# Patient Record
Sex: Female | Born: 1940 | Race: White | Hispanic: No | Marital: Married | Smoking: Never smoker
Health system: Southern US, Community
[De-identification: ages and names within clinical notes are randomized; demographics above are authoritative.]

## PROBLEM LIST (undated history)

## (undated) DIAGNOSIS — I10 Essential (primary) hypertension: Secondary | ICD-10-CM

## (undated) DIAGNOSIS — C439 Malignant melanoma of skin, unspecified: Secondary | ICD-10-CM

## (undated) DIAGNOSIS — Q8502 Neurofibromatosis, type 2: Secondary | ICD-10-CM

## (undated) DIAGNOSIS — E785 Hyperlipidemia, unspecified: Secondary | ICD-10-CM

## (undated) DIAGNOSIS — D333 Benign neoplasm of cranial nerves: Secondary | ICD-10-CM

## (undated) DIAGNOSIS — Z8619 Personal history of other infectious and parasitic diseases: Secondary | ICD-10-CM

## (undated) HISTORY — PX: COLON SURGERY: SHX602

## (undated) HISTORY — DX: Malignant melanoma of skin, unspecified: C43.9

## (undated) HISTORY — PX: GYNECOLOGIC CRYOSURGERY: SHX857

## (undated) HISTORY — PX: FACIAL COSMETIC SURGERY: SHX629

## (undated) HISTORY — DX: Essential (primary) hypertension: I10

## (undated) HISTORY — PX: COLONOSCOPY: SHX174

---

## 1979-02-16 HISTORY — PX: OTHER SURGICAL HISTORY: SHX169

## 1988-02-16 HISTORY — PX: BREAST ENHANCEMENT SURGERY: SHX7

## 1997-08-15 ENCOUNTER — Other Ambulatory Visit: Admission: RE | Admit: 1997-08-15 | Discharge: 1997-08-15 | Payer: Self-pay | Admitting: *Deleted

## 1998-08-21 ENCOUNTER — Other Ambulatory Visit: Admission: RE | Admit: 1998-08-21 | Discharge: 1998-08-21 | Payer: Self-pay | Admitting: *Deleted

## 1999-12-17 ENCOUNTER — Other Ambulatory Visit: Admission: RE | Admit: 1999-12-17 | Discharge: 1999-12-17 | Payer: Self-pay | Admitting: *Deleted

## 2001-03-23 ENCOUNTER — Other Ambulatory Visit: Admission: RE | Admit: 2001-03-23 | Discharge: 2001-03-23 | Payer: Self-pay | Admitting: *Deleted

## 2002-05-31 ENCOUNTER — Other Ambulatory Visit: Admission: RE | Admit: 2002-05-31 | Discharge: 2002-05-31 | Payer: Self-pay | Admitting: *Deleted

## 2002-06-14 ENCOUNTER — Encounter: Payer: Self-pay | Admitting: *Deleted

## 2002-06-14 ENCOUNTER — Encounter: Admission: RE | Admit: 2002-06-14 | Discharge: 2002-06-14 | Payer: Self-pay | Admitting: *Deleted

## 2003-06-27 ENCOUNTER — Other Ambulatory Visit: Admission: RE | Admit: 2003-06-27 | Discharge: 2003-06-27 | Payer: Self-pay | Admitting: *Deleted

## 2003-07-17 ENCOUNTER — Encounter: Admission: RE | Admit: 2003-07-17 | Discharge: 2003-07-17 | Payer: Self-pay | Admitting: *Deleted

## 2004-08-20 ENCOUNTER — Other Ambulatory Visit: Admission: RE | Admit: 2004-08-20 | Discharge: 2004-08-20 | Payer: Self-pay | Admitting: *Deleted

## 2004-09-09 ENCOUNTER — Encounter: Admission: RE | Admit: 2004-09-09 | Discharge: 2004-09-09 | Payer: Self-pay | Admitting: *Deleted

## 2005-11-04 ENCOUNTER — Other Ambulatory Visit: Admission: RE | Admit: 2005-11-04 | Discharge: 2005-11-04 | Payer: Self-pay | Admitting: *Deleted

## 2005-12-10 ENCOUNTER — Encounter: Admission: RE | Admit: 2005-12-10 | Discharge: 2005-12-10 | Payer: Self-pay | Admitting: *Deleted

## 2006-02-15 DIAGNOSIS — C439 Malignant melanoma of skin, unspecified: Secondary | ICD-10-CM

## 2006-02-15 HISTORY — DX: Malignant melanoma of skin, unspecified: C43.9

## 2006-12-29 ENCOUNTER — Other Ambulatory Visit: Admission: RE | Admit: 2006-12-29 | Discharge: 2006-12-29 | Payer: Self-pay | Admitting: *Deleted

## 2007-02-28 ENCOUNTER — Encounter: Admission: RE | Admit: 2007-02-28 | Discharge: 2007-02-28 | Payer: Self-pay | Admitting: *Deleted

## 2008-01-04 ENCOUNTER — Other Ambulatory Visit: Admission: RE | Admit: 2008-01-04 | Discharge: 2008-01-04 | Payer: Self-pay | Admitting: Gynecology

## 2008-06-18 ENCOUNTER — Encounter: Admission: RE | Admit: 2008-06-18 | Discharge: 2008-06-18 | Payer: Self-pay | Admitting: Internal Medicine

## 2009-02-17 LAB — HM PAP SMEAR: HM Pap smear: NORMAL

## 2009-06-20 LAB — HM COLONOSCOPY: HM Colonoscopy: ABNORMAL

## 2009-06-25 LAB — HM MAMMOGRAPHY: HM Mammogram: NORMAL

## 2009-07-02 ENCOUNTER — Encounter: Admission: RE | Admit: 2009-07-02 | Discharge: 2009-07-02 | Payer: Self-pay | Admitting: Internal Medicine

## 2010-09-22 ENCOUNTER — Encounter: Payer: Self-pay | Admitting: Internal Medicine

## 2010-10-31 ENCOUNTER — Encounter: Payer: Self-pay | Admitting: Internal Medicine

## 2011-03-02 ENCOUNTER — Other Ambulatory Visit: Payer: Self-pay | Admitting: *Deleted

## 2011-03-02 NOTE — Telephone Encounter (Signed)
Faxed requests from [facility] [address]  Pt has not been seen here and she doesn't have any upcoming appts.

## 2011-03-03 MED ORDER — TRIAMTERENE-HCTZ 37.5-25 MG PO TABS: 1.0000 | ORAL_TABLET | Freq: Every day | ORAL | Status: DC

## 2011-03-03 MED ORDER — LOSARTAN POTASSIUM 100 MG PO TABS: 100.0000 mg | ORAL_TABLET | Freq: Every day | ORAL | Status: DC

## 2011-04-22 ENCOUNTER — Telehealth: Payer: Self-pay | Admitting: *Deleted

## 2011-04-22 NOTE — Telephone Encounter (Signed)
Patient is asking if she can get an order for a bone density faxed to [place]. She says that you had recommended that she have this done last year, but she never went.

## 2011-04-22 NOTE — Telephone Encounter (Signed)
I will, but I ask that she schedule a visit first to discuss the results and followup on her hypertension . She has not been seen here , which means she is overdue for labs and 6 month followup.

## 2011-04-26 NOTE — Telephone Encounter (Signed)
Left message asking patient to return my call.

## 2011-05-06 NOTE — Telephone Encounter (Signed)
Left message asking patient to call and schedule appt.

## 2011-06-10 ENCOUNTER — Encounter: Payer: Self-pay | Admitting: Internal Medicine

## 2011-06-11 LAB — HM DEXA SCAN

## 2011-08-24 ENCOUNTER — Telehealth: Payer: Self-pay | Admitting: Internal Medicine

## 2011-08-24 NOTE — Telephone Encounter (Signed)
Per note back in march Dr. Darrick Huntsman wants to see patient before the bone density. Patient advised and has made appt

## 2011-08-24 NOTE — Telephone Encounter (Signed)
Can patient go ahead and get bone density scan set up?

## 2011-08-30 ENCOUNTER — Other Ambulatory Visit: Payer: Self-pay | Admitting: *Deleted

## 2011-08-30 DIAGNOSIS — H524 Presbyopia: Secondary | ICD-10-CM | POA: Diagnosis not present

## 2011-08-30 DIAGNOSIS — H2510 Age-related nuclear cataract, unspecified eye: Secondary | ICD-10-CM | POA: Diagnosis not present

## 2011-08-31 MED ORDER — PROMETHAZINE HCL 12.5 MG PO TABS: 12.5000 mg | ORAL_TABLET | Freq: Four times a day (QID) | ORAL | Status: DC | PRN

## 2011-09-06 DIAGNOSIS — H2510 Age-related nuclear cataract, unspecified eye: Secondary | ICD-10-CM | POA: Diagnosis not present

## 2011-09-06 DIAGNOSIS — H18419 Arcus senilis, unspecified eye: Secondary | ICD-10-CM | POA: Diagnosis not present

## 2011-10-04 DIAGNOSIS — Z961 Presence of intraocular lens: Secondary | ICD-10-CM | POA: Diagnosis not present

## 2011-10-04 DIAGNOSIS — H2510 Age-related nuclear cataract, unspecified eye: Secondary | ICD-10-CM | POA: Diagnosis not present

## 2011-10-04 DIAGNOSIS — H269 Unspecified cataract: Secondary | ICD-10-CM | POA: Diagnosis not present

## 2011-10-05 DIAGNOSIS — H2510 Age-related nuclear cataract, unspecified eye: Secondary | ICD-10-CM | POA: Diagnosis not present

## 2011-10-13 ENCOUNTER — Other Ambulatory Visit (HOSPITAL_COMMUNITY)
Admission: RE | Admit: 2011-10-13 | Discharge: 2011-10-13 | Disposition: A | Discharge status: Home / Self Care | Payer: Medicare Other | Source: Ambulatory Visit | Attending: Internal Medicine | Admitting: Internal Medicine

## 2011-10-13 ENCOUNTER — Encounter: Payer: Self-pay | Admitting: Internal Medicine

## 2011-10-13 ENCOUNTER — Ambulatory Visit (INDEPENDENT_AMBULATORY_CARE_PROVIDER_SITE_OTHER): Payer: Medicare Other | Admitting: Internal Medicine

## 2011-10-13 VITALS — BP 120/62 | HR 72 | Temp 97.8°F | Resp 14 | Ht 63.0 in | Wt 128.8 lb

## 2011-10-13 DIAGNOSIS — E785 Hyperlipidemia, unspecified: Secondary | ICD-10-CM | POA: Diagnosis not present

## 2011-10-13 DIAGNOSIS — Z1151 Encounter for screening for human papillomavirus (HPV): Secondary | ICD-10-CM | POA: Diagnosis not present

## 2011-10-13 DIAGNOSIS — R5381 Other malaise: Secondary | ICD-10-CM | POA: Diagnosis not present

## 2011-10-13 DIAGNOSIS — R5383 Other fatigue: Secondary | ICD-10-CM

## 2011-10-13 DIAGNOSIS — Z124 Encounter for screening for malignant neoplasm of cervix: Secondary | ICD-10-CM

## 2011-10-13 DIAGNOSIS — Z13820 Encounter for screening for osteoporosis: Secondary | ICD-10-CM

## 2011-10-13 DIAGNOSIS — Z01419 Encounter for gynecological examination (general) (routine) without abnormal findings: Secondary | ICD-10-CM | POA: Diagnosis not present

## 2011-10-13 DIAGNOSIS — Z0000 Encounter for general adult medical examination without abnormal findings: Secondary | ICD-10-CM | POA: Diagnosis not present

## 2011-10-13 DIAGNOSIS — E559 Vitamin D deficiency, unspecified: Secondary | ICD-10-CM

## 2011-10-13 DIAGNOSIS — M810 Age-related osteoporosis without current pathological fracture: Secondary | ICD-10-CM

## 2011-10-13 DIAGNOSIS — Z1239 Encounter for other screening for malignant neoplasm of breast: Secondary | ICD-10-CM | POA: Diagnosis not present

## 2011-10-13 DIAGNOSIS — E871 Hypo-osmolality and hyponatremia: Secondary | ICD-10-CM

## 2011-10-13 DIAGNOSIS — E860 Dehydration: Secondary | ICD-10-CM

## 2011-10-13 DIAGNOSIS — Z23 Encounter for immunization: Secondary | ICD-10-CM

## 2011-10-13 LAB — COMPREHENSIVE METABOLIC PANEL
ALT: 13 U/L (ref 0–35)
AST: 22 U/L (ref 0–37)
Albumin: 4.3 g/dL (ref 3.5–5.2)
Alkaline Phosphatase: 50 U/L (ref 39–117)
BUN: 11 mg/dL (ref 6–23)
CO2: 27 mEq/L (ref 19–32)
Calcium: 9.1 mg/dL (ref 8.4–10.5)
Chloride: 91 mEq/L — ABNORMAL LOW (ref 96–112)
Creatinine, Ser: 0.7 mg/dL (ref 0.4–1.2)
GFR: 88.91 mL/min (ref >60.00)
Glucose, Bld: 84 mg/dL (ref 70–99)
Potassium: 3.7 mEq/L (ref 3.5–5.1)
Sodium: 128 mEq/L — ABNORMAL LOW (ref 135–145)
Total Bilirubin: 0.9 mg/dL (ref 0.3–1.2)
Total Protein: 6.9 g/dL (ref 6.0–8.3)

## 2011-10-13 LAB — LDL CHOLESTEROL, DIRECT: Direct LDL: 147.2 mg/dL

## 2011-10-13 LAB — TSH: TSH: 0.49 u[IU]/mL (ref 0.35–5.50)

## 2011-10-13 LAB — LIPID PANEL
Cholesterol: 242 mg/dL — ABNORMAL HIGH (ref 0–200)
HDL: 90 mg/dL (ref >39.00)
Total CHOL/HDL Ratio: 3
Triglycerides: 61 mg/dL (ref 0.0–149.0)
VLDL: 12.2 mg/dL (ref 0.0–40.0)

## 2011-10-13 NOTE — Progress Notes (Signed)
Patient ID: Rita Taylor, female   DOB: 1940-09-09, 71 y.o.   MRN: [id number] The patient is here for annual Medicare wellness examination and management of other chronic and acute problems.   The risk factors are reflected in the social history.  The roster of all physicians providing medical care to patient - is listed in the Snapshot section of the chart.  Activities of daily living:  The patient is 100% independent in all ADLs: dressing, toileting, feeding as well as independent mobility  Home safety : The patient has smoke detectors in the home. They wear seatbelts.  There are no firearms at home. There is no violence in the home.   There is no risks for hepatitis, STDs or HIV. There is no   history of blood transfusion. They have no travel history to infectious disease endemic areas of the world.  The patient has seen their dentist in the last six month. They have seen their eye doctor in the last year. They admit to slight hearing difficulty with regard to whispered voices and some television programs.  They have deferred audiologic testing in the last year.  They do not  have excessive sun exposure. Discussed the need for sun protection: hats, long sleeves and use of sunscreen if there is significant sun exposure.   Diet: the importance of a healthy diet is discussed. They do have a healthy diet.  The benefits of regular aerobic exercise were discussed. She walks 4 times per week ,  20 minutes.   Depression screen: there are no signs or vegative symptoms of depression- irritability, change in appetite, anhedonia, sadness/tearfullness.  Cognitive assessment: the patient manages all their financial and personal affairs and is actively engaged. They could relate day,date,year and events; recalled 2/3 objects at 3 minutes; performed clock-face test normally.  The following portions of the patient's history were reviewed and updated as appropriate: allergies, current medications, past  family history, past medical history,  past surgical history, past social history  and problem list.  Visual acuity was not assessed per patient preference since she has regular follow up with her ophthalmologist. Hearing and body mass index were assessed and reviewed.   During the course of the visit the patient was educated and counseled about appropriate screening and preventive services including : fall prevention , diabetes screening, nutrition counseling, colorectal cancer screening, and recommended immunizations.     BP 120/62  Pulse 72  Temp 97.8 F (36.6 C) (Oral)  Resp 14  Ht 5\' 3"  (1.6 m)  Wt 128 lb 12 oz (58.401 kg)  BMI 22.81 kg/m2  SpO2 97%  General Appearance:    Alert, cooperative, no distress, appears stated age  Head:    Normocephalic, without obvious abnormality, atraumatic  Eyes:    PERRL, conjunctiva/corneas clear, EOM's intact, fundi    benign, both eyes  Ears:    Normal TM's and external ear canals, both ears  Nose:   Nares normal, septum midline, mucosa normal, no drainage    or sinus tenderness  Throat:   Lips, mucosa, and tongue normal; teeth and gums normal  Neck:   Supple, symmetrical, trachea midline, no adenopathy;    thyroid:  no enlargement/tenderness/nodules; no carotid   bruit or JVD  Back:     Symmetric, no curvature, ROM normal, no CVA tenderness  Lungs:     Clear to auscultation bilaterally, respirations unlabored  Chest Wall:    No tenderness or deformity   Heart:    Regular rate  and rhythm, S1 and S2 normal, no murmur, rub   or gallop  Breast Exam:    No tenderness, masses, or nipple abnormality  Abdomen:     Soft, non-tender, bowel sounds active all four quadrants,    no masses, no organomegaly  Genitalia:    Normal female without lesion, discharge or tenderness     Extremities:   Extremities normal, atraumatic, no cyanosis or edema  Pulses:   2+ and symmetric all extremities  Skin:   Skin color, texture, turgor normal, no rashes or  lesions  Lymph nodes:   Cervical, supraclavicular, and axillary nodes normal  Neurologic:   CNII-XII intact, normal strength, sensation and reflexes    throughout    Screening for cervical cancer Pelvic and Pap smear were done today. Exam was normal.  Routine general medical examination at a health care facility Her annual exam was done today and she was brought up to date on all screenings. Her lipid panel is excellen, thyroid function and vitamin D level was normal. She was slightly hyponatremic suggesting mild dehydration. I will have her come back in a week or so when well hydrated to make sure this has resolved.    Updated Medication List Outpatient Encounter Prescriptions as of 10/13/2011  Medication Sig Dispense Refill  . alendronate (FOSAMAX) 70 MG tablet Take 70 mg by mouth once a week. Take with a full glass of water on an empty stomach.       Marland Kitchen aspirin 81 MG tablet Take 81 mg by mouth daily.        . Calcium Carbonate-Vit D-Min (CALCIUM 1200) 1200-1000 MG-UNIT CHEW Chew 1 tablet by mouth daily.        Marland Kitchen losartan (COZAAR) 100 MG tablet Take 1 tablet (100 mg total) by mouth daily.  90 tablet  3  . triamterene-hydrochlorothiazide (MAXZIDE-25) 37.5-25 MG per tablet Take 1 each (1 tablet total) by mouth daily.  90 tablet  3  . DISCONTD: Inositol Niacinate (NIACIN FLUSH FREE) 500 MG CAPS Take 1 capsule by mouth daily.        Marland Kitchen DISCONTD: promethazine (PHENERGAN) 12.5 MG tablet Take 1 tablet (12.5 mg total) by mouth every 6 (six) hours as needed for nausea.  20 tablet  0

## 2011-10-13 NOTE — Patient Instructions (Addendum)
We will call you with resutls of your tests

## 2011-10-14 DIAGNOSIS — Z124 Encounter for screening for malignant neoplasm of cervix: Secondary | ICD-10-CM | POA: Insufficient documentation

## 2011-10-14 DIAGNOSIS — Z0000 Encounter for general adult medical examination without abnormal findings: Secondary | ICD-10-CM | POA: Insufficient documentation

## 2011-10-14 NOTE — Assessment & Plan Note (Signed)
Her annual exam was done today and she was brought up to date on all screenings.

## 2011-10-14 NOTE — Assessment & Plan Note (Signed)
Pelvic and Pap smear were done today. Exam was normal.

## 2011-10-28 ENCOUNTER — Other Ambulatory Visit (INDEPENDENT_AMBULATORY_CARE_PROVIDER_SITE_OTHER): Payer: Medicare Other | Admitting: *Deleted

## 2011-10-28 DIAGNOSIS — E860 Dehydration: Secondary | ICD-10-CM

## 2011-10-28 DIAGNOSIS — E871 Hypo-osmolality and hyponatremia: Secondary | ICD-10-CM

## 2011-10-29 LAB — BASIC METABOLIC PANEL
Calcium: 8.5 mg/dL (ref 8.4–10.5)
GFR: 72.84 mL/min (ref >60.00)
Glucose, Bld: 157 mg/dL — ABNORMAL HIGH (ref 70–99)
Potassium: 3.8 mEq/L (ref 3.5–5.1)
Sodium: 128 mEq/L — ABNORMAL LOW (ref 135–145)

## 2011-11-01 DIAGNOSIS — Z961 Presence of intraocular lens: Secondary | ICD-10-CM | POA: Diagnosis not present

## 2011-11-01 DIAGNOSIS — H2510 Age-related nuclear cataract, unspecified eye: Secondary | ICD-10-CM | POA: Diagnosis not present

## 2011-11-01 DIAGNOSIS — H269 Unspecified cataract: Secondary | ICD-10-CM | POA: Diagnosis not present

## 2011-11-11 ENCOUNTER — Ambulatory Visit
Admission: RE | Admit: 2011-11-11 | Discharge: 2011-11-11 | Disposition: A | Discharge status: Home / Self Care | Payer: Medicare Other | Source: Ambulatory Visit | Attending: Internal Medicine | Admitting: Internal Medicine

## 2011-11-11 DIAGNOSIS — Z1239 Encounter for other screening for malignant neoplasm of breast: Secondary | ICD-10-CM

## 2011-11-11 DIAGNOSIS — Z1231 Encounter for screening mammogram for malignant neoplasm of breast: Secondary | ICD-10-CM | POA: Diagnosis not present

## 2011-11-11 DIAGNOSIS — M949 Disorder of cartilage, unspecified: Secondary | ICD-10-CM | POA: Diagnosis not present

## 2011-11-11 DIAGNOSIS — M899 Disorder of bone, unspecified: Secondary | ICD-10-CM | POA: Diagnosis not present

## 2011-11-11 DIAGNOSIS — M810 Age-related osteoporosis without current pathological fracture: Secondary | ICD-10-CM

## 2011-11-12 ENCOUNTER — Telehealth: Payer: Self-pay | Admitting: Internal Medicine

## 2011-11-12 ENCOUNTER — Other Ambulatory Visit (INDEPENDENT_AMBULATORY_CARE_PROVIDER_SITE_OTHER): Payer: Medicare Other

## 2011-11-12 ENCOUNTER — Encounter: Payer: Self-pay | Admitting: Internal Medicine

## 2011-11-12 DIAGNOSIS — E860 Dehydration: Secondary | ICD-10-CM

## 2011-11-12 DIAGNOSIS — E871 Hypo-osmolality and hyponatremia: Secondary | ICD-10-CM | POA: Diagnosis not present

## 2011-11-12 DIAGNOSIS — I10 Essential (primary) hypertension: Secondary | ICD-10-CM

## 2011-11-12 LAB — BASIC METABOLIC PANEL
CO2: 28 mEq/L (ref 19–32)
Chloride: 102 mEq/L (ref 96–112)
Potassium: 3.6 mEq/L (ref 3.5–5.1)
Sodium: 137 mEq/L (ref 135–145)

## 2011-11-12 LAB — HM MAMMOGRAPHY: HM Mammogram: NORMAL

## 2011-11-12 NOTE — Telephone Encounter (Signed)
Patient came in for a repeat BMET, and I checked her BP it was 176/84.  She had just checked it at [facility] and it was 146/80.  You stated if her BP was elevated after coming off the triamterene-hctz you would prescribe another medication in its place.  Please advise.

## 2011-11-13 DIAGNOSIS — R42 Dizziness and giddiness: Secondary | ICD-10-CM | POA: Diagnosis not present

## 2011-11-13 DIAGNOSIS — I10 Essential (primary) hypertension: Secondary | ICD-10-CM | POA: Diagnosis not present

## 2011-11-15 ENCOUNTER — Ambulatory Visit (INDEPENDENT_AMBULATORY_CARE_PROVIDER_SITE_OTHER): Payer: Medicare Other | Admitting: Internal Medicine

## 2011-11-15 ENCOUNTER — Encounter: Payer: Self-pay | Admitting: Internal Medicine

## 2011-11-15 VITALS — BP 130/60 | HR 75 | Temp 98.5°F | Wt 128.0 lb

## 2011-11-15 DIAGNOSIS — E871 Hypo-osmolality and hyponatremia: Secondary | ICD-10-CM | POA: Diagnosis not present

## 2011-11-15 DIAGNOSIS — I10 Essential (primary) hypertension: Secondary | ICD-10-CM | POA: Insufficient documentation

## 2011-11-15 DIAGNOSIS — Z23 Encounter for immunization: Secondary | ICD-10-CM | POA: Diagnosis not present

## 2011-11-15 MED ORDER — AMLODIPINE BESYLATE 2.5 MG PO TABS: 2.5000 mg | ORAL_TABLET | Freq: Every day | ORAL | Status: DC

## 2011-11-15 NOTE — Telephone Encounter (Signed)
Yes the Maxzide was stopped because of hyponatremic because because her sodium to drop we will start amlodipine 2.5 mg one tablet daily.  Epic has been updated.

## 2011-11-15 NOTE — Patient Instructions (Addendum)
You can resume the triamterene/hctz  And recheck your bp in one week.. If not below 130/80,  Stop it and start the amlodipine.  If bp is fine, and you continue  the triamterene, return for labs in 1 more week,

## 2011-11-15 NOTE — Assessment & Plan Note (Addendum)
Maxzide was stopped secondary to hyponatremia. She requires an additional medication because her blood pressures are still elevated on losartan alone. Amlodipine 2.5 mg daily started.

## 2011-11-15 NOTE — Assessment & Plan Note (Addendum)
uncontrolled since stopping hydrochlorothiazide/triamterene. She resumed it this weekend for blood pressure 180/100. Today's repeat blood pressure is 130/60. She would like to continue triamterene HCTZ and repeat her sodium level after altering the amount of green tea and Diet Coke that she's been taking  on a daily basis. Amlodipine 2.5 mg prn bp > 150.

## 2011-11-15 NOTE — Assessment & Plan Note (Signed)
Secondary to maxzide and concurrent use of green tea. Will repeat  bmet after reducing green tea consumption per patient request

## 2011-11-15 NOTE — Progress Notes (Signed)
Patient ID: Rita Taylor, female   DOB: [birth year], 71 y.o.   MRN: [id number]  Patient Active Problem List  Diagnosis  . Routine general medical examination at a health care facility  . Screening for cervical cancer  . Hypertension    Subjective:  CC:   Chief Complaint  Patient presents with  . Follow-up    HPI:   Rita Taylor a 71 y.o. female who presents  For Follow up on uncontrolled hypertension,  maxzide was stopped after repeat BMET confirmed hyponatremia and hypochloemia consistent with dehydration secondary to diuretic.She returned last Friday for a bp check  Which noted elevated bo and was was told by CNA to go to Urgent Care.  Over the weekend she had a dizzy presyncopal episode and went to Urgent Care for bp 180/95.  She has resumed maxzide over the weekend. She has realized that her daily intake of green tea and  Diet coke is excessive.   Past Medical History  Diagnosis Date  . Melanoma 2008    removed by [facility], Wider excision by [facility] left flank  . Hypertension     Past Surgical History  Procedure Date  . Gynecologic cryosurgery     15 years ago, normal since then, was treated with antibiotics, Annual pap smears for the past 20 years  . Breast enhancement surgery 1990  . Bitubal ligation 1981         The following portions of the patient's history were reviewed and updated as appropriate: Allergies, current medications, and problem list.    Review of Systems:   12 Pt  review of systems was negative except those addressed in the HPI,     History   Social History  . Marital Status: Married    Spouse Name: N/A    Number of Children: N/A  . Years of Education: N/A   Occupational History  . Not on file.   Social History Main Topics  . Smoking status: Never Smoker   . Smokeless tobacco: Never Used  . Alcohol Use: Yes     moderate One glass daily  . Drug Use: No  . Sexually Active: Not on file   Other Topics Concern  . 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 ROM normal. No CVA tenderness. Lungs: clear to auscultation bilaterally Heart: regular rate and rhythm, S1, S2 normal, no murmur, click, rub or gallop Abdomen: soft, non-tender; bowel sounds normal; no masses,  no organomegaly Pulses: 2+ and symmetric Skin: Skin color, texture, turgor normal. No rashes or lesions Lymph nodes: Cervical, supraclavicular, and axillary nodes normal.  Assessment and Plan:  Hypertension uncontrolled since stopping hydrochlorothiazide/triamterene. She resumed it this weekend for blood pressure 180/100. Today's repeat blood pressure is 130/60. She would like to continue triamterene HCTZ and repeat her sodium level after altering the amount of green tea and Diet Coke that she's been taking  on a daily basis. Amlodipine 2.5 mg prn bp > 150.   Hyponatremia Secondary to maxzide and concurrent use of green tea. Will repeat  bmet after reducing green tea consumption per patient request   Updated Medication List Outpatient Encounter Prescriptions as of 11/15/2011  Medication Sig Dispense Refill  . alendronate (FOSAMAX) 70 MG tablet Take 70 mg by mouth once a week. Take with a full glass of water on an empty stomach.       Marland Kitchen amLODipine (NORVASC) 2.5 MG tablet Take 1 tablet (2.5 mg total) by mouth daily.  30 tablet  3  . aspirin 81 MG tablet Take 81 mg by  mouth daily.        . Calcium Carbonate-Vit D-Min (CALCIUM 1200) 1200-1000 MG-UNIT CHEW Chew 1 tablet by mouth daily.        Marland Kitchen losartan (COZAAR) 100 MG tablet Take 1 tablet (100 mg total) by mouth daily.  90 tablet  3  . triamterene-hydrochlorothiazide (MAXZIDE-25) 37.5-25 MG per tablet Take 1 each (1 tablet total) by mouth daily.  90 tablet  3  . DISCONTD: amLODipine (NORVASC) 2.5 MG tablet Take 1 tablet (2.5 mg total) by mouth daily.  90 tablet  3

## 2011-11-16 NOTE — Telephone Encounter (Signed)
I have attempted to contact this patient by phone with the following results: left message to return my call on answering machine.

## 2011-11-22 NOTE — Telephone Encounter (Signed)
Ms Altic was seen on 9/30 by Dr. Darrick Huntsman with the instructions given below:  You can resume the triamterene/hctz And recheck your bp in one week.. If not below 130/80, Stop it and start the amlodipine.  If bp is fine, and you continue the triamterene, return for labs in 1 more week,

## 2011-12-01 ENCOUNTER — Telehealth: Payer: Self-pay | Admitting: Internal Medicine

## 2011-12-01 NOTE — Telephone Encounter (Signed)
Caller: Kareli/Patient; Patient Name: Rita Taylor; PCP: Duncan Dull (Adults only); Best Callback Phone Number: [phone].  Call regarding elevated BP after starting Amlodipine on 9-30.  BP 152/77 pm 10-16. Pt has slight dizziness. Pt was taken off of Maxzide.  All emergent symptoms ruled out per Hypertension protocol,see in 24 hrs due to elevation in BP and recently changed prescription.  Pt uses [facility], [place], [phone].  Please review w/ MD and follow up w/ Pt. May leave info on vmail per Pt.

## 2011-12-02 ENCOUNTER — Telehealth: Payer: Self-pay | Admitting: Internal Medicine

## 2011-12-02 NOTE — Telephone Encounter (Signed)
Spoke to patient about increasing her amlodipine to 5 mg daily, she stated that she is still taking the losartan also.

## 2011-12-02 NOTE — Telephone Encounter (Signed)
She does not need to be seen if there is no poor appointment available. Please tell her to increase her amlodipine dose to 5 mg daily. She's currently taking 2.5 mg daily have her blood pressure rechecked in 48 hours.

## 2011-12-02 NOTE — Telephone Encounter (Signed)
Caller: Meital/Patient; Patient Name: Rita Taylor; PCP: Duncan Dull (Adults only); Best Callback Phone Number: [phone]. States that she called and spoke with a nurse yesterday, 12/01/11 at 1245PM regarding her Amlodipine and was told that someone would call her back. States that she has not spoken to anyone unless they called and it didn't show up on her phone. Has been on this medication for 8 days. Verified in EPIC that Dr. Darrick Huntsman gave orders for patient to increase her Amlodipine to 5MG  PO Daily instead of 2.5MG  she was previously on. Also instructs patient to have her blood pressure rechecked in 48 hours. Patient agrees.

## 2011-12-08 ENCOUNTER — Other Ambulatory Visit: Payer: Self-pay | Admitting: *Deleted

## 2011-12-08 MED ORDER — ALENDRONATE SODIUM 70 MG PO TABS: 70.0000 mg | ORAL_TABLET | ORAL | Status: DC

## 2011-12-15 ENCOUNTER — Telehealth: Payer: Self-pay | Admitting: Internal Medicine

## 2011-12-15 ENCOUNTER — Other Ambulatory Visit: Payer: Self-pay | Admitting: Internal Medicine

## 2011-12-15 DIAGNOSIS — I10 Essential (primary) hypertension: Secondary | ICD-10-CM

## 2011-12-15 MED ORDER — AMLODIPINE BESYLATE 10 MG PO TABS: 10.0000 mg | ORAL_TABLET | Freq: Every day | ORAL | Status: DC

## 2011-12-15 NOTE — Telephone Encounter (Signed)
Pt was calling and saying that her b/p meds were not working and was wanting to know if she should try another medication all together or decreasing or increasing teh dosage again. Her B/P is running pretty high.

## 2011-12-15 NOTE — Telephone Encounter (Signed)
Have her increase the amlodipine to 10 mg daily.  continue losartan 100 mg daily  .  If bp is not 130/80 or less  in one week, call back to  let me know. New rx sent to [facility]

## 2011-12-16 ENCOUNTER — Other Ambulatory Visit: Payer: Self-pay

## 2011-12-16 NOTE — Telephone Encounter (Signed)
Spoke to patient to give her instructions she insisted to make an appt.

## 2011-12-17 ENCOUNTER — Ambulatory Visit (INDEPENDENT_AMBULATORY_CARE_PROVIDER_SITE_OTHER): Payer: Medicare Other | Admitting: Internal Medicine

## 2011-12-17 ENCOUNTER — Encounter: Payer: Self-pay | Admitting: Internal Medicine

## 2011-12-17 VITALS — BP 142/68 | HR 88 | Temp 98.4°F | Ht 63.0 in | Wt 129.5 lb

## 2011-12-17 DIAGNOSIS — Z79899 Other long term (current) drug therapy: Secondary | ICD-10-CM | POA: Diagnosis not present

## 2011-12-17 DIAGNOSIS — I10 Essential (primary) hypertension: Secondary | ICD-10-CM | POA: Diagnosis not present

## 2011-12-17 DIAGNOSIS — E871 Hypo-osmolality and hyponatremia: Secondary | ICD-10-CM

## 2011-12-17 NOTE — Progress Notes (Signed)
Patient ID: Rita Taylor, female   DOB: 16-Oct-1940, 71 y.o.   MRN: [id number]  Patient Active Problem List  Diagnosis  . Routine general medical examination at a health care facility  . Screening for cervical cancer  . Hypertension  . Hyponatremia    Subjective:  CC:   Chief Complaint  Patient presents with  . Follow-up    labs    HPI:   Rita Taylor a 71 y.o. female who presents for follow up on hypertension. Her blood pressure has not been well controlled since stopping maxzide for hyponatremia (sodium 128)  Home bps still 140 to 150 on losartan and 2.5 mg amlodipine.  She did not not tolerate 5 mg of amlodipine bc it made her feel dizzy, so she resumed maxzide 8 days ago and bps are improving but not at goal yet. She has been making a concerted effort to increase her sodium intake    Past Medical History  Diagnosis Date  . Melanoma 2008    removed by [facility] Aloe, Wider excision by [facility] left flank  . Hypertension     Past Surgical History  Procedure Date  . Gynecologic cryosurgery     15 years ago, normal since then, was treated with antibiotics, Annual pap smears for the past 20 years  . Breast enhancement surgery 1990  . Bitubal ligation 1981         The following portions of the patient's history were reviewed and updated as appropriate: Allergies, current medications, and problem list.    Review of Systems:   12 Pt  review of systems was negative except those addressed in the HPI,     History   Social History  . Marital Status: Married    Spouse Name: N/A    Number of Children: N/A  . Years of Education: N/A   Occupational History  . Not on file.   Social History Main Topics  . Smoking status: Never Smoker   . Smokeless tobacco: Never Used  . Alcohol Use: Yes     Comment: moderate One glass daily  . Drug Use: No  . Sexually Active: Not on file   Other Topics Concern  . 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 ROM normal. No CVA tenderness. Lungs: clear to auscultation bilaterally Heart: regular rate and rhythm, S1, S2 normal, no murmur, click, rub or gallop Abdomen: soft, non-tender; bowel sounds normal; no masses,  no organomegaly Pulses: 2+ and symmetric Skin: Skin color, texture, turgor normal. No rashes or lesions Lymph nodes: Cervical, supraclavicular, and axillary nodes normal.  Assessment and Plan:  Hyponatremia Recurrent, secondary to maxzide.  Medication to be stopped again.   Hypertension Control has been difficult since Maxzide was stopped due to hyponatremia.  Did not tolerate amlodipine.  continue losartan,  trial of Bystolic.  Updated Medication List Outpatient Encounter Prescriptions as of 12/17/2011  Medication Sig Dispense Refill  . alendronate (FOSAMAX) 70 MG tablet Take 1 tablet (70 mg total) by mouth once a week. Take with a full glass of water on an empty stomach.  12 tablet  3  . amLODipine (NORVASC) 10 MG tablet Take 1 tablet (10 mg total) by mouth daily.  30 tablet  3  . aspirin 81 MG tablet Take 81 mg by mouth daily.        . Calcium Carbonate-Vit D-Min (CALCIUM 1200) 1200-1000 MG-UNIT CHEW Chew 1 tablet by mouth daily.        Marland Kitchen losartan (COZAAR) 100 MG tablet Take 1 tablet (100 mg total) by mouth daily.  90 tablet  3  . triamterene-hydrochlorothiazide  (MAXZIDE-25) 37.5-25 MG per tablet Take 1 each (1 tablet total) by mouth daily.  90 tablet  3     Orders Placed This Encounter  Procedures  . Basic metabolic panel    No Follow-up on file.

## 2011-12-17 NOTE — Patient Instructions (Signed)
continue maxzide and losartan for now.  Recheck sodium level today.    Your DEXA scan shows improved bone density

## 2011-12-18 LAB — BASIC METABOLIC PANEL
BUN: 16 mg/dL (ref 6–23)
CO2: 28 mEq/L (ref 19–32)
Chloride: 94 mEq/L — ABNORMAL LOW (ref 96–112)
Glucose, Bld: 93 mg/dL (ref 70–99)
Potassium: 4.1 mEq/L (ref 3.5–5.3)
Sodium: 132 mEq/L — ABNORMAL LOW (ref 135–145)

## 2011-12-19 ENCOUNTER — Encounter: Payer: Self-pay | Admitting: Internal Medicine

## 2011-12-19 MED ORDER — NEBIVOLOL HCL 5 MG PO TABS: 5.0000 mg | ORAL_TABLET | Freq: Every day | ORAL | Status: DC

## 2011-12-19 NOTE — Assessment & Plan Note (Signed)
Recurrent, secondary to maxzide.  Medication to be stopped again.

## 2011-12-19 NOTE — Assessment & Plan Note (Signed)
Control has been difficult since Maxzide was stopped due to hyponatremia.  Did not tolerate amlodipine.  continue losartan,  trial of Bystolic.

## 2011-12-21 MED ORDER — NEBIVOLOL HCL 5 MG PO TABS: 5.0000 mg | ORAL_TABLET | Freq: Every day | ORAL | Status: DC

## 2011-12-21 NOTE — Addendum Note (Signed)
Addended by: [name] on: 12/21/2011 05:32 PM   Modules accepted: Orders

## 2012-01-25 ENCOUNTER — Telehealth: Payer: Self-pay | Admitting: Internal Medicine

## 2012-01-25 DIAGNOSIS — I10 Essential (primary) hypertension: Secondary | ICD-10-CM

## 2012-01-25 NOTE — Telephone Encounter (Signed)
According to chart we stopped the maxzide again because it keeps dropping her sodium levels.  At last visit we added Bystolic.  Can she come in tomorrow for a BMET and a urine test so we have results for her visit on Thursday?

## 2012-01-25 NOTE — Telephone Encounter (Signed)
Patient Information:  Caller Name: Meiah  Phone: [phone]  Patient: Rita Taylor  Gender: Female  DOB: 1940-05-07  Age: 71 Years  PCP: Duncan Dull (Adults only)   Symptoms  Reason For Call & Symptoms: has appt 01/27/12 with Dr. Darrick Huntsman.  Blood pressure is changing.  Has not had changes in her blood pressure medication.  States her sodium started climbing in September and changed her BP medication.  States she takes maxide and losartan,  but BP is now 149/65 to 164/76.  Reviewed Health History In EMR: Yes  Reviewed Medications In EMR: Yes  Reviewed Allergies In EMR: Yes  Reviewed Surgeries / Procedures: Yes  Date of Onset of Symptoms: 01/11/2012  Guideline(s) Used:  High Blood Pressure  Disposition Per Guideline:   See Within 2 Weeks in Office  Reason For Disposition Reached:   BP > 160/100  Advice Given:  N/A  Office Follow Up:  Does the office need to follow up with this patient?: No  Instructions For The Office: N/A  Appointment Scheduled:  01/27/2012 10:45:00 Appointment Scheduled Provider:  Duncan Dull (Adults only)  RN Note:  Per protocol, emergent symptoms denied; advised appt; has appt 01/27/12.  krs/can

## 2012-01-26 ENCOUNTER — Encounter: Payer: Self-pay | Admitting: Adult Health

## 2012-01-26 ENCOUNTER — Ambulatory Visit (INDEPENDENT_AMBULATORY_CARE_PROVIDER_SITE_OTHER): Payer: Medicare Other | Admitting: Adult Health

## 2012-01-26 VITALS — BP 142/76 | HR 74 | Temp 98.2°F | Ht 63.0 in | Wt 128.0 lb

## 2012-01-26 DIAGNOSIS — I10 Essential (primary) hypertension: Secondary | ICD-10-CM | POA: Diagnosis not present

## 2012-01-26 LAB — BASIC METABOLIC PANEL
BUN: 13 mg/dL (ref 6–23)
CO2: 30 mEq/L (ref 19–32)
Chloride: 95 mEq/L — ABNORMAL LOW (ref 96–112)
Glucose, Bld: 103 mg/dL — ABNORMAL HIGH (ref 70–99)
Potassium: 4.2 mEq/L (ref 3.5–5.1)
Sodium: 135 mEq/L (ref 135–145)

## 2012-01-26 LAB — MICROALBUMIN / CREATININE URINE RATIO: Microalb Creat Ratio: 0.3 mg/g (ref 0.0–30.0)

## 2012-01-26 NOTE — Patient Instructions (Addendum)
  Please have your labs drawn prior to leaving the office today. We will call you with the results and further instructions this afternoon.  Remember, when you check your blood pressure, sit quietly for approximately 5 min prior to starting.   Please call us if you have any questions or concerns.

## 2012-01-26 NOTE — Assessment & Plan Note (Signed)
Rechecked B/P - 142/76. Patient feeling anxious about systolic B/P. She started back on the Maxzide she was on previously. Labs checked today (MetB, microalbumin). Awaiting results to determine if patient can continue Maxzide or need for new plan.

## 2012-01-26 NOTE — Progress Notes (Signed)
  Subjective:    Patient ID: Rita Taylor, female    DOB: 1940/11/21, 71 y.o.   MRN: [id number]  HPI  Patient is a very pleasant 71 y/o female who presents to clinic today for f/u of HTN. She was previously on maxzide with good control; however, sodium level dropped and she was changed to amlodipine in October. She reports that B/P stayed in the 150s/70s and she also experienced dizziness. Amlodipine was discontinued. She was then tried on bystolic in November with little response as well. Pt reports that she started herself back on the maxzide. She feels great on maxzide but reports that she is making herself sick over her systolic B/P being over 150. She checks it approximately 1/wk at [facility].  Current Outpatient Prescriptions on File Prior to Visit  Medication Sig Dispense Refill  . alendronate (FOSAMAX) 70 MG tablet Take 1 tablet (70 mg total) by mouth once a week. Take with a full glass of water on an empty stomach.  12 tablet  3  . aspirin 81 MG tablet Take 81 mg by mouth daily.        . Calcium Carbonate-Vit D-Min (CALCIUM 1200) 1200-1000 MG-UNIT CHEW Chew 1 tablet by mouth daily.        Marland Kitchen losartan (COZAAR) 100 MG tablet Take 1 tablet (100 mg total) by mouth daily.  90 tablet  3    Review of Systems  Constitutional: Negative for activity change, appetite change and fatigue.  HENT: Negative.   Respiratory: Negative for cough, chest tightness and shortness of breath.   Cardiovascular: Negative.   Gastrointestinal: Negative.   Neurological: Negative for dizziness, weakness and headaches.  Psychiatric/Behavioral: Negative.          Objective:   Physical Exam  Constitutional: She is oriented to person, place, and time. She appears well-developed and well-nourished. No distress.  HENT:  Right Ear: External ear normal.  Left Ear: External ear normal.  Nose: Nose normal.  Eyes: Right eye exhibits no discharge. Left eye exhibits no discharge. No scleral icterus.   Neck: No tracheal deviation present.  Cardiovascular: Normal rate, regular rhythm and normal heart sounds.   Pulmonary/Chest: Breath sounds normal. No respiratory distress. She has no wheezes.  Musculoskeletal: Normal range of motion. She exhibits no edema.  Neurological: She is alert and oriented to person, place, and time.  Skin: Skin is warm and dry.  Psychiatric: Her behavior is normal. Judgment and thought content normal.       Appears slightly anxious while discussing B/P. Became tearful.    BP 142/76  Pulse 74  Temp 98.2 F (36.8 C) (Oral)  Ht 5\' 3"  (1.6 m)  Wt 128 lb (58.06 kg)  BMI 22.67 kg/m2  SpO2 100%     Assessment & Plan:

## 2012-01-26 NOTE — Telephone Encounter (Signed)
Spoke to patient she stated that she has an appt on 11/26/11 with NP Rey.

## 2012-01-27 ENCOUNTER — Telehealth: Payer: Self-pay | Admitting: Adult Health

## 2012-01-27 ENCOUNTER — Ambulatory Visit: Payer: Medicare Other | Admitting: Internal Medicine

## 2012-01-27 NOTE — Telephone Encounter (Signed)
Telephone call to Ms. Kindley regarding her lab results. She had started herself back on her previous dose of Maxzide and has been on it for 2 weeks now. Sodium is normal. Advised pt to recheck B/P as she does normally and call with result. If B/P still not where it needs to be we could increase her Maxzide. Will also recheck her metabolic panel in January.

## 2012-02-01 ENCOUNTER — Telehealth: Payer: Self-pay | Admitting: Internal Medicine

## 2012-02-01 NOTE — Telephone Encounter (Signed)
BP reading 140/70. She is very pleased with that and appreciates your help.

## 2012-02-15 ENCOUNTER — Other Ambulatory Visit: Payer: Self-pay | Admitting: Internal Medicine

## 2012-02-15 MED ORDER — LOSARTAN POTASSIUM 100 MG PO TABS: 100.0000 mg | ORAL_TABLET | Freq: Every day | ORAL | Status: DC

## 2012-02-15 NOTE — Telephone Encounter (Signed)
Refill on Losartan Potassium 100 mg tablets 90 day supply

## 2012-02-15 NOTE — Telephone Encounter (Signed)
Med filled.  

## 2012-03-08 ENCOUNTER — Telehealth: Payer: Self-pay | Admitting: Internal Medicine

## 2012-03-08 MED ORDER — TRIAMTERENE-HCTZ 37.5-25 MG PO TABS: 1.0000 | ORAL_TABLET | Freq: Every day | ORAL | Status: DC

## 2012-03-08 NOTE — Telephone Encounter (Signed)
Med filled.  

## 2012-03-08 NOTE — Telephone Encounter (Signed)
triamterene-hydrochlorothiazide (MAXZIDE-25) 37.5-25 MG per tablet  ° °# 90 °

## 2012-05-09 ENCOUNTER — Telehealth: Payer: Self-pay | Admitting: General Practice

## 2012-05-09 NOTE — Telephone Encounter (Signed)
Pt called stating that she had talked to you back in December about her BP meds. Stated that her BP seems to be increasing and would like to increase her meds if possible. Range from high 140's and high 70's. Please advise.

## 2012-05-09 NOTE — Telephone Encounter (Signed)
We cannot prescribe or treat via telephone. Please let her know she needs to schedule an appt.

## 2012-05-11 NOTE — Telephone Encounter (Signed)
Called pt could not leave a message 

## 2012-05-12 ENCOUNTER — Encounter: Payer: Self-pay | Admitting: Internal Medicine

## 2012-05-12 ENCOUNTER — Ambulatory Visit (INDEPENDENT_AMBULATORY_CARE_PROVIDER_SITE_OTHER): Payer: Medicare Other | Admitting: Internal Medicine

## 2012-05-12 VITALS — BP 142/89 | HR 62 | Temp 98.1°F | Resp 17 | Wt 130.5 lb

## 2012-05-12 DIAGNOSIS — I10 Essential (primary) hypertension: Secondary | ICD-10-CM

## 2012-05-12 DIAGNOSIS — R0989 Other specified symptoms and signs involving the circulatory and respiratory systems: Secondary | ICD-10-CM

## 2012-05-12 NOTE — Progress Notes (Signed)
Patient ID: Rita Taylor, female   DOB: 03/30/1940, 72 y.o.   MRN: [id number]  Patient Active Problem List  Diagnosis  . Routine general medical examination at a health care facility  . Screening for cervical cancer  . Hypertension  . Hyponatremia    Subjective:  CC:   Chief Complaint  Patient presents with  . Hypertension    Patient has list of BP for past couple of days with her    HPI:   Rita Taylor a 72 y.o. female who presents for 4 month follow up on hypertension. Her blood pressure has been difficult to control since we stopped hydrochlorothiazide which was causing persistent hyponatremia. For the last several months she has been taking Maxzide and losartan and feels great. Followup basic metabolic panel revealed normal sodium levels. She is able to work in the yard without orthostasis. She has no headaches or muscle cramps. Her home blood pressures have noted some diastolic elevations. She notes that the more she checks it, the  higher the blood pressure readings are. She does note some anxiety centered around taking her blood pressure readings. She does not use over-the-counter nonsteroidal anti-inflammatories and no illicits.    Past Medical History  Diagnosis Date  . Melanoma 2008    removed by Orson Aloe, Wider excision by [facility] left flank  . Hypertension     Past Surgical History  Procedure Laterality Date  . Gynecologic cryosurgery      15 years ago, normal since then, was treated with antibiotics, Annual pap smears for the past 20 years  . Breast enhancement surgery  1990  . Bitubal ligation  1981    The following portions of the patient's history were reviewed and updated as appropriate: Allergies, current medications, and problem list.    Review of Systems:   Patient denies headache, fevers, malaise, unintentional weight loss, skin rash, eye pain, sinus congestion and sinus pain, sore throat, dysphagia,  hemoptysis , cough, dyspnea, wheezing,  chest pain, palpitations, orthopnea, edema, abdominal pain, nausea, melena, diarrhea, constipation, flank pain, dysuria, hematuria, urinary  Frequency, nocturia, numbness, tingling, seizures,  Focal weakness, Loss of consciousness,  Tremor, insomnia, depression, anxiety, and suicidal ideation.     History   Social History  . Marital Status: Married    Spouse Name: N/A    Number of Children: N/A  . Years of Education: N/A   Occupational History  . Not on file.   Social History Main Topics  . Smoking status: Never Smoker   . Smokeless tobacco: Never Used  . Alcohol Use: Yes     Comment: moderate One glass daily  . Drug Use: No  . Sexually Active: Not on file   Other Topics Concern  . Not on file   Social History Narrative   Lives with spouse.   Has 2 dogs and one cat, sold a horse farm years ago.   Exercises regularly 5 days a week at [employer] and also walks the dog    Objective:  BP 142/89  Pulse 62  Temp(Src) 98.1 F (36.7 C) (Oral)  Resp 17  Wt 130 lb 8 oz (59.194 kg)  BMI 23.12 kg/m2  SpO2 97%  General appearance: alert, cooperative and appears stated age Ears: normal TM's and external ear canals both ears Throat: lips, mucosa, and tongue normal; teeth and gums normal Neck: no adenopathy, no carotid bruit, supple, symmetrical, trachea midline and thyroid not enlarged, symmetric, no tenderness/mass/nodules Back: symmetric, no curvature. ROM normal. No CVA  tenderness. Lungs: clear to auscultation bilaterally Heart: regular rate and rhythm, S1, S2 normal, no murmur, click, rub or gallop Abdomen: soft, non-tender; bowel sounds normal; no masses,  no organomegaly Pulses: 2+ and symmetric Skin: Skin color, texture, turgor normal. No rashes or lesions Lymph nodes: Cervical, supraclavicular, and axillary nodes normal.  Assessment and Plan:  Hypertension Currently still elevated on current regimen. Unclear whether this is due to anxiety. We'll order a 24-hour BP  ambulatory monitor to check her blood pressure   To determine whether any medication changes need to be done.   Updated Medication List Outpatient Encounter Prescriptions as of 05/12/2012  Medication Sig Dispense Refill  . alendronate (FOSAMAX) 70 MG tablet Take 1 tablet (70 mg total) by mouth once a week. Take with a full glass of water on an empty stomach.  12 tablet  3  . aspirin 81 MG tablet Take 81 mg by mouth daily.        . Calcium Carbonate-Vit D-Min (CALCIUM 1200) 1200-1000 MG-UNIT CHEW Chew 1 tablet by mouth daily.        Marland Kitchen losartan (COZAAR) 100 MG tablet Take 1 tablet (100 mg total) by mouth daily.  90 tablet  3  . triamterene-hydrochlorothiazide (MAXZIDE-25) 37.5-25 MG per tablet Take 1 each (1 tablet total) by mouth daily.  30 tablet  6   No facility-administered encounter medications on file as of 05/12/2012.     Orders Placed This Encounter  Procedures  . Ambulatory referral to Cardiology    No Follow-up on file.

## 2012-05-14 ENCOUNTER — Encounter: Payer: Self-pay | Admitting: Internal Medicine

## 2012-05-14 NOTE — Assessment & Plan Note (Signed)
Currently still elevated on current regimen. Unclear whether this is due to anxiety. We'll order a 24-hour BP ambulatory monitor to check her blood pressure   To determine whether any medication changes need to be done.

## 2012-05-16 NOTE — Telephone Encounter (Signed)
Tried calling pt again. No answer.

## 2012-05-16 NOTE — Telephone Encounter (Signed)
Can you try scheduling pt?

## 2012-06-05 ENCOUNTER — Encounter: Payer: Self-pay | Admitting: Cardiovascular Disease

## 2012-06-05 ENCOUNTER — Ambulatory Visit (INDEPENDENT_AMBULATORY_CARE_PROVIDER_SITE_OTHER): Payer: Medicare Other | Admitting: Physician Assistant

## 2012-06-05 VITALS — BP 175/80 | HR 82 | Ht 63.0 in | Wt 129.2 lb

## 2012-06-05 DIAGNOSIS — I10 Essential (primary) hypertension: Secondary | ICD-10-CM | POA: Diagnosis not present

## 2012-06-05 NOTE — Assessment & Plan Note (Addendum)
Unclear whether the patient's blood pressure is truly uncontrolled- BP on arrival 175/80, end of visit 148/78. Suspect anxiety and white coat syndrome is driving much of this. Discussed reducing salt intake. Advised to take BP manually every 4 hours x 1 week at home after a period of inactivity, making sure to rest her arm at the level of her heart and while seated. She will call the office with these readings. We will also arrange 24-hour blood pressure monitor. Options for management should numbers return consistently elevated include adding an alternative BB with underlying anxiety or increasing Maxzide since she tolerates this well-- hesitant as she was hyponatremic in the past with this. Further work-up for secondary HTN would also be appropriate. Will see her back in 4 weeks.

## 2012-06-05 NOTE — Patient Instructions (Addendum)
Please take blood pressure every 4 hours and record in a log. Please call the office in 1 week with these numbers.   We will schedule for a 24 hour blood pressure cuff to be ordered, and notify you should be fitted for this.   Please monitor salt intake including canned foods, processed foods, frozen foods and meals at restaurants.   Please continue to take your blood pressure medications as prescribed.

## 2012-06-05 NOTE — Progress Notes (Signed)
Date:  06/05/2012   ID:  Rita Taylor, DOB [birth year], MRN [id number]  PCP:  Duncan Dull, MD  Primary Cardiologist:  Ellis Parents- M. Kirke Corin, MD   History of Present Illness: Rita Taylor is a 72 y.o. female with minimal PMHx including HTN and a history of melanoma who was referred by Dr. Darrick Huntsman for management of hypertension.   She has a long history of HTN. Both of her parents had high blood pressure and lived to their 80-90s, no cardiac issues.   She has been on various antihypertensives including Norvasc and Bystolic, both of which she was unable to tolerate due to dizziness. She has been on Maxzide and Losartan for some time with good BP control in the past (home readings 130s/80s). Maxzide was stopped transiently due to hyponatremia (Na 128 in 2013). Her BP increased off this and she restarted it with good control. She reports having isolated BP readings which were elevated (SBP 150-160s).   She does admit to increased anxiety when thinking of her BP and believed this drives up her BP. She is quite active walking her dogs 45 min/day and exercising on the elliptical. She eats out fairly regularly. Denies increased salt intake. Does eat canned soups. No tobacco, EtOH, caffeine or illicit drug use. No OTC NSAIDs or decongestants. No snoring, PND or history of sleep apnea. No history of thyroid issues- TSH 0.49 in 09/2011. Denies vision changes, slurred speech, headache, facial droop, numbness/tingling, urinary changes, chest pain or shortness of breath.   BP on arrival 175/80.   Manually performed after visit 148/78.   EKG today reveals NSR, 82 bpm, no LVH, no ST/T changes.  She is otherwise healthy without additional complaints.   Wt Readings from Last 3 Encounters:  06/05/12 129 lb 4 oz (58.627 kg)  05/12/12 130 lb 8 oz (59.194 kg)  01/26/12 128 lb (58.06 kg)     Past Medical History  Diagnosis Date  . Hypertension   . Melanoma 2008    removed by Orson Aloe, Wider  excision by Katrinka Blazing left flank    Current Outpatient Prescriptions  Medication Sig Dispense Refill  . alendronate (FOSAMAX) 70 MG tablet Take 1 tablet (70 mg total) by mouth once a week. Take with a full glass of water on an empty stomach.  12 tablet  3  . aspirin 81 MG tablet Take 81 mg by mouth daily.        . Calcium Carbonate-Vit D-Min (CALCIUM 1200) 1200-1000 MG-UNIT CHEW Chew 1 tablet by mouth daily.        Marland Kitchen losartan (COZAAR) 100 MG tablet Take 1 tablet (100 mg total) by mouth daily.  90 tablet  3  . triamterene-hydrochlorothiazide (MAXZIDE-25) 37.5-25 MG per tablet Take 1 each (1 tablet total) by mouth daily.  30 tablet  6   No current facility-administered medications for this visit.    Allergies:    Allergies  Allergen Reactions  . Risedronate Sodium     hives    Social History:  The patient  reports that she has never smoked. She has never used smokeless tobacco. She reports that  drinks alcohol. She reports that she does not use illicit drugs.   ROS:  Please see the history of present illness.    All other systems reviewed and negative.   PHYSICAL EXAM: VS:  BP 175/80  Pulse 82  Ht 5\' 3"  (1.6 m)  Wt 129 lb 4 oz (58.627 kg)  BMI 22.9 kg/m2 Gen: Thin, well nourished,  well developed, in no acute distress HEENT: normal Neck: no JVD Cardiac:  normal S1, S2; RRR; no murmur Lungs:  clear to auscultation bilaterally, no wheezing, rhonchi or rales Abd: soft, nontender, no hepatomegaly Ext: no edema Skin: warm and dry Neuro:  CNs 2-12 intact, no focal abnormalities noted  EKG:  NSR, 82 bpm, no LVH, no ST/T changes

## 2012-06-09 ENCOUNTER — Encounter (INDEPENDENT_AMBULATORY_CARE_PROVIDER_SITE_OTHER): Payer: Medicare Other

## 2012-06-09 DIAGNOSIS — I10 Essential (primary) hypertension: Secondary | ICD-10-CM | POA: Diagnosis not present

## 2012-06-10 ENCOUNTER — Telehealth: Payer: Self-pay | Admitting: Physician Assistant

## 2012-06-10 NOTE — Telephone Encounter (Signed)
Pt called the answering svc re: question with 24-hour BP monitor. I recently saw her in the office for HTN. There was a significant component of anxiety +/- whitecoat syndrome with this. The plan was to get a better trend of her BPs at home at all times during the day. She had been wearing it for ~20 hours when she slipped it off to take a shower. She had some difficulty placing it back on, but was able to do so. She is concerned that it may have stopped recording. Advised that it should continue to record, and if not, 20 hours is a sufficient amount of time to trend her BP. She will continue to manually take her BP until Monday as planned at the last office visit. She will call the office with these numbers on Monday. Further recommendations to follow based on this. She understood and agreed.    Jacqulyn Bath, PA-C 06/10/2012 12:13 PM

## 2012-06-19 ENCOUNTER — Telehealth: Payer: Self-pay

## 2012-06-19 NOTE — Telephone Encounter (Signed)
Pt would like BP monitor results

## 2012-06-19 NOTE — Telephone Encounter (Signed)
Per Alinda Money, [place], he asks that we make sure pt has f/u with bim in 2 weeks. He also asks that we call pt to assess BPs

## 2012-06-20 NOTE — Telephone Encounter (Signed)
BP readings are as follows: 150/89, 147/81, 135/75 151/80, 137/67 140/63, 130/70 145/76, 136/72 129/66, 139/68  Will forward to PA Still awaiting BP monitor results

## 2012-06-20 NOTE — Telephone Encounter (Signed)
I called [employer] to get results Monitor has been received, per staff in treadmill room, but Delice Bison is not in office so it has not been downloaded Pt dropped off BP readings when she dropped off BP monitor Treadmill room staff have this and will fax these readings to me I will give to [place], [place]

## 2012-06-20 NOTE — Telephone Encounter (Signed)
Please schedule pt 2 week f/u with PA thanks

## 2012-06-20 NOTE — Telephone Encounter (Signed)
Thanks, will review and address on follow-up.

## 2012-06-21 NOTE — Telephone Encounter (Signed)
Pt scheduled for appt on 5/19 with Alinda Money. Pt wasn't happy to have to come in for appt

## 2012-07-03 ENCOUNTER — Encounter: Payer: Self-pay | Admitting: Physician Assistant

## 2012-07-03 ENCOUNTER — Ambulatory Visit (INDEPENDENT_AMBULATORY_CARE_PROVIDER_SITE_OTHER): Payer: Medicare Other | Admitting: Physician Assistant

## 2012-07-03 VITALS — BP 164/80 | HR 65 | Ht 63.0 in | Wt 127.0 lb

## 2012-07-03 DIAGNOSIS — I10 Essential (primary) hypertension: Secondary | ICD-10-CM

## 2012-07-03 DIAGNOSIS — R609 Edema, unspecified: Secondary | ICD-10-CM

## 2012-07-03 DIAGNOSIS — R600 Localized edema: Secondary | ICD-10-CM

## 2012-07-03 NOTE — Assessment & Plan Note (Signed)
Blood pressures are very well-controlled at home. JNC VIII guidelines indicate in patients >/= 73 yo with no additional risk factors, BP < 150/90 is controlled. She has recorded approximately fifty BP readings at home. Of those, <5% represent SBP > 150. She reports taking blood pressures more frequently has actually decreased her anxiety about hypertension. This likely contributes to her adequate control. Offered PRN BB for anxiety-induced HTN, however the patient declined. She will call the office if she finds her BP are returning consistently elevated. Continue current antihypertensives. Follow-up as needed.

## 2012-07-03 NOTE — Assessment & Plan Note (Addendum)
In the setting of alendronate use. She has been on this for 2.5 years, however she developed hives on risedronate after prolonged use which resolved after several months post-withdrawal. 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 No change in vision. No redness or tenderness. Exam reveals fluctuant fluid collections in periorbital and upper lids bilaterally. Benadryl has improved the swelling. She has stopped alendronate. Advised to use benadryl PRN and follow-up with Dr. Darrick Huntsman for further recommendations.

## 2012-07-03 NOTE — Progress Notes (Signed)
Patient ID: Rita Taylor, female   DOB: [birth year], 72 y.o.   MRN: [id number]            Date:  07/03/2012   ID:  Rita Taylor, DOB 1940/07/22, MRN [id number]  PCP:  Duncan Dull, MD  Primary Cardiologist:  Judie Petit. Kirke Corin, MD   History of Present Illness: Rita Taylor is a 72 y.o. female with minimal PMHx including HTN and a history of melanoma who was referred by Dr. Darrick Huntsman last month for management of hypertension.   She has a long history of HTN. Both of her parents had high blood pressure and lived to their 80-90s, no cardiac issues. She has been on various antihypertensives including Norvasc and Bystolic, both of which she was unable to tolerate due to dizziness. She has been on Maxzide and Losartan for some time with good BP control in the past (home readings 130s/80s). Maxzide was stopped transiently due to hyponatremia (Na 128 in 2013). Her BP increased off this and she restarted it with good control. She reports having isolated BP readings which were elevated (SBP 150-160s). She was set up with a 24-hour home BP monitoring and advised to record her BPs also.   Readings range from:   118-151/65-89. Out of multiple readings, two are just above 150 representing < 5% of total home BP readings. Majority of readings 130s/60s.  BP 164/80 today  She notes new onset facial swelling attributed to alendronate. No vision changes, discomfort, fevers or chills. She has taken benadryl with improvement. She has had hives while on a previous bisphosphonate- risedronate.  EKG: NSR, 65 bpm, no ST/T changes, possible LAE  Wt Readings from Last 3 Encounters:  07/03/12 127 lb (57.607 kg)  06/05/12 129 lb 4 oz (58.627 kg)  05/12/12 130 lb 8 oz (59.194 kg)     Past Medical History  Diagnosis Date  . Hypertension   . Melanoma 2008    removed by Orson Aloe, Wider excision by Katrinka Blazing left flank    Current Outpatient Prescriptions  Medication Sig Dispense Refill  . aspirin 81 MG tablet Take 81 mg  by mouth daily.        . Calcium Carbonate-Vit D-Min (CALCIUM 1200) 1200-1000 MG-UNIT CHEW Chew 1 tablet by mouth daily.        Marland Kitchen losartan (COZAAR) 100 MG tablet Take 1 tablet (100 mg total) by mouth daily.  90 tablet  3  . triamterene-hydrochlorothiazide (MAXZIDE-25) 37.5-25 MG per tablet Take 1 each (1 tablet total) by mouth daily.  30 tablet  6   No current facility-administered medications for this visit.    Allergies:    Allergies  Allergen Reactions  . Fosamax (Alendronate)     SWELLING  . Risedronate Sodium     hives    Social History:  The patient  reports that she has never smoked. She has never used smokeless tobacco. She reports that  drinks alcohol. She reports that she does not use illicit drugs.   ROS:  Please see the history of present illness.   All other systems reviewed and negative.   PHYSICAL EXAM: VS:  BP 164/80  Pulse 65  Ht 5\' 3"  (1.6 m)  Wt 127 lb (57.607 kg)  BMI 22.5 kg/m2 Well nourished, well developed, in no acute distress HEENT: fluctuant fluids collections to periorbital regions bilaterally, PERRL bilaterally, no erythema or tenderness Neck: no JVD Cardiac:  normal S1, S2; RRR; no murmur Lungs:  clear to auscultation bilaterally, no wheezing, rhonchi or rales  Abd: soft, nontender, no hepatomegaly Ext: no edema Skin: warm and dry Neuro:  CNs 2-12 intact, no focal abnormalities noted

## 2012-07-03 NOTE — Patient Instructions (Addendum)
We will not make any further medication changes.  Your blood pressure readings at home are well-controlled. Please follow-up with Dr. Darrick Huntsman for facial swelling and recommendations regarding alendronate.   Please call the office if you find your blood pressures (top number) is consistently above 150. If so, we can try an as needed blood pressure medicine.

## 2012-08-04 ENCOUNTER — Other Ambulatory Visit: Payer: Self-pay

## 2012-08-07 ENCOUNTER — Other Ambulatory Visit: Payer: Self-pay

## 2012-08-07 DIAGNOSIS — I10 Essential (primary) hypertension: Secondary | ICD-10-CM

## 2012-10-26 ENCOUNTER — Other Ambulatory Visit: Payer: Self-pay | Admitting: Internal Medicine

## 2012-11-08 ENCOUNTER — Ambulatory Visit (INDEPENDENT_AMBULATORY_CARE_PROVIDER_SITE_OTHER): Payer: Medicare Other | Admitting: Internal Medicine

## 2012-11-08 ENCOUNTER — Encounter: Payer: Self-pay | Admitting: Internal Medicine

## 2012-11-08 VITALS — BP 174/80 | HR 74 | Temp 98.1°F | Resp 14 | Ht 63.0 in | Wt 126.5 lb

## 2012-11-08 DIAGNOSIS — E871 Hypo-osmolality and hyponatremia: Secondary | ICD-10-CM

## 2012-11-08 DIAGNOSIS — Z01419 Encounter for gynecological examination (general) (routine) without abnormal findings: Secondary | ICD-10-CM | POA: Diagnosis not present

## 2012-11-08 DIAGNOSIS — I10 Essential (primary) hypertension: Secondary | ICD-10-CM

## 2012-11-08 DIAGNOSIS — E785 Hyperlipidemia, unspecified: Secondary | ICD-10-CM | POA: Diagnosis not present

## 2012-11-08 DIAGNOSIS — R5381 Other malaise: Secondary | ICD-10-CM

## 2012-11-08 DIAGNOSIS — Z124 Encounter for screening for malignant neoplasm of cervix: Secondary | ICD-10-CM

## 2012-11-08 DIAGNOSIS — Z23 Encounter for immunization: Secondary | ICD-10-CM

## 2012-11-08 DIAGNOSIS — M810 Age-related osteoporosis without current pathological fracture: Secondary | ICD-10-CM

## 2012-11-08 DIAGNOSIS — Z1211 Encounter for screening for malignant neoplasm of colon: Secondary | ICD-10-CM

## 2012-11-08 DIAGNOSIS — Z0000 Encounter for general adult medical examination without abnormal findings: Secondary | ICD-10-CM | POA: Diagnosis not present

## 2012-11-08 DIAGNOSIS — Z85820 Personal history of malignant melanoma of skin: Secondary | ICD-10-CM

## 2012-11-08 LAB — CBC WITH DIFFERENTIAL/PLATELET
Basophils Absolute: 0.1 10*3/uL (ref 0.0–0.1)
HCT: 38.1 % (ref 36.0–46.0)
Lymphs Abs: 2.1 10*3/uL (ref 0.7–4.0)
Monocytes Absolute: 0.5 10*3/uL (ref 0.1–1.0)
Monocytes Relative: 6.3 % (ref 3.0–12.0)
Neutrophils Relative %: 65.5 % (ref 43.0–77.0)
Platelets: 250 10*3/uL (ref 150.0–400.0)
RDW: 13.3 % (ref 11.5–14.6)
WBC: 8.1 10*3/uL (ref 4.5–10.5)

## 2012-11-08 LAB — COMPREHENSIVE METABOLIC PANEL
ALT: 17 U/L (ref 0–35)
Albumin: 4.6 g/dL (ref 3.5–5.2)
CO2: 30 mEq/L (ref 19–32)
Chloride: 95 mEq/L — ABNORMAL LOW (ref 96–112)
GFR: 75.82 mL/min (ref >60.00)
Glucose, Bld: 100 mg/dL — ABNORMAL HIGH (ref 70–99)
Potassium: 4.5 mEq/L (ref 3.5–5.1)
Sodium: 132 mEq/L — ABNORMAL LOW (ref 135–145)
Total Bilirubin: 0.9 mg/dL (ref 0.3–1.2)
Total Protein: 7.1 g/dL (ref 6.0–8.3)

## 2012-11-08 LAB — LDL CHOLESTEROL, DIRECT: Direct LDL: 142.6 mg/dL

## 2012-11-08 LAB — TSH: TSH: 0.8 u[IU]/mL (ref 0.35–5.50)

## 2012-11-08 LAB — LIPID PANEL
HDL: 94.4 mg/dL (ref >39.00)
Total CHOL/HDL Ratio: 3
Triglycerides: 40 mg/dL (ref 0.0–149.0)
VLDL: 8 mg/dL (ref 0.0–40.0)

## 2012-11-08 NOTE — Progress Notes (Signed)
Patient ID: Rita Taylor, female   DOB: 26-Dec-1940, 72 y.o.   MRN: [id number]   The patient is here for annual Medicare wellness examination and management of other chronic and acute problems.   The risk factors are reflected in the social history.  The roster of all physicians providing medical care to patient - is listed in the Snapshot section of the chart.  Activities of daily living:  The patient is 100% independent in all ADLs: dressing, toileting, feeding as well as independent mobility  Home safety : The patient has smoke detectors in the home. They wear seatbelts.  There are no firearms at home. There is no violence in the home.   There is no risks for hepatitis, STDs or HIV. There is no   history of blood transfusion. They have no travel history to infectious disease endemic areas of the world.  The patient has seen their dentist in the last six month. They have seen their eye doctor in the last year. They admit to slight hearing difficulty with regard to whispered voices and some television programs.  They have deferred audiologic testing in the last year.  They do not  have excessive sun exposure. Discussed the need for sun protection: hats, long sleeves and use of sunscreen if there is significant sun exposure.   Diet: the importance of a healthy diet is discussed. They do have a healthy diet.  The benefits of regular aerobic exercise were discussed. She walks 4 times per week ,  20 minutes.   Depression screen: there are no signs or vegative symptoms of depression- irritability, change in appetite, anhedonia, sadness/tearfullness.  Cognitive assessment: the patient manages all their financial and personal affairs and is actively engaged. They could relate day,date,year and events; recalled 2/3 objects at 3 minutes; performed clock-face test normally.  The following portions of the patient's history were reviewed and updated as appropriate: allergies, current medications, past  family history, past medical history,  past surgical history, past social history  and problem list.  Visual acuity was not assessed per patient preference since she has regular follow up with her ophthalmologist. Hearing and body mass index were assessed and reviewed.   During the course of the visit the patient was educated and counseled about appropriate screening and preventive services including : fall prevention , diabetes screening, nutrition counseling, colorectal cancer screening, and recommended immunizations.    Objective: BP 174/80  Pulse 74  Temp(Src) 98.1 F (36.7 C) (Oral)  Resp 14  Ht 5\' 3"  (1.6 m)  Wt 126 lb 8 oz (57.38 kg)  BMI 22.41 kg/m2  SpO2 98%  General Appearance:    Alert, cooperative, no distress, appears stated age  Head:    Normocephalic, without obvious abnormality, atraumatic  Eyes:    PERRL, conjunctiva/corneas clear, EOM's intact, fundi    benign, both eyes  Ears:    Normal TM's and external ear canals, both ears  Nose:   Nares normal, septum midline, mucosa normal, no drainage    or sinus tenderness  Throat:   Lips, mucosa, and tongue normal; teeth and gums normal  Neck:   Supple, symmetrical, trachea midline, no adenopathy;    thyroid:  no enlargement/tenderness/nodules; no carotid   bruit or JVD  Back:     Symmetric, no curvature, ROM normal, no CVA tenderness  Lungs:     Clear to auscultation bilaterally, respirations unlabored  Chest Wall:    No tenderness or deformity   Heart:  Regular rate and rhythm, S1 and S2 normal, no murmur, rub   or gallop  Breast Exam:    Exam compromised by very firm implants. No tenderness, masses, or nipple abnormality  Abdomen:     Soft, non-tender, bowel sounds active all four quadrants,    no masses, no organomegaly  Genitalia:    Pelvic: cervix normal in appearance, external genitalia normal, no adnexal masses or tenderness, no cervical motion tenderness, rectovaginal septum normal, uterus normal size, shape,  and consistency and vagina normal without discharge  Extremities:   Extremities normal, atraumatic, no cyanosis or edema  Pulses:   2+ and symmetric all extremities  Skin:   Skin color, texture, turgor normal, no rashes or lesions  Lymph nodes:   Cervical, supraclavicular, and axillary nodes normal  Neurologic:   CNII-XII intact, normal strength, sensation and reflexes    throughout   Assessment and Plan:  Hypertension Well controlled on current regimen by home readings.  elevations are considered to be due to white coat hypertension,  Per cardiology eval with 24 hr ambulatory monitor. . Renal function stable, no changes today.  Hyponatremia Mild asymptomatic,  Secondary to use of diuretic   Routine general medical examination at a health care facility Annual comprehensive exam was done including breast, pelvic without PAP smear, which was normal 2013.. All screenings have been addressed .   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    Updated Medication List Outpatient Encounter Prescriptions as of 11/08/2012  Medication Sig Dispense Refill  . aspirin 81 MG tablet Take 81 mg by mouth daily.        . Calcium Carbonate-Vit D-Min (CALCIUM 1200) 1200-1000 MG-UNIT CHEW Chew 1 tablet by mouth daily.        Marland Kitchen losartan (COZAAR) 100 MG tablet Take 1 tablet (100 mg total) by mouth daily.  90 tablet  3  . triamterene-hydrochlorothiazide (MAXZIDE-25) 37.5-25 MG per tablet take 1 tablet by mouth once daily  30 tablet  5   No facility-administered encounter medications on file as of 11/08/2012.

## 2012-11-08 NOTE — Patient Instructions (Addendum)
You had your annual Medicare wellness exam today  We will schedule your mammogram at [facility] soon.(after Sept 27)  Please use the stool kit to send [place] back a sample to test for blood.  This is your colon CA screening test. If it is positive I will send you back to Dr Kinnie Scales now.   You received the pneumonia vaccine today.  We will contact you with the bloodwork results  Continue one calcium supplement daily, get the rest of your calcium needs throught diet (1800 mg total daily need,  800 units of Vit D)   DEXA will be repeated in April 2015

## 2012-11-09 ENCOUNTER — Encounter: Payer: Self-pay | Admitting: *Deleted

## 2012-11-10 DIAGNOSIS — M810 Age-related osteoporosis without current pathological fracture: Secondary | ICD-10-CM | POA: Insufficient documentation

## 2012-11-10 NOTE — Assessment & Plan Note (Signed)
Well controlled on current regimen by home readings.  elevations are considered to be due to white coat hypertension,  Per cardiology eval with 24 hr ambulatory monitor. . Renal function stable, no changes today.

## 2012-11-10 NOTE — Assessment & Plan Note (Signed)
She has had no recurrence but needs to resume annual dermatology evaluations.  Skin check today was normal

## 2012-11-10 NOTE — Assessment & Plan Note (Signed)
Repeat due April 2015 after one uear without treatment

## 2012-11-10 NOTE — Assessment & Plan Note (Addendum)
Annual comprehensive exam was done including breast, pelvic without PAP smear, which was normal 2013.. All screenings have been addressed .

## 2012-11-10 NOTE — Assessment & Plan Note (Signed)
Mild asymptomatic,  Secondary to use of diuretic    

## 2012-11-13 ENCOUNTER — Other Ambulatory Visit (INDEPENDENT_AMBULATORY_CARE_PROVIDER_SITE_OTHER): Payer: Medicare Other

## 2012-11-13 DIAGNOSIS — Z1211 Encounter for screening for malignant neoplasm of colon: Secondary | ICD-10-CM | POA: Diagnosis not present

## 2012-11-14 ENCOUNTER — Other Ambulatory Visit: Payer: Self-pay | Admitting: *Deleted

## 2012-11-14 DIAGNOSIS — Z1211 Encounter for screening for malignant neoplasm of colon: Secondary | ICD-10-CM

## 2012-11-16 ENCOUNTER — Encounter: Payer: Self-pay | Admitting: *Deleted

## 2012-11-17 ENCOUNTER — Telehealth: Payer: Self-pay | Admitting: Internal Medicine

## 2012-11-17 DIAGNOSIS — Z85820 Personal history of malignant melanoma of skin: Secondary | ICD-10-CM

## 2012-11-17 NOTE — Telephone Encounter (Signed)
Patient waiting on her referral with Dr Migdalia Dk the Dermatologist

## 2012-11-21 NOTE — Telephone Encounter (Signed)
Referral is in process as requested 

## 2012-11-27 ENCOUNTER — Other Ambulatory Visit: Payer: Self-pay

## 2012-11-27 DIAGNOSIS — Z1231 Encounter for screening mammogram for malignant neoplasm of breast: Secondary | ICD-10-CM

## 2012-12-05 LAB — HM PAP SMEAR: HM PAP: NORMAL

## 2012-12-14 ENCOUNTER — Ambulatory Visit
Admission: RE | Admit: 2012-12-14 | Discharge: 2012-12-14 | Disposition: A | Discharge status: Home / Self Care | Payer: Medicare Other | Source: Ambulatory Visit

## 2012-12-14 DIAGNOSIS — Z1231 Encounter for screening mammogram for malignant neoplasm of breast: Secondary | ICD-10-CM

## 2013-02-20 ENCOUNTER — Other Ambulatory Visit: Payer: Self-pay | Admitting: Internal Medicine

## 2013-02-21 DIAGNOSIS — D1801 Hemangioma of skin and subcutaneous tissue: Secondary | ICD-10-CM | POA: Diagnosis not present

## 2013-02-21 DIAGNOSIS — Z85820 Personal history of malignant melanoma of skin: Secondary | ICD-10-CM | POA: Diagnosis not present

## 2013-02-21 DIAGNOSIS — D235 Other benign neoplasm of skin of trunk: Secondary | ICD-10-CM | POA: Diagnosis not present

## 2013-03-08 DIAGNOSIS — Z86010 Personal history of colonic polyps: Secondary | ICD-10-CM | POA: Diagnosis not present

## 2013-03-14 LAB — HM COLONOSCOPY: HM Colonoscopy: NORMAL

## 2013-04-02 ENCOUNTER — Encounter: Payer: Self-pay | Admitting: Internal Medicine

## 2013-04-22 ENCOUNTER — Other Ambulatory Visit: Payer: Self-pay | Admitting: Internal Medicine

## 2013-06-20 ENCOUNTER — Other Ambulatory Visit: Payer: Self-pay | Admitting: Internal Medicine

## 2013-06-20 DIAGNOSIS — I10 Essential (primary) hypertension: Secondary | ICD-10-CM

## 2013-06-20 DIAGNOSIS — E785 Hyperlipidemia, unspecified: Secondary | ICD-10-CM

## 2013-06-20 NOTE — Telephone Encounter (Signed)
Last labs 11/08/12, needed repeat non fasting labs in 6 months, what labs would you like and I'll call patient to schedule. Does she need follow up with you as well, last visit 11/08/12?

## 2013-06-21 NOTE — Telephone Encounter (Signed)
30 day refill only,  Needs CMET and fasting lipids prior to any more refills 

## 2013-07-11 ENCOUNTER — Other Ambulatory Visit (INDEPENDENT_AMBULATORY_CARE_PROVIDER_SITE_OTHER): Payer: Medicare Other

## 2013-07-11 DIAGNOSIS — E785 Hyperlipidemia, unspecified: Secondary | ICD-10-CM

## 2013-07-11 DIAGNOSIS — I10 Essential (primary) hypertension: Secondary | ICD-10-CM

## 2013-07-11 LAB — COMPREHENSIVE METABOLIC PANEL
ALBUMIN: 4 g/dL (ref 3.5–5.2)
ALK PHOS: 55 U/L (ref 39–117)
ALT: 14 U/L (ref 0–35)
AST: 20 U/L (ref 0–37)
BUN: 13 mg/dL (ref 6–23)
CALCIUM: 9.4 mg/dL (ref 8.4–10.5)
CHLORIDE: 96 meq/L (ref 96–112)
CO2: 29 mEq/L (ref 19–32)
Creatinine, Ser: 0.8 mg/dL (ref 0.4–1.2)
GFR: 80.36 mL/min (ref >60.00)
GLUCOSE: 84 mg/dL (ref 70–99)
Potassium: 4.3 mEq/L (ref 3.5–5.1)
Sodium: 133 mEq/L — ABNORMAL LOW (ref 135–145)
Total Bilirubin: 1 mg/dL (ref 0.2–1.2)
Total Protein: 6.4 g/dL (ref 6.0–8.3)

## 2013-07-11 LAB — LIPID PANEL
CHOLESTEROL: 239 mg/dL — AB (ref 0–200)
HDL: 82.3 mg/dL (ref >39.00)
LDL Cholesterol: 148 mg/dL — ABNORMAL HIGH (ref 0–99)
TRIGLYCERIDES: 46 mg/dL (ref 0.0–149.0)
Total CHOL/HDL Ratio: 3
VLDL: 9.2 mg/dL (ref 0.0–40.0)

## 2013-07-13 ENCOUNTER — Encounter: Payer: Self-pay | Admitting: *Deleted

## 2013-07-24 ENCOUNTER — Other Ambulatory Visit: Payer: Self-pay | Admitting: Internal Medicine

## 2013-08-12 ENCOUNTER — Other Ambulatory Visit: Payer: Self-pay | Admitting: Internal Medicine

## 2013-08-19 ENCOUNTER — Telehealth: Payer: Self-pay | Admitting: Internal Medicine

## 2013-11-12 ENCOUNTER — Ambulatory Visit (INDEPENDENT_AMBULATORY_CARE_PROVIDER_SITE_OTHER): Payer: Medicare Other | Admitting: Internal Medicine

## 2013-11-12 ENCOUNTER — Encounter: Payer: Self-pay | Admitting: Internal Medicine

## 2013-11-12 VITALS — BP 142/76 | HR 74 | Temp 98.0°F | Resp 16 | Ht 63.0 in | Wt 126.2 lb

## 2013-11-12 DIAGNOSIS — E559 Vitamin D deficiency, unspecified: Secondary | ICD-10-CM | POA: Diagnosis not present

## 2013-11-12 DIAGNOSIS — Z1239 Encounter for other screening for malignant neoplasm of breast: Secondary | ICD-10-CM | POA: Diagnosis not present

## 2013-11-12 DIAGNOSIS — M810 Age-related osteoporosis without current pathological fracture: Secondary | ICD-10-CM | POA: Diagnosis not present

## 2013-11-12 DIAGNOSIS — R5383 Other fatigue: Secondary | ICD-10-CM

## 2013-11-12 DIAGNOSIS — Z23 Encounter for immunization: Secondary | ICD-10-CM

## 2013-11-12 DIAGNOSIS — Z0000 Encounter for general adult medical examination without abnormal findings: Secondary | ICD-10-CM

## 2013-11-12 DIAGNOSIS — I10 Essential (primary) hypertension: Secondary | ICD-10-CM | POA: Diagnosis not present

## 2013-11-12 DIAGNOSIS — R5381 Other malaise: Secondary | ICD-10-CM | POA: Diagnosis not present

## 2013-11-12 DIAGNOSIS — E785 Hyperlipidemia, unspecified: Secondary | ICD-10-CM | POA: Diagnosis not present

## 2013-11-12 DIAGNOSIS — Z79899 Other long term (current) drug therapy: Secondary | ICD-10-CM

## 2013-11-12 DIAGNOSIS — E871 Hypo-osmolality and hyponatremia: Secondary | ICD-10-CM

## 2013-11-12 LAB — CBC WITH DIFFERENTIAL/PLATELET
Basophils Absolute: 0.1 10*3/uL (ref 0.0–0.1)
Basophils Relative: 0.7 % (ref 0.0–3.0)
EOS PCT: 0.3 % (ref 0.0–5.0)
Eosinophils Absolute: 0 10*3/uL (ref 0.0–0.7)
HCT: 38.4 % (ref 36.0–46.0)
Hemoglobin: 12.8 g/dL (ref 12.0–15.0)
Lymphocytes Relative: 29.2 % (ref 12.0–46.0)
Lymphs Abs: 2.1 10*3/uL (ref 0.7–4.0)
MCHC: 33.3 g/dL (ref 30.0–36.0)
MCV: 89.9 fl (ref 78.0–100.0)
MONO ABS: 0.4 10*3/uL (ref 0.1–1.0)
MONOS PCT: 6 % (ref 3.0–12.0)
Neutro Abs: 4.6 10*3/uL (ref 1.4–7.7)
Neutrophils Relative %: 63.8 % (ref 43.0–77.0)
PLATELETS: 239 10*3/uL (ref 150.0–400.0)
RBC: 4.27 Mil/uL (ref 3.87–5.11)
RDW: 13.8 % (ref 11.5–15.5)
WBC: 7.3 10*3/uL (ref 4.0–10.5)

## 2013-11-12 LAB — LIPID PANEL
Cholesterol: 245 mg/dL — ABNORMAL HIGH (ref 0–200)
HDL: 80.8 mg/dL (ref >39.00)
LDL Cholesterol: 150 mg/dL — ABNORMAL HIGH (ref 0–99)
NonHDL: 164.2
Total CHOL/HDL Ratio: 3
Triglycerides: 72 mg/dL (ref 0.0–149.0)
VLDL: 14.4 mg/dL (ref 0.0–40.0)

## 2013-11-12 LAB — COMPREHENSIVE METABOLIC PANEL
ALBUMIN: 4.4 g/dL (ref 3.5–5.2)
ALT: 13 U/L (ref 0–35)
AST: 17 U/L (ref 0–37)
Alkaline Phosphatase: 58 U/L (ref 39–117)
BUN: 12 mg/dL (ref 6–23)
CALCIUM: 9.1 mg/dL (ref 8.4–10.5)
CHLORIDE: 93 meq/L — AB (ref 96–112)
CO2: 32 mEq/L (ref 19–32)
Creatinine, Ser: 0.7 mg/dL (ref 0.4–1.2)
GFR: 88.39 mL/min (ref >60.00)
GLUCOSE: 104 mg/dL — AB (ref 70–99)
POTASSIUM: 4.5 meq/L (ref 3.5–5.1)
Sodium: 130 mEq/L — ABNORMAL LOW (ref 135–145)
Total Bilirubin: 0.8 mg/dL (ref 0.2–1.2)
Total Protein: 7 g/dL (ref 6.0–8.3)

## 2013-11-12 LAB — VITAMIN D 25 HYDROXY (VIT D DEFICIENCY, FRACTURES): VITD: 47.49 ng/mL (ref 30.00–100.00)

## 2013-11-12 LAB — TSH: TSH: 1.07 u[IU]/mL (ref 0.35–4.50)

## 2013-11-12 NOTE — Progress Notes (Signed)
Pre-visit discussion using our clinic review tool. No additional management support is needed unless otherwise documented below in the visit note.  

## 2013-11-12 NOTE — Assessment & Plan Note (Addendum)
Well controlled on current regimen. Renal function stable, no changes today.  Lab Results  Component Value Date   CREATININE 0.8 07/11/2013   Lab Results  Component Value Date   NA 133* 07/11/2013   K 4.3 07/11/2013   CL 96 07/11/2013   CO2 29 07/11/2013

## 2013-11-12 NOTE — Progress Notes (Signed)
Patient ID: Rita Taylor, female   DOB: [birth year], 73 y.o.   MRN: [id number]  The patient is here for annual Medicare wellness examination and management of other chronic and acute problems.   The risk factors are reflected in the social history.  The roster of all physicians providing medical care to patient - is listed in the Snapshot section of the chart.  Activities of daily living:  The patient is 100% independent in all ADLs: dressing, toileting, feeding as well as independent mobility  Home safety : The patient has smoke detectors in the home. They wear seatbelts.  There are no firearms at home. There is no violence in the home.   There is no risks for hepatitis, STDs or HIV. There is no   history of blood transfusion. They have no travel history to infectious disease endemic areas of the world.  The patient has seen their dentist in the last six month. They have seen their eye doctor in the last year. They admit to slight hearing difficulty with regard to whispered voices and some television programs.  They have deferred audiologic testing in the last year.  They do not  have excessive sun exposure. Discussed the need for sun protection: hats, long sleeves and use of sunscreen if there is significant sun exposure.   Diet: the importance of a healthy diet is discussed. They do have a healthy diet.  The benefits of regular aerobic exercise were discussed. She walks 4 times per week ,  20 minutes.   Depression screen: there are no signs or vegative symptoms of depression- irritability, change in appetite, anhedonia, sadness/tearfullness.  Cognitive assessment: the patient manages all their financial and personal affairs and is actively engaged. They could relate day,date,year and events; recalled 2/3 objects at 3 minutes; performed clock-face test normally.  The following portions of the patient's history were reviewed and updated as appropriate: allergies, current medications, past  family history, past medical history,  past surgical history, past social history  and problem list.  Visual acuity was not assessed per patient preference since she has regular follow up with her ophthalmologist. Hearing and body mass index were assessed and reviewed.   During the course of the visit the patient was educated and counseled about appropriate screening and preventive services including : fall prevention , diabetes screening, nutrition counseling, colorectal cancer screening, and recommended immunizations.    Objective:  BP 142/76  Pulse 74  Temp(Src) 98 F (36.7 C) (Oral)  Resp 16  Ht 5\' 3"  (1.6 m)  Wt 126 lb 4 oz (57.267 kg)  BMI 22.37 kg/m2  SpO2 95%  General appearance: alert, cooperative and appears stated age Head: Normocephalic, without obvious abnormality, atraumatic Eyes: conjunctivae/corneas clear. PERRL, EOM's intact. Fundi benign. Ears: normal TM's and external ear canals both ears Nose: Nares normal. Septum midline. Mucosa normal. No drainage or sinus tenderness. Throat: lips, mucosa, and tongue normal; teeth and gums normal Neck: no adenopathy, no carotid bruit, no JVD, supple, symmetrical, trachea midline and thyroid not enlarged, symmetric, no tenderness/mass/nodules Lungs: clear to auscultation bilaterally Breasts: silicone implants. no masses or tenderness Heart: regular rate and rhythm, S1, S2 normal, no murmur, click, rub or gallop Abdomen: soft, non-tender; bowel sounds normal; no masses,  no organomegaly Extremities: extremities normal, atraumatic, no cyanosis or edema Pulses: 2+ and symmetric Skin: Skin color, texture, turgor normal. No rashes or lesions Neurologic: Alert and oriented X 3, normal strength and tone. Normal symmetric reflexes. Normal coordination and gait.  Assessment and Plan  Hypertension Well controlled on current regimen. Renal function stable, no changes today.  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 Repeat DEXA ordered   Encounter for Medicare annual wellness exam Annual Medicare wellness  exam was done as well as a comprehensive physical exam and management of acute and chronic conditions .  During the course of the visit the patient was educated and counseled about appropriate screening and preventive services including : fall prevention , diabetes screening, nutrition counseling, colorectal cancer screening, and recommended immunizations.  Printed recommendations for health maintenance screenings was given.   Hyponatremia Mild asymptomatic,  Secondary to use of diuretic      Updated Medication List Outpatient Encounter Prescriptions as of 11/12/2013  Medication Sig  . aspirin 81 MG tablet Take 81 mg by mouth daily.    . Calcium Carbonate-Vit D-Min (CALCIUM 1200) 1200-1000 MG-UNIT CHEW Chew 1 tablet by mouth daily.    Marland Kitchen losartan (COZAAR) 100 MG tablet take 1 tablet by mouth once daily  . triamterene-hydrochlorothiazide (MAXZIDE-25) 37.5-25 MG per tablet take 1 tablet by mouth once daily

## 2013-11-12 NOTE — Assessment & Plan Note (Signed)
Mild asymptomatic,  Secondary to use of diuretic

## 2013-11-12 NOTE — Assessment & Plan Note (Addendum)
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 Repeat DEXA ordered

## 2013-11-12 NOTE — Assessment & Plan Note (Signed)

## 2013-11-12 NOTE — Patient Instructions (Signed)
You had your annual Medicare wellness exam today  We will schedule your mammogram in November and your bone dnesity test soon.  You received the pneumonia and flu vaccines today  We will contact you with the bloodwork results  Health Maintenance Adopting a healthy lifestyle and getting preventive care can go a long way to promote health and wellness. Talk with your health care provider about what schedule of regular examinations is right for you. This is a good chance for you to check in with your provider about disease prevention and staying healthy. In between checkups, there are plenty of things you can do on your own. Experts have done a lot of research about which lifestyle changes and preventive measures are most likely to keep you healthy. Ask your health care provider for more information. WEIGHT AND DIET  Eat a healthy diet  Be sure to include plenty of vegetables, fruits, low-fat dairy products, and lean protein.  Do not eat a lot of foods high in solid fats, added sugars, or salt.  Get regular exercise. This is one of the most important things you can do for your health.  Most adults should exercise for at least 150 minutes each week. The exercise should increase your heart rate and make you sweat (moderate-intensity exercise).  Most adults should also do strengthening exercises at least twice a week. This is in addition to the moderate-intensity exercise.  Maintain a healthy weight  Body mass index (BMI) is a measurement that can be used to identify possible weight problems. It estimates body fat based on height and weight. Your health care provider can help determine your BMI and help you achieve or maintain a healthy weight.  For females 13 years of age and older:   A BMI below 18.5 is considered underweight.  A BMI of 18.5 to 24.9 is normal.  A BMI of 25 to 29.9 is considered overweight.  A BMI of 30 and above is considered obese.  Watch levels of cholesterol and  blood lipids  You should start having your blood tested for lipids and cholesterol at 74 years of age, then have this test every 5 years.  You may need to have your cholesterol levels checked more often if:  Your lipid or cholesterol levels are high.  You are older than 74 years of age.  You are at high risk for heart disease.  CANCER SCREENING   Lung Cancer  Lung cancer screening is recommended for adults 60-56 years old who are at high risk for lung cancer because of a history of smoking.  A yearly low-dose CT scan of the lungs is recommended for people who:  Currently smoke.  Have quit within the past 15 years.  Have at least a 30-pack-year history of smoking. A pack year is smoking an average of one pack of cigarettes a day for 1 year.  Yearly screening should continue until it has been 15 years since you quit.  Yearly screening should stop if you develop a health problem that would prevent you from having lung cancer treatment.  Breast Cancer  Practice breast self-awareness. This means understanding how your breasts normally appear and feel.  It also means doing regular breast self-exams. Let your health care provider know about any changes, no matter how small.  If you are in your 20s or 30s, you should have a clinical breast exam (CBE) by a health care provider every 1-3 years as part of a regular health exam.  If you  are 68 or older, have a CBE every year. Also consider having a breast X-ray (mammogram) every year.  If you have a family history of breast cancer, talk to your health care provider about genetic screening.  If you are at high risk for breast cancer, talk to your health care provider about having an MRI and a mammogram every year.  Breast cancer gene (BRCA) assessment is recommended for women who have family members with BRCA-related cancers. BRCA-related cancers include:  Breast.  Ovarian.  Tubal.  Peritoneal cancers.  Results of the  assessment will determine the need for genetic counseling and BRCA1 and BRCA2 testing. Cervical Cancer Routine pelvic examinations to screen for cervical cancer are no longer recommended for nonpregnant women who are considered low risk for cancer of the pelvic organs (ovaries, uterus, and vagina) and who do not have symptoms. A pelvic examination may be necessary if you have symptoms including those associated with pelvic infections. Ask your health care provider if a screening pelvic exam is right for you.   The Pap test is the screening test for cervical cancer for women who are considered at risk.  If you had a hysterectomy for a problem that was not cancer or a condition that could lead to cancer, then you no longer need Pap tests.  If you are older than 65 years, and you have had normal Pap tests for the past 10 years, you no longer need to have Pap tests.  If you have had past treatment for cervical cancer or a condition that could lead to cancer, you need Pap tests and screening for cancer for at least 20 years after your treatment.  If you no longer get a Pap test, assess your risk factors if they change (such as having a new sexual partner). This can affect whether you should start being screened again.  Some women have medical problems that increase their chance of getting cervical cancer. If this is the case for you, your health care provider may recommend more frequent screening and Pap tests.  The human papillomavirus (HPV) test is another test that may be used for cervical cancer screening. The HPV test looks for the virus that can cause cell changes in the cervix. The cells collected during the Pap test can be tested for HPV.  The HPV test can be used to screen women 22 years of age and older. Getting tested for HPV can extend the interval between normal Pap tests from three to five years.  An HPV test also should be used to screen women of any age who have unclear Pap test  results.  After 74 years of age, women should have HPV testing as often as Pap tests.  Colorectal Cancer  This type of cancer can be detected and often prevented.  Routine colorectal cancer screening usually begins at 74 years of age and continues through 74 years of age.  Your health care provider may recommend screening at an earlier age if you have risk factors for colon cancer.  Your health care provider may also recommend using home test kits to check for hidden blood in the stool.  A small camera at the end of a tube can be used to examine your colon directly (sigmoidoscopy or colonoscopy). This is done to check for the earliest forms of colorectal cancer.  Routine screening usually begins at age 12.  Direct examination of the colon should be repeated every 5-10 years through 74 years of age. However, you may  need to be screened more often if early forms of precancerous polyps or small growths are found. Skin Cancer  Check your skin from head to toe regularly.  Tell your health care provider about any new moles or changes in moles, especially if there is a change in a mole's shape or color.  Also tell your health care provider if you have a mole that is larger than the size of a pencil eraser.  Always use sunscreen. Apply sunscreen liberally and repeatedly throughout the day.  Protect yourself by wearing long sleeves, pants, a wide-brimmed hat, and sunglasses whenever you are outside. HEART DISEASE, DIABETES, AND HIGH BLOOD PRESSURE   Have your blood pressure checked at least every 1-2 years. High blood pressure causes heart disease and increases the risk of stroke.  If you are between 55 years and 53 years old, ask your health care provider if you should take aspirin to prevent strokes.  Have regular diabetes screenings. This involves taking a blood sample to check your fasting blood sugar level.  If you are at a normal weight and have a low risk for diabetes, have this  test once every three years after 74 years of age.  If you are overweight and have a high risk for diabetes, consider being tested at a younger age or more often. PREVENTING INFECTION  Hepatitis B  If you have a higher risk for hepatitis B, you should be screened for this virus. You are considered at high risk for hepatitis B if:  You were born in a country where hepatitis B is common. Ask your health care provider which countries are considered high risk.  Your parents were born in a high-risk country, and you have not been immunized against hepatitis B (hepatitis B vaccine).  You have HIV or AIDS.  You use needles to inject street drugs.  You live with someone who has hepatitis B.  You have had sex with someone who has hepatitis B.  You get hemodialysis treatment.  You take certain medicines for conditions, including cancer, organ transplantation, and autoimmune conditions. Hepatitis C  Blood testing is recommended for:  Everyone born from [birth year] through 1966.  Anyone with known risk factors for hepatitis C. Sexually transmitted infections (STIs)  You should be screened for sexually transmitted infections (STIs) including gonorrhea and chlamydia if:  You are sexually active and are younger than 74 years of age.  You are older than 74 years of age and your health care provider tells you that you are at risk for this type of infection.  Your sexual activity has changed since you were last screened and you are at an increased risk for chlamydia or gonorrhea. Ask your health care provider if you are at risk.  If you do not have HIV, but are at risk, it may be recommended that you take a prescription medicine daily to prevent HIV infection. This is called pre-exposure prophylaxis (PrEP). You are considered at risk if:  You are sexually active and do not regularly use condoms or know the HIV status of your partner(s).  You take drugs by injection.  You are sexually active with  a partner who has HIV. Talk with your health care provider about whether you are at high risk of being infected with HIV. If you choose to begin PrEP, you should first be tested for HIV. You should then be tested every 3 months for as long as you are taking PrEP.  PREGNANCY   If you are  premenopausal and you may become pregnant, ask your health care provider about preconception counseling.  If you may become pregnant, take 400 to 800 micrograms (mcg) of folic acid every day.  If you want to prevent pregnancy, talk to your health care provider about birth control (contraception). OSTEOPOROSIS AND MENOPAUSE   Osteoporosis is a disease in which the bones lose minerals and strength with aging. This can result in serious bone fractures. Your risk for osteoporosis can be identified using a bone density scan.  If you are 46 years of age or older, or if you are at risk for osteoporosis and fractures, ask your health care provider if you should be screened.  Ask your health care provider whether you should take a calcium or vitamin D supplement to lower your risk for osteoporosis.  Menopause may have certain physical symptoms and risks.  Hormone replacement therapy may reduce some of these symptoms and risks. Talk to your health care provider about whether hormone replacement therapy is right for you.  HOME CARE INSTRUCTIONS   Schedule regular health, dental, and eye exams.  Stay current with your immunizations.   Do not use any tobacco products including cigarettes, chewing tobacco, or electronic cigarettes.  If you are pregnant, do not drink alcohol.  If you are breastfeeding, limit how much and how often you drink alcohol.  Limit alcohol intake to no more than 1 drink per day for nonpregnant women. One drink equals 12 ounces of beer, 5 ounces of wine, or 1 ounces of hard liquor.  Do not use street drugs.  Do not share needles.  Ask your health care provider for help if you need  support or information about quitting drugs.  Tell your health care provider if you often feel depressed.  Tell your health care provider if you have ever been abused or do not feel safe at home. Document Released: 08/17/2010 Document Revised: 06/18/2013 Document Reviewed: 01/03/2013 [facility] Patient Information 2015 [place], [place]. This information is not intended to replace advice given to you by your health care provider. Make sure you discuss any questions you have with your health care provider.

## 2013-11-13 ENCOUNTER — Encounter: Payer: Self-pay | Admitting: *Deleted

## 2013-12-04 ENCOUNTER — Other Ambulatory Visit: Payer: Self-pay | Admitting: Internal Medicine

## 2013-12-04 DIAGNOSIS — Z1231 Encounter for screening mammogram for malignant neoplasm of breast: Secondary | ICD-10-CM

## 2013-12-14 ENCOUNTER — Encounter (INDEPENDENT_AMBULATORY_CARE_PROVIDER_SITE_OTHER): Payer: Self-pay

## 2013-12-14 ENCOUNTER — Ambulatory Visit
Admission: RE | Admit: 2013-12-14 | Discharge: 2013-12-14 | Disposition: A | Discharge status: Home / Self Care | Payer: Medicare Other | Source: Ambulatory Visit | Attending: Internal Medicine | Admitting: Internal Medicine

## 2013-12-14 DIAGNOSIS — M810 Age-related osteoporosis without current pathological fracture: Secondary | ICD-10-CM

## 2013-12-18 ENCOUNTER — Telehealth: Payer: Self-pay | Admitting: Internal Medicine

## 2013-12-18 NOTE — Telephone Encounter (Signed)
I have electronically submitted pt's info for Prolia insurance verification and will notify you once I have a response. Thank you. °

## 2013-12-30 ENCOUNTER — Telehealth: Payer: Self-pay | Admitting: Internal Medicine

## 2013-12-30 DIAGNOSIS — M810 Age-related osteoporosis without current pathological fracture: Secondary | ICD-10-CM

## 2013-12-30 NOTE — Telephone Encounter (Signed)
Her repeat bone density test  To monitor her osteoporosis  Shows a decrease in her bone density since stopping Alendronate  Last year .  I am recommending therapy with twice a year injection of Prolia if her insurance will cover it.  I will start the initiation process.    Rita Taylor

## 2013-12-30 NOTE — Assessment & Plan Note (Signed)
  Repeat DEXA Nov 2015 show statistically significant decreased in BMD since stopping Alendronate .  I am recommending therapy with Prolia if insurance will cover it.

## 2013-12-31 NOTE — Telephone Encounter (Signed)
I have electronically sent pt's info for [place] verification and will notify you as soon as I have a response.  Thank you.

## 2014-01-01 NOTE — Telephone Encounter (Signed)
I have rec'd pt's Prolia insurance verification.  Pt's primary insurance, [employer] will cover 80% of the admin and Prolia, leaving pt w/a 20% co-insurance (approx. $180), however, her secondary insurance [employer] will cover the co-insurance and 100% of the excess charges.  This means the pt's estimated responsibility will be $0.  Please advise pt this is an estimate and we will not know the exact amt until both insurances have paid.  I have sent a copy of the summary of benefits to be scanned into pt's chart. If you have any questions, please let me know. Thank you.

## 2014-01-01 NOTE — Telephone Encounter (Signed)
Patient would like to proceed with Prolia injection.

## 2014-01-01 NOTE — Telephone Encounter (Signed)
Spoke with pt, scheduled nurse visit 01/08/14. Prolia ordered

## 2014-01-08 ENCOUNTER — Ambulatory Visit (INDEPENDENT_AMBULATORY_CARE_PROVIDER_SITE_OTHER): Payer: Medicare Other

## 2014-01-08 DIAGNOSIS — M810 Age-related osteoporosis without current pathological fracture: Secondary | ICD-10-CM | POA: Diagnosis not present

## 2014-01-08 MED ORDER — DENOSUMAB 60 MG/ML ~~LOC~~ SOLN
60.0000 mg | Freq: Once | SUBCUTANEOUS | Status: AC
  Administered 2014-01-08: 60 mg via SUBCUTANEOUS

## 2014-01-13 ENCOUNTER — Other Ambulatory Visit: Payer: Self-pay | Admitting: Internal Medicine

## 2014-01-15 ENCOUNTER — Encounter: Payer: Self-pay | Admitting: Internal Medicine

## 2014-01-16 NOTE — Telephone Encounter (Signed)
This was a duplicate request. See telephone note dated 12/18/2013.

## 2014-02-12 ENCOUNTER — Other Ambulatory Visit: Payer: Self-pay

## 2014-02-12 MED ORDER — LOSARTAN POTASSIUM 100 MG PO TABS: 100.0000 mg | ORAL_TABLET | Freq: Every day | ORAL | Status: DC

## 2014-04-24 DIAGNOSIS — D225 Melanocytic nevi of trunk: Secondary | ICD-10-CM | POA: Diagnosis not present

## 2014-04-24 DIAGNOSIS — D2261 Melanocytic nevi of right upper limb, including shoulder: Secondary | ICD-10-CM | POA: Diagnosis not present

## 2014-04-24 DIAGNOSIS — Z85820 Personal history of malignant melanoma of skin: Secondary | ICD-10-CM | POA: Diagnosis not present

## 2014-04-24 DIAGNOSIS — L821 Other seborrheic keratosis: Secondary | ICD-10-CM | POA: Diagnosis not present

## 2014-06-17 ENCOUNTER — Telehealth: Payer: Self-pay | Admitting: *Deleted

## 2014-06-17 DIAGNOSIS — E785 Hyperlipidemia, unspecified: Secondary | ICD-10-CM

## 2014-06-17 DIAGNOSIS — E559 Vitamin D deficiency, unspecified: Secondary | ICD-10-CM

## 2014-06-17 DIAGNOSIS — I10 Essential (primary) hypertension: Secondary | ICD-10-CM

## 2014-06-17 DIAGNOSIS — E871 Hypo-osmolality and hyponatremia: Secondary | ICD-10-CM

## 2014-06-17 NOTE — Telephone Encounter (Signed)
pended the labs, and sent message to order prolia. Please advise DX code used.

## 2014-06-17 NOTE — Telephone Encounter (Signed)
Labs ordered please schedule patient lab and OV. Prolia can be scheduled once received.

## 2014-06-17 NOTE — Telephone Encounter (Signed)
Only needs nonfasting labs,  So CMEt ordered.  prolia code is osteoporosis  M81.0  Thank you1

## 2014-06-18 NOTE — Telephone Encounter (Signed)
Pt scheduled and notified

## 2014-07-01 ENCOUNTER — Telehealth: Payer: Self-pay | Admitting: *Deleted

## 2014-07-01 ENCOUNTER — Other Ambulatory Visit (INDEPENDENT_AMBULATORY_CARE_PROVIDER_SITE_OTHER): Payer: Medicare Other

## 2014-07-01 DIAGNOSIS — E871 Hypo-osmolality and hyponatremia: Secondary | ICD-10-CM

## 2014-07-01 DIAGNOSIS — I10 Essential (primary) hypertension: Secondary | ICD-10-CM

## 2014-07-01 NOTE — Telephone Encounter (Signed)
Labs and dx?  

## 2014-07-01 NOTE — Telephone Encounter (Signed)
Just a cmet

## 2014-07-02 LAB — COMPLETE METABOLIC PANEL WITH GFR
ALBUMIN: 4.3 g/dL (ref 3.5–5.2)
ALT: 12 U/L (ref 0–35)
AST: 17 U/L (ref 0–37)
Alkaline Phosphatase: 42 U/L (ref 39–117)
BUN: 19 mg/dL (ref 6–23)
CALCIUM: 9.2 mg/dL (ref 8.4–10.5)
CHLORIDE: 95 meq/L — AB (ref 96–112)
CO2: 31 meq/L (ref 19–32)
Creat: 0.84 mg/dL (ref 0.50–1.10)
GFR, Est African American: 79 mL/min
GFR, Est Non African American: 69 mL/min
Glucose, Bld: 113 mg/dL — ABNORMAL HIGH (ref 70–99)
POTASSIUM: 4.6 meq/L (ref 3.5–5.3)
Sodium: 132 mEq/L — ABNORMAL LOW (ref 135–145)
Total Bilirubin: 0.5 mg/dL (ref 0.2–1.2)
Total Protein: 6.4 g/dL (ref 6.0–8.3)

## 2014-07-04 ENCOUNTER — Encounter: Payer: Self-pay | Admitting: Internal Medicine

## 2014-07-04 ENCOUNTER — Telehealth: Payer: Self-pay

## 2014-07-04 ENCOUNTER — Ambulatory Visit (INDEPENDENT_AMBULATORY_CARE_PROVIDER_SITE_OTHER): Payer: Medicare Other | Admitting: Internal Medicine

## 2014-07-04 VITALS — BP 158/72 | HR 66 | Temp 96.9°F | Resp 14 | Ht 63.0 in | Wt 115.5 lb

## 2014-07-04 DIAGNOSIS — E871 Hypo-osmolality and hyponatremia: Secondary | ICD-10-CM | POA: Diagnosis not present

## 2014-07-04 DIAGNOSIS — I10 Essential (primary) hypertension: Secondary | ICD-10-CM | POA: Diagnosis not present

## 2014-07-04 DIAGNOSIS — M810 Age-related osteoporosis without current pathological fracture: Secondary | ICD-10-CM | POA: Diagnosis not present

## 2014-07-04 DIAGNOSIS — R634 Abnormal weight loss: Secondary | ICD-10-CM

## 2014-07-04 NOTE — Telephone Encounter (Signed)
Patient is due for her Prolia Injection next week.  Can we please start the PA process for her as it is a new calendar year.  Thanks, Lavella Lemons

## 2014-07-04 NOTE — Patient Instructions (Signed)
Your blood pressure is fine and your labs are normal   We will call  you when the Prolia has  Been received .  I recommend getting the majority of your calcium and Vitamin D  through dietary sources rather than supplements given the recent association of calcium supplements with increased coronary artery calcium scores    almond/coconut milk  and cashew milk are great nondairy alternatives to dairy sources and are  low calorie low carb, and cholesterol free .

## 2014-07-04 NOTE — Progress Notes (Signed)
Pre-visit discussion using our clinic review tool. No additional management support is needed unless otherwise documented below in the visit note.  

## 2014-07-05 NOTE — Telephone Encounter (Signed)
I have rec'd patient's insurance verification for Prolia.  Rita Taylor has an estimated responsibility of 0% co-insurance plus $166 deductible (if she hasn't already met it).  Please advise her this is an estimate and we will not know an exact amt until both of her insurances have paid. I have sent a copy of her summary of benefits to be scanned into chart.   If Rita Taylor has not met her $166 deductible and cannot afford that for her injection, please advise her to contact [employer] at [phone] and select option 1 to see if she qualifies for one of their assistance programs.  If she qualifies they will instruct her how to proceed. Thank you.

## 2014-07-06 DIAGNOSIS — R634 Abnormal weight loss: Secondary | ICD-10-CM | POA: Insufficient documentation

## 2014-07-06 NOTE — Assessment & Plan Note (Addendum)
Etiology unclear, and  Given her history of melanoma ,  Concern for malignancy is a concern but she has no ominous signs or symptoms of cancer. I have reviewed her diet and recommended that she increase her protein and fat intake while monitoring her carbohydrates.   Lab Results  Component Value Date   TSH 1.07 11/12/2013   Lab Results  Component Value Date   ALT 12 07/01/2014   AST 17 07/01/2014   ALKPHOS 42 07/01/2014   BILITOT 0.5 07/01/2014   Lab Results  Component Value Date   CREATININE 0.84 07/01/2014

## 2014-07-06 NOTE — Assessment & Plan Note (Signed)
Stable secondary to anti hypertensives.  She has a history of intolerances to multiple medications .

## 2014-07-06 NOTE — Assessment & Plan Note (Addendum)
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  The lapse in medication resulted in loss of density,  Next injection is due late May.  Discussed the current controversies surrounding the risks and benefits of calcium supplementation.  Encouraged her to increase dietary calcium through natural foods including almond/coconut milk.  Encourage to resume regular weight bearing exercise

## 2014-07-06 NOTE — Progress Notes (Signed)
Subjective:  Patient ID: Rita Taylor, female    DOB: 31-Jul-1940  Age: 74 y.o. MRN: [id number]  CC: The primary encounter diagnosis was Loss of weight. Diagnoses of Osteoporosis, postmenopausal, Essential hypertension, and Hyponatremia were also pertinent to this visit.  HPI Rita Taylor presents for follow up on hypertension and osteoporosis . She has been tolerating her medications without side effects,  But has stopped workiimg out at the gym and has lost  11 lbs.  Denies fatigue, anorexia, pain, night sweats.  Up to date on colon ca screening and breast ca screening.    She has had no falls,  And is due of for Prolia at the endo fo the month.   Outpatient Prescriptions Prior to Visit  Medication Sig Dispense Refill  . aspirin 81 MG tablet Take 81 mg by mouth daily.      . Calcium Carbonate-Vit D-Min (CALCIUM 1200) 1200-1000 MG-UNIT CHEW Chew 1 tablet by mouth daily.      Marland Kitchen losartan (COZAAR) 100 MG tablet Take 1 tablet (100 mg total) by mouth daily. 90 tablet 1  . triamterene-hydrochlorothiazide (MAXZIDE-25) 37.5-25 MG per tablet take 1 tablet by mouth once daily 30 tablet 5   No facility-administered medications prior to visit.    Review of Systems;  Patient denies headache, fevers, malaise,  skin rash, eye pain, sinus congestion and sinus pain, sore throat, dysphagia,  hemoptysis , cough, dyspnea, wheezing, chest pain, palpitations, orthopnea, edema, abdominal pain, nausea, melena, diarrhea, constipation, flank pain, dysuria, hematuria, urinary  Frequency, nocturia, numbness, tingling, seizures,  Focal weakness, Loss of consciousness,  Tremor, insomnia, depression, anxiety, and suicidal ideation.      Objective:  BP 158/72 mmHg  Pulse 66  Temp(Src) 96.9 F (36.1 C) (Oral)  Resp 14  Ht 5\' 3"  (1.6 m)  Wt 115 lb 8 oz (52.39 kg)  BMI 20.46 kg/m2  SpO2 99%  BP Readings from Last 3 Encounters:  07/04/14 158/72  11/12/13 142/76  11/08/12 174/80    Wt Readings from  Last 3 Encounters:  07/04/14 115 lb 8 oz (52.39 kg)  11/12/13 126 lb 4 oz (57.267 kg)  11/08/12 126 lb 8 oz (57.38 kg)    General appearance: alert, cooperative and appears stated age Ears: normal TM's and external ear canals both ears Throat: lips, mucosa, and tongue normal; teeth and gums normal Neck: no adenopathy, no carotid bruit, supple, symmetrical, trachea midline and thyroid not enlarged, symmetric, no tenderness/mass/nodules Back: symmetric, no curvature. ROM normal. No CVA tenderness. Lungs: clear to auscultation bilaterally Heart: regular rate and rhythm, S1, S2 normal, no murmur, click, rub or gallop Abdomen: soft, non-tender; bowel sounds normal; no masses,  no organomegaly Pulses: 2+ and symmetric Skin: Skin color, texture, turgor normal. No rashes or lesions Lymph nodes: Cervical, supraclavicular, and axillary nodes normal.  No results found for: HGBA1C  Lab Results  Component Value Date   CREATININE 0.84 07/01/2014   CREATININE 0.7 11/12/2013   CREATININE 0.8 07/11/2013    Lab Results  Component Value Date   WBC 7.3 11/12/2013   HGB 12.8 11/12/2013   HCT 38.4 11/12/2013   PLT 239.0 11/12/2013   GLUCOSE 113* 07/01/2014   CHOL 245* 11/12/2013   TRIG 72.0 11/12/2013   HDL 80.80 11/12/2013   LDLDIRECT 142.6 11/08/2012   LDLCALC 150* 11/12/2013   ALT 12 07/01/2014   AST 17 07/01/2014   NA 132* 07/01/2014   K 4.6 07/01/2014   CL 95* 07/01/2014   CREATININE 0.84  07/01/2014   BUN 19 07/01/2014   CO2 31 07/01/2014   TSH 1.07 11/12/2013   MICROALBUR 0.1 01/26/2012    Dg Bone Density  12/14/2013   CLINICAL DATA:  74 year old postmenopausal female.  EXAM: DUAL X-RAY ABSORPTIOMETRY (DXA) FOR BONE MINERAL DENSITY  FINDINGS: AP LUMBAR SPINE L1-L4  Bone Mineral Density (BMD):  0.722 g/cm2  Young Adult T-Score:  -3.0  Z-Score:  -0.6  LEFT FEMUR NECK  Bone Mineral Density (BMD):  0.612 g/cm2  Young Adult T-Score: -2.1  Z-Score:  -0.1  ASSESSMENT: Patient's  diagnostic category is osteoporosis by WHO Criteria.  FRACTURE RISK: Increased  FRAX: World Health Organization FRAX assessment of absolute fracture risk is not calculated for this patient because the patient has osteoporosis.  COMPARISON: When compared to baseline evaluation there has been no statistically significant interval change in bone mineral density of the lumbar spine or left hip. When compared to previous evaluation 11/11/2011 there has been a statistically significant interval decrease in bone mineral density of the lumbar spine of 4.1% and a statistically significant interval decrease in bone mineral density of the left hip of 4.5%.  Effective therapies are available in the form of bisphosphonates, selective estrogen receptor modulators, biologic agents, and hormone replacement therapy (for women). All patients should ensure an adequate intake of dietary calcium (1200 mg daily) and vitamin D (800 IU daily) unless contraindicated.  All treatment decisions require clinical judgment and consideration of individual patient factors, including patient preferences, co-morbidities, previous drug use, risk factors not captured in the FRAX model (e.g., frailty, falls, vitamin D deficiency, increased bone turnover, interval significant decline in bone density) and possible under- or over-estimation of fracture risk by FRAX.  The National Osteoporosis Foundation recommends that FDA-approved medical therapies be considered in postmenopausal women and men age 64 or older with a:  1. Hip or vertebral (clinical or morphometric) fracture.  2. T-score of -2.5 or lower at the spine or hip.  3. Ten-year fracture probability by FRAX of 3% or greater for hip fracture or 20% or greater for major osteoporotic fracture.  People with diagnosed cases of osteoporosis or at high risk for fracture should have regular bone mineral density tests. For patients eligible for Medicare, routine testing is allowed once every 2 years. The  testing frequency can be increased to one year for patients who have rapidly progressing disease, those who are receiving or discontinuing medical therapy to restore bone mass, or have additional risk factors.  World Pharmacologist [facility]) Criteria:  Normal: T-scores from +1.0 to -1.0  Low Bone Mass (Osteopenia): T-scores between -1.0 and -2.5  Osteoporosis: T-scores -2.5 and below  Comparison to Reference Population:  T-score is the key measure used in the diagnosis of osteoporosis and relative risk determination for fracture. It provides a value for bone mass relative to the mean bone mass of a young adult reference population expressed in terms of standard deviation (SD).  Z-score is the age-matched score showing the patient's values compared to a population matched for age, sex, and race. This is also expressed in terms of standard deviation. The patient may have values that compare favorably to the age-matched values and still be at increased risk for fracture.   Electronically Signed   By: Lovey Newcomer M.D.   On: 12/14/2013 16:57    Assessment & Plan:   Problem List Items Addressed This Visit    Hypertension    Elevated due to white coat hypertension.  Home readings have been  Consistently < 140/80.  Lab Results  Component Value Date   CREATININE 0.84 07/01/2014   Lab Results  Component Value Date   NA 132* 07/01/2014   K 4.6 07/01/2014   CL 95* 07/01/2014   CO2 31 07/01/2014         Hyponatremia    Stable secondary to anti hypertensives.  She has a history of intolerances to multiple medications .       Osteoporosis, postmenopausal    Now managed with Prolia since stopping alendronate in 2013 due to hives.  The lapse in medication resulted in loss of density,  Next injection is due late May.  Discussed the current controversies surrounding the risks and benefits of calcium supplementation.  Encouraged her to increase dietary calcium through natural foods including almond/coconut  milk.  Encourage to resume regular weight bearing exercise       Loss of weight - Primary    Etiology unclear, and  Given her history of melanoma ,  Concern for malignancy is a concern but she has no ominous signs or symptoms of cancer.  Lab Results  Component Value Date   TSH 1.07 11/12/2013   Lab Results  Component Value Date   ALT 12 07/01/2014   AST 17 07/01/2014   ALKPHOS 42 07/01/2014   BILITOT 0.5 07/01/2014   Lab Results  Component Value Date   CREATININE 0.84 07/01/2014            I am having Ms. Schlender maintain her aspirin, CALCIUM 1200, triamterene-hydrochlorothiazide, and losartan.  No orders of the defined types were placed in this encounter.    There are no discontinued medications.  Follow-up: Return in about 6 months (around 01/04/2015).   Crecencio Mc, MD

## 2014-07-06 NOTE — Assessment & Plan Note (Addendum)
Elevated due to white coat hypertension.  Home readings have been  Consistently < 140/80.  Lab Results  Component Value Date   CREATININE 0.84 07/01/2014   Lab Results  Component Value Date   NA 132* 07/01/2014   K 4.6 07/01/2014   CL 95* 07/01/2014   CO2 31 07/01/2014

## 2014-07-08 ENCOUNTER — Other Ambulatory Visit: Payer: Self-pay | Admitting: Internal Medicine

## 2014-07-18 ENCOUNTER — Ambulatory Visit: Payer: Medicare Other | Admitting: Nurse Practitioner

## 2014-07-19 ENCOUNTER — Ambulatory Visit: Payer: Medicare Other | Admitting: Nurse Practitioner

## 2014-07-19 NOTE — Telephone Encounter (Signed)
Spoke with the patient regarding the outcome of the PA.  She is going to look into the Assistance program as she has not met the $166 deductible yet.  She will return a call to me with further instructions and scheduling the appointment.

## 2014-07-19 NOTE — Telephone Encounter (Signed)
Patient spoke with her insurance and she has meet the deductible already this year.  Becky to order the medication and appointment scheduled  For 6.9.2016 at 2pm.

## 2014-07-19 NOTE — Telephone Encounter (Signed)
Ordered

## 2014-07-25 ENCOUNTER — Ambulatory Visit (INDEPENDENT_AMBULATORY_CARE_PROVIDER_SITE_OTHER): Payer: Medicare Other | Admitting: *Deleted

## 2014-07-25 DIAGNOSIS — M8580 Other specified disorders of bone density and structure, unspecified site: Secondary | ICD-10-CM

## 2014-07-25 MED ORDER — DENOSUMAB 60 MG/ML ~~LOC~~ SOLN
60.0000 mg | Freq: Once | SUBCUTANEOUS | Status: AC
  Administered 2014-07-25: 60 mg via SUBCUTANEOUS

## 2014-08-07 ENCOUNTER — Other Ambulatory Visit: Payer: Self-pay | Admitting: Internal Medicine

## 2014-10-17 DIAGNOSIS — L821 Other seborrheic keratosis: Secondary | ICD-10-CM | POA: Diagnosis not present

## 2014-11-06 DIAGNOSIS — Z23 Encounter for immunization: Secondary | ICD-10-CM | POA: Diagnosis not present

## 2014-11-19 ENCOUNTER — Telehealth: Payer: Self-pay | Admitting: *Deleted

## 2014-11-19 NOTE — Telephone Encounter (Signed)
Patient  Stated that a little over two weeks, she has use tylenol to control her hip pain, however, the tylenol has stopped working efficiently. Patient was concerned if she should bee seen, or continue to take tylenol extra strength.

## 2014-11-20 NOTE — Telephone Encounter (Signed)
Acute visit scheduled. 

## 2014-11-22 ENCOUNTER — Ambulatory Visit (INDEPENDENT_AMBULATORY_CARE_PROVIDER_SITE_OTHER): Payer: Medicare Other | Admitting: Internal Medicine

## 2014-11-22 ENCOUNTER — Encounter: Payer: Self-pay | Admitting: Internal Medicine

## 2014-11-22 VITALS — BP 148/70 | HR 69 | Temp 97.7°F | Resp 12 | Ht 63.0 in | Wt 118.2 lb

## 2014-11-22 DIAGNOSIS — M7072 Other bursitis of hip, left hip: Secondary | ICD-10-CM | POA: Diagnosis not present

## 2014-11-22 MED ORDER — TRAMADOL HCL 50 MG PO TABS: 50.0000 mg | ORAL_TABLET | Freq: Three times a day (TID) | ORAL | Status: DC | PRN

## 2014-11-22 MED ORDER — PREDNISONE 10 MG PO TABS: ORAL_TABLET | ORAL | Status: DC

## 2014-11-22 NOTE — Progress Notes (Signed)
Subjective:  Patient ID: Rita Taylor, female    DOB: 1940/03/08  Age: 74 y.o. MRN: [id number]  CC: The encounter diagnosis was Bursitis of left hip.  HPI [name] presents for left hip pain .  The pain has been recurring nightly for the past 4 weeks.  The pain is present at night and is interrupting her sleep, because she can't find a comfortable position in bed. She reports waking up pain in the sciatic notch.  The pain has become more severe.  The pain resolves once she is up and walking but recurs with prolonged inactivity.  She has no history of fall or unusual activity.  She denies any history of low back pain .   Outpatient Prescriptions Prior to Visit  Medication Sig Dispense Refill  . aspirin 81 MG tablet Take 81 mg by mouth daily.      Marland Kitchen losartan (COZAAR) 100 MG tablet take 1 tablet by mouth once daily 90 tablet 1  . triamterene-hydrochlorothiazide (MAXZIDE-25) 37.5-25 MG per tablet take 1 tablet by mouth once daily 30 tablet 5  . Calcium Carbonate-Vit D-Min (CALCIUM 1200) 1200-1000 MG-UNIT CHEW Chew 1 tablet by mouth daily.       No facility-administered medications prior to visit.    Review of Systems;  Patient denies headache, fevers, malaise, unintentional weight loss, skin rash, eye pain, sinus congestion and sinus pain, sore throat, dysphagia,  hemoptysis , cough, dyspnea, wheezing, chest pain, palpitations, orthopnea, edema, abdominal pain, nausea, melena, diarrhea, constipation, flank pain, dysuria, hematuria, urinary  Frequency, nocturia, numbness, tingling, seizures,  Focal weakness, Loss of consciousness,  Tremor, insomnia, depression, anxiety, and suicidal ideation.      Objective:  BP 148/70 mmHg  Pulse 69  Temp(Src) 97.7 F (36.5 C) (Oral)  Resp 12  Ht 5\' 3"  (1.6 m)  Wt 118 lb 4 oz (53.638 kg)  BMI 20.95 kg/m2  SpO2 100%  BP Readings from Last 3 Encounters:  11/22/14 148/70  07/04/14 158/72  11/12/13 142/76    Wt Readings from Last 3  Encounters:  11/22/14 118 lb 4 oz (53.638 kg)  07/04/14 115 lb 8 oz (52.39 kg)  11/12/13 126 lb 4 oz (57.267 kg)    General appearance: alert, cooperative and appears stated age Back: symmetric, no curvature. ROM normal. No CVA tenderness. Lungs: clear to auscultation bilaterally Heart: regular rate and rhythm, S1, S2 normal, no murmur, click, rub or gallop Abdomen: soft, non-tender; bowel sounds normal; no masses,  no organomegaly Pulses: 2+ and symmetric Skin: Skin color, texture, turgor normal. No rashes or lesions MSK: normal ROM of both hips without pain,  Tenderness over ischial tuberosity Lymph nodes: Cervical, supraclavicular, and axillary nodes normal.  No results found for: HGBA1C  Lab Results  Component Value Date   CREATININE 0.84 07/01/2014   CREATININE 0.7 11/12/2013   CREATININE 0.8 07/11/2013    Lab Results  Component Value Date   WBC 7.3 11/12/2013   HGB 12.8 11/12/2013   HCT 38.4 11/12/2013   PLT 239.0 11/12/2013   GLUCOSE 113* 07/01/2014   CHOL 245* 11/12/2013   TRIG 72.0 11/12/2013   HDL 80.80 11/12/2013   LDLDIRECT 142.6 11/08/2012   LDLCALC 150* 11/12/2013   ALT 12 07/01/2014   AST 17 07/01/2014   NA 132* 07/01/2014   K 4.6 07/01/2014   CL 95* 07/01/2014   CREATININE 0.84 07/01/2014   BUN 19 07/01/2014   CO2 31 07/01/2014   TSH 1.07 11/12/2013   MICROALBUR 0.1  01/26/2012    Dg Bone Density  12/14/2013   CLINICAL DATA:  74 year old postmenopausal female.  EXAM: DUAL X-RAY ABSORPTIOMETRY (DXA) FOR BONE MINERAL DENSITY  FINDINGS: AP LUMBAR SPINE L1-L4  Bone Mineral Density (BMD):  0.722 g/cm2  Young Adult T-Score:  -3.0  Z-Score:  -0.6  LEFT FEMUR NECK  Bone Mineral Density (BMD):  0.612 g/cm2  Young Adult T-Score: -2.1  Z-Score:  -0.1  ASSESSMENT: Patient's diagnostic category is osteoporosis by WHO Criteria.  FRACTURE RISK: Increased  FRAX: World Health Organization FRAX assessment of absolute fracture risk is not calculated for this patient  because the patient has osteoporosis.  COMPARISON: When compared to baseline evaluation there has been no statistically significant interval change in bone mineral density of the lumbar spine or left hip. When compared to previous evaluation 11/11/2011 there has been a statistically significant interval decrease in bone mineral density of the lumbar spine of 4.1% and a statistically significant interval decrease in bone mineral density of the left hip of 4.5%.  Effective therapies are available in the form of bisphosphonates, selective estrogen receptor modulators, biologic agents, and hormone replacement therapy (for women). All patients should ensure an adequate intake of dietary calcium (1200 mg daily) and vitamin D (800 IU daily) unless contraindicated.  All treatment decisions require clinical judgment and consideration of individual patient factors, including patient preferences, co-morbidities, previous drug use, risk factors not captured in the FRAX model (e.g., frailty, falls, vitamin D deficiency, increased bone turnover, interval significant decline in bone density) and possible under- or over-estimation of fracture risk by FRAX.  The National Osteoporosis Foundation recommends that FDA-approved medical therapies be considered in postmenopausal women and men age 79 or older with a:  1. Hip or vertebral (clinical or morphometric) fracture.  2. T-score of -2.5 or lower at the spine or hip.  3. Ten-year fracture probability by FRAX of 3% or greater for hip fracture or 20% or greater for major osteoporotic fracture.  People with diagnosed cases of osteoporosis or at high risk for fracture should have regular bone mineral density tests. For patients eligible for Medicare, routine testing is allowed once every 2 years. The testing frequency can be increased to one year for patients who have rapidly progressing disease, those who are receiving or discontinuing medical therapy to restore bone mass, or have  additional risk factors.  World Pharmacologist [facility]) Criteria:  Normal: T-scores from +1.0 to -1.0  Low Bone Mass (Osteopenia): T-scores between -1.0 and -2.5  Osteoporosis: T-scores -2.5 and below  Comparison to Reference Population:  T-score is the key measure used in the diagnosis of osteoporosis and relative risk determination for fracture. It provides a value for bone mass relative to the mean bone mass of a young adult reference population expressed in terms of standard deviation (SD).  Z-score is the age-matched score showing the patient's values compared to a population matched for age, sex, and race. This is also expressed in terms of standard deviation. The patient may have values that compare favorably to the age-matched values and still be at increased risk for fracture.   Electronically Signed   By: Lovey Newcomer M.D.   On: 12/14/2013 16:57    Assessment & Plan:   Problem List Items Addressed This Visit    Bursitis of left hip - Primary    Vs OA given the improvement of symptoms with activity. Discussed referral to orthopedics for steroid injection, but she would prefer to trial a trial of prednisone first.  Tramadol rx given for pain .         I am having Ms. Heintzelman start on predniSONE and traMADol. I am also having her maintain her aspirin, CALCIUM 1200, triamterene-hydrochlorothiazide, and losartan.  Meds ordered this encounter  Medications  . predniSONE (DELTASONE) 10 MG tablet    Sig: 6 tablets on Day 1 , then reduce by 1 tablet daily until gone    Dispense:  21 tablet    Refill:  0  . traMADol (ULTRAM) 50 MG tablet    Sig: Take 1 tablet (50 mg total) by mouth every 8 (eight) hours as needed.    Dispense:  60 tablet    Refill:  2    There are no discontinued medications.  Follow-up: No Follow-up on file.   Crecencio Mc, MD

## 2014-11-22 NOTE — Progress Notes (Signed)
Pre-visit discussion using our clinic review tool. No additional management support is needed unless otherwise documented below in the visit note.  

## 2014-11-22 NOTE — Patient Instructions (Addendum)
Your pain is over the ischial tuberosity .  This is the  bony part of the pelvis where the muscles in the back of the thigh (hamstring muscles) attach to tendons. These muscles are important in straightening the hip and bending the knee.   I am treating you for bursitis of this joint with a prednisone taper. For 6 days,  You can also take the tramadol at bedtime for pain.  You can use colace if the tramadol causes constipation.  If the pain does nor resolve,  Or if it returns,  I will make a referral to a sports medicine doctor/orthopedist to consider a joint injection

## 2014-11-23 NOTE — Assessment & Plan Note (Addendum)
Vs OA given the improvement of symptoms with activity. Discussed referral to orthopedics for steroid injection, but she would prefer to trial a trial of prednisone first. Tramadol rx given for pain .

## 2014-11-27 ENCOUNTER — Telehealth: Payer: Self-pay | Admitting: Internal Medicine

## 2014-11-27 DIAGNOSIS — M7072 Other bursitis of hip, left hip: Secondary | ICD-10-CM

## 2014-11-27 NOTE — Telephone Encounter (Signed)
Melissa can you assist with this? Thanks 

## 2014-11-27 NOTE — Telephone Encounter (Signed)
Please advise 

## 2014-11-27 NOTE — Telephone Encounter (Signed)
Pt called stating that the pain in her left hip came back. Pt states she had 4 painless days with predniSONE (DELTASONE) 10 MG tablet , pain came back on the 5th day even on prednisone. Pt states Dr Derrel Nip stated she would send pt to get a shot. Thank You!

## 2014-11-27 NOTE — Telephone Encounter (Signed)
Referral is in process as requested to Orthopedics

## 2014-12-03 DIAGNOSIS — M533 Sacrococcygeal disorders, not elsewhere classified: Secondary | ICD-10-CM | POA: Diagnosis not present

## 2014-12-28 ENCOUNTER — Other Ambulatory Visit: Payer: Self-pay | Admitting: Internal Medicine

## 2015-01-06 ENCOUNTER — Ambulatory Visit (INDEPENDENT_AMBULATORY_CARE_PROVIDER_SITE_OTHER): Payer: Medicare Other | Admitting: Internal Medicine

## 2015-01-06 ENCOUNTER — Encounter: Payer: Self-pay | Admitting: Internal Medicine

## 2015-01-06 VITALS — BP 136/78 | HR 79 | Temp 98.6°F | Resp 18 | Wt 114.0 lb

## 2015-01-06 DIAGNOSIS — E559 Vitamin D deficiency, unspecified: Secondary | ICD-10-CM | POA: Diagnosis not present

## 2015-01-06 DIAGNOSIS — I10 Essential (primary) hypertension: Secondary | ICD-10-CM | POA: Diagnosis not present

## 2015-01-06 DIAGNOSIS — Z1159 Encounter for screening for other viral diseases: Secondary | ICD-10-CM

## 2015-01-06 DIAGNOSIS — R634 Abnormal weight loss: Secondary | ICD-10-CM

## 2015-01-06 DIAGNOSIS — M47818 Spondylosis without myelopathy or radiculopathy, sacral and sacrococcygeal region: Secondary | ICD-10-CM | POA: Insufficient documentation

## 2015-01-06 DIAGNOSIS — Z515 Encounter for palliative care: Secondary | ICD-10-CM | POA: Insufficient documentation

## 2015-01-06 DIAGNOSIS — Z0000 Encounter for general adult medical examination without abnormal findings: Secondary | ICD-10-CM | POA: Insufficient documentation

## 2015-01-06 DIAGNOSIS — M461 Sacroiliitis, not elsewhere classified: Secondary | ICD-10-CM

## 2015-01-06 DIAGNOSIS — Z1239 Encounter for other screening for malignant neoplasm of breast: Secondary | ICD-10-CM

## 2015-01-06 LAB — LIPID PANEL
Cholesterol: 236 mg/dL — ABNORMAL HIGH (ref 0–200)
HDL: 86.4 mg/dL (ref >39.00)
LDL Cholesterol: 136 mg/dL — ABNORMAL HIGH (ref 0–99)
NONHDL: 149.79
Total CHOL/HDL Ratio: 3
Triglycerides: 69 mg/dL (ref 0.0–149.0)
VLDL: 13.8 mg/dL (ref 0.0–40.0)

## 2015-01-06 LAB — COMPREHENSIVE METABOLIC PANEL
ALK PHOS: 61 U/L (ref 39–117)
ALT: 16 U/L (ref 0–35)
AST: 18 U/L (ref 0–37)
Albumin: 4.6 g/dL (ref 3.5–5.2)
BILIRUBIN TOTAL: 0.6 mg/dL (ref 0.2–1.2)
BUN: 16 mg/dL (ref 6–23)
CALCIUM: 9.2 mg/dL (ref 8.4–10.5)
CO2: 30 mEq/L (ref 19–32)
Chloride: 94 mEq/L — ABNORMAL LOW (ref 96–112)
Creatinine, Ser: 0.76 mg/dL (ref 0.40–1.20)
GFR: 78.82 mL/min (ref >60.00)
Glucose, Bld: 98 mg/dL (ref 70–99)
Potassium: 3.4 mEq/L — ABNORMAL LOW (ref 3.5–5.1)
Sodium: 133 mEq/L — ABNORMAL LOW (ref 135–145)
TOTAL PROTEIN: 7.2 g/dL (ref 6.0–8.3)

## 2015-01-06 LAB — LDL CHOLESTEROL, DIRECT: LDL DIRECT: 137 mg/dL

## 2015-01-06 LAB — VITAMIN D 25 HYDROXY (VIT D DEFICIENCY, FRACTURES): VITD: 38.86 ng/mL (ref 30.00–100.00)

## 2015-01-06 LAB — TSH: TSH: 0.89 u[IU]/mL (ref 0.35–4.50)

## 2015-01-06 NOTE — Assessment & Plan Note (Signed)
Left hip, now resolved.  Not currently on any NSAIDs,

## 2015-01-06 NOTE — Assessment & Plan Note (Signed)
Secondary to white coat hypertension. Per 24 ambulatory monitor

## 2015-01-06 NOTE — Progress Notes (Signed)
Pre visit review using our clinic review tool, if applicable. No additional management support is needed unless otherwise documented below in the visit note. 

## 2015-01-06 NOTE — Assessment & Plan Note (Signed)
Annualcomprehensive physical exam was done.  During the course of the visit the patient was educated and counseled about appropriate screening and preventive services including :  diabetes screening, lipid analysis with projected  10 year  risk for CAD , nutrition counseling, colorectal cancer screening, and recommended immunizations.  Printed recommendations for health maintenance screenings was given.

## 2015-01-06 NOTE — Assessment & Plan Note (Signed)
Checking for thyroid and diabetes.  I have reviewed her diet and recommended that she increase her protein and fat intake while monitoring her carbohydrates.

## 2015-01-06 NOTE — Patient Instructions (Addendum)
You can use fish oil (1000 mg ) and turmeric(  500 mg ) on a  twice daily basis for minor aches /arthritis   Practice standing on one leg for 10 seconds,  Switch sides..  Hip Raises:   Stand on one leg next the wall.  Let the opposite hip  Drop (keep leg bent and suspended,  Foot not touching ground).  The use the buttock muscles on the side closest to the wall to lift the sagging hip up .  Repeat ten times per side   Practice getting up from a chair 10 times.  Repeat these exercises 3 times daily    I recommend getting at least half of your calcium and Vitamin D  through diet rather than supplements given the recent association of calcium supplements with increased coronary artery calcium scores  Try   Silk almond Light,  Original formula.  Delicious,  Low carb,  Low cal,  Cholesterol free  Califa Frams; toasted coconut/almond mild ([employer])  Orgain: protein fortified almond milk  (in the cereal aisle at [employer])   Menopause is a normal process in which your reproductive ability comes to an end. This process happens gradually over a span of months to years, usually between the ages of 52 and 6. Menopause is complete when you have missed 12 consecutive menstrual periods. It is important to talk with your health care provider about some of the most common conditions that affect postmenopausal women, such as heart disease, cancer, and bone loss (osteoporosis). Adopting a healthy lifestyle and getting preventive care can help to promote your health and wellness. Those actions can also lower your chances of developing some of these common conditions. WHAT SHOULD I KNOW ABOUT MENOPAUSE? During menopause, you may experience a number of symptoms, such as:  Moderate-to-severe hot flashes.  Night sweats.  Decrease in sex drive.  Mood swings.  Headaches.  Tiredness.  Irritability.  Memory problems.  Insomnia. Choosing to treat or not to treat menopausal changes is an individual  decision that you make with your health care provider. WHAT SHOULD I KNOW ABOUT HORMONE REPLACEMENT THERAPY AND SUPPLEMENTS? Hormone therapy products are effective for treating symptoms that are associated with menopause, such as hot flashes and night sweats. Hormone replacement carries certain risks, especially as you become older. If you are thinking about using estrogen or estrogen with progestin treatments, discuss the benefits and risks with your health care provider. WHAT SHOULD I KNOW ABOUT HEART DISEASE AND STROKE? Heart disease, heart attack, and stroke become more likely as you age. This may be due, in part, to the hormonal changes that your body experiences during menopause. These can affect how your body processes dietary fats, triglycerides, and cholesterol. Heart attack and stroke are both medical emergencies. There are many things that you can do to help prevent heart disease and stroke:  Have your blood pressure checked at least every 1-2 years. High blood pressure causes heart disease and increases the risk of stroke.  If you are 20-52 years old, ask your health care provider if you should take aspirin to prevent a heart attack or a stroke.  Do not use any tobacco products, including cigarettes, chewing tobacco, or electronic cigarettes. If you need help quitting, ask your health care provider.  It is important to eat a healthy diet and maintain a healthy weight.  Be sure to include plenty of vegetables, fruits, low-fat dairy products, and lean protein.  Avoid eating foods that are high in solid  fats, added sugars, or salt (sodium).  Get regular exercise. This is one of the most important things that you can do for your health.  Try to exercise for at least 150 minutes each week. The type of exercise that you do should increase your heart rate and make you sweat. This is known as moderate-intensity exercise.  Try to do strengthening exercises at least twice each week. Do these  in addition to the moderate-intensity exercise.  Know your numbers.Ask your health care provider to check your cholesterol and your blood glucose. Continue to have your blood tested as directed by your health care provider. WHAT SHOULD I KNOW ABOUT CANCER SCREENING? There are several types of cancer. Take the following steps to reduce your risk and to catch any cancer development as early as possible. Breast Cancer  Practice breast self-awareness.  This means understanding how your breasts normally appear and feel.  It also means doing regular breast self-exams. Let your health care provider know about any changes, no matter how small.  If you are 70 or older, have a clinician do a breast exam (clinical breast exam or CBE) every year. Depending on your age, family history, and medical history, it may be recommended that you also have a yearly breast X-ray (mammogram).  If you have a family history of breast cancer, talk with your health care provider about genetic screening.  If you are at high risk for breast cancer, talk with your health care provider about having an MRI and a mammogram every year.  Breast cancer (BRCA) gene test is recommended for women who have family members with BRCA-related cancers. Results of the assessment will determine the need for genetic counseling and BRCA1 and for BRCA2 testing. BRCA-related cancers include these types:  Breast. This occurs in males or females.  Ovarian.  Tubal. This may also be called fallopian tube cancer.  Cancer of the abdominal or pelvic lining (peritoneal cancer).  Prostate.  Pancreatic. Cervical, Uterine, and Ovarian Cancer Your health care provider may recommend that you be screened regularly for cancer of the pelvic organs. These include your ovaries, uterus, and vagina. This screening involves a pelvic exam, which includes checking for microscopic changes to the surface of your cervix (Pap test).  For women ages 21-65,  health care providers may recommend a pelvic exam and a Pap test every three years. For women ages 6-65, they may recommend the Pap test and pelvic exam, combined with testing for human papilloma virus (HPV), every five years. Some types of HPV increase your risk of cervical cancer. Testing for HPV may also be done on women of any age who have unclear Pap test results.  Other health care providers may not recommend any screening for nonpregnant women who are considered low risk for pelvic cancer and have no symptoms. Ask your health care provider if a screening pelvic exam is right for you.  If you have had past treatment for cervical cancer or a condition that could lead to cancer, you need Pap tests and screening for cancer for at least 20 years after your treatment. If Pap tests have been discontinued for you, your risk factors (such as having a new sexual partner) need to be reassessed to determine if you should start having screenings again. Some women have medical problems that increase the chance of getting cervical cancer. In these cases, your health care provider may recommend that you have screening and Pap tests more often.  If you have a family history  of uterine cancer or ovarian cancer, talk with your health care provider about genetic screening.  If you have vaginal bleeding after reaching menopause, tell your health care provider.  There are currently no reliable tests available to screen for ovarian cancer. Lung Cancer Lung cancer screening is recommended for adults 82-78 years old who are at high risk for lung cancer because of a history of smoking. A yearly low-dose CT scan of the lungs is recommended if you:  Currently smoke.  Have a history of at least 30 pack-years of smoking and you currently smoke or have quit within the past 15 years. A pack-year is smoking an average of one pack of cigarettes per day for one year. Yearly screening should:  Continue until it has been 15  years since you quit.  Stop if you develop a health problem that would prevent you from having lung cancer treatment. Colorectal Cancer  This type of cancer can be detected and can often be prevented.  Routine colorectal cancer screening usually begins at age 69 and continues through age 68.  If you have risk factors for colon cancer, your health care provider may recommend that you be screened at an earlier age.  If you have a family history of colorectal cancer, talk with your health care provider about genetic screening.  Your health care provider may also recommend using home test kits to check for hidden blood in your stool.  A small camera at the end of a tube can be used to examine your colon directly (sigmoidoscopy or colonoscopy). This is done to check for the earliest forms of colorectal cancer.  Direct examination of the colon should be repeated every 5-10 years until age 14. However, if early forms of precancerous polyps or small growths are found or if you have a family history or genetic risk for colorectal cancer, you may need to be screened more often. Skin Cancer  Check your skin from head to toe regularly.  Monitor any moles. Be sure to tell your health care provider:  About any new moles or changes in moles, especially if there is a change in a mole's shape or color.  If you have a mole that is larger than the size of a pencil eraser.  If any of your family members has a history of skin cancer, especially at a young age, talk with your health care provider about genetic screening.  Always use sunscreen. Apply sunscreen liberally and repeatedly throughout the day.  Whenever you are outside, protect yourself by wearing long sleeves, pants, a wide-brimmed hat, and sunglasses. WHAT SHOULD I KNOW ABOUT OSTEOPOROSIS? Osteoporosis is a condition in which bone destruction happens more quickly than new bone creation. After menopause, you may be at an increased risk for  osteoporosis. To help prevent osteoporosis or the bone fractures that can happen because of osteoporosis, the following is recommended:  If you are 18-67 years old, get at least 1,000 mg of calcium and at least 600 mg of vitamin D per day.  If you are older than age 31 but younger than age 11, get at least 1,200 mg of calcium and at least 600 mg of vitamin D per day.  If you are older than age 64, get at least 1,200 mg of calcium and at least 800 mg of vitamin D per day. Smoking and excessive alcohol intake increase the risk of osteoporosis. Eat foods that are rich in calcium and vitamin D, and do weight-bearing exercises several times each  week as directed by your health care provider. WHAT SHOULD I KNOW ABOUT HOW MENOPAUSE AFFECTS [place]? Depression may occur at any age, but it is more common as you become older. Common symptoms of depression include:  Low or sad mood.  Changes in sleep patterns.  Changes in appetite or eating patterns.  Feeling an overall lack of motivation or enjoyment of activities that you previously enjoyed.  Frequent crying spells. Talk with your health care provider if you think that you are experiencing depression. WHAT SHOULD I KNOW ABOUT IMMUNIZATIONS? It is important that you get and maintain your immunizations. These include:  Tetanus, diphtheria, and pertussis (Tdap) booster vaccine.  Influenza every year before the flu season begins.  Pneumonia vaccine.  Shingles vaccine. Your health care provider may also recommend other immunizations.   This information is not intended to replace advice given to you by your health care provider. Make sure you discuss any questions you have with your health care provider.   Document Released: 03/26/2005 Document Revised: 02/22/2014 Document Reviewed: 10/04/2013 Elsevier Interactive Patient Education [employer].

## 2015-01-06 NOTE — Progress Notes (Signed)
Patient ID: Rita Taylor, female    DOB: 30-May-1940  Age: 74 y.o. MRN: [id number]  The patient is here for annual physical  examination and management of other chronic and acute problems.   dexa  Oct 2015  4% drop TR score -2.1  repeat due oct 2017 Pap 2014 COLON NORMAL 2015 MAMMOGRAM ORDERED IN SEPT 2015  BUT NOT DONE  ,   IMPLANTS [place]  [facility]  [place] annually  The risk factors are reflected in the social history.  The roster of all physicians providing medical care to patient - is listed in the Snapshot section of the chart.  Activities of daily living:  The patient is 100% independent in all ADLs: dressing, toileting, feeding as well as independent mobility  Home safety : The patient has smoke detectors in the home. They wear seatbelts.  There are no firearms at home. There is no violence in the home.   There is no risks for hepatitis, STDs or HIV. There is no   history of blood transfusion. They have no travel history to infectious disease endemic areas of the world.  The patient has seen their dentist in the last six month. They have seen their eye doctor in the last year. They admit to slight hearing difficulty with regard to whispered voices and some television programs.  They have deferred audiologic testing in the last year.  They do not  have excessive sun exposure. Discussed the need for sun protection: hats, long sleeves and use of sunscreen if there is significant sun exposure.   Diet: the importance of a healthy diet is discussed. They do have a healthy diet.  The benefits of regular aerobic exercise were discussed. She walks 4 times per week ,  20 minutes.   Depression screen: there are no signs or vegative symptoms of depression- irritability, change in appetite, anhedonia, sadness/tearfullness.  Cognitive assessment: the patient manages all their financial and personal affairs and is actively engaged. They could relate day,date,year and events; recalled 2/3 objects  at 3 minutes; performed clock-face test normally.  The following portions of the patient's history were reviewed and updated as appropriate: allergies, current medications, past family history, past medical history,  past surgical history, past social history  and problem list.  Visual acuity was not assessed per patient preference since she has regular follow up with her ophthalmologist. Hearing and body mass index were assessed and reviewed.   During the course of the visit the patient was educated and counseled about appropriate screening and preventive services including : fall prevention , diabetes screening, nutrition counseling, colorectal cancer screening, and recommended immunizations.    CC: The primary encounter diagnosis was Loss of weight. Diagnoses of Need for hepatitis C screening test, Vitamin D deficiency, Essential hypertension, Breast cancer screening, Sacroiliitis ([place]), and Routine general medical examination at a health care facility were also pertinent to this visit.   LEFT HIP pain  Improved with prednisone taper and tramadol but eh pain recurred after prednisone wore  off.   The pain is aggravated by rest and sitting and improves with activity.  Had an episode of severe 10/10 pain ,  But still  Improved after 30 minutes .  Saw Ortho Dr Sabra Heck who gave her an MR and meloxicam initially but gave her an injection which helped ,  But it took 4 weeks before it finally resolved. Prior to complete resolution it did lateralize to thigh and knee.  D/w sacroileitis .  History Rita Taylor has a past medical history of Hypertension and Melanoma ([place]) (2008).   She has past surgical history that includes Gynecologic cryosurgery; Breast enhancement surgery (1990); and bitubal ligation (1981).   Her family history includes Hypertension in her brother, father, and mother.She reports that she has never smoked. She has never used smokeless tobacco. She reports that she drinks alcohol. She  reports that she does not use illicit drugs.  Outpatient Prescriptions Prior to Visit  Medication Sig Dispense Refill  . aspirin 81 MG tablet Take 81 mg by mouth daily.      Marland Kitchen losartan (COZAAR) 100 MG tablet take 1 tablet by mouth once daily 90 tablet 1  . triamterene-hydrochlorothiazide (MAXZIDE-25) 37.5-25 MG tablet take 1 tablet by mouth once daily 30 tablet 5  . Calcium Carbonate-Vit D-Min (CALCIUM 1200) 1200-1000 MG-UNIT CHEW Chew 1 tablet by mouth daily.      . predniSONE (DELTASONE) 10 MG tablet 6 tablets on Day 1 , then reduce by 1 tablet daily until gone (Patient not taking: Reported on 01/06/2015) 21 tablet 0  . traMADol (ULTRAM) 50 MG tablet Take 1 tablet (50 mg total) by mouth every 8 (eight) hours as needed. (Patient not taking: Reported on 01/06/2015) 60 tablet 2   No facility-administered medications prior to visit.    Review of Systems   Patient denies headache, fevers, malaise, unintentional weight loss, skin rash, eye pain, sinus congestion and sinus pain, sore throat, dysphagia,  hemoptysis , cough, dyspnea, wheezing, chest pain, palpitations, orthopnea, edema, abdominal pain, nausea, melena, diarrhea, constipation, flank pain, dysuria, hematuria, urinary  Frequency, nocturia, numbness, tingling, seizures,  Focal weakness, Loss of consciousness,  Tremor, insomnia, depression, anxiety, and suicidal ideation.      Objective:  BP 136/78 mmHg  Pulse 79  Temp(Src) 98.6 F (37 C) (Oral)  Resp 18  Wt 114 lb (51.71 kg)  SpO2 99%  Physical Exam   General appearance: alert, cooperative and appears stated age Head: Normocephalic, without obvious abnormality, atraumatic Eyes: conjunctivae/corneas clear. PERRL, EOM's intact. Fundi benign. Ears: normal TM's and external ear canals both ears Nose: Nares normal. Septum midline. Mucosa normal. No drainage or sinus tenderness. Throat: lips, mucosa, and tongue normal; teeth and gums normal Neck: no adenopathy, no carotid bruit,  no JVD, supple, symmetrical, trachea midline and thyroid not enlarged, symmetric, no tenderness/mass/nodules Lungs: clear to auscultation bilaterally Breasts:  Silicone implants , normal appearance, no masses or tenderness Heart: regular rate and rhythm, S1, S2 normal, no murmur, click, rub or gallop Abdomen: soft, non-tender; bowel sounds normal; no masses,  no organomegaly Extremities: extremities normal, atraumatic, no cyanosis or edema Pulses: 2+ and symmetric Skin: Skin color, texture, turgor normal. No rashes or lesions Neurologic: Alert and oriented X 3, normal strength and tone. Normal symmetric reflexes. Normal coordination and gait.     Assessment & Plan:   Problem List Items Addressed This Visit    Hypertension    Secondary to white coat hypertension. Per 24 ambulatory monitor      Relevant Orders   Comprehensive metabolic panel (Completed)   LDL cholesterol, direct (Completed)   Lipid panel (Completed)   Loss of weight - Primary    Checking for thyroid and diabetes.  I have reviewed her diet and recommended that she increase her protein and fat intake while monitoring her carbohydrates.       Relevant Orders   TSH (Completed)   Sacroiliitis (HCC)    Left hip, now resolved.  Not currently on any  NSAIDs,       Relevant Medications   cyclobenzaprine (FLEXERIL) 10 MG tablet   meloxicam (MOBIC) 15 MG tablet   Routine general medical examination at a health care facility    Annualcomprehensive physical exam was done.  During the course of the visit the patient was educated and counseled about appropriate screening and preventive services including :  diabetes screening, lipid analysis with projected  10 year  risk for CAD , nutrition counseling, colorectal cancer screening, and recommended immunizations.  Printed recommendations for health maintenance screenings was given.        Other Visit Diagnoses    Need for hepatitis C screening test        Relevant Orders     Hepatitis C antibody    Vitamin D deficiency        Relevant Orders    VITAMIN D 25 Hydroxy (Vit-D Deficiency, Fractures) (Completed)    Breast cancer screening        Relevant Orders    MM DIGITAL SCREENING BILATERAL      A total of 40 minutes was spent with patient more than half of which was spent in counseling patient on the above mentioned issues , reviewing and explaining recent labs and imaging studies done, and coordination of care.  I have discontinued Ms. Hornbrook's predniSONE and traMADol. I am also having her maintain her aspirin, CALCIUM 1200, losartan, triamterene-hydrochlorothiazide, cyclobenzaprine, and meloxicam.  Meds ordered this encounter  Medications  . cyclobenzaprine (FLEXERIL) 10 MG tablet    Sig: Take 10 mg by mouth at bedtime.  . meloxicam (MOBIC) 15 MG tablet    Sig: Take 15 mg by mouth daily.    Medications Discontinued During This Encounter  Medication Reason  . predniSONE (DELTASONE) 10 MG tablet   . traMADol (ULTRAM) 50 MG tablet     Follow-up: Return in about 6 months (around 07/06/2015).   Crecencio Mc, MD

## 2015-01-07 ENCOUNTER — Ambulatory Visit: Payer: Medicare Other | Admitting: Internal Medicine

## 2015-01-07 LAB — HEPATITIS C ANTIBODY: HCV AB: NEGATIVE

## 2015-01-08 ENCOUNTER — Telehealth: Payer: Self-pay | Admitting: Internal Medicine

## 2015-01-08 DIAGNOSIS — M533 Sacrococcygeal disorders, not elsewhere classified: Secondary | ICD-10-CM | POA: Insufficient documentation

## 2015-01-08 DIAGNOSIS — M7072 Other bursitis of hip, left hip: Secondary | ICD-10-CM

## 2015-01-08 DIAGNOSIS — M5136 Other intervertebral disc degeneration, lumbar region: Secondary | ICD-10-CM | POA: Diagnosis not present

## 2015-01-08 NOTE — Telephone Encounter (Signed)
Pt called about the pain in her left hip pain has come back. Pt wants to know if she can be sent to the sports medication doctor? Thank You!

## 2015-01-08 NOTE — Telephone Encounter (Signed)
Please advise referral?  

## 2015-01-13 NOTE — Telephone Encounter (Signed)
Referral is in process as requested 

## 2015-01-14 MED ORDER — POTASSIUM CHLORIDE CRYS ER 20 MEQ PO TBCR: 20.0000 meq | EXTENDED_RELEASE_TABLET | Freq: Every day | ORAL | Status: DC

## 2015-01-14 NOTE — Addendum Note (Signed)
Addended by: Crecencio Mc on: 01/14/2015 09:41 PM   Modules accepted: Orders

## 2015-01-16 ENCOUNTER — Ambulatory Visit: Payer: Medicare Other | Admitting: Internal Medicine

## 2015-01-23 ENCOUNTER — Ambulatory Visit (INDEPENDENT_AMBULATORY_CARE_PROVIDER_SITE_OTHER): Payer: Medicare Other

## 2015-01-23 VITALS — BP 120/70 | HR 81 | Temp 97.4°F | Resp 12 | Ht 63.0 in | Wt 112.4 lb

## 2015-01-23 DIAGNOSIS — Z0000 Encounter for general adult medical examination without abnormal findings: Secondary | ICD-10-CM

## 2015-01-23 NOTE — Progress Notes (Signed)
Subjective:   Rita Taylor is a 74 y.o. female who presents for Medicare Annual (Subsequent) preventive examination.  Review of Systems:  No ROS.  Medicare Wellness Visit. Cardiac Risk Factors include: hypertension;advanced age (>33men, >41 women)     Objective:     Vitals: BP 120/70 mmHg  Pulse 81  Temp(Src) 97.4 F (36.3 C) (Oral)  Resp 12  Ht 5\' 3"  (1.6 m)  Wt 112 lb 6.4 oz (50.984 kg)  BMI 19.92 kg/m2  SpO2 95%  Tobacco History  Smoking status  . Never Smoker   Smokeless tobacco  . Never Used     Counseling given: Not Answered   Past Medical History  Diagnosis Date  . Hypertension   . Melanoma ([place]) 2008    removed by Koleen Nimrod, Wider excision by Tamala Julian left flank   Past Surgical History  Procedure Laterality Date  . Gynecologic cryosurgery      15 years ago, normal since then, was treated with antibiotics, Annual pap smears for the past 20 years  . Breast enhancement surgery  1990  . Bitubal ligation  1981   Family History  Problem Relation Age of Onset  . Hypertension Mother   . Hypertension Father   . Hypertension Brother    History  Sexual Activity  . Sexual Activity: Yes    Outpatient Encounter Prescriptions as of 01/23/2015  Medication Sig  . aspirin 81 MG tablet Take 81 mg by mouth daily.    . Calcium Carbonate-Vit D-Min (CALCIUM 1200) 1200-1000 MG-UNIT CHEW Chew 1 tablet by mouth daily.    . cyclobenzaprine (FLEXERIL) 10 MG tablet Take 10 mg by mouth at bedtime.  Marland Kitchen losartan (COZAAR) 100 MG tablet take 1 tablet by mouth once daily  . meloxicam (MOBIC) 15 MG tablet Take 15 mg by mouth daily.  . potassium chloride SA (K-DUR,KLOR-CON) 20 MEQ tablet Take 1 tablet (20 mEq total) by mouth daily.  Marland Kitchen triamterene-hydrochlorothiazide (MAXZIDE-25) 37.5-25 MG tablet take 1 tablet by mouth once daily   No facility-administered encounter medications on file as of 01/23/2015.    Activities of Daily Living In your present state of health, do you  have any difficulty performing the following activities: 01/23/2015  Hearing? Y  Vision? N  Difficulty concentrating or making decisions? N  Walking or climbing stairs? N  Dressing or bathing? N  Doing errands, shopping? N  Preparing Food and eating ? N  Using the Toilet? N  In the past six months, have you accidently leaked urine? N  Do you have problems with loss of bowel control? N  Managing your Medications? N  Managing your Finances? N  Housekeeping or managing your Housekeeping? N    Patient Care Team: Crecencio Mc, MD as PCP - General (Internal Medicine)    Assessment:    This is a routine wellness examination for Rapelje. The goal of the wellness visit is to assist the patient how to close the gaps in care and create a preventative care plan for the patient.   Bone Density/Risk for Osteoporosis discussed/reviewed; taking Calcium as prescribed.  Taking meds without issues; no barriers identified.  Safety issues reviewed; smoke detectors in the home. No firearms in the home. Wears seatbelts when driving or riding with others. No violence in the home.  The patient was oriented x 3; appropriate in dress and manner and no objective failures at ADL's or IADL's.   Patient Concerns: None at this time. Follow up with PCP as needed.  Exercise Activities and Dietary recommendations Current Exercise Habits:: Home exercise routine, Type of exercise: walking, Time (Minutes): 60, Frequency (Times/Week): > 6, Weekly Exercise (Minutes/Week): 0, Intensity: Intense  Goals    . maintain healthy lifestyle     Continue exercise regiment,  maintain healthy diet, and continue drinking plenty of water      Fall Risk Fall Risk  01/23/2015 01/06/2015 11/12/2013 11/08/2012 01/26/2012  Falls in the past year? No No No No No   Depression Screen PHQ 2/9 Scores 01/23/2015 01/06/2015 11/12/2013 11/08/2012  PHQ - 2 Score 0 0 0 0     Cognitive Testing MMSE - Mini Mental State Exam  01/23/2015  Orientation to time 5  Orientation to Place 5  Registration 3  Attention/ Calculation 5  Recall 3  Language- name 2 objects 2  Language- repeat 1  Language- follow 3 step command 3  Language- read & follow direction 1  Write a sentence 1  Copy design 1  Total score 30    Immunization History  Administered Date(s) Administered  . Influenza Split 11/15/2011  . Influenza,inj,Quad PF,36+ Mos 11/08/2012, 11/12/2013  . Influenza-Unspecified 11/06/2014  . Pneumococcal Conjugate-13 11/12/2013  . Pneumococcal Polysaccharide-23 11/08/2012  . Tdap 10/13/2011  . Zoster 07/03/2008   Screening Tests Health Maintenance  Topic Date Due  . INFLUENZA VACCINE  09/16/2015  . TETANUS/TDAP  10/12/2021  . COLONOSCOPY  03/15/2023  . DEXA SCAN  Completed  . ZOSTAVAX  Completed  . PNA vac Low Risk Adult  Completed      Plan:    End of life planning; Advance aging was discussed. Education provided to help continue the conversation with her family. She plans to return a copy of HCPOA/Living form at follow up visit. Time spent discussing [place] of Attorney/Living Will short form is 20 minutes.  Return in May for scheduled follow up.  During the course of the visit the patient was educated and counseled about the following appropriate screening and preventive services:   Vaccines to include Pneumoccal, Influenza, Hepatitis B, Td, Zostavax, HCV  Electrocardiogram  Cardiovascular Disease  Colorectal cancer screening  Bone density screening  Diabetes screening  Glaucoma screening  Mammography/PAP  Nutrition counseling   Patient Instructions (the written plan) was given to the patient.   Varney Biles, LPN  [id number]

## 2015-01-23 NOTE — Patient Instructions (Addendum)
Rita Taylor,  Thank you for taking time to come for your Medicare Wellness Visit.  I appreciate your ongoing commitment to your health goals. Please review the following plan we discussed and let me know if I can assist you in the future.  Follow up with PCP as needed.  Return in May for scheduled appointment.  Happy Holidays!     Health Maintenance, Female Adopting a healthy lifestyle and getting preventive care can go a long way to promote health and wellness. Talk with your health care provider about what schedule of regular examinations is right for you. This is a good chance for you to check in with your provider about disease prevention and staying healthy. In between checkups, there are plenty of things you can do on your own. Experts have done a lot of research about which lifestyle changes and preventive measures are most likely to keep you healthy. Ask your health care provider for more information. WEIGHT AND DIET  Eat a healthy diet  Be sure to include plenty of vegetables, fruits, low-fat dairy products, and lean protein.  Do not eat a lot of foods high in solid fats, added sugars, or salt.  Get regular exercise. This is one of the most important things you can do for your health.  Most adults should exercise for at least 150 minutes each week. The exercise should increase your heart rate and make you sweat (moderate-intensity exercise).  Most adults should also do strengthening exercises at least twice a week. This is in addition to the moderate-intensity exercise.  Maintain a healthy weight  Body mass index (BMI) is a measurement that can be used to identify possible weight problems. It estimates body fat based on height and weight. Your health care provider can help determine your BMI and help you achieve or maintain a healthy weight.  For females 66 years of age and older:   A BMI below 18.5 is considered underweight.  A BMI of 18.5 to 24.9 is normal.  A BMI  of 25 to 29.9 is considered overweight.  A BMI of 30 and above is considered obese.  Watch levels of cholesterol and blood lipids  You should start having your blood tested for lipids and cholesterol at 75 years of age, then have this test every 5 years.  You may need to have your cholesterol levels checked more often if:  Your lipid or cholesterol levels are high.  You are older than 75 years of age.  You are at high risk for heart disease.  CANCER SCREENING   Lung Cancer  Lung cancer screening is recommended for adults 86-80 years old who are at high risk for lung cancer because of a history of smoking.  A yearly low-dose CT scan of the lungs is recommended for people who:  Currently smoke.  Have quit within the past 15 years.  Have at least a 30-pack-year history of smoking. A pack year is smoking an average of one pack of cigarettes a day for 1 year.  Yearly screening should continue until it has been 15 years since you quit.  Yearly screening should stop if you develop a health problem that would prevent you from having lung cancer treatment.  Breast Cancer  Practice breast self-awareness. This means understanding how your breasts normally appear and feel.  It also means doing regular breast self-exams. Let your health care provider know about any changes, no matter how small.  If you are in your 20s or 30s, you  should have a clinical breast exam (CBE) by a health care provider every 1-3 years as part of a regular health exam.  If you are 48 or older, have a CBE every year. Also consider having a breast X-ray (mammogram) every year.  If you have a family history of breast cancer, talk to your health care provider about genetic screening.  If you are at high risk for breast cancer, talk to your health care provider about having an MRI and a mammogram every year.  Breast cancer gene (BRCA) assessment is recommended for women who have family members with  BRCA-related cancers. BRCA-related cancers include:  Breast.  Ovarian.  Tubal.  Peritoneal cancers.  Results of the assessment will determine the need for genetic counseling and BRCA1 and BRCA2 testing. Cervical Cancer Your health care provider may recommend that you be screened regularly for cancer of the pelvic organs (ovaries, uterus, and vagina). This screening involves a pelvic examination, including checking for microscopic changes to the surface of your cervix (Pap test). You may be encouraged to have this screening done every 3 years, beginning at age 92.  For women ages 36-65, health care providers may recommend pelvic exams and Pap testing every 3 years, or they may recommend the Pap and pelvic exam, combined with testing for human papilloma virus (HPV), every 5 years. Some types of HPV increase your risk of cervical cancer. Testing for HPV may also be done on women of any age with unclear Pap test results.  Other health care providers may not recommend any screening for nonpregnant women who are considered low risk for pelvic cancer and who do not have symptoms. Ask your health care provider if a screening pelvic exam is right for you.  If you have had past treatment for cervical cancer or a condition that could lead to cancer, you need Pap tests and screening for cancer for at least 20 years after your treatment. If Pap tests have been discontinued, your risk factors (such as having a new sexual partner) need to be reassessed to determine if screening should resume. Some women have medical problems that increase the chance of getting cervical cancer. In these cases, your health care provider may recommend more frequent screening and Pap tests. Colorectal Cancer  This type of cancer can be detected and often prevented.  Routine colorectal cancer screening usually begins at 75 years of age and continues through 75 years of age.  Your health care provider may recommend screening at  an earlier age if you have risk factors for colon cancer.  Your health care provider may also recommend using home test kits to check for hidden blood in the stool.  A small camera at the end of a tube can be used to examine your colon directly (sigmoidoscopy or colonoscopy). This is done to check for the earliest forms of colorectal cancer.  Routine screening usually begins at age 21.  Direct examination of the colon should be repeated every 5-10 years through 75 years of age. However, you may need to be screened more often if early forms of precancerous polyps or small growths are found. Skin Cancer  Check your skin from head to toe regularly.  Tell your health care provider about any new moles or changes in moles, especially if there is a change in a mole's shape or color.  Also tell your health care provider if you have a mole that is larger than the size of a pencil eraser.  Always use sunscreen.  Apply sunscreen liberally and repeatedly throughout the day.  Protect yourself by wearing long sleeves, pants, a wide-brimmed hat, and sunglasses whenever you are outside. HEART DISEASE, DIABETES, AND HIGH BLOOD PRESSURE   High blood pressure causes heart disease and increases the risk of stroke. High blood pressure is more likely to develop in:  People who have blood pressure in the high end of the normal range (130-139/85-89 mm Hg).  People who are overweight or obese.  People who are African American.  If you are 6-1 years of age, have your blood pressure checked every 3-5 years. If you are 57 years of age or older, have your blood pressure checked every year. You should have your blood pressure measured twice--once when you are at a hospital or clinic, and once when you are not at a hospital or clinic. Record the average of the two measurements. To check your blood pressure when you are not at a hospital or clinic, you can use:  An automated blood pressure machine at a  pharmacy.  A home blood pressure monitor.  If you are between 68 years and 38 years old, ask your health care provider if you should take aspirin to prevent strokes.  Have regular diabetes screenings. This involves taking a blood sample to check your fasting blood sugar level.  If you are at a normal weight and have a low risk for diabetes, have this test once every three years after 75 years of age.  If you are overweight and have a high risk for diabetes, consider being tested at a younger age or more often. PREVENTING INFECTION  Hepatitis B  If you have a higher risk for hepatitis B, you should be screened for this virus. You are considered at high risk for hepatitis B if:  You were born in a country where hepatitis B is common. Ask your health care provider which countries are considered high risk.  Your parents were born in a high-risk country, and you have not been immunized against hepatitis B (hepatitis B vaccine).  You have HIV or AIDS.  You use needles to inject street drugs.  You live with someone who has hepatitis B.  You have had sex with someone who has hepatitis B.  You get hemodialysis treatment.  You take certain medicines for conditions, including cancer, organ transplantation, and autoimmune conditions. Hepatitis C  Blood testing is recommended for:  Everyone born from [birth year] through 1966.  Anyone with known risk factors for hepatitis C. Sexually transmitted infections (STIs)  You should be screened for sexually transmitted infections (STIs) including gonorrhea and chlamydia if:  You are sexually active and are younger than 75 years of age.  You are older than 75 years of age and your health care provider tells you that you are at risk for this type of infection.  Your sexual activity has changed since you were last screened and you are at an increased risk for chlamydia or gonorrhea. Ask your health care provider if you are at risk.  If you do not  have HIV, but are at risk, it may be recommended that you take a prescription medicine daily to prevent HIV infection. This is called pre-exposure prophylaxis (PrEP). You are considered at risk if:  You are sexually active and do not regularly use condoms or know the HIV status of your partner(s).  You take drugs by injection.  You are sexually active with a partner who has HIV. Talk with your health care provider about  whether you are at high risk of being infected with HIV. If you choose to begin PrEP, you should first be tested for HIV. You should then be tested every 3 months for as long as you are taking PrEP.  PREGNANCY   If you are premenopausal and you may become pregnant, ask your health care provider about preconception counseling.  If you may become pregnant, take 400 to 800 micrograms (mcg) of folic acid every day.  If you want to prevent pregnancy, talk to your health care provider about birth control (contraception). OSTEOPOROSIS AND MENOPAUSE   Osteoporosis is a disease in which the bones lose minerals and strength with aging. This can result in serious bone fractures. Your risk for osteoporosis can be identified using a bone density scan.  If you are 38 years of age or older, or if you are at risk for osteoporosis and fractures, ask your health care provider if you should be screened.  Ask your health care provider whether you should take a calcium or vitamin D supplement to lower your risk for osteoporosis.  Menopause may have certain physical symptoms and risks.  Hormone replacement therapy may reduce some of these symptoms and risks. Talk to your health care provider about whether hormone replacement therapy is right for you.  HOME CARE INSTRUCTIONS   Schedule regular health, dental, and eye exams.  Stay current with your immunizations.   Do not use any tobacco products including cigarettes, chewing tobacco, or electronic cigarettes.  If you are pregnant, do not  drink alcohol.  If you are breastfeeding, limit how much and how often you drink alcohol.  Limit alcohol intake to no more than 1 drink per day for nonpregnant women. One drink equals 12 ounces of beer, 5 ounces of wine, or 1 ounces of hard liquor.  Do not use street drugs.  Do not share needles.  Ask your health care provider for help if you need support or information about quitting drugs.  Tell your health care provider if you often feel depressed.  Tell your health care provider if you have ever been abused or do not feel safe at home.   This information is not intended to replace advice given to you by your health care provider. Make sure you discuss any questions you have with your health care provider.   Document Released: 08/17/2010 Document Revised: 02/22/2014 Document Reviewed: 01/03/2013 Elsevier Interactive Patient Education [employer].

## 2015-01-23 NOTE — Progress Notes (Signed)
  I have reviewed the above information and agree with above.   Teresa Tullo, MD 

## 2015-01-27 ENCOUNTER — Encounter: Payer: Self-pay | Admitting: Family Medicine

## 2015-01-27 ENCOUNTER — Ambulatory Visit (INDEPENDENT_AMBULATORY_CARE_PROVIDER_SITE_OTHER): Payer: Medicare Other | Admitting: Family Medicine

## 2015-01-27 VITALS — BP 132/64 | HR 90 | Ht 63.0 in | Wt 112.0 lb

## 2015-01-27 DIAGNOSIS — G5703 Lesion of sciatic nerve, bilateral lower limbs: Secondary | ICD-10-CM | POA: Insufficient documentation

## 2015-01-27 DIAGNOSIS — G5702 Lesion of sciatic nerve, left lower limb: Secondary | ICD-10-CM | POA: Diagnosis not present

## 2015-01-27 NOTE — Progress Notes (Signed)
Pre visit review using our clinic review tool, if applicable. No additional management support is needed unless otherwise documented below in the visit note. 

## 2015-01-27 NOTE — Progress Notes (Signed)
[facility] [address], [place] [address] Phone: [phone] Subjective:    I'm seeing this patient by the request  of:  TULLO, TERESA L, MD   CC: left hip pain.    [id number] Rita Taylor is a 74 y.o. female coming in with complaint of left hip pain. Patient is had this pain intermittently for quite some time. Patient was seen by primary care provider who did give her prednisone. States that when she was on the prednisone the pain was completely resolved but then it seemed to come back. States that the pain seemed to be more on the lateral aspect of the hip. Seemed to migrate towards the posterior aspect of her hip. Patient did see another provider in orthopedic surgeon. 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 Was given an injection in the joint with no significant improvement. Patient then went back and unfortunately he changed his mind on what was giving him the diagnosis and he wanted to do another injection. She declined this and was sent here for second opinion. Patient denies any radiation down the leg, any numbness or tingling. Hurts more after sitting or laying down for quite a extended amount of time. States that it seems to get very tight and then improves with activity. Patient denies any back pain with it. No weakness or any bowel or bladder incontinence. Rates the severity pain proximally 8 out of 10.     Past Medical History  Diagnosis Date  . Hypertension   . Melanoma ([place]) 2008    removed by Koleen Nimrod, Wider excision by Tamala Julian left flank   Past Surgical History  Procedure Laterality Date  . Gynecologic cryosurgery      15 years ago, normal since then, was treated with antibiotics, Annual pap smears for the past 20 years  . Breast enhancement surgery  1990  . Bitubal ligation  1981   Social History  Substance Use Topics  . Smoking status: Never Smoker   . Smokeless tobacco: Never Used  . Alcohol Use: Yes       Comment: moderate half a glass daily   Allergies  Allergen Reactions  . Fosamax [Alendronate]     SWELLING  . Risedronate Sodium     hives   Family History  Problem Relation Age of Onset  . Hypertension Mother   . Hypertension Father   . Hypertension Brother      Past medical history, social, surgical and family history all reviewed in electronic medical record.   Review of Systems: No headache, visual changes, nausea, vomiting, diarrhea, constipation, dizziness, abdominal pain, skin rash, fevers, chills, night sweats, weight loss, swollen lymph nodes, body aches, joint swelling, muscle aches, chest pain, shortness of breath, mood changes.   Objective Blood pressure 132/64, pulse 90, height 5\' 3"  (1.6 m), weight 112 lb (50.803 kg), SpO2 98 %.  General: No apparent distress alert and oriented x3 mood and affect normal, dressed appropriately.  HEENT: Pupils equal, extraocular movements intact  Respiratory: Patient's speak in full sentences and does not appear short of breath  Cardiovascular: No lower extremity edema, non tender, no erythema  Skin: Warm dry intact with no signs of infection or rash on extremities or on axial skeleton.  Abdomen: Soft nontender  Neuro: Cranial nerves II through XII are intact, neurovascularly intact in all extremities with 2+ DTRs and 2+ pulses.  Lymph: No lymphadenopathy of posterior or anterior cervical chain or axillae bilaterally.  Gait  normal with good balance and coordination.  MSK:  Non tender with full range of motion and good stability and symmetric strength and tone of shoulders, elbows, wrist,  knee and ankles bilaterally.  Hip: Left ROM IR: 15 Deg, ER: 45 Deg, Flexion: 100 Deg, Extension: 80 Deg, Abduction: 45 Deg, Adduction: 15 Deg Strength IR: 5/5, ER: 5/5, Flexion: 5/5, Extension: 5/5, Abduction: 3+/5, Adduction: 5/5 Pelvic alignment unremarkable to inspection and palpation. Standing hip rotation and gait without trendelenburg  sign / unsteadiness. Severe tenderness over piriformis and greater trochanter. Contralateral hip unremarkable  No SI joint tenderness and normal minimal SI movement.  Procedure note [id number]; 15 minutes spent for Therapeutic exercises as stated in above notes.  This included exercises focusing on stretching, strengthening, with significant focus on eccentric aspects.  Piriformis Syndrome  Using an anatomical model, reviewed with the patient the structures involved and how they related to diagnosis. The patient indicated understanding.   The patient was given a handout from Dr. Arne Cleveland book "The Sports Medicine Patient Advisor" describing the anatomy and rehabilitation of the following condition: Piriformis Syndrome  Also given a handout with more extensive Piriformis stretching, hip flexor and abductor strengthening, ham stretching  Rec deep massage, explained self-massage with ball  Proper technique shown and discussed handout in great detail with ATC.  All questions were discussed and answered.     Impression and Recommendations:     This case required medical decision making of moderate complexity.

## 2015-01-27 NOTE — Assessment & Plan Note (Signed)
Piriformis Syndrome  Using an anatomical model, reviewed with the patient the structures involved and how they related to diagnosis. The patient indicated understanding.   The patient was given a handout from Dr. Arne Cleveland book "The Sports Medicine Patient Advisor" describing the anatomy and rehabilitation of the following condition: Piriformis Syndrome  Also given a handout with more extensive Piriformis stretching, hip flexor and abductor strengthening, ham stretching  Rec deep massage, explained self-massage with ball Patient will try to work on hip abductor strengthening. We discussed manual massage techniques. Work with Product/process development scientist to learn home exercises in greater detail. We discussed icing regimen. Patient will come back and see me again in 3-4 weeks. We discussed if continuing have pain we can consider an injection for diagnostic and therapeutic purposes.

## 2015-01-27 NOTE — Patient Instructions (Addendum)
Good to see you.  Ice 20 minutes 2 times daily. Usually after activity and before bed. Exercises 3 times a week.  Tennisball in back left pocket with driving or sitting a long amount of time.  Vitamin D once weekly for 8 weeks Continue the turmeric and the fish oil Go mattress shopping and sleep with a pillow between knees.  Capsicin topically over the counter can help as well.  Good shoes with rigid bottom.  Jalene Mullet, [place] or New balance greater then 700 See me again in 2-3 weeks if we need to do an injection Happy holidays!

## 2015-01-28 ENCOUNTER — Telehealth: Payer: Self-pay | Admitting: *Deleted

## 2015-01-28 NOTE — Telephone Encounter (Signed)
Patient questioned if it was time for her Prolia vaccine, if so please Advise.

## 2015-01-28 NOTE — Telephone Encounter (Signed)
I have electronically submitted pt's info for Prolia insurance verification and will notify you once I have a response. Thank you. °

## 2015-01-28 NOTE — Telephone Encounter (Signed)
Yes, she is due.  Rose, she is still approved for her second dose this year correct? Thanks

## 2015-01-29 ENCOUNTER — Other Ambulatory Visit: Payer: Self-pay | Admitting: *Deleted

## 2015-01-29 MED ORDER — ERGOCALCIFEROL 1.25 MG (50000 UT) PO CAPS: 50000.0000 [IU] | ORAL_CAPSULE | ORAL | Status: DC

## 2015-01-29 NOTE — Telephone Encounter (Signed)
Vitamin d 50000 IU sent into pharmacy.

## 2015-01-30 ENCOUNTER — Other Ambulatory Visit: Payer: Self-pay | Admitting: Internal Medicine

## 2015-02-07 ENCOUNTER — Telehealth: Payer: Self-pay | Admitting: *Deleted

## 2015-02-07 NOTE — Telephone Encounter (Signed)
Was sent to me erroneously

## 2015-02-07 NOTE — Telephone Encounter (Signed)
Sent to scan

## 2015-02-07 NOTE — Telephone Encounter (Signed)
FYI. Patient dropped off her Advanced Directive For [place]. Forms are in Dr. Lupita Dawn mailbox.

## 2015-02-07 NOTE — Telephone Encounter (Signed)
Please advise these documents Rita Taylor?

## 2015-02-12 ENCOUNTER — Encounter: Payer: Self-pay | Admitting: *Deleted

## 2015-02-18 ENCOUNTER — Ambulatory Visit: Payer: Medicare Other | Admitting: Family Medicine

## 2015-02-18 ENCOUNTER — Telehealth: Payer: Self-pay | Admitting: Internal Medicine

## 2015-02-18 NOTE — Telephone Encounter (Signed)
I have rec'd pt's insurance verification for Prolia and she has an estimated responsibility of $0.  Please make pt aware this is an estimate and we will not know and exact amt until insurance(s) has/have paid.  I have sent a copy of the summary of benefits to be scanned into pt's chart.  If you have any questions, please let me know.  Once pt rec's her injection please send me the actual injection date so I can update the Prolia portal.  Thank you.

## 2015-02-18 NOTE — Telephone Encounter (Signed)
Spoke with the patient, scheduled for appointment on Thursday.  Ordered today in Dimension 21 '

## 2015-02-18 NOTE — Telephone Encounter (Signed)
Doesn't need to be done until she has been on prolia for at least a year. Would wait until next September

## 2015-02-18 NOTE — Telephone Encounter (Signed)
Please advise 

## 2015-02-18 NOTE — Telephone Encounter (Signed)
Pt called about getting a Bone Dexa scan. Does Dr Derrel Nip want pt to get that done? Thank You!

## 2015-02-19 NOTE — Telephone Encounter (Signed)
Ok. Done. I called pt and informed her.

## 2015-02-19 NOTE — Telephone Encounter (Signed)
Please advise that we will check near September as she needs to be on the prolia for a year first.

## 2015-02-20 ENCOUNTER — Ambulatory Visit (INDEPENDENT_AMBULATORY_CARE_PROVIDER_SITE_OTHER): Payer: Medicare Other

## 2015-02-20 DIAGNOSIS — M810 Age-related osteoporosis without current pathological fracture: Secondary | ICD-10-CM | POA: Diagnosis not present

## 2015-02-20 MED ORDER — DENOSUMAB 60 MG/ML ~~LOC~~ SOLN
60.0000 mg | Freq: Once | SUBCUTANEOUS | Status: AC
  Administered 2015-02-20: 60 mg via SUBCUTANEOUS

## 2015-02-20 NOTE — Progress Notes (Signed)
Patient came in for Prolia injection.  Received in Left [place] arm.  Patient tolerated well.  Gave reminder card for next injection in 64months.   Please advise?

## 2015-02-20 NOTE — Telephone Encounter (Signed)
Received injection today

## 2015-03-03 ENCOUNTER — Other Ambulatory Visit: Payer: Self-pay | Admitting: Internal Medicine

## 2015-03-03 DIAGNOSIS — Z1231 Encounter for screening mammogram for malignant neoplasm of breast: Secondary | ICD-10-CM

## 2015-03-13 ENCOUNTER — Ambulatory Visit
Admission: RE | Admit: 2015-03-13 | Discharge: 2015-03-13 | Disposition: A | Discharge status: Home / Self Care | Payer: Medicare Other | Source: Ambulatory Visit | Attending: Internal Medicine | Admitting: Internal Medicine

## 2015-03-13 DIAGNOSIS — Z1231 Encounter for screening mammogram for malignant neoplasm of breast: Secondary | ICD-10-CM

## 2015-04-24 DIAGNOSIS — Z85820 Personal history of malignant melanoma of skin: Secondary | ICD-10-CM | POA: Diagnosis not present

## 2015-04-24 DIAGNOSIS — D2261 Melanocytic nevi of right upper limb, including shoulder: Secondary | ICD-10-CM | POA: Diagnosis not present

## 2015-04-24 DIAGNOSIS — D225 Melanocytic nevi of trunk: Secondary | ICD-10-CM | POA: Diagnosis not present

## 2015-07-02 ENCOUNTER — Telehealth: Payer: Self-pay | Admitting: Internal Medicine

## 2015-07-02 MED ORDER — TRIAMTERENE-HCTZ 37.5-25 MG PO TABS: 1.0000 | ORAL_TABLET | Freq: Every day | ORAL | Status: DC

## 2015-07-02 NOTE — Telephone Encounter (Signed)
Pt called needing a refill for medication triamterene-hydrochlorothiazide (MAXZIDE-25) 37.5-25 MG tablet. [facility] is [facility] [address] - [place], [place]. Call pt @ [phone]. Thank you!

## 2015-07-02 NOTE — Telephone Encounter (Signed)
Spoke to patient. Advised sent refill. Patient was grateful.

## 2015-07-07 ENCOUNTER — Ambulatory Visit (INDEPENDENT_AMBULATORY_CARE_PROVIDER_SITE_OTHER): Payer: Medicare Other | Admitting: Internal Medicine

## 2015-07-07 ENCOUNTER — Encounter: Payer: Self-pay | Admitting: Internal Medicine

## 2015-07-07 ENCOUNTER — Telehealth: Payer: Self-pay | Admitting: *Deleted

## 2015-07-07 VITALS — BP 150/70 | HR 59 | Temp 97.6°F | Resp 12 | Ht 63.0 in | Wt 112.5 lb

## 2015-07-07 DIAGNOSIS — G5702 Lesion of sciatic nerve, left lower limb: Secondary | ICD-10-CM | POA: Diagnosis not present

## 2015-07-07 DIAGNOSIS — I10 Essential (primary) hypertension: Secondary | ICD-10-CM

## 2015-07-07 DIAGNOSIS — M810 Age-related osteoporosis without current pathological fracture: Secondary | ICD-10-CM

## 2015-07-07 DIAGNOSIS — E785 Hyperlipidemia, unspecified: Secondary | ICD-10-CM

## 2015-07-07 DIAGNOSIS — E871 Hypo-osmolality and hyponatremia: Secondary | ICD-10-CM

## 2015-07-07 LAB — COMPREHENSIVE METABOLIC PANEL
ALT: 13 U/L (ref 0–35)
AST: 21 U/L (ref 0–37)
Albumin: 4.7 g/dL (ref 3.5–5.2)
Alkaline Phosphatase: 38 U/L — ABNORMAL LOW (ref 39–117)
BUN: 15 mg/dL (ref 6–23)
CALCIUM: 9.3 mg/dL (ref 8.4–10.5)
CHLORIDE: 94 meq/L — AB (ref 96–112)
CO2: 28 meq/L (ref 19–32)
Creatinine, Ser: 0.76 mg/dL (ref 0.40–1.20)
GFR: 78.71 mL/min (ref >60.00)
GLUCOSE: 92 mg/dL (ref 70–99)
Potassium: 4.1 mEq/L (ref 3.5–5.1)
Sodium: 129 mEq/L — ABNORMAL LOW (ref 135–145)
Total Bilirubin: 0.9 mg/dL (ref 0.2–1.2)
Total Protein: 6.8 g/dL (ref 6.0–8.3)

## 2015-07-07 LAB — LIPID PANEL
CHOL/HDL RATIO: 3
CHOLESTEROL: 250 mg/dL — AB (ref 0–200)
HDL: 79.5 mg/dL (ref >39.00)
LDL CALC: 155 mg/dL — AB (ref 0–99)
NonHDL: 170.75
TRIGLYCERIDES: 78 mg/dL (ref 0.0–149.0)
VLDL: 15.6 mg/dL (ref 0.0–40.0)

## 2015-07-07 NOTE — Progress Notes (Signed)
Pre-visit discussion using our clinic review tool. No additional management support is needed unless otherwise documented below in the visit note.  

## 2015-07-07 NOTE — Assessment & Plan Note (Signed)
Now on Prolia for the past year.  DEXA should be repeated this year. Next injection due July

## 2015-07-07 NOTE — Assessment & Plan Note (Signed)
Secondary to white coat hypertension. Per 24 ambulatory monitor and cardiology eval in 2014.  No changes unless home readings are > Q000111Q systolic consistently

## 2015-07-07 NOTE — Telephone Encounter (Signed)
Please advise and complete insurance verification. thanks

## 2015-07-07 NOTE — Telephone Encounter (Signed)
Patient Prolia vaccine is due in July.

## 2015-07-07 NOTE — Progress Notes (Addendum)
Subjective:  Patient ID: Rita Taylor, female    DOB: [birth year]  Age: 76 y.o. MRN: [id number]  CC: The primary encounter diagnosis was Osteoporosis. Diagnoses of Hyperlipidemia, Essential hypertension, Piriformis syndrome of left side, Osteoporosis, postmenopausal, and Hyponatremia were also pertinent to this visit.  HPI Rita Taylor presents for follow up on weight loss and hypertensaion. Weight was noted at CPE 6 months ago for unclear reasons.  Her weight has stabilized  . Appetite is normal, energy is normal and she has no complaints. today  Hip pain has resolved  With Zach Smith's intervention for pyriformis syndrome.  Using turrmeric, fish oil, pillow between the knees  , no  Injections.  Painful mammogram , doesn't want to have another one .  Tech thought it was because of her weight loss   PRolia given jan 2017 .  Last DEXA 2015  Outpatient Prescriptions Prior to Visit  Medication Sig Dispense Refill  . aspirin 81 MG tablet Take 81 mg by mouth daily.      . Calcium Carbonate-Vit D-Min (CALCIUM 1200) 1200-1000 MG-UNIT CHEW Chew 1 tablet by mouth daily.      Marland Kitchen losartan (COZAAR) 100 MG tablet take 1 tablet by mouth once daily 90 tablet 1  . triamterene-hydrochlorothiazide (MAXZIDE-25) 37.5-25 MG tablet Take 1 tablet by mouth daily. 30 tablet 5  . cyclobenzaprine (FLEXERIL) 10 MG tablet Take 10 mg by mouth at bedtime. Reported on 07/07/2015    . meloxicam (MOBIC) 15 MG tablet Take 15 mg by mouth daily. Reported on 07/07/2015    . ergocalciferol (VITAMIN D2) 50000 UNITS capsule Take 1 capsule (50,000 Units total) by mouth once a week. (Patient not taking: Reported on 07/07/2015) 8 capsule 0  . potassium chloride SA (K-DUR,KLOR-CON) 20 MEQ tablet Take 1 tablet (20 mEq total) by mouth daily. (Patient not taking: Reported on 07/07/2015) 30 tablet 3   No facility-administered medications prior to visit.    Review of Systems;  Patient denies headache, fevers, malaise, unintentional  weight loss, skin rash, eye pain, sinus congestion and sinus pain, sore throat, dysphagia,  hemoptysis , cough, dyspnea, wheezing, chest pain, palpitations, orthopnea, edema, abdominal pain, nausea, melena, diarrhea, constipation, flank pain, dysuria, hematuria, urinary  Frequency, nocturia, numbness, tingling, seizures,  Focal weakness, Loss of consciousness,  Tremor, insomnia, depression, anxiety, and suicidal ideation.      Objective:  BP 150/70 mmHg  Pulse 59  Temp(Src) 97.6 F (36.4 C) (Oral)  Resp 12  Ht 5\' 3"  (1.6 m)  Wt 112 lb 8 oz (51.03 kg)  BMI 19.93 kg/m2  SpO2 98%  BP Readings from Last 3 Encounters:  07/07/15 150/70  01/27/15 132/64  01/23/15 120/70    Wt Readings from Last 3 Encounters:  07/07/15 112 lb 8 oz (51.03 kg)  01/27/15 112 lb (50.803 kg)  01/23/15 112 lb 6.4 oz (50.984 kg)    General appearance: alert, cooperative and appears stated age Ears: normal TM's and external ear canals both ears Throat: lips, mucosa, and tongue normal; teeth and gums normal Neck: no adenopathy, no carotid bruit, supple, symmetrical, trachea midline and thyroid not enlarged, symmetric, no tenderness/mass/nodules Back: symmetric, no curvature. ROM normal. No CVA tenderness. Lungs: clear to auscultation bilaterally Heart: regular rate and rhythm, S1, S2 normal, no murmur, click, rub or gallop Abdomen: soft, non-tender; bowel sounds normal; no masses,  no organomegaly Pulses: 2+ and symmetric Skin: Skin color, texture, turgor normal. No rashes or lesions Lymph nodes: Cervical, supraclavicular, and axillary nodes normal.  No results found for: HGBA1C  Lab Results  Component Value Date   CREATININE 0.76 07/07/2015   CREATININE 0.76 01/06/2015   CREATININE 0.84 07/01/2014    Lab Results  Component Value Date   WBC 7.3 11/12/2013   HGB 12.8 11/12/2013   HCT 38.4 11/12/2013   PLT 239.0 11/12/2013   GLUCOSE 92 07/07/2015   CHOL 250* 07/07/2015   TRIG 78.0 07/07/2015    HDL 79.50 07/07/2015   LDLDIRECT 137.0 01/06/2015   LDLCALC 155* 07/07/2015   ALT 13 07/07/2015   AST 21 07/07/2015   NA 129* 07/07/2015   K 4.1 07/07/2015   CL 94* 07/07/2015   CREATININE 0.76 07/07/2015   BUN 15 07/07/2015   CO2 28 07/07/2015   TSH 0.89 01/06/2015   MICROALBUR 0.1 01/26/2012    Mm Digital Screening W/ Implants Bilateral  03/14/2015  CLINICAL DATA:  Screening. EXAM: DIGITAL SCREENING BILATERAL MAMMOGRAM WITH IMPLANTS AND CAD The patient has implants. Standard and implant displaced views were performed. COMPARISON:  Previous exam(s). ACR Breast Density Category c: The breast tissue is heterogeneously dense, which may obscure small masses. FINDINGS: There are no findings suspicious for malignancy. Images were processed with CAD. IMPRESSION: No mammographic evidence of malignancy. A result letter of this screening mammogram will be mailed directly to the patient. RECOMMENDATION: Screening mammogram in one year. (Code:SM-B-01Y) BI-RADS CATEGORY  1:  Negative. Electronically Signed   By: Abelardo Diesel M.D.   On: 03/14/2015 09:22    Assessment & Plan:   Problem List Items Addressed This Visit    Hyponatremia    Recurrent , secondary to maxizide.  Stopping it.  Starting bystolic 2.5 mg daily       Hyperlipidemia    Patient advised of  10 yr cardiac risk but is hesitant  to try statin.  RYR trial  preferred      Relevant Medications   nebivolol (BYSTOLIC) 2.5 MG tablet   Other Relevant Orders   Lipid panel (Completed)   Hypertension    Secondary to white coat hypertension. Per 24 ambulatory monitor and cardiology eval in 2014.  No changes unless home readings are > Q000111Q systolic consistently        Relevant Medications   nebivolol (BYSTOLIC) 2.5 MG tablet   Other Relevant Orders   Comprehensive metabolic panel (Completed)   Osteoporosis, postmenopausal    Now on Prolia for the past year.  DEXA should be repeated this year. Next injection due July        Piriformis syndrome of left side    Symptoms resolved.  continue current maintenance exercises and natural NSAIDs       Other Visit Diagnoses    Osteoporosis    -  Primary    Relevant Orders    DG Bone Density      A total of 25 minutes of face to face time was spent with patient more than half of which was spent in counselling about the above mentioned conditions  and coordination of care  I have discontinued Ms. Torrisi's potassium chloride SA, ergocalciferol, and triamterene-hydrochlorothiazide. I am also having her start on nebivolol and Red Yeast Rice. Additionally, I am having her maintain her aspirin, CALCIUM 1200, cyclobenzaprine, meloxicam, and losartan.  Meds ordered this encounter  Medications  . nebivolol (BYSTOLIC) 2.5 MG tablet    Sig: Take 1 tablet (2.5 mg total) by mouth daily.    Dispense:  30 tablet    Refill:  1  . Red Yeast  Rice 600 MG CAPS    Sig: Take 1 capsule (600 mg total) by mouth 2 (two) times daily.    Dispense:  60 capsule    Refill:  11    Medications Discontinued During This Encounter  Medication Reason  . potassium chloride SA (K-DUR,KLOR-CON) 20 MEQ tablet   . ergocalciferol (VITAMIN D2) 50000 UNITS capsule   . triamterene-hydrochlorothiazide (MAXZIDE-25) 37.5-25 MG tablet     Follow-up: No Follow-up on file.   Crecencio Mc, MD

## 2015-07-07 NOTE — Patient Instructions (Addendum)
I'm glad your hip is feeling better 1  Your weight has stabilized  You can take 1000 IUs  Of Vitamin D3 daily to keep your level up.  See you in 6 months for your annual exam!

## 2015-07-07 NOTE — Assessment & Plan Note (Signed)
Symptoms resolved.  continue current maintenance exercises and natural NSAIDs

## 2015-07-10 DIAGNOSIS — E785 Hyperlipidemia, unspecified: Secondary | ICD-10-CM | POA: Insufficient documentation

## 2015-07-10 MED ORDER — NEBIVOLOL HCL 2.5 MG PO TABS: 2.5000 mg | ORAL_TABLET | Freq: Every day | ORAL | Status: DC

## 2015-07-10 MED ORDER — RED YEAST RICE 600 MG PO CAPS: 1.0000 | ORAL_CAPSULE | Freq: Two times a day (BID) | ORAL | Status: DC

## 2015-07-10 NOTE — Addendum Note (Signed)
Addended by: Crecencio Mc on: 07/10/2015 02:39 PM   Modules accepted: Orders, SmartSet

## 2015-07-10 NOTE — Addendum Note (Signed)
Addended by: Crecencio Mc on: 07/10/2015 03:17 PM   Modules accepted: Orders, Medications, SmartSet

## 2015-07-10 NOTE — Assessment & Plan Note (Signed)
Patient advised of  10 yr cardiac risk but is hesitant  to try statin.  RYR trial  preferred

## 2015-07-10 NOTE — Assessment & Plan Note (Signed)
Recurrent , secondary to maxizide.  Stopping it.  Starting bystolic 2.5 mg daily

## 2015-07-11 ENCOUNTER — Telehealth: Payer: Self-pay | Admitting: Internal Medicine

## 2015-07-11 MED ORDER — AMLODIPINE BESYLATE 2.5 MG PO TABS: 2.5000 mg | ORAL_TABLET | Freq: Every day | ORAL | Status: DC

## 2015-07-11 NOTE — Telephone Encounter (Signed)
Spoke to patient. Informed. Patient verbalized understanding.

## 2015-07-11 NOTE — Telephone Encounter (Signed)
Received a PA for Bystolic, I am going to hold off completing due to below notes.  Please advise if it needs to be done. thanks

## 2015-07-11 NOTE — Telephone Encounter (Signed)
Ok,  i have called in amlodipine .  We have tried this before as well,  but we are very limited because she has so many intolerances.

## 2015-07-11 NOTE — Telephone Encounter (Signed)
Pt called about medication nebivolol (BYSTOLIC) 2.5 MG tablet pt has tried this medication before with unpleasant results and she wants to know if there is something else she can try?   [facility] is [facility] [address] - [place], [place]  Call pt @ [phone]. Thank you!

## 2015-07-17 ENCOUNTER — Other Ambulatory Visit: Payer: Medicare Other

## 2015-07-18 ENCOUNTER — Telehealth: Payer: Self-pay | Admitting: Internal Medicine

## 2015-07-18 ENCOUNTER — Encounter: Payer: Self-pay | Admitting: *Deleted

## 2015-07-18 NOTE — Telephone Encounter (Signed)
Pt called wanting to get a copy of her lab test on 07/07/15 and she would it to be mailed. Thank you!

## 2015-07-21 NOTE — Telephone Encounter (Signed)
Spoke with the patient, received prolia verification.  She is scheduled to come in on July 3rd for her injection, ordered in Dimension 21 today.  thanks

## 2015-07-21 NOTE — Telephone Encounter (Signed)
I have rec'd Rita Taylor's insurance verification for Prolia and her estimated responsibility is $0.  Please make pt aware this is an estimate and we will not know an exact amt until insurance(s) has/have paid.  I have sent a copy of the summary of benefits to be scanned into pt's chart.    Once pt recs injection, please let me know actual injection date so I can update the Prolia portal.  If you have any questions, please let me know.  Thank you!

## 2015-07-21 NOTE — Telephone Encounter (Signed)
I have electronically submitted pt's info for Prolia insurance verification and will notify you once I have a response. Thank you. °

## 2015-07-25 NOTE — Telephone Encounter (Signed)
Results mailed per patient request. thanks

## 2015-07-31 ENCOUNTER — Other Ambulatory Visit: Payer: Self-pay

## 2015-07-31 MED ORDER — LOSARTAN POTASSIUM 100 MG PO TABS: 100.0000 mg | ORAL_TABLET | Freq: Every day | ORAL | Status: DC

## 2015-08-11 ENCOUNTER — Telehealth: Payer: Self-pay

## 2015-08-11 DIAGNOSIS — I10 Essential (primary) hypertension: Secondary | ICD-10-CM

## 2015-08-11 NOTE — Telephone Encounter (Signed)
You are correct.  BMEt only ordered

## 2015-08-11 NOTE — Telephone Encounter (Signed)
Noted! Thank you

## 2015-08-11 NOTE — Telephone Encounter (Signed)
Pt coming for lab draw 08/12/15. From review of chart looks like we may be repeating a BMET, any other future orders needed? Thank you.

## 2015-08-12 ENCOUNTER — Other Ambulatory Visit (INDEPENDENT_AMBULATORY_CARE_PROVIDER_SITE_OTHER): Payer: Medicare Other

## 2015-08-12 ENCOUNTER — Encounter (INDEPENDENT_AMBULATORY_CARE_PROVIDER_SITE_OTHER): Payer: Self-pay

## 2015-08-12 ENCOUNTER — Ambulatory Visit: Payer: Medicare Other

## 2015-08-12 VITALS — BP 128/68 | HR 56 | Resp 18

## 2015-08-12 DIAGNOSIS — I10 Essential (primary) hypertension: Secondary | ICD-10-CM | POA: Diagnosis not present

## 2015-08-12 LAB — BASIC METABOLIC PANEL
BUN: 16 mg/dL (ref 6–23)
CALCIUM: 9.4 mg/dL (ref 8.4–10.5)
CO2: 32 mEq/L (ref 19–32)
Chloride: 100 mEq/L (ref 96–112)
Creatinine, Ser: 0.84 mg/dL (ref 0.40–1.20)
GFR: 70.11 mL/min (ref >60.00)
Glucose, Bld: 93 mg/dL (ref 70–99)
POTASSIUM: 4.1 meq/L (ref 3.5–5.1)
SODIUM: 138 meq/L (ref 135–145)

## 2015-08-12 NOTE — Progress Notes (Signed)
Care was provided under my supervision. BP well controlled. Management per primary.  [name] DO

## 2015-08-12 NOTE — Progress Notes (Signed)
Patient came in for BP Check, see vitals for details.  Patient has been checking BP at home, it has been in the 130-low 140's over 60's for the most part.  She wanted to let you know that years ago when she took Amlodopine she got very dizzy, that is not happening now, she is just tired.    Please advise. Thanks  Dr. Derrel Nip is out of the office. thanks

## 2015-08-18 ENCOUNTER — Ambulatory Visit (INDEPENDENT_AMBULATORY_CARE_PROVIDER_SITE_OTHER): Payer: Medicare Other

## 2015-08-18 DIAGNOSIS — M810 Age-related osteoporosis without current pathological fracture: Secondary | ICD-10-CM

## 2015-08-18 MED ORDER — DENOSUMAB 60 MG/ML ~~LOC~~ SOLN
60.0000 mg | Freq: Once | SUBCUTANEOUS | Status: AC
  Administered 2015-08-18: 60 mg via SUBCUTANEOUS

## 2015-08-18 NOTE — Progress Notes (Signed)
Patient came in for Prolia injection.  Received in Right upper arm, [place].  Patient tolerated well.

## 2015-08-18 NOTE — Telephone Encounter (Signed)
Received injection today, 7/3.  Thanks

## 2015-10-07 ENCOUNTER — Telehealth: Payer: Self-pay | Admitting: Internal Medicine

## 2015-10-07 DIAGNOSIS — E038 Other specified hypothyroidism: Secondary | ICD-10-CM

## 2015-10-07 DIAGNOSIS — M810 Age-related osteoporosis without current pathological fracture: Secondary | ICD-10-CM

## 2015-10-07 DIAGNOSIS — E785 Hyperlipidemia, unspecified: Secondary | ICD-10-CM

## 2015-10-07 DIAGNOSIS — I10 Essential (primary) hypertension: Secondary | ICD-10-CM

## 2015-10-07 DIAGNOSIS — R5383 Other fatigue: Secondary | ICD-10-CM

## 2015-10-07 NOTE — Telephone Encounter (Signed)
Pt called needing to get labs done before her next appt on 01/12/16. Need orders please and thank you!  Call pt @ [phone].

## 2015-10-07 NOTE — Telephone Encounter (Signed)
Please advise 

## 2015-10-10 NOTE — Telephone Encounter (Signed)
Labs in please schedule patient for lab.

## 2015-10-13 NOTE — Telephone Encounter (Signed)
Ok Pt is scheduled.  °

## 2015-10-28 ENCOUNTER — Other Ambulatory Visit: Payer: Self-pay | Admitting: Internal Medicine

## 2015-10-28 NOTE — Telephone Encounter (Signed)
Rx sent to pharmacy   

## 2015-10-30 DIAGNOSIS — Z23 Encounter for immunization: Secondary | ICD-10-CM | POA: Diagnosis not present

## 2015-12-16 ENCOUNTER — Ambulatory Visit
Admission: RE | Admit: 2015-12-16 | Discharge: 2015-12-16 | Disposition: A | Discharge status: Home / Self Care | Payer: Medicare Other | Source: Ambulatory Visit | Attending: Internal Medicine | Admitting: Internal Medicine

## 2015-12-16 DIAGNOSIS — Z780 Asymptomatic menopausal state: Secondary | ICD-10-CM | POA: Diagnosis not present

## 2015-12-16 DIAGNOSIS — M810 Age-related osteoporosis without current pathological fracture: Secondary | ICD-10-CM

## 2015-12-16 DIAGNOSIS — M8589 Other specified disorders of bone density and structure, multiple sites: Secondary | ICD-10-CM | POA: Diagnosis not present

## 2015-12-18 NOTE — Assessment & Plan Note (Signed)
Improve T Score in Spine and Stable hip score of - 2.1 .  Continue Prolia

## 2015-12-19 ENCOUNTER — Telehealth: Payer: Self-pay | Admitting: Internal Medicine

## 2015-12-19 NOTE — Telephone Encounter (Signed)
Pt called back returning your call. Thank you!  Call pt@ [phone]

## 2015-12-19 NOTE — Telephone Encounter (Signed)
Given dexa results

## 2016-01-05 ENCOUNTER — Other Ambulatory Visit (INDEPENDENT_AMBULATORY_CARE_PROVIDER_SITE_OTHER): Payer: Medicare Other

## 2016-01-05 DIAGNOSIS — M810 Age-related osteoporosis without current pathological fracture: Secondary | ICD-10-CM | POA: Diagnosis not present

## 2016-01-05 DIAGNOSIS — R5383 Other fatigue: Secondary | ICD-10-CM

## 2016-01-05 DIAGNOSIS — E785 Hyperlipidemia, unspecified: Secondary | ICD-10-CM | POA: Diagnosis not present

## 2016-01-05 DIAGNOSIS — I10 Essential (primary) hypertension: Secondary | ICD-10-CM | POA: Diagnosis not present

## 2016-01-05 LAB — COMPREHENSIVE METABOLIC PANEL
ALBUMIN: 4.4 g/dL (ref 3.5–5.2)
ALK PHOS: 39 U/L (ref 39–117)
ALT: 12 U/L (ref 0–35)
AST: 18 U/L (ref 0–37)
BILIRUBIN TOTAL: 0.7 mg/dL (ref 0.2–1.2)
BUN: 15 mg/dL (ref 6–23)
CO2: 31 mEq/L (ref 19–32)
CREATININE: 0.87 mg/dL (ref 0.40–1.20)
Calcium: 9.6 mg/dL (ref 8.4–10.5)
Chloride: 102 mEq/L (ref 96–112)
GFR: 67.26 mL/min (ref >60.00)
GLUCOSE: 101 mg/dL — AB (ref 70–99)
Potassium: 4.7 mEq/L (ref 3.5–5.1)
SODIUM: 140 meq/L (ref 135–145)
TOTAL PROTEIN: 6.8 g/dL (ref 6.0–8.3)

## 2016-01-05 LAB — CBC WITH DIFFERENTIAL/PLATELET
BASOS ABS: 0.1 10*3/uL (ref 0.0–0.1)
Basophils Relative: 1 % (ref 0.0–3.0)
Eosinophils Absolute: 0.1 10*3/uL (ref 0.0–0.7)
Eosinophils Relative: 0.8 % (ref 0.0–5.0)
HCT: 36.9 % (ref 36.0–46.0)
HEMOGLOBIN: 12.5 g/dL (ref 12.0–15.0)
LYMPHS ABS: 2.9 10*3/uL (ref 0.7–4.0)
Lymphocytes Relative: 45.1 % (ref 12.0–46.0)
MCHC: 33.8 g/dL (ref 30.0–36.0)
MCV: 88.3 fl (ref 78.0–100.0)
MONO ABS: 0.5 10*3/uL (ref 0.1–1.0)
Monocytes Relative: 7.5 % (ref 3.0–12.0)
NEUTROS PCT: 45.6 % (ref 43.0–77.0)
Neutro Abs: 3 10*3/uL (ref 1.4–7.7)
Platelets: 244 10*3/uL (ref 150.0–400.0)
RBC: 4.18 Mil/uL (ref 3.87–5.11)
RDW: 14.1 % (ref 11.5–15.5)
WBC: 6.5 10*3/uL (ref 4.0–10.5)

## 2016-01-05 LAB — TSH: TSH: 1.06 u[IU]/mL (ref 0.35–4.50)

## 2016-01-05 LAB — LIPID PANEL
Cholesterol: 233 mg/dL — ABNORMAL HIGH (ref 0–200)
HDL: 86.8 mg/dL (ref >39.00)
LDL Cholesterol: 134 mg/dL — ABNORMAL HIGH (ref 0–99)
NONHDL: 145.89
Total CHOL/HDL Ratio: 3
Triglycerides: 60 mg/dL (ref 0.0–149.0)
VLDL: 12 mg/dL (ref 0.0–40.0)

## 2016-01-05 LAB — VITAMIN D 25 HYDROXY (VIT D DEFICIENCY, FRACTURES): VITD: 45.31 ng/mL (ref 30.00–100.00)

## 2016-01-12 ENCOUNTER — Ambulatory Visit (INDEPENDENT_AMBULATORY_CARE_PROVIDER_SITE_OTHER): Payer: Medicare Other | Admitting: Internal Medicine

## 2016-01-12 DIAGNOSIS — I10 Essential (primary) hypertension: Secondary | ICD-10-CM | POA: Diagnosis not present

## 2016-01-12 DIAGNOSIS — R634 Abnormal weight loss: Secondary | ICD-10-CM | POA: Diagnosis not present

## 2016-01-12 DIAGNOSIS — E7800 Pure hypercholesterolemia, unspecified: Secondary | ICD-10-CM

## 2016-01-12 DIAGNOSIS — Z0000 Encounter for general adult medical examination without abnormal findings: Secondary | ICD-10-CM | POA: Diagnosis not present

## 2016-01-12 DIAGNOSIS — G5702 Lesion of sciatic nerve, left lower limb: Secondary | ICD-10-CM

## 2016-01-12 DIAGNOSIS — M810 Age-related osteoporosis without current pathological fracture: Secondary | ICD-10-CM

## 2016-01-12 NOTE — Assessment & Plan Note (Signed)
Well controlled on current regimen. Renal function stable, no changes today. 

## 2016-01-12 NOTE — Assessment & Plan Note (Signed)
Annual comprehensive preventive exam was done as well as an evaluation and management of chronic conditions .  During the course of the visit the patient was educated and counseled about appropriate screening and preventive services including :  diabetes screening, lipid analysis with projected  10 year  risk for CAD , nutrition counseling, breast, cervical and colorectal cancer screening, and recommended immunizations.  Printed recommendations for health maintenance screenings was given 

## 2016-01-12 NOTE — Assessment & Plan Note (Signed)
Secondary to grief.  Weight is maintained,   I have reviewed her diet and recommended that she increase her protein and fat intake while monitoring her carbohydrates.

## 2016-01-12 NOTE — Patient Instructions (Signed)
Your bone density is improving with Prolia.  Next injection due in January   Next mammogram due after Mar 13 2016  I recommend strengthening your shoulders and upper back using 5 lb wts  Because you are developing kyphisis (poor posture)    Continue all medications as you are doing    Health Maintenance, Female Introduction Adopting a healthy lifestyle and getting preventive care can go a long way to promote health and wellness. Talk with your health care provider about what schedule of regular examinations is right for you. This is a good chance for you to check in with your provider about disease prevention and staying healthy. In between checkups, there are plenty of things you can do on your own. Experts have done a lot of research about which lifestyle changes and preventive measures are most likely to keep you healthy. Ask your health care provider for more information. Weight and diet Eat a healthy diet  Be sure to include plenty of vegetables, fruits, low-fat dairy products, and lean protein.  Do not eat a lot of foods high in solid fats, added sugars, or salt.  Get regular exercise. This is one of the most important things you can do for your health.  Most adults should exercise for at least 150 minutes each week. The exercise should increase your heart rate and make you sweat (moderate-intensity exercise).  Most adults should also do strengthening exercises at least twice a week. This is in addition to the moderate-intensity exercise. Maintain a healthy weight  Body mass index (BMI) is a measurement that can be used to identify possible weight problems. It estimates body fat based on height and weight. Your health care provider can help determine your BMI and help you achieve or maintain a healthy weight.  For females 34 years of age and older:  A BMI below 18.5 is considered underweight.  A BMI of 18.5 to 24.9 is normal.  A BMI of 25 to 29.9 is considered  overweight.  A BMI of 30 and above is considered obese. Watch levels of cholesterol and blood lipids  You should start having your blood tested for lipids and cholesterol at 76 years of age, then have this test every 5 years.  You may need to have your cholesterol levels checked more often if:  Your lipid or cholesterol levels are high.  You are older than 76 years of age.  You are at high risk for heart disease. Cancer screening Lung Cancer  Lung cancer screening is recommended for adults 85-60 years old who are at high risk for lung cancer because of a history of smoking.  A yearly low-dose CT scan of the lungs is recommended for people who:  Currently smoke.  Have quit within the past 15 years.  Have at least a 30-pack-year history of smoking. A pack year is smoking an average of one pack of cigarettes a day for 1 year.  Yearly screening should continue until it has been 15 years since you quit.  Yearly screening should stop if you develop a health problem that would prevent you from having lung cancer treatment. Breast Cancer  Practice breast self-awareness. This means understanding how your breasts normally appear and feel.  It also means doing regular breast self-exams. Let your health care provider know about any changes, no matter how small.  If you are in your 20s or 30s, you should have a clinical breast exam (CBE) by a health care provider every 1-3 years as  part of a regular health exam.  If you are 40 or older, have a CBE every year. Also consider having a breast X-ray (mammogram) every year.  If you have a family history of breast cancer, talk to your health care provider about genetic screening.  If you are at high risk for breast cancer, talk to your health care provider about having an MRI and a mammogram every year.  Breast cancer gene (BRCA) assessment is recommended for women who have family members with BRCA-related cancers. BRCA-related cancers  include:  Breast.  Ovarian.  Tubal.  Peritoneal cancers.  Results of the assessment will determine the need for genetic counseling and BRCA1 and BRCA2 testing. Cervical Cancer  Your health care provider may recommend that you be screened regularly for cancer of the pelvic organs (ovaries, uterus, and vagina). This screening involves a pelvic examination, including checking for microscopic changes to the surface of your cervix (Pap test). You may be encouraged to have this screening done every 3 years, beginning at age 85.  For women ages 77-65, health care providers may recommend pelvic exams and Pap testing every 3 years, or they may recommend the Pap and pelvic exam, combined with testing for human papilloma virus (HPV), every 5 years. Some types of HPV increase your risk of cervical cancer. Testing for HPV may also be done on women of any age with unclear Pap test results.  Other health care providers may not recommend any screening for nonpregnant women who are considered low risk for pelvic cancer and who do not have symptoms. Ask your health care provider if a screening pelvic exam is right for you.  If you have had past treatment for cervical cancer or a condition that could lead to cancer, you need Pap tests and screening for cancer for at least 20 years after your treatment. If Pap tests have been discontinued, your risk factors (such as having a new sexual partner) need to be reassessed to determine if screening should resume. Some women have medical problems that increase the chance of getting cervical cancer. In these cases, your health care provider may recommend more frequent screening and Pap tests. Colorectal Cancer  This type of cancer can be detected and often prevented.  Routine colorectal cancer screening usually begins at 76 years of age and continues through 76 years of age.  Your health care provider may recommend screening at an earlier age if you have risk factors  for colon cancer.  Your health care provider may also recommend using home test kits to check for hidden blood in the stool.  A small camera at the end of a tube can be used to examine your colon directly (sigmoidoscopy or colonoscopy). This is done to check for the earliest forms of colorectal cancer.  Routine screening usually begins at age 37.  Direct examination of the colon should be repeated every 5-10 years through 76 years of age. However, you may need to be screened more often if early forms of precancerous polyps or small growths are found. Skin Cancer  Check your skin from head to toe regularly.  Tell your health care provider about any new moles or changes in moles, especially if there is a change in a mole's shape or color.  Also tell your health care provider if you have a mole that is larger than the size of a pencil eraser.  Always use sunscreen. Apply sunscreen liberally and repeatedly throughout the day.  Protect yourself by wearing long sleeves,  pants, a wide-brimmed hat, and sunglasses whenever you are outside. Heart disease, diabetes, and high blood pressure  High blood pressure causes heart disease and increases the risk of stroke. High blood pressure is more likely to develop in:  People who have blood pressure in the high end of the normal range (130-139/85-89 mm Hg).  People who are overweight or obese.  People who are African American.  If you are 61-38 years of age, have your blood pressure checked every 3-5 years. If you are 26 years of age or older, have your blood pressure checked every year. You should have your blood pressure measured twice-once when you are at a hospital or clinic, and once when you are not at a hospital or clinic. Record the average of the two measurements. To check your blood pressure when you are not at a hospital or clinic, you can use:  An automated blood pressure machine at a pharmacy.  A home blood pressure monitor.  If you  are between 44 years and 90 years old, ask your health care provider if you should take aspirin to prevent strokes.  Have regular diabetes screenings. This involves taking a blood sample to check your fasting blood sugar level.  If you are at a normal weight and have a low risk for diabetes, have this test once every three years after 76 years of age.  If you are overweight and have a high risk for diabetes, consider being tested at a younger age or more often. Preventing infection Hepatitis B  If you have a higher risk for hepatitis B, you should be screened for this virus. You are considered at high risk for hepatitis B if:  You were born in a country where hepatitis B is common. Ask your health care provider which countries are considered high risk.  Your parents were born in a high-risk country, and you have not been immunized against hepatitis B (hepatitis B vaccine).  You have HIV or AIDS.  You use needles to inject street drugs.  You live with someone who has hepatitis B.  You have had sex with someone who has hepatitis B.  You get hemodialysis treatment.  You take certain medicines for conditions, including cancer, organ transplantation, and autoimmune conditions. Hepatitis C  Blood testing is recommended for:  Everyone born from [birth year] through 1966.  Anyone with known risk factors for hepatitis C. Sexually transmitted infections (STIs)  You should be screened for sexually transmitted infections (STIs) including gonorrhea and chlamydia if:  You are sexually active and are younger than 76 years of age.  You are older than 76 years of age and your health care provider tells you that you are at risk for this type of infection.  Your sexual activity has changed since you were last screened and you are at an increased risk for chlamydia or gonorrhea. Ask your health care provider if you are at risk.  If you do not have HIV, but are at risk, it may be recommended that you  take a prescription medicine daily to prevent HIV infection. This is called pre-exposure prophylaxis (PrEP). You are considered at risk if:  You are sexually active and do not regularly use condoms or know the HIV status of your partner(s).  You take drugs by injection.  You are sexually active with a partner who has HIV. Talk with your health care provider about whether you are at high risk of being infected with HIV. If you choose to begin PrEP,  you should first be tested for HIV. You should then be tested every 3 months for as long as you are taking PrEP. Pregnancy  If you are premenopausal and you may become pregnant, ask your health care provider about preconception counseling.  If you may become pregnant, take 400 to 800 micrograms (mcg) of folic acid every day.  If you want to prevent pregnancy, talk to your health care provider about birth control (contraception). Osteoporosis and menopause  Osteoporosis is a disease in which the bones lose minerals and strength with aging. This can result in serious bone fractures. Your risk for osteoporosis can be identified using a bone density scan.  If you are 66 years of age or older, or if you are at risk for osteoporosis and fractures, ask your health care provider if you should be screened.  Ask your health care provider whether you should take a calcium or vitamin D supplement to lower your risk for osteoporosis.  Menopause may have certain physical symptoms and risks.  Hormone replacement therapy may reduce some of these symptoms and risks. Talk to your health care provider about whether hormone replacement therapy is right for you. Follow these instructions at home:  Schedule regular health, dental, and eye exams.  Stay current with your immunizations.  Do not use any tobacco products including cigarettes, chewing tobacco, or electronic cigarettes.  If you are pregnant, do not drink alcohol.  If you are breastfeeding, limit  how much and how often you drink alcohol.  Limit alcohol intake to no more than 1 drink per day for nonpregnant women. One drink equals 12 ounces of beer, 5 ounces of wine, or 1 ounces of hard liquor.  Do not use street drugs.  Do not share needles.  Ask your health care provider for help if you need support or information about quitting drugs.  Tell your health care provider if you often feel depressed.  Tell your health care provider if you have ever been abused or do not feel safe at home. This information is not intended to replace advice given to you by your health care provider. Make sure you discuss any questions you have with your health care provider. Document Released: 08/17/2010 Document Revised: 07/10/2015 Document Reviewed: 11/05/2014  2017 [employer]vier

## 2016-01-12 NOTE — Progress Notes (Signed)
Patient ID: Rita Taylor, female    DOB: 08-21-1940  Age: 75 y.o. MRN: [id number]  The patient is here for follow up on examination and management of other chronic and acute problems.   DEXA done Oct 2017. Reviewed  On Prolia T score -2.3 spine ,-2.1 hip  SS increase since 2015 I nspine but not hip  Colonoscopy normal 2015 Mammogram Jan 2017 Sees [facility] annually in March  No history  of skin cancer    The risk factors are reflected in the social history.  The roster of all physicians providing medical care to patient - is listed in the Snapshot section of the chart.  Activities of daily living:  The patient is 100% independent in all ADLs: dressing, toileting, feeding as well as independent mobility  Home safety : The patient has smoke detectors in the home. They wear seatbelts.  There are no firearms at home. There is no violence in the home.   There is no risks for hepatitis, STDs or HIV. There is no   history of blood transfusion. They have no travel history to infectious disease endemic areas of the world.  The patient has seen their dentist in the last six month. They have seen their eye doctor in the last year. They admit to slight hearing difficulty with regard to whispered voices and some television programs.  They have deferred audiologic testing in the last year.  They do not  have excessive sun exposure. Discussed the need for sun protection: hats, long sleeves and use of sunscreen if there is significant sun exposure.   Diet: the importance of a healthy diet is discussed. They do have a healthy diet.  The benefits of regular aerobic exercise were discussed. She walks 4 times per week ,  20 minutes.   Depression screen: there are no signs or vegative symptoms of depression- irritability, change in appetite, anhedonia, sadness/tearfullness.  The following portions of the patient's history were reviewed and updated as appropriate: allergies, current medications, past family  history, past medical history,  past surgical history, past social history  and problem list.  Visual acuity was not assessed per patient preference since she has regular follow up with her ophthalmologist. Hearing and body mass index were assessed and reviewed.   During the course of the visit the patient was educated and counseled about appropriate screening and preventive services including : fall prevention , diabetes screening, nutrition counseling, colorectal cancer screening, and recommended immunizations.    CC: Diagnoses of Routine general medical examination at a health care facility, Loss of weight, Essential hypertension, Pure hypercholesterolemia, Piriformis syndrome of left side, and Osteoporosis, postmenopausal were pertinent to this visit.  1) flatulence.  No constipation,  No diarrhea,  Using Beano. Healthy diet 2) follow up on hypertension, hyperlipidemia and osteoporosis on Prolia 3) pyriformis syndrome left side improved  History Rita Taylor has a past medical history of Hypertension and Melanoma ([place]) (2008).   She has a past surgical history that includes Gynecologic cryosurgery; Breast enhancement surgery (1990); and bitubal ligation (1981).   Her family history includes Hypertension in her brother, father, and mother.She reports that she has never smoked. She has never used smokeless tobacco. She reports that she drinks alcohol. She reports that she does not use drugs.  Outpatient Medications Prior to Visit  Medication Sig Dispense Refill  . amLODipine (NORVASC) 2.5 MG tablet take 1 tablet by mouth once daily 30 tablet 5  . aspirin 81 MG tablet Take 81 mg by  mouth daily.      . Calcium Carbonate-Vit D-Min (CALCIUM 1200) 1200-1000 MG-UNIT CHEW Chew 1 tablet by mouth daily.      Marland Kitchen losartan (COZAAR) 100 MG tablet Take 1 tablet (100 mg total) by mouth daily. 90 tablet 1  . Red Yeast Rice 600 MG CAPS Take 1 capsule (600 mg total) by mouth 2 (two) times daily. 60 capsule 11  .  cyclobenzaprine (FLEXERIL) 10 MG tablet Take 10 mg by mouth at bedtime. Reported on 07/07/2015    . meloxicam (MOBIC) 15 MG tablet Take 15 mg by mouth daily. Reported on 07/07/2015    . nebivolol (BYSTOLIC) 2.5 MG tablet Take 1 tablet (2.5 mg total) by mouth daily. (Patient not taking: Reported on 01/12/2016) 30 tablet 1   No facility-administered medications prior to visit.     Review of Systems   Patient denies headache, fevers, malaise, unintentional weight loss, skin rash, eye pain, sinus congestion and sinus pain, sore throat, dysphagia,  hemoptysis , cough, dyspnea, wheezing, chest pain, palpitations, orthopnea, edema, abdominal pain, nausea, melena, diarrhea, constipation, flank pain, dysuria, hematuria, urinary  Frequency, nocturia, numbness, tingling, seizures,  Focal weakness, Loss of consciousness,  Tremor, insomnia, depression, anxiety, and suicidal ideation.      Objective:  BP 136/68   Pulse 65   Temp 97.4 F (36.3 C) (Oral)   Resp 12   Ht 5' 2.75" (1.594 m)   Wt 113 lb (51.3 kg)   SpO2 98%   BMI 20.18 kg/m   Physical Exam   General appearance: alert, cooperative and appears stated age Head: Normocephalic, without obvious abnormality, atraumatic Eyes: conjunctivae/corneas clear. PERRL, EOM's intact. Fundi benign. Ears: normal TM's and external ear canals both ears Nose: Nares normal. Septum midline. Mucosa normal. No drainage or sinus tenderness. Throat: lips, mucosa, and tongue normal; teeth and gums normal Neck: no adenopathy, no carotid bruit, no JVD, supple, symmetrical, trachea midline and thyroid not enlarged, symmetric, no tenderness/mass/nodules Lungs: clear to auscultation bilaterally Breasts: implants. normal appearance, no masses or tenderness Heart: regular rate and rhythm, S1, S2 normal, no murmur, click, rub or gallop Abdomen: soft, non-tender; bowel sounds normal; no masses,  no organomegaly Extremities: extremities normal, atraumatic, no cyanosis or  edema Pulses: 2+ and symmetric MSK: mild kyphosis of upper spine  Skin: Skin color, texture, turgor normal. No rashes or lesions Neurologic: Alert and oriented X 3, normal strength and tone. Normal symmetric reflexes. Normal coordination and gait.      Assessment & Plan:   Problem List Items Addressed This Visit    Hyperlipidemia    Improved with Red Yeast  Rice. Patient dies no want to use statins.   ,  Lab Results  Component Value Date   CHOL 233 (H) 01/05/2016   HDL 86.80 01/05/2016   LDLCALC 134 (H) 01/05/2016   LDLDIRECT 137.0 01/06/2015   TRIG 60.0 01/05/2016   CHOLHDL 3 01/05/2016         Hypertension    Well controlled on current regimen. Renal function stable, no changes today.      Loss of weight    Secondary to grief.  Weight is maintained,   I have reviewed her diet and recommended that she increase her protein and fat intake while monitoring her carbohydrates.       Osteoporosis, postmenopausal    Now on Prolia for the past year.  DEXA done this year shows a SS increase since 2015.       Piriformis syndrome of left  side    Improved with use of fish oil , meloxicam, and turmeric      Routine general medical examination at a health care facility    Annual comprehensive preventive exam was done as well as an evaluation and management of chronic conditions .  During the course of the visit the patient was educated and counseled about appropriate screening and preventive services including :  diabetes screening, lipid analysis with projected  10 year  risk for CAD , nutrition counseling, breast, cervical and colorectal cancer screening, and recommended immunizations.  Printed recommendations for health maintenance screenings was given         I have discontinued Rita Taylor's cyclobenzaprine, meloxicam, and nebivolol. I am also having her maintain her aspirin, CALCIUM 1200, Red Yeast Rice, losartan, amLODipine, Fish Oil, and Turmeric.  Meds ordered this  encounter  Medications  . Omega-3 Fatty Acids (FISH OIL) 1000 MG CAPS    Sig: Take 1 capsule by mouth 2 (two) times daily.  . Turmeric 500 MG CAPS    Sig: Take 1 capsule by mouth 2 (two) times daily.   A total of 40 minutes was spent with patient more than half of which was spent in counseling patient on the above mentioned issues , reviewing and explaining recent labs and imaging studies done, and coordination of care.  Medications Discontinued During This Encounter  Medication Reason  . cyclobenzaprine (FLEXERIL) 10 MG tablet Completed Course  . meloxicam (MOBIC) 15 MG tablet Error  . nebivolol (BYSTOLIC) 2.5 MG tablet Error    Follow-up: No Follow-up on file.   Crecencio Mc, MD

## 2016-01-12 NOTE — Assessment & Plan Note (Signed)
Improved with Red Yeast  Rice. Patient dies no want to use statins.   ,  Lab Results  Component Value Date   CHOL 233 (H) 01/05/2016   HDL 86.80 01/05/2016   LDLCALC 134 (H) 01/05/2016   LDLDIRECT 137.0 01/06/2015   TRIG 60.0 01/05/2016   CHOLHDL 3 01/05/2016

## 2016-01-12 NOTE — Progress Notes (Signed)
Pre-visit discussion using our clinic review tool. No additional management support is needed unless otherwise documented below in the visit note.  

## 2016-01-13 ENCOUNTER — Encounter: Payer: Self-pay | Admitting: Internal Medicine

## 2016-01-13 NOTE — Assessment & Plan Note (Signed)
Now on Prolia for the past year.  DEXA done this year shows a SS increase since 2015.

## 2016-01-13 NOTE — Assessment & Plan Note (Signed)
Improved with use of fish oil , meloxicam, and turmeric

## 2016-01-22 ENCOUNTER — Other Ambulatory Visit: Payer: Self-pay

## 2016-01-23 ENCOUNTER — Ambulatory Visit: Payer: Medicare Other

## 2016-01-28 ENCOUNTER — Other Ambulatory Visit: Payer: Self-pay | Admitting: Internal Medicine

## 2016-02-19 ENCOUNTER — Ambulatory Visit (INDEPENDENT_AMBULATORY_CARE_PROVIDER_SITE_OTHER): Payer: Medicare Other

## 2016-02-19 DIAGNOSIS — M810 Age-related osteoporosis without current pathological fracture: Secondary | ICD-10-CM | POA: Diagnosis not present

## 2016-02-19 MED ORDER — DENOSUMAB 60 MG/ML ~~LOC~~ SOLN
60.0000 mg | Freq: Once | SUBCUTANEOUS | Status: AC
  Administered 2016-02-19: 60 mg via SUBCUTANEOUS

## 2016-02-19 NOTE — Progress Notes (Signed)
Patient comes in for Prolia injection.  Injected left arm subcutaneously.  Patient tolerated injection  well.    

## 2016-03-02 ENCOUNTER — Ambulatory Visit: Payer: Medicare Other

## 2016-03-25 DIAGNOSIS — M25649 Stiffness of unspecified hand, not elsewhere classified: Secondary | ICD-10-CM | POA: Diagnosis not present

## 2016-03-25 DIAGNOSIS — M65311 Trigger thumb, right thumb: Secondary | ICD-10-CM | POA: Diagnosis not present

## 2016-04-23 DIAGNOSIS — D692 Other nonthrombocytopenic purpura: Secondary | ICD-10-CM | POA: Diagnosis not present

## 2016-04-23 DIAGNOSIS — L821 Other seborrheic keratosis: Secondary | ICD-10-CM | POA: Diagnosis not present

## 2016-04-23 DIAGNOSIS — Z85820 Personal history of malignant melanoma of skin: Secondary | ICD-10-CM | POA: Diagnosis not present

## 2016-04-23 DIAGNOSIS — D225 Melanocytic nevi of trunk: Secondary | ICD-10-CM | POA: Diagnosis not present

## 2016-04-28 ENCOUNTER — Other Ambulatory Visit: Payer: Self-pay | Admitting: Internal Medicine

## 2016-04-28 ENCOUNTER — Telehealth: Payer: Self-pay | Admitting: *Deleted

## 2016-04-28 DIAGNOSIS — I10 Essential (primary) hypertension: Secondary | ICD-10-CM

## 2016-04-28 DIAGNOSIS — E7800 Pure hypercholesterolemia, unspecified: Secondary | ICD-10-CM

## 2016-04-28 NOTE — Telephone Encounter (Signed)
Patient requested to have labs ordered prior to her appt on 07/15/15 Pt contact [phone]

## 2016-04-28 NOTE — Telephone Encounter (Signed)
What labs need to be ordered 

## 2016-05-03 NOTE — Telephone Encounter (Signed)
Spoke with pt and scheduled the pt a lab appt for 07/08/2016 at 9:00am. Pt is aware of appt date and time and that she needs to be fasting for these labs.

## 2016-06-28 DIAGNOSIS — Z9842 Cataract extraction status, left eye: Secondary | ICD-10-CM | POA: Diagnosis not present

## 2016-06-28 DIAGNOSIS — H26493 Other secondary cataract, bilateral: Secondary | ICD-10-CM | POA: Diagnosis not present

## 2016-06-28 DIAGNOSIS — H52223 Regular astigmatism, bilateral: Secondary | ICD-10-CM | POA: Diagnosis not present

## 2016-06-28 DIAGNOSIS — Z9841 Cataract extraction status, right eye: Secondary | ICD-10-CM | POA: Diagnosis not present

## 2016-07-08 ENCOUNTER — Other Ambulatory Visit (INDEPENDENT_AMBULATORY_CARE_PROVIDER_SITE_OTHER): Payer: Medicare Other

## 2016-07-08 DIAGNOSIS — I10 Essential (primary) hypertension: Secondary | ICD-10-CM | POA: Diagnosis not present

## 2016-07-08 DIAGNOSIS — E7800 Pure hypercholesterolemia, unspecified: Secondary | ICD-10-CM

## 2016-07-08 LAB — COMPREHENSIVE METABOLIC PANEL
ALT: 15 U/L (ref 0–35)
AST: 21 U/L (ref 0–37)
Albumin: 4.4 g/dL (ref 3.5–5.2)
Alkaline Phosphatase: 40 U/L (ref 39–117)
BUN: 18 mg/dL (ref 6–23)
CHLORIDE: 102 meq/L (ref 96–112)
CO2: 29 meq/L (ref 19–32)
CREATININE: 0.94 mg/dL (ref 0.40–1.20)
Calcium: 9.6 mg/dL (ref 8.4–10.5)
GFR: 61.43 mL/min (ref >60.00)
Glucose, Bld: 92 mg/dL (ref 70–99)
POTASSIUM: 4.1 meq/L (ref 3.5–5.1)
SODIUM: 138 meq/L (ref 135–145)
Total Bilirubin: 0.6 mg/dL (ref 0.2–1.2)
Total Protein: 6.7 g/dL (ref 6.0–8.3)

## 2016-07-08 LAB — LIPID PANEL
Cholesterol: 253 mg/dL — ABNORMAL HIGH (ref 0–200)
HDL: 80.4 mg/dL (ref >39.00)
LDL CALC: 160 mg/dL — AB (ref 0–99)
NONHDL: 172.61
Total CHOL/HDL Ratio: 3
Triglycerides: 62 mg/dL (ref 0.0–149.0)
VLDL: 12.4 mg/dL (ref 0.0–40.0)

## 2016-07-14 ENCOUNTER — Ambulatory Visit (INDEPENDENT_AMBULATORY_CARE_PROVIDER_SITE_OTHER): Payer: Medicare Other | Admitting: Internal Medicine

## 2016-07-14 ENCOUNTER — Encounter: Payer: Self-pay | Admitting: Internal Medicine

## 2016-07-14 VITALS — BP 148/70 | HR 62 | Temp 97.7°F | Resp 15 | Ht 62.75 in | Wt 113.2 lb

## 2016-07-14 DIAGNOSIS — E7800 Pure hypercholesterolemia, unspecified: Secondary | ICD-10-CM

## 2016-07-14 DIAGNOSIS — G5702 Lesion of sciatic nerve, left lower limb: Secondary | ICD-10-CM

## 2016-07-14 DIAGNOSIS — I10 Essential (primary) hypertension: Secondary | ICD-10-CM | POA: Diagnosis not present

## 2016-07-14 DIAGNOSIS — M1811 Unilateral primary osteoarthritis of first carpometacarpal joint, right hand: Secondary | ICD-10-CM | POA: Diagnosis not present

## 2016-07-14 DIAGNOSIS — Z79899 Other long term (current) drug therapy: Secondary | ICD-10-CM | POA: Diagnosis not present

## 2016-07-14 MED ORDER — PRAVASTATIN SODIUM 20 MG PO TABS: 20.0000 mg | ORAL_TABLET | Freq: Every day | ORAL | 0 refills | Status: DC

## 2016-07-14 NOTE — Patient Instructions (Addendum)
You did the right thing about your thumb pain.!  There is no good way to exercise that type of pain away!  You can take up to 2000 mg of tylenol every day safely,  It will not change your blood pressure or affect your kidney function  No changes to blood pressure medications today   Based on your fasting cholesterol and your concurrent history of hypertension ,  your 10 year risk of having some type of coronary event (including heart attack) is 21% , meaning that one of  every  5 women  with the same medical statistics will have a heart attack in the next 10 years.     The [employer] recommends starting patients on moderate intensity statin therapy for people with this risk  to lower your risks of these events.  We discussed  starting a medication called pravastatin to lower your risk of heart attacks and strokes.   Return 3 weeks after starting the medication ot have liver enzymes checked

## 2016-07-14 NOTE — Progress Notes (Signed)
Subjective:  Patient ID: Rita Taylor, female    DOB: 1940/07/11  Age: 76 y.o. MRN: [id number]  CC: The primary encounter diagnosis was Long-term use of high-risk medication. Diagnoses of Pure hypercholesterolemia, Essential hypertension, Piriformis syndrome of left side, and Primary osteoarthritis of first carpometacarpal joint of right hand were also pertinent to this visit.  HPI Rita Taylor presents for 6 month follow up on Hypertensionand other issues.   patient checks blood pressure twice weekly at home.  Readings have been for the most part < 130/70 at rest . Patient is following a reduced salt diet most days and is taking medications as prescribed.  Had a period of dizziness  Until she changed her timing to one in the am and one at night .   Recent fasting labs reviewed   Cholesterol higher LD 134 , now 160 HDL 80  framingham risk 21%  .  Taking yeast rice twice daily   Had a c/s injection in right thumb at [facility]. Done several weeks ago, pain improved.   2016 piriformis syndrome resolved since seeing [facility] Phlegm for exercises.     Outpatient Medications Prior to Visit  Medication Sig Dispense Refill  . amLODipine (NORVASC) 2.5 MG tablet take 1 tablet by mouth once daily 30 tablet 5  . aspirin 81 MG tablet Take 81 mg by mouth daily.      . Calcium Carbonate-Vit D-Min (CALCIUM 1200) 1200-1000 MG-UNIT CHEW Chew 1 tablet by mouth daily.      Marland Kitchen losartan (COZAAR) 100 MG tablet take 1 tablet by mouth once daily 90 tablet 1  . Omega-3 Fatty Acids (FISH OIL) 1000 MG CAPS Take 1 capsule by mouth 2 (two) times daily.    . Red Yeast Rice 600 MG CAPS Take 1 capsule (600 mg total) by mouth 2 (two) times daily. 60 capsule 11  . Turmeric 500 MG CAPS Take 1 capsule by mouth 2 (two) times daily.     No facility-administered medications prior to visit.     Review of Systems;  Patient denies headache, fevers, malaise, unintentional weight loss, skin rash, eye pain, sinus  congestion and sinus pain, sore throat, dysphagia,  hemoptysis , cough, dyspnea, wheezing, chest pain, palpitations, orthopnea, edema, abdominal pain, nausea, melena, diarrhea, constipation, flank pain, dysuria, hematuria, urinary  Frequency, nocturia, numbness, tingling, seizures,  Focal weakness, Loss of consciousness,  Tremor, insomnia, depression, anxiety, and suicidal ideation.      Objective:  BP (!) 148/70 (BP Location: Left Arm, Patient Position: Sitting, Cuff Size: Normal)   Pulse 62   Temp 97.7 F (36.5 C) (Oral)   Resp 15   Ht 5' 2.75" (1.594 m)   Wt 113 lb 3.2 oz (51.3 kg)   SpO2 99%   BMI 20.21 kg/m   BP Readings from Last 3 Encounters:  07/14/16 (!) 148/70  01/12/16 136/68  08/12/15 128/68    Wt Readings from Last 3 Encounters:  07/14/16 113 lb 3.2 oz (51.3 kg)  01/12/16 113 lb (51.3 kg)  07/07/15 112 lb 8 oz (51 kg)    General appearance: alert, cooperative and appears stated age Ears: normal TM's and external ear canals both ears Throat: lips, mucosa, and tongue normal; teeth and gums normal Neck: no adenopathy, no carotid bruit, supple, symmetrical, trachea midline and thyroid not enlarged, symmetric, no tenderness/mass/nodules Back: symmetric, no curvature. ROM normal. No CVA tenderness. Lungs: clear to auscultation bilaterally Heart: regular rate and rhythm, S1, S2 normal, no murmur, click, rub  or gallop Abdomen: soft, non-tender; bowel sounds normal; no masses,  no organomegaly Pulses: 2+ and symmetric Skin: Skin color, texture, turgor normal. No rashes or lesions Lymph nodes: Cervical, supraclavicular, and axillary nodes normal.  No results found for: HGBA1C  Lab Results  Component Value Date   CREATININE 0.94 07/08/2016   CREATININE 0.87 01/05/2016   CREATININE 0.84 08/12/2015    Lab Results  Component Value Date   WBC 6.5 01/05/2016   HGB 12.5 01/05/2016   HCT 36.9 01/05/2016   PLT 244.0 01/05/2016   GLUCOSE 92 07/08/2016   CHOL 253 (H)  07/08/2016   TRIG 62.0 07/08/2016   HDL 80.40 07/08/2016   LDLDIRECT 137.0 01/06/2015   LDLCALC 160 (H) 07/08/2016   ALT 15 07/08/2016   AST 21 07/08/2016   NA 138 07/08/2016   K 4.1 07/08/2016   CL 102 07/08/2016   CREATININE 0.94 07/08/2016   BUN 18 07/08/2016   CO2 29 07/08/2016   TSH 1.06 01/05/2016   MICROALBUR 0.1 01/26/2012    Dg Bone Density  Result Date: 12/16/2015 EXAM: DUAL X-RAY ABSORPTIOMETRY (DXA) FOR BONE MINERAL DENSITY IMPRESSION: Referring Physician:  Deborra Medina, MD PATIENT: Name: Rita Taylor Patient ID: [id number] Birth Date: 1940/03/29 Height: 62.5 in. Sex: Female Measured: 12/16/2015 Weight: 112.0 lbs. Indications: Advanced Age, Caucasian, Estrogen Deficient, Height Loss (781.91), History of Osteoporosis, Postmenopausal Fractures: None Treatments: Calcium (E943.0), Prolia, Vitamin D (E933.5) ASSESSMENT: The BMD measured at AP Spine L1-L4 is 0.905 g/cm2 with a T-score of -2.3. This patient is considered OSTEOPENIC according to [facility]) criteria. There has been a statistically significant increase in BMD of the lumbar spine since prior exam of 12/14/2013. There has been no statistically significant change in BMD of the left hip since prior exam of 12/14/2013. Patient does not meet criteria for FRAX assessment. Site Region Measured Date Measured Age YA BMD Significant CHANGE T-score AP Spine  L1-L4     12/16/2015    [age]         -2.3    0.905 g/cm2 * DualFemur Neck Left 12/16/2015    [age]         -2.1    0.749 g/cm2 World Health Organization [facility]) criteria for post-menopausal, Caucasian Women: Normal       T-score at or above -1 SD Osteopenia   T-score between -1 and -2.5 SD Osteoporosis T-score at or below -2.5 SD RECOMMENDATION: [place] recommends that FDA-approved medical therapies be considered in postmenopausal women and men age 73 or older with a: 1. Hip or vertebral (clinical or morphometric) fracture. 2. T-score of  <-2.5 at the spine or hip. 3. Ten-year fracture probability by FRAX of 3% or greater for hip fracture or 20% or greater for major osteoporotic fracture. All treatment decisions require clinical judgment and consideration of individual patient factors, including patient preferences, co-morbidities, previous drug use, risk factors not captured in the FRAX model (e.g. falls, vitamin D deficiency, increased bone turnover, interval significant decline in bone density) and possible under - or over-estimation of fracture risk by FRAX. All patients should ensure an adequate intake of dietary calcium (1200 mg/d) and vitamin D (800 IU daily) unless contraindicated. FOLLOW-UP: People with diagnosed cases of osteoporosis or at high risk for fracture should have regular bone mineral density tests. For patients eligible for Medicare, routine testing is allowed once every 2 years. The testing frequency can be increased to one year for patients who have rapidly progressing disease, those who are  receiving or discontinuing medical therapy to restore bone mass, or have additional risk factors. I have reviewed this report, and agree with the above findings. Mark A. Thornton Papas, M.D. [facility] Radiology Electronically Signed   By: Lavonia Dana M.D.   On: 12/16/2015 14:15    Assessment & Plan:   Problem List Items Addressed This Visit    Piriformis syndrome of left side    Resolved with exercise and PT       Osteoarthritis of basilar joint of thumb    Treated with c/s injection several weeks ago by [facility].  Continue tylenol prn       Hypertension    Well controlled on current regimen. Renal function stable, no changes today.  Lab Results  Component Value Date   CREATININE 0.94 07/08/2016   Lab Results  Component Value Date   NA 138 07/08/2016   K 4.1 07/08/2016   CL 102 07/08/2016   CO2 29 07/08/2016         Relevant Medications   pravastatin (PRAVACHOL) 20 MG tablet   Hyperlipidemia    Based on current  lipid profile, the risk of clinically significant Coronary artery disease is 21 % over the next 10 years, using the Framingham risk calculator, despite a trial of red yeast rice.  She has agreed to a Pravastatin trial and return in 3 weeks for a hepatic panel .  Lab Results  Component Value Date   CHOL 253 (H) 07/08/2016   HDL 80.40 07/08/2016   LDLCALC 160 (H) 07/08/2016   LDLDIRECT 137.0 01/06/2015   TRIG 62.0 07/08/2016   CHOLHDL 3 07/08/2016         Relevant Medications   pravastatin (PRAVACHOL) 20 MG tablet    Other Visit Diagnoses    Long-term use of high-risk medication    -  Primary   Relevant Orders   Hepatic function panel     A total of 25 minutes of face to face time was spent with patient more than half of which was spent in counselling and coordination of care   I am having Rita Taylor start on pravastatin. I am also having her maintain her aspirin, CALCIUM 1200, Red Yeast Rice, Fish Oil, Turmeric, losartan, and amLODipine.  Meds ordered this encounter  Medications  . pravastatin (PRAVACHOL) 20 MG tablet    Sig: Take 1 tablet (20 mg total) by mouth daily.    Dispense:  90 tablet    Refill:  0    There are no discontinued medications.  Follow-up: Return in about 6 months (around 01/14/2017).   Crecencio Mc, MD

## 2016-07-17 DIAGNOSIS — M189 Osteoarthritis of first carpometacarpal joint, unspecified: Secondary | ICD-10-CM | POA: Insufficient documentation

## 2016-07-17 NOTE — Assessment & Plan Note (Signed)
Resolved with exercise and PT

## 2016-07-17 NOTE — Assessment & Plan Note (Signed)
Treated with c/s injection several weeks ago by [facility].  Continue tylenol prn

## 2016-07-17 NOTE — Assessment & Plan Note (Signed)
Well controlled on current regimen. Renal function stable, no changes today.  Lab Results  Component Value Date   CREATININE 0.94 07/08/2016   Lab Results  Component Value Date   NA 138 07/08/2016   K 4.1 07/08/2016   CL 102 07/08/2016   CO2 29 07/08/2016

## 2016-07-17 NOTE — Assessment & Plan Note (Addendum)
Based on current lipid profile, the risk of clinically significant Coronary artery disease is 21 % over the next 10 years, using the Framingham risk calculator, despite a trial of red yeast rice.  She has agreed to a Pravastatin trial and return in 3 weeks for a hepatic panel .  Lab Results  Component Value Date   CHOL 253 (H) 07/08/2016   HDL 80.40 07/08/2016   LDLCALC 160 (H) 07/08/2016   LDLDIRECT 137.0 01/06/2015   TRIG 62.0 07/08/2016   CHOLHDL 3 07/08/2016

## 2016-07-23 ENCOUNTER — Other Ambulatory Visit: Payer: Self-pay | Admitting: Internal Medicine

## 2016-08-04 ENCOUNTER — Other Ambulatory Visit (INDEPENDENT_AMBULATORY_CARE_PROVIDER_SITE_OTHER): Payer: Medicare Other

## 2016-08-04 DIAGNOSIS — Z79899 Other long term (current) drug therapy: Secondary | ICD-10-CM | POA: Diagnosis not present

## 2016-08-04 DIAGNOSIS — E7800 Pure hypercholesterolemia, unspecified: Secondary | ICD-10-CM

## 2016-08-04 LAB — HEPATIC FUNCTION PANEL
ALK PHOS: 41 U/L (ref 39–117)
ALT: 13 U/L (ref 0–35)
AST: 19 U/L (ref 0–37)
Albumin: 4.3 g/dL (ref 3.5–5.2)
BILIRUBIN DIRECT: 0.1 mg/dL (ref 0.0–0.3)
BILIRUBIN TOTAL: 0.5 mg/dL (ref 0.2–1.2)
TOTAL PROTEIN: 6.5 g/dL (ref 6.0–8.3)

## 2016-08-05 NOTE — Addendum Note (Signed)
Addended by: Crecencio Mc on: 08/05/2016 03:57 PM   Modules accepted: Orders

## 2016-08-31 ENCOUNTER — Ambulatory Visit (INDEPENDENT_AMBULATORY_CARE_PROVIDER_SITE_OTHER): Payer: Medicare Other | Admitting: *Deleted

## 2016-08-31 DIAGNOSIS — M810 Age-related osteoporosis without current pathological fracture: Secondary | ICD-10-CM

## 2016-08-31 MED ORDER — DENOSUMAB 60 MG/ML ~~LOC~~ SOLN
60.0000 mg | Freq: Once | SUBCUTANEOUS | Status: AC
  Administered 2016-08-31: 60 mg via SUBCUTANEOUS

## 2016-08-31 NOTE — Progress Notes (Signed)
Patient presented for Prolia injection to Right arm Rita Taylor, patient voiced no concerns nor any C/O discomfort during injection. 

## 2016-10-08 ENCOUNTER — Other Ambulatory Visit: Payer: Self-pay | Admitting: Internal Medicine

## 2016-10-26 ENCOUNTER — Other Ambulatory Visit: Payer: Self-pay | Admitting: Internal Medicine

## 2016-11-05 ENCOUNTER — Other Ambulatory Visit (INDEPENDENT_AMBULATORY_CARE_PROVIDER_SITE_OTHER): Payer: Medicare Other

## 2016-11-05 DIAGNOSIS — Z79899 Other long term (current) drug therapy: Secondary | ICD-10-CM | POA: Diagnosis not present

## 2016-11-05 DIAGNOSIS — E7800 Pure hypercholesterolemia, unspecified: Secondary | ICD-10-CM | POA: Diagnosis not present

## 2016-11-05 LAB — COMPREHENSIVE METABOLIC PANEL
ALBUMIN: 4.4 g/dL (ref 3.5–5.2)
ALK PHOS: 38 U/L — AB (ref 39–117)
ALT: 13 U/L (ref 0–35)
AST: 17 U/L (ref 0–37)
BUN: 20 mg/dL (ref 6–23)
CO2: 31 mEq/L (ref 19–32)
Calcium: 9.4 mg/dL (ref 8.4–10.5)
Chloride: 103 mEq/L (ref 96–112)
Creatinine, Ser: 0.88 mg/dL (ref 0.40–1.20)
GFR: 66.23 mL/min (ref >60.00)
Glucose, Bld: 93 mg/dL (ref 70–99)
POTASSIUM: 4.6 meq/L (ref 3.5–5.1)
SODIUM: 140 meq/L (ref 135–145)
TOTAL PROTEIN: 6.4 g/dL (ref 6.0–8.3)
Total Bilirubin: 0.7 mg/dL (ref 0.2–1.2)

## 2016-11-05 LAB — LIPID PANEL
CHOLESTEROL: 176 mg/dL (ref 0–200)
HDL: 79.8 mg/dL (ref >39.00)
LDL Cholesterol: 86 mg/dL (ref 0–99)
NonHDL: 96.62
Total CHOL/HDL Ratio: 2
Triglycerides: 52 mg/dL (ref 0.0–149.0)
VLDL: 10.4 mg/dL (ref 0.0–40.0)

## 2016-11-10 DIAGNOSIS — Z23 Encounter for immunization: Secondary | ICD-10-CM | POA: Diagnosis not present

## 2016-11-10 NOTE — Telephone Encounter (Signed)
Error

## 2017-01-11 ENCOUNTER — Ambulatory Visit: Payer: Medicare Other

## 2017-01-11 ENCOUNTER — Other Ambulatory Visit: Payer: Self-pay | Admitting: Internal Medicine

## 2017-01-13 ENCOUNTER — Ambulatory Visit (INDEPENDENT_AMBULATORY_CARE_PROVIDER_SITE_OTHER): Payer: Medicare Other | Admitting: Internal Medicine

## 2017-01-13 ENCOUNTER — Encounter: Payer: Self-pay | Admitting: Internal Medicine

## 2017-01-13 VITALS — BP 140/68 | HR 67 | Temp 98.0°F | Resp 15 | Ht 62.75 in | Wt 114.8 lb

## 2017-01-13 DIAGNOSIS — Z1231 Encounter for screening mammogram for malignant neoplasm of breast: Secondary | ICD-10-CM

## 2017-01-13 DIAGNOSIS — I10 Essential (primary) hypertension: Secondary | ICD-10-CM | POA: Diagnosis not present

## 2017-01-13 DIAGNOSIS — E7800 Pure hypercholesterolemia, unspecified: Secondary | ICD-10-CM

## 2017-01-13 DIAGNOSIS — M810 Age-related osteoporosis without current pathological fracture: Secondary | ICD-10-CM | POA: Diagnosis not present

## 2017-01-13 DIAGNOSIS — Z1239 Encounter for other screening for malignant neoplasm of breast: Secondary | ICD-10-CM

## 2017-01-13 MED ORDER — ZOSTER VAC RECOMB ADJUVANTED 50 MCG/0.5ML IM SUSR: 0.5000 mL | Freq: Once | INTRAMUSCULAR | 1 refills | Status: AC

## 2017-01-13 NOTE — Patient Instructions (Signed)
I am ordering your mammogram  You do not need fasting labs until  [facility] March.  I have ordered them  Today  Continue your current mediations    Health Maintenance for Postmenopausal Women Menopause is a normal process in which your reproductive ability comes to an end. This process happens gradually over a span of months to years, usually between the ages of 40 and 45. Menopause is complete when you have missed 12 consecutive menstrual periods. It is important to talk with your health care provider about some of the most common conditions that affect postmenopausal women, such as heart disease, cancer, and bone loss (osteoporosis). Adopting a healthy lifestyle and getting preventive care can help to promote your health and wellness. Those actions can also lower your chances of developing some of these common conditions. What should I know about menopause? During menopause, you may experience a number of symptoms, such as:  Moderate-to-severe hot flashes.  Night sweats.  Decrease in sex drive.  Mood swings.  Headaches.  Tiredness.  Irritability.  Memory problems.  Insomnia.  Choosing to treat or not to treat menopausal changes is an individual decision that you make with your health care provider. What should I know about hormone replacement therapy and supplements? Hormone therapy products are effective for treating symptoms that are associated with menopause, such as hot flashes and night sweats. Hormone replacement carries certain risks, especially as you become older. If you are thinking about using estrogen or estrogen with progestin treatments, discuss the benefits and risks with your health care provider. What should I know about heart disease and stroke? Heart disease, heart attack, and stroke become more likely as you age. This may be due, in part, to the hormonal changes that your body experiences during menopause. These can affect how your body processes dietary fats,  triglycerides, and cholesterol. Heart attack and stroke are both medical emergencies. There are many things that you can do to help prevent heart disease and stroke:  Have your blood pressure checked at least every 1-2 years. High blood pressure causes heart disease and increases the risk of stroke.  If you are 24-67 years old, ask your health care provider if you should take aspirin to prevent a heart attack or a stroke.  Do not use any tobacco products, including cigarettes, chewing tobacco, or electronic cigarettes. If you need help quitting, ask your health care provider.  It is important to eat a healthy diet and maintain a healthy weight. ? Be sure to include plenty of vegetables, fruits, low-fat dairy products, and lean protein. ? Avoid eating foods that are high in solid fats, added sugars, or salt (sodium).  Get regular exercise. This is one of the most important things that you can do for your health. ? Try to exercise for at least 150 minutes each week. The type of exercise that you do should increase your heart rate and make you sweat. This is known as moderate-intensity exercise. ? Try to do strengthening exercises at least twice each week. Do these in addition to the moderate-intensity exercise.  Know your numbers.Ask your health care provider to check your cholesterol and your blood glucose. Continue to have your blood tested as directed by your health care provider.  What should I know about cancer screening? There are several types of cancer. Take the following steps to reduce your risk and to catch any cancer development as early as possible. Breast Cancer  Practice breast self-awareness. ? This means understanding how your  breasts normally appear and feel. ? It also means doing regular breast self-exams. Let your health care provider know about any changes, no matter how small.  If you are 26 or older, have a clinician do a breast exam (clinical breast exam or CBE)  every year. Depending on your age, family history, and medical history, it may be recommended that you also have a yearly breast X-ray (mammogram).  If you have a family history of breast cancer, talk with your health care provider about genetic screening.  If you are at high risk for breast cancer, talk with your health care provider about having an MRI and a mammogram every year.  Breast cancer (BRCA) gene test is recommended for women who have family members with BRCA-related cancers. Results of the assessment will determine the need for genetic counseling and BRCA1 and for BRCA2 testing. BRCA-related cancers include these types: ? Breast. This occurs in males or females. ? Ovarian. ? Tubal. This may also be called fallopian tube cancer. ? Cancer of the abdominal or pelvic lining (peritoneal cancer). ? Prostate. ? Pancreatic.  Cervical, Uterine, and Ovarian Cancer Your health care provider may recommend that you be screened regularly for cancer of the pelvic organs. These include your ovaries, uterus, and vagina. This screening involves a pelvic exam, which includes checking for microscopic changes to the surface of your cervix (Pap test).  For women ages 21-65, health care providers may recommend a pelvic exam and a Pap test every three years. For women ages 40-65, they may recommend the Pap test and pelvic exam, combined with testing for human papilloma virus (HPV), every five years. Some types of HPV increase your risk of cervical cancer. Testing for HPV may also be done on women of any age who have unclear Pap test results.  Other health care providers may not recommend any screening for nonpregnant women who are considered low risk for pelvic cancer and have no symptoms. Ask your health care provider if a screening pelvic exam is right for you.  If you have had past treatment for cervical cancer or a condition that could lead to cancer, you need Pap tests and screening for cancer for at  least 20 years after your treatment. If Pap tests have been discontinued for you, your risk factors (such as having a new sexual partner) need to be reassessed to determine if you should start having screenings again. Some women have medical problems that increase the chance of getting cervical cancer. In these cases, your health care provider may recommend that you have screening and Pap tests more often.  If you have a family history of uterine cancer or ovarian cancer, talk with your health care provider about genetic screening.  If you have vaginal bleeding after reaching menopause, tell your health care provider.  There are currently no reliable tests available to screen for ovarian cancer.  Lung Cancer Lung cancer screening is recommended for adults 81-24 years old who are at high risk for lung cancer because of a history of smoking. A yearly low-dose CT scan of the lungs is recommended if you:  Currently smoke.  Have a history of at least 30 pack-years of smoking and you currently smoke or have quit within the past 15 years. A pack-year is smoking an average of one pack of cigarettes per day for one year.  Yearly screening should:  Continue until it has been 15 years since you quit.  Stop if you develop a health problem that would  prevent you from having lung cancer treatment.  Colorectal Cancer  This type of cancer can be detected and can often be prevented.  Routine colorectal cancer screening usually begins at age 54 and continues through age 57.  If you have risk factors for colon cancer, your health care provider may recommend that you be screened at an earlier age.  If you have a family history of colorectal cancer, talk with your health care provider about genetic screening.  Your health care provider may also recommend using home test kits to check for hidden blood in your stool.  A small camera at the end of a tube can be used to examine your colon directly  (sigmoidoscopy or colonoscopy). This is done to check for the earliest forms of colorectal cancer.  Direct examination of the colon should be repeated every 5-10 years until age 81. However, if early forms of precancerous polyps or small growths are found or if you have a family history or genetic risk for colorectal cancer, you may need to be screened more often.  Skin Cancer  Check your skin from head to toe regularly.  Monitor any moles. Be sure to tell your health care provider: ? About any new moles or changes in moles, especially if there is a change in a mole's shape or color. ? If you have a mole that is larger than the size of a pencil eraser.  If any of your family members has a history of skin cancer, especially at a young age, talk with your health care provider about genetic screening.  Always use sunscreen. Apply sunscreen liberally and repeatedly throughout the day.  Whenever you are outside, protect yourself by wearing long sleeves, pants, a wide-brimmed hat, and sunglasses.  What should I know about osteoporosis? Osteoporosis is a condition in which bone destruction happens more quickly than new bone creation. After menopause, you may be at an increased risk for osteoporosis. To help prevent osteoporosis or the bone fractures that can happen because of osteoporosis, the following is recommended:  If you are 62-75 years old, get at least 1,000 mg of calcium and at least 600 mg of vitamin D per day.  If you are older than age 4 but younger than age 58, get at least 1,200 mg of calcium and at least 600 mg of vitamin D per day.  If you are older than age 67, get at least 1,200 mg of calcium and at least 800 mg of vitamin D per day.  Smoking and excessive alcohol intake increase the risk of osteoporosis. Eat foods that are rich in calcium and vitamin D, and do weight-bearing exercises several times each week as directed by your health care provider. What should I know about  how menopause affects my mental health? Depression may occur at any age, but it is more common as you become older. Common symptoms of depression include:  Low or sad mood.  Changes in sleep patterns.  Changes in appetite or eating patterns.  Feeling an overall lack of motivation or enjoyment of activities that you previously enjoyed.  Frequent crying spells.  Talk with your health care provider if you think that you are experiencing depression. What should I know about immunizations? It is important that you get and maintain your immunizations. These include:  Tetanus, diphtheria, and pertussis (Tdap) booster vaccine.  Influenza every year before the flu season begins.  Pneumonia vaccine.  Shingles vaccine.  Your health care provider may also recommend other immunizations. This  information is not intended to replace advice given to you by your health care provider. Make sure you discuss any questions you have with your health care provider. Document Released: 03/26/2005 Document Revised: 08/22/2015 Document Reviewed: 11/05/2014 Elsevier Interactive Patient Education  2018 [employer].

## 2017-01-13 NOTE — Progress Notes (Signed)
Patient ID: Rita Taylor, female    DOB: 1941/01/19  Age: 76 y.o. MRN: [id number]  The patient is here for follow up and  management of other chronic and acute problems including hypertension and hyperlipidemia.   DEXA 2017,  T SCORE -2.3  . History of osteoporosis improved with use of alendronate  Stopped due to facial swelling afer 2.5 years.  now taking prolia last dose July 2018 Colonoscopy 2015 normal,  5 yr follow up [facility] Mammogram jan 2017. implants  Walking daily  For exercise   The risk factors are reflected in the social history.  The roster of all physicians providing medical care to patient - is listed in the Snapshot section of the chart.  Activities of daily living:  The patient is 100% independent in all ADLs: dressing, toileting, feeding as well as independent mobility  Home safety : The patient has smoke detectors in the home. They wear seatbelts.  There are no firearms at home. There is no violence in the home.   There is no risks for hepatitis, STDs or HIV. There is no   history of blood transfusion. They have no travel history to infectious disease endemic areas of the world.  The patient has seen their dentist in the last six month. They have seen their eye doctor in the last year.   They do not  have excessive sun exposure. Discussed the need for sun protection: hats, long sleeves and use of sunscreen if there is significant sun exposure.   Diet: the importance of a healthy diet is discussed. They do have a healthy diet.  The benefits of regular aerobic exercise were discussed. She walks 4 times per week ,  20 minutes.   Depression screen: there are no signs or vegative symptoms of depression- irritability, change in appetite, anhedonia, sadness/tearfullness.  Cognitive assessment: the patient manages all their financial and personal affairs and is actively engaged. They could relate day,date,year and events; recalled 2/3 objects at 3 minutes; performed  clock-face test normally.  The following portions of the patient's history were reviewed and updated as appropriate: allergies, current medications, past family history, past medical history,  past surgical history, past social history  and problem list.  Visual acuity was not assessed per patient preference since she has regular follow up with her ophthalmologist. Hearing and body mass index were assessed and reviewed.   During the course of the visit the patient was educated and counseled about appropriate screening and preventive services including : fall prevention , diabetes screening, nutrition counseling, colorectal cancer screening, and recommended immunizations.    CC: The primary encounter diagnosis was Essential hypertension. Diagnoses of Pure hypercholesterolemia, Osteoporosis, postmenopausal, Breast cancer screening by mammogram, and Breast cancer screening were also pertinent to this visit.  Hypertension: patient checks blood pressure twice weekly at home.  Readings have been for the most part < 140/80 at rest . Patient is following a reduced salt diet and is taking medications as prescribed.  History Laiyah has a past medical history of Hypertension and Melanoma ([place]) (2008).   She has a past surgical history that includes Gynecologic cryosurgery; Breast enhancement surgery (1990); and bitubal ligation (1981).   Her family history includes Hypertension in her brother, father, and mother.She reports that  has never smoked. she has never used smokeless tobacco. She reports that she drinks alcohol. She reports that she does not use drugs.  Outpatient Medications Prior to Visit  Medication Sig Dispense Refill  . amLODipine (NORVASC) 2.5  MG tablet take 1 tablet by mouth once daily 30 tablet 5  . aspirin 81 MG tablet Take 81 mg by mouth daily.      . Calcium Carbonate-Vit D-Min (CALCIUM 1200) 1200-1000 MG-UNIT CHEW Chew 1 tablet by mouth daily.      Marland Kitchen losartan (COZAAR) 100 MG tablet  take 1 tablet by mouth once daily 90 tablet 1  . Omega-3 Fatty Acids (FISH OIL) 1000 MG CAPS Take 1 capsule by mouth 2 (two) times daily.    . pravastatin (PRAVACHOL) 20 MG tablet take 1 tablet by mouth once daily 90 tablet 1  . Turmeric 500 MG CAPS Take 1 capsule by mouth 2 (two) times daily.    . Red Yeast Rice 600 MG CAPS Take 1 capsule (600 mg total) by mouth 2 (two) times daily. (Patient not taking: Reported on 01/13/2017) 60 capsule 11   No facility-administered medications prior to visit.     Review of Systems   Patient denies headache, fevers, malaise, unintentional weight loss, skin rash, eye pain, sinus congestion and sinus pain, sore throat, dysphagia,  hemoptysis , cough, dyspnea, wheezing, chest pain, palpitations, orthopnea, edema, abdominal pain, nausea, melena, diarrhea, constipation, flank pain, dysuria, hematuria, urinary  Frequency, nocturia, numbness, tingling, seizures,  Focal weakness, Loss of consciousness,  Tremor, insomnia, depression, anxiety, and suicidal ideation.     Objective:  BP 140/68 (BP Location: Left Arm, Patient Position: Sitting, Cuff Size: Normal)   Pulse 67   Temp 98 F (36.7 C) (Oral)   Resp 15   Ht 5' 2.75" (1.594 m)   Wt 114 lb 12.8 oz (52.1 kg)   SpO2 97%   BMI 20.50 kg/m   Physical Exam   General appearance: alert, cooperative and appears stated age Head: Normocephalic, without obvious abnormality, atraumatic Eyes: conjunctivae/corneas clear. PERRL, EOM's intact. Fundi benign. Ears: normal TM's and external ear canals both ears Nose: Nares normal. Septum midline. Mucosa normal. No drainage or sinus tenderness. Throat: lips, mucosa, and tongue normal; teeth and gums normal Neck: no adenopathy, no carotid bruit, no JVD, supple, symmetrical, trachea midline and thyroid not enlarged, symmetric, no tenderness/mass/nodules Lungs: clear to auscultation bilaterally Breasts:  Very firm implants bilaterally, normal appearance, no masses or  tenderness Heart: regular rate and rhythm, S1, S2 normal, no murmur, click, rub or gallop Abdomen: soft, non-tender; bowel sounds normal; no masses,  no organomegaly Extremities: extremities normal, atraumatic, no cyanosis or edema Pulses: 2+ and symmetric Skin: Skin color, texture, turgor normal. No rashes or lesions Neurologic: Alert and oriented X 3, normal strength and tone. Normal symmetric reflexes. Normal coordination and gait.      Assessment & Plan:   Problem List Items Addressed This Visit    Breast cancer screening    Breast exam is hindered by implants.  Annual mammogram is overdue and has been ordered.      Colon cancer screening    5 yr follow up colonoscopy is due in 2020      Hyperlipidemia    Marked imporvement with initiation of statin . She is tolerating medication and LFTS are normal   Lab Results  Component Value Date   CHOL 176 11/05/2016   HDL 79.80 11/05/2016   LDLCALC 86 11/05/2016   LDLDIRECT 137.0 01/06/2015   TRIG 52.0 11/05/2016   CHOLHDL 2 11/05/2016   Lab Results  Component Value Date   ALT 13 11/05/2016   AST 17 11/05/2016   ALKPHOS 38 (L) 11/05/2016   BILITOT 0.7 11/05/2016  Relevant Orders   Lipid Profile   Hypertension - Primary    Well controlled on current regimen by home validated readings . Renal function stable, no changes today.  Lab Results  Component Value Date   CREATININE 0.88 11/05/2016   Lab Results  Component Value Date   NA 140 11/05/2016   K 4.6 11/05/2016   CL 103 11/05/2016   CO2 31 11/05/2016         Relevant Orders   Comprehensive metabolic panel   Osteoporosis, postmenopausal    Diagnosed in 202  and treated with alendronate until she developed hives and edema and stopped the medication in 2014.   T scores improved on alendronate.  Has been tolerating Prolia for the past year.  DEXA done this year shows a SS increase since 2015.          A total of 40 minutes was spent with patient  more than half of which was spent in counseling patient on the above mentioned issues , reviewing and explaining recent labs and imaging studies done, and coordination of care.  I have discontinued Meyer Russel. Hermiz's Red Yeast Rice. I am also having her start on Zoster Vaccine Adjuvanted. Additionally, I am having her maintain her aspirin, CALCIUM 1200, Fish Oil, Turmeric, losartan, amLODipine, and pravastatin.  Meds ordered this encounter  Medications  . Zoster Vaccine Adjuvanted [facility]) injection    Sig: Inject 0.5 mLs into the muscle once for 1 dose.    Dispense:  1 each    Refill:  1    Medications Discontinued During This Encounter  Medication Reason  . Red Yeast Rice 600 MG CAPS Patient has not taken in last 30 days    Follow-up: Return in about 6 months (around 07/13/2017), or 6 MOTH FOLLOW UP [facility] , for FASTING LABS IN [facility] .   Crecencio Mc, MD

## 2017-01-16 DIAGNOSIS — Z1239 Encounter for other screening for malignant neoplasm of breast: Secondary | ICD-10-CM | POA: Insufficient documentation

## 2017-01-16 DIAGNOSIS — Z1211 Encounter for screening for malignant neoplasm of colon: Secondary | ICD-10-CM | POA: Insufficient documentation

## 2017-01-16 NOTE — Assessment & Plan Note (Addendum)
5 yr follow up colonoscopy is due in 2020

## 2017-01-16 NOTE — Assessment & Plan Note (Signed)
Diagnosed in 202  and treated with alendronate until she developed hives and edema and stopped the medication in 2014.   T scores improved on alendronate.  Has been tolerating Prolia for the past year.  DEXA done this year shows a SS increase since 2015.

## 2017-01-16 NOTE — Assessment & Plan Note (Signed)
Breast exam is hindered by implants.  Annual mammogram is overdue and has been ordered.

## 2017-01-16 NOTE — Assessment & Plan Note (Signed)
Marked imporvement with initiation of statin . She is tolerating medication and LFTS are normal   Lab Results  Component Value Date   CHOL 176 11/05/2016   HDL 79.80 11/05/2016   LDLCALC 86 11/05/2016   LDLDIRECT 137.0 01/06/2015   TRIG 52.0 11/05/2016   CHOLHDL 2 11/05/2016   Lab Results  Component Value Date   ALT 13 11/05/2016   AST 17 11/05/2016   ALKPHOS 38 (L) 11/05/2016   BILITOT 0.7 11/05/2016

## 2017-01-16 NOTE — Assessment & Plan Note (Signed)
Well controlled on current regimen by home validated readings . Renal function stable, no changes today.  Lab Results  Component Value Date   CREATININE 0.88 11/05/2016   Lab Results  Component Value Date   NA 140 11/05/2016   K 4.6 11/05/2016   CL 103 11/05/2016   CO2 31 11/05/2016

## 2017-02-01 ENCOUNTER — Other Ambulatory Visit: Payer: Self-pay | Admitting: Internal Medicine

## 2017-03-24 ENCOUNTER — Ambulatory Visit (INDEPENDENT_AMBULATORY_CARE_PROVIDER_SITE_OTHER): Payer: Medicare Other | Admitting: *Deleted

## 2017-03-24 DIAGNOSIS — M810 Age-related osteoporosis without current pathological fracture: Secondary | ICD-10-CM | POA: Diagnosis not present

## 2017-03-24 MED ORDER — DENOSUMAB 60 MG/ML ~~LOC~~ SOLN
60.0000 mg | Freq: Once | SUBCUTANEOUS | Status: AC
  Administered 2017-03-24: 60 mg via SUBCUTANEOUS

## 2017-03-24 NOTE — Progress Notes (Addendum)
Patient presented for Prolia injection to left arm [place], patient voiced no concerns or complaints during or after injection.  Reviewed.  Dr Nicki Reaper

## 2017-04-22 DIAGNOSIS — L821 Other seborrheic keratosis: Secondary | ICD-10-CM | POA: Diagnosis not present

## 2017-04-22 DIAGNOSIS — D225 Melanocytic nevi of trunk: Secondary | ICD-10-CM | POA: Diagnosis not present

## 2017-04-22 DIAGNOSIS — Z85820 Personal history of malignant melanoma of skin: Secondary | ICD-10-CM | POA: Diagnosis not present

## 2017-04-22 DIAGNOSIS — D2262 Melanocytic nevi of left upper limb, including shoulder: Secondary | ICD-10-CM | POA: Diagnosis not present

## 2017-04-29 ENCOUNTER — Telehealth: Payer: Self-pay | Admitting: Internal Medicine

## 2017-04-29 MED ORDER — AMLODIPINE BESYLATE 2.5 MG PO TABS
2.5000 mg | ORAL_TABLET | Freq: Every day | ORAL | 1 refills | Status: DC
Start: 2017-04-29 — End: 2017-10-23

## 2017-04-29 NOTE — Telephone Encounter (Signed)
Spoke with pt to let her know that we have faxed in the rx requested. Pt gave a verbal understanding.

## 2017-04-29 NOTE — Telephone Encounter (Signed)
Copied from [place] [id number]. Topic: Quick Communication - Rx Refill/Question >> Apr 29, 2017  1:55 PM Margot Ables wrote: Medication: amlodipine - pt was out but pharmacy dispensed 5 doses as ER for her - they stated they have requested RX from provider and no response Has the patient contacted their pharmacy? Yes.   Preferred Pharmacy (with phone number or street name): [id number] - [address], [place] [phone] (Phone) [phone] (Fax)

## 2017-05-04 DIAGNOSIS — S52521A Torus fracture of lower end of right radius, initial encounter for closed fracture: Secondary | ICD-10-CM | POA: Diagnosis not present

## 2017-05-05 DIAGNOSIS — S52521A Torus fracture of lower end of right radius, initial encounter for closed fracture: Secondary | ICD-10-CM | POA: Diagnosis not present

## 2017-05-09 ENCOUNTER — Ambulatory Visit: Payer: Self-pay

## 2017-05-09 ENCOUNTER — Encounter: Payer: Self-pay | Admitting: Emergency Medicine

## 2017-05-09 ENCOUNTER — Emergency Department
Admission: EM | Admit: 2017-05-09 | Discharge: 2017-05-09 | Disposition: A | Discharge status: Home / Self Care | Payer: Medicare Other | Attending: Emergency Medicine | Admitting: Emergency Medicine

## 2017-05-09 ENCOUNTER — Other Ambulatory Visit: Payer: Self-pay

## 2017-05-09 ENCOUNTER — Emergency Department: Payer: Medicare Other

## 2017-05-09 DIAGNOSIS — S299XXA Unspecified injury of thorax, initial encounter: Secondary | ICD-10-CM | POA: Diagnosis not present

## 2017-05-09 DIAGNOSIS — Z79899 Other long term (current) drug therapy: Secondary | ICD-10-CM | POA: Insufficient documentation

## 2017-05-09 DIAGNOSIS — Y812 Prosthetic and other implants, materials and accessory general- and plastic-surgery devices associated with adverse incidents: Secondary | ICD-10-CM | POA: Insufficient documentation

## 2017-05-09 DIAGNOSIS — Z85820 Personal history of malignant melanoma of skin: Secondary | ICD-10-CM | POA: Insufficient documentation

## 2017-05-09 DIAGNOSIS — T8549XA Other mechanical complication of breast prosthesis and implant, initial encounter: Secondary | ICD-10-CM | POA: Diagnosis not present

## 2017-05-09 DIAGNOSIS — I10 Essential (primary) hypertension: Secondary | ICD-10-CM | POA: Diagnosis not present

## 2017-05-09 DIAGNOSIS — Z7982 Long term (current) use of aspirin: Secondary | ICD-10-CM | POA: Insufficient documentation

## 2017-05-09 NOTE — ED Provider Notes (Signed)
[facility] Emergency Department Provider Note  ____________________________________________  Time seen: Approximately 4:04 PM  I have reviewed the triage vital signs and the nursing notes.   HISTORY  Chief Complaint Breast Problem    HPI Rita Taylor is a 77 y.o. female status post fall last week with asymmetric breast implants.  The patient reports that her dog pulled her and she fell onto her face and fractured her right arm.  She has not been bathing regularly due to her splint, but on Saturday, she did note that her left breast implant appeared to be deflated and that there was a small palpable mass on the lateral aspect of the breast.  She has no overlying swelling or bruising, no pain.  Past Medical History:  Diagnosis Date  . Hypertension   . Melanoma ([place]) 2008   removed by Koleen Nimrod, Wider excision by [facility] left flank    Patient Active Problem List   Diagnosis Date Noted  . Colon cancer screening 01/16/2017  . Breast cancer screening 01/16/2017  . 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 Hyperlipidemia 07/10/2015  . Piriformis syndrome of left side 01/27/2015  . Routine general medical examination at a health care facility 01/06/2015  . Loss of weight 07/06/2014  . 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 History of melanoma 11/08/2012  . Hypertension 11/15/2011  . Encounter for Medicare annual wellness exam 10/14/2011  . Screening for cervical cancer 10/14/2011    Past Surgical History:  Procedure Laterality Date  . bitubal ligation  1981  . BREAST ENHANCEMENT SURGERY  1990  . GYNECOLOGIC CRYOSURGERY     15 years ago, normal since then, was treated with antibiotics, Annual pap smears for the past 20 years    Current Outpatient Rx  . Order #: [id number] Class: Normal  . Order #: [id number] Class: Historical Med  . Order #: [id number] Class: Historical Med  . Order #: [id number] Class: Normal  . Order #: [id number] Class:  Historical Med  . Order #: [id number] Class: Normal  . Order #: [id number] Class: Historical Med    Allergies Fosamax [alendronate]; Pseudoephedrine-dm-gg; and Risedronate sodium  Family History  Problem Relation Age of Onset  . Hypertension Mother   . Hypertension Father   . Hypertension Brother     Social History Social History   Tobacco Use  . Smoking status: Never Smoker  . Smokeless tobacco: Never Used  Substance Use Topics  . Alcohol use: Yes    Comment: moderate half a glass daily  . Drug use: No    Review of Systems Constitutional: No fever/chills.  Positive mechanical fall.  No loss of consciousness. Eyes: No visual changes. ENT: No sore throat. No congestion or rhinorrhea.  Ecchymosis to the chin without any difficulty closing her mouth, or dental injury. Cardiovascular: Denies chest pain.  Positive asymmetric breast implants denies palpitations. Respiratory: Denies shortness of breath.  No cough. Musculoskeletal: Negative for back pain.  Positive right arm fracture. Skin: Negative for rash. Neurological: Negative for headaches. No focal numbness, tingling or weakness.     ____________________________________________   PHYSICAL EXAM:  VITAL SIGNS: ED Triage Vitals  Enc Vitals Group     BP 05/09/17 1509 136/60     Pulse Rate 05/09/17 1509 84     Resp 05/09/17 1509 20     Temp 05/09/17 1509 98.5 F (36.9 C)     Temp Source 05/09/17 1509 Oral     SpO2 05/09/17 1509 100 %     Weight 05/09/17 1510 111 lb (  50.3 kg)     Height 05/09/17 1510 5' 2.75" (1.594 m)     Head Circumference --      Peak Flow --      Pain Score 05/09/17 1510 1     Pain Loc --      Pain Edu? --      Excl. in [place]? --     Constitutional: Alert and oriented.  Answers questions appropriately. Eyes: Conjunctivae are normal.  EOMI. No scleral icterus. Head: Atraumatic. Nose: No congestion/rhinnorhea. Mouth/Throat: Mucous membranes are moist.  Neck: No stridor.  Supple.    Cardiovascular: Normal rate Respiratory: Normal respiratory effort. CHEST: Patient has asymmetry with some deflation on the anterior frontal aspect of the left breast as well as a small palpable mass on the lateral aspect of the left breast.  She has no overlying erythema, ecchymosis, or skin changes. Musculoskeletal: Arm is immobilized in a splint.  Neurologic:  A&Ox3.  Speech is clear.  Face and smile are symmetric.  EOMI.  Moves all extremities well. Skin:  Skin is warm, dry and intact. No rash noted. Psychiatric: Mood and affect are normal. Speech and behavior are normal.  Normal judgement.  ____________________________________________   LABS (all labs ordered are listed, but only abnormal results are displayed)  Labs Reviewed - No data to display ____________________________________________  EKG  Not indicated ____________________________________________  RADIOLOGY  Dg Chest 2 View  Result Date: 05/09/2017 CLINICAL DATA:  Fall with abnormal appearance of the left breast, history of implant EXAM: CHEST - 2 VIEW COMPARISON:  Mammogram report 03/13/2015 FINDINGS: No acute pulmonary infiltrate or effusion. Normal cardiomediastinal silhouette with aortic atherosclerosis. No pneumothorax. Calcified bilateral breast implants. Left implant appears smaller than the right. IMPRESSION: 1. No acute osseous abnormality 2. Calcified bilateral breast implants with smaller appearing left implant of uncertain chronicity. Electronically Signed   By: Donavan Foil M.D.   On: 05/09/2017 15:43    ____________________________________________   PROCEDURES  Procedure(s) performed: None  Procedures  Critical Care performed: No ____________________________________________   INITIAL IMPRESSION / ASSESSMENT AND PLAN / ED COURSE  Pertinent labs & imaging results that were available during my care of the patient were reviewed by me and considered in my medical decision making (see chart for  details).  77 y.o. female with a remote history of bilateral breast implants presenting with asymmetry after a fall.  Overall, the patient is hemodynamically stable.  I do a C asymmetry which is concerning for not displacement but rather leakage of her implant.  Her chest x-ray does confirm asymmetry.  Unfortunately, there is no Psychiatric nurse on call either at [facility] ER in the [place] system.  I spoke with Dr. Towanda Malkin, and [facility], who can see the patient for outpatient evaluation.  At this time, the patient is safe for discharge home.  Follow-up instructions as well as return precautions were discussed.  ____________________________________________  FINAL CLINICAL IMPRESSION(S) / ED DIAGNOSES  Final diagnoses:  Breast implant deflation, initial encounter         NEW MEDICATIONS STARTED DURING THIS VISIT:  New Prescriptions   No medications on file      Eula Listen, MD 05/09/17 1615

## 2017-05-09 NOTE — ED Notes (Signed)
ED Provider at bedside. 

## 2017-05-09 NOTE — Telephone Encounter (Signed)
fyi

## 2017-05-09 NOTE — Telephone Encounter (Signed)
Spoke with pt and she stated that she is going to the ED as soon as she gets someone to take her.

## 2017-05-09 NOTE — Discharge Instructions (Signed)
Please return to the emergency department for severe pain, shortness of breath, fever, or any other symptoms concerning to you.

## 2017-05-09 NOTE — Telephone Encounter (Signed)
Pt states that fell last Wednesday and broke her wrist.Pt has bilateral breasts implants. She noted after the fall that her left breast is flatter across the top of her left breast and is not as "perkier" than the right breast. No brusing noted but noted slight "pink" area to top of left breast. Called office and spoke to [place] regarding pt complaint. Patina recommended sending pt to the ED for evaluation.. Pt informed of ED. Disposition. Pt wanting to know if can get xray at a Breast Center. Advised pt to be evaluated at the ED. Reason for Disposition . [1] Breast implants AND [2] pain, asymmetry, or other concerns  Answer Assessment - Initial Assessment Questions 1. SYMPTOM: "What's the main symptom you're concerned about?"  (e.g., lump, pain, rash, nipple discharge)     Left breast after fall is flatter in across the top of breast, and is not as "perky" as the right breast 2. LOCATION: "Where is the _______ located?" Changes- across the top 3. ONSET: "When did ________  start?"     Last Wednesday  4. PRIOR HISTORY: "Do you have any history of prior problems with your breasts?" (e.g., lumps, cancer, fibrocystic breast disease)     Is an implant 5. CAUSE: "What do you think is causing this symptom?"     fall 6. OTHER SYMPTOMS: "Do you have any other symptoms?" (e.g., fever, breast pain, redness or rash, nipple discharge)    H/o  Soreness at ribs 7. PREGNANCY-BREASTFEEDING: "Is there any chance you are pregnant?" "When was your last menstrual period?" "Are you breastfeeding?"     n/a  Protocols used: BREAST [facility]

## 2017-05-09 NOTE — ED Triage Notes (Signed)
States fell Wednesday 5 days ago. States 2 days ago noted L breast, has implant, looks different than prior to fall.

## 2017-05-09 NOTE — ED Notes (Signed)
Patient transported to X-ray 

## 2017-05-10 ENCOUNTER — Telehealth: Payer: Self-pay

## 2017-05-10 DIAGNOSIS — T8549XA Other mechanical complication of breast prosthesis and implant, initial encounter: Secondary | ICD-10-CM

## 2017-05-10 NOTE — Telephone Encounter (Signed)
I will ask Rita Taylor to check with Dr Marlowe Aschoff office to see if they can supply [place] with a referral name for a plastic surgeon .  I do not know of any plastic surgeons who can see her for a possible traumatic rupture of her breast implant

## 2017-05-10 NOTE — Telephone Encounter (Signed)
Copied from [place] [id number]. Topic: Referral - Request >> May 10, 2017 10:15 AM Burnis Medin, NT wrote: Reason for CRM: Patient called and said she had a fall and she thinks she may have damaged her breast implant. Pt wanted to see if the doctor could refer her to a plastic surgeon that specialist in breast. Pt would like a call back.

## 2017-05-12 DIAGNOSIS — S52521A Torus fracture of lower end of right radius, initial encounter for closed fracture: Secondary | ICD-10-CM | POA: Diagnosis not present

## 2017-05-13 ENCOUNTER — Other Ambulatory Visit (INDEPENDENT_AMBULATORY_CARE_PROVIDER_SITE_OTHER): Payer: Medicare Other

## 2017-05-13 DIAGNOSIS — E7800 Pure hypercholesterolemia, unspecified: Secondary | ICD-10-CM

## 2017-05-13 DIAGNOSIS — I10 Essential (primary) hypertension: Secondary | ICD-10-CM | POA: Diagnosis not present

## 2017-05-13 LAB — COMPREHENSIVE METABOLIC PANEL
ALK PHOS: 57 U/L (ref 39–117)
ALT: 35 U/L (ref 0–35)
AST: 31 U/L (ref 0–37)
Albumin: 4 g/dL (ref 3.5–5.2)
BILIRUBIN TOTAL: 0.6 mg/dL (ref 0.2–1.2)
BUN: 20 mg/dL (ref 6–23)
CALCIUM: 9.3 mg/dL (ref 8.4–10.5)
CO2: 29 meq/L (ref 19–32)
CREATININE: 0.77 mg/dL (ref 0.40–1.20)
Chloride: 101 mEq/L (ref 96–112)
GFR: 77.16 mL/min (ref >60.00)
GLUCOSE: 108 mg/dL — AB (ref 70–99)
Potassium: 4.1 mEq/L (ref 3.5–5.1)
Sodium: 139 mEq/L (ref 135–145)
TOTAL PROTEIN: 6.7 g/dL (ref 6.0–8.3)

## 2017-05-13 LAB — LIPID PANEL
CHOL/HDL RATIO: 2
Cholesterol: 159 mg/dL (ref 0–200)
HDL: 68.2 mg/dL (ref >39.00)
LDL Cholesterol: 74 mg/dL (ref 0–99)
NonHDL: 90.52
TRIGLYCERIDES: 81 mg/dL (ref 0.0–149.0)
VLDL: 16.2 mg/dL (ref 0.0–40.0)

## 2017-05-16 ENCOUNTER — Telehealth: Payer: Self-pay | Admitting: Internal Medicine

## 2017-05-16 NOTE — Telephone Encounter (Addendum)
Thank you,  I have made the referral to Audelia Hives .  She is a Psychiatric nurse in the [place] area  That can replace or remove her implant

## 2017-05-16 NOTE — Telephone Encounter (Signed)
A lot of providers/patients use Audelia Hives. She has an office in [place].

## 2017-05-16 NOTE — Addendum Note (Signed)
Addended by: Crecencio Mc on: 05/16/2017 12:39 PM   Modules accepted: Orders

## 2017-05-16 NOTE — Telephone Encounter (Signed)
Copied from [place] [id number]. Topic: Quick Communication - See Telephone Encounter >> May 16, 2017  2:17 PM Robina Ade, Helene Kelp D wrote: CRM for notification. See Telephone encounter for: 05/16/17. Patient called and would like her last lab results to be mailed to her please, thank you.

## 2017-05-16 NOTE — Telephone Encounter (Signed)
Mailed by [employer]

## 2017-05-19 ENCOUNTER — Encounter: Payer: Self-pay | Admitting: Internal Medicine

## 2017-05-19 DIAGNOSIS — S52521A Torus fracture of lower end of right radius, initial encounter for closed fracture: Secondary | ICD-10-CM | POA: Diagnosis not present

## 2017-05-26 DIAGNOSIS — S52521D Torus fracture of lower end of right radius, subsequent encounter for fracture with routine healing: Secondary | ICD-10-CM | POA: Diagnosis not present

## 2017-06-01 DIAGNOSIS — M7062 Trochanteric bursitis, left hip: Secondary | ICD-10-CM | POA: Diagnosis not present

## 2017-06-01 DIAGNOSIS — G5702 Lesion of sciatic nerve, left lower limb: Secondary | ICD-10-CM | POA: Diagnosis not present

## 2017-06-10 DIAGNOSIS — T8543XA Leakage of breast prosthesis and implant, initial encounter: Secondary | ICD-10-CM | POA: Insufficient documentation

## 2017-06-10 DIAGNOSIS — T859XXA Unspecified complication of internal prosthetic device, implant and graft, initial encounter: Secondary | ICD-10-CM | POA: Insufficient documentation

## 2017-06-10 DIAGNOSIS — T8549XA Other mechanical complication of breast prosthesis and implant, initial encounter: Secondary | ICD-10-CM | POA: Diagnosis not present

## 2017-06-16 DIAGNOSIS — S52521A Torus fracture of lower end of right radius, initial encounter for closed fracture: Secondary | ICD-10-CM | POA: Diagnosis not present

## 2017-06-23 DIAGNOSIS — S52531D Colles' fracture of right radius, subsequent encounter for closed fracture with routine healing: Secondary | ICD-10-CM | POA: Diagnosis not present

## 2017-07-07 DIAGNOSIS — S52531D Colles' fracture of right radius, subsequent encounter for closed fracture with routine healing: Secondary | ICD-10-CM | POA: Diagnosis not present

## 2017-07-12 ENCOUNTER — Other Ambulatory Visit: Payer: Self-pay | Admitting: Internal Medicine

## 2017-07-13 ENCOUNTER — Encounter: Payer: Self-pay | Admitting: Internal Medicine

## 2017-07-13 ENCOUNTER — Ambulatory Visit (INDEPENDENT_AMBULATORY_CARE_PROVIDER_SITE_OTHER): Payer: Medicare Other | Admitting: Internal Medicine

## 2017-07-13 VITALS — BP 124/70 | HR 67 | Temp 97.7°F | Resp 14 | Ht 62.75 in | Wt 111.0 lb

## 2017-07-13 DIAGNOSIS — R7301 Impaired fasting glucose: Secondary | ICD-10-CM | POA: Diagnosis not present

## 2017-07-13 DIAGNOSIS — E785 Hyperlipidemia, unspecified: Secondary | ICD-10-CM | POA: Diagnosis not present

## 2017-07-13 DIAGNOSIS — E559 Vitamin D deficiency, unspecified: Secondary | ICD-10-CM

## 2017-07-13 DIAGNOSIS — S52571S Other intraarticular fracture of lower end of right radius, sequela: Secondary | ICD-10-CM

## 2017-07-13 DIAGNOSIS — I10 Essential (primary) hypertension: Secondary | ICD-10-CM | POA: Diagnosis not present

## 2017-07-13 DIAGNOSIS — E7800 Pure hypercholesterolemia, unspecified: Secondary | ICD-10-CM | POA: Diagnosis not present

## 2017-07-13 DIAGNOSIS — R634 Abnormal weight loss: Secondary | ICD-10-CM

## 2017-07-13 DIAGNOSIS — M810 Age-related osteoporosis without current pathological fracture: Secondary | ICD-10-CM

## 2017-07-13 MED ORDER — PRAVASTATIN SODIUM 20 MG PO TABS: 20.0000 mg | ORAL_TABLET | Freq: Every day | ORAL | 1 refills | Status: DC

## 2017-07-13 NOTE — Patient Instructions (Signed)
You are due for Prolia injection in August  You ar edue for fasting labs in September  You can stop your aspirin  RTC to see me in late early December

## 2017-07-13 NOTE — Progress Notes (Signed)
Subjective:  Patient ID: Rockne Coons, female    DOB: 1940-11-19  Age: 77 y.o. MRN: [id number]   CC: The primary encounter diagnosis was Impaired fasting blood sugar. Diagnoses of Weight loss, unintentional, Vitamin D deficiency, Hyperlipidemia LDL goal <130, Loss of weight, Pure hypercholesterolemia, Essential hypertension, Osteoporosis, postmenopausal, and Other closed intra-articular fracture of distal end of right radius, sequela were also pertinent to this visit.  HPI RYLI STANDLEE presents for 6 month follow up on hypertension and hyperlipidemia  Since her last visit in November fractured her right wrist during a fall onto outstretched hand .  This occurred on March  20  Went to [facility] .  Getting PT , going well .  Was in a hard cast for 6 weeks, now in a removable brace   During the fall her Left  Breast implant also was ruptured. She saw a Psychiatric nurse who recommended removal of silicone implants . patient has decided to defer surgery    And is aware of the slight increased risk of an inflammatory reaction if she leaves the  silicone in place.   Osteo:  ast Prolia injection was  In February .  Last DEXA  2017  Cc:  Loss of eyebrows ,  3 lb unintentional weight loss,  Still grieving the loss of a dear friend       Outpatient Medications Prior to Visit  Medication Sig Dispense Refill  . amLODipine (NORVASC) 2.5 MG tablet Take 1 tablet (2.5 mg total) by mouth daily. 90 tablet 1  . Calcium Carbonate-Vit D-Min (CALCIUM 1200) 1200-1000 MG-UNIT CHEW Chew 1 tablet by mouth daily.      Marland Kitchen losartan (COZAAR) 100 MG tablet take 1 tablet by mouth once daily 90 tablet 1  . Omega-3 Fatty Acids (FISH OIL) 1000 MG CAPS Take 1 capsule by mouth 2 (two) times daily.    . Turmeric 500 MG CAPS Take 1 capsule by mouth 2 (two) times daily.    . pravastatin (PRAVACHOL) 20 MG tablet take 1 tablet by mouth once daily 90 tablet 1  . aspirin 81 MG tablet Take 81 mg by mouth daily.       No  facility-administered medications prior to visit.     Review of Systems;  Patient denies headache, fevers, malaise, unintentional weight loss, skin rash, eye pain, sinus congestion and sinus pain, sore throat, dysphagia,  hemoptysis , cough, dyspnea, wheezing, chest pain, palpitations, orthopnea, edema, abdominal pain, nausea, melena, diarrhea, constipation, flank pain, dysuria, hematuria, urinary  Frequency, nocturia, numbness, tingling, seizures,  Focal weakness, Loss of consciousness,  Tremor, insomnia, depression, anxiety, and suicidal ideation.      Objective:  BP 124/70 (BP Location: Left Arm, Patient Position: Sitting, Cuff Size: Normal)   Pulse 67   Temp 97.7 F (36.5 C) (Oral)   Resp 14   Ht 5' 2.75" (1.594 m)   Wt 111 lb (50.3 kg)   SpO2 96%   BMI 19.82 kg/m   BP Readings from Last 3 Encounters:  07/13/17 124/70  05/09/17 (!) 146/76  01/13/17 140/68    Wt Readings from Last 3 Encounters:  07/13/17 111 lb (50.3 kg)  05/09/17 111 lb (50.3 kg)  01/13/17 114 lb 12.8 oz (52.1 kg)    General appearance: alert, cooperative and appears stated age Ears: normal TM's and external ear canals both ears Throat: lips, mucosa, and tongue normal; teeth and gums normal Neck: no adenopathy, no carotid bruit, supple, symmetrical, trachea midline and thyroid  not enlarged, symmetric, no tenderness/mass/nodules Back: symmetric, no curvature. ROM normal. No CVA tenderness. Lungs: clear to auscultation bilaterally Heart: regular rate and rhythm, S1, S2 normal, no murmur, click, rub or gallop Abdomen: soft, non-tender; bowel sounds normal; no masses,  no organomegaly Pulses: 2+ and symmetric Skin: Skin color, texture, turgor normal. No rashes or lesions MSK: right wrist in immobilized in cockup splint  Lymph nodes: Cervical, supraclavicular, and axillary nodes normal.  No results found for: HGBA1C  Lab Results  Component Value Date   CREATININE 0.77 05/13/2017   CREATININE 0.88  11/05/2016   CREATININE 0.94 07/08/2016    Lab Results  Component Value Date   WBC 6.5 01/05/2016   HGB 12.5 01/05/2016   HCT 36.9 01/05/2016   PLT 244.0 01/05/2016   GLUCOSE 108 (H) 05/13/2017   CHOL 159 05/13/2017   TRIG 81.0 05/13/2017   HDL 68.20 05/13/2017   LDLDIRECT 137.0 01/06/2015   LDLCALC 74 05/13/2017   ALT 35 05/13/2017   AST 31 05/13/2017   NA 139 05/13/2017   K 4.1 05/13/2017   CL 101 05/13/2017   CREATININE 0.77 05/13/2017   BUN 20 05/13/2017   CO2 29 05/13/2017   TSH 1.06 01/05/2016   MICROALBUR 0.1 01/26/2012    Dg Chest 2 View  Result Date: 05/09/2017 CLINICAL DATA:  Fall with abnormal appearance of the left breast, history of implant EXAM: CHEST - 2 VIEW COMPARISON:  Mammogram report 03/13/2015 FINDINGS: No acute pulmonary infiltrate or effusion. Normal cardiomediastinal silhouette with aortic atherosclerosis. No pneumothorax. Calcified bilateral breast implants. Left implant appears smaller than the right. IMPRESSION: 1. No acute osseous abnormality 2. Calcified bilateral breast implants with smaller appearing left implant of uncertain chronicity. Electronically Signed   By: Donavan Foil M.D.   On: 05/09/2017 15:43    Assessment & Plan:   Problem List Items Addressed This Visit    Osteoporosis, postmenopausal    Diagnosed in 2012  and treated with alendronate until she developed hives and edema and stopped the medication in 2014.   T scores improved on alendronate.  Has been tolerating Prolia for the past year.  DEXA done in 2017 year shows a SS increase since 2015.  Next dose due in August         Loss of weight    Screening labs ordered   Grief playing a role.  I have reviewed her diet and recommended that she increase her protein and fat intake while monitoring her carbohydrates.  Lab Results  Component Value Date   TSH 1.06 01/05/2016         Hypertension    Well controlled on current regimen by home validated readings . Renal function  stable, no changes today.  Lab Results  Component Value Date   CREATININE 0.77 05/13/2017   Lab Results  Component Value Date   NA 139 05/13/2017   K 4.1 05/13/2017   CL 101 05/13/2017   CO2 29 05/13/2017         Relevant Medications   pravastatin (PRAVACHOL) 20 MG tablet   Hyperlipidemia    Marked imporvement with initiation of statin . She is tolerating medication and LFTS are normal   ASA stopped after discussion of recent ASPREE trial and dicussion of risks and benefits.   Lab Results  Component Value Date   CHOL 159 05/13/2017   HDL 68.20 05/13/2017   LDLCALC 74 05/13/2017   LDLDIRECT 137.0 01/06/2015   TRIG 81.0 05/13/2017   CHOLHDL 2 05/13/2017  Lab Results  Component Value Date   ALT 35 05/13/2017   AST 31 05/13/2017   ALKPHOS 57 05/13/2017   BILITOT 0.6 05/13/2017         Relevant Medications   pravastatin (PRAVACHOL) 20 MG tablet   Distal radial fracture    Secondary to fall /  Managed with hard cast  For 6 weeks  Followed by removable cock up splint  for 6 more weeks per [facility]        Other Visit Diagnoses    Impaired fasting blood sugar    -  Primary   Relevant Orders   Hemoglobin A1c   Weight loss, unintentional       Relevant Orders   TSH   Comprehensive metabolic panel   Vitamin D deficiency       Relevant Orders   VITAMIN D 25 Hydroxy (Vit-D Deficiency, Fractures)   Hyperlipidemia LDL goal <130       Relevant Medications   pravastatin (PRAVACHOL) 20 MG tablet   Other Relevant Orders   Lipid panel     A total of 25 minutes of face to face time was spent with patient more than half of which was spent in counselling about the above mentioned conditions  and coordination of care   I have discontinued Meyer Russel. Koepke's aspirin. I have also changed her pravastatin. Additionally, I am having her maintain her CALCIUM 1200, Fish Oil, Turmeric, losartan, and amLODipine.  Meds ordered this encounter  Medications  . pravastatin  (PRAVACHOL) 20 MG tablet    Sig: Take 1 tablet (20 mg total) by mouth daily.    Dispense:  90 tablet    Refill:  1    Medications Discontinued During This Encounter  Medication Reason  . pravastatin (PRAVACHOL) 20 MG tablet Reorder  . aspirin 81 MG tablet     Follow-up: Return in about 6 months (around 01/13/2018).   Crecencio Mc, MD

## 2017-07-16 DIAGNOSIS — S52509A Unspecified fracture of the lower end of unspecified radius, initial encounter for closed fracture: Secondary | ICD-10-CM | POA: Insufficient documentation

## 2017-07-16 NOTE — Assessment & Plan Note (Addendum)
Secondary to fall /  Managed with hard cast  For 6 weeks  Followed by removable cock up splint  for 6 more weeks per [facility]

## 2017-07-16 NOTE — Assessment & Plan Note (Signed)
Marked imporvement with initiation of statin . She is tolerating medication and LFTS are normal   ASA stopped after discussion of recent ASPREE trial and dicussion of risks and benefits.   Lab Results  Component Value Date   CHOL 159 05/13/2017   HDL 68.20 05/13/2017   LDLCALC 74 05/13/2017   LDLDIRECT 137.0 01/06/2015   TRIG 81.0 05/13/2017   CHOLHDL 2 05/13/2017   Lab Results  Component Value Date   ALT 35 05/13/2017   AST 31 05/13/2017   ALKPHOS 57 05/13/2017   BILITOT 0.6 05/13/2017

## 2017-07-16 NOTE — Assessment & Plan Note (Signed)
Diagnosed in 2012  and treated with alendronate until she developed hives and edema and stopped the medication in 2014.   T scores improved on alendronate.  Has been tolerating Prolia for the past year.  DEXA done in 2017 year shows a SS increase since 2015.  Next dose due in August

## 2017-07-16 NOTE — Assessment & Plan Note (Signed)
Screening labs ordered   Grief playing a role.  I have reviewed her diet and recommended that she increase her protein and fat intake while monitoring her carbohydrates.  Lab Results  Component Value Date   TSH 1.06 01/05/2016

## 2017-07-16 NOTE — Assessment & Plan Note (Signed)
Well controlled on current regimen by home validated readings . Renal function stable, no changes today.  Lab Results  Component Value Date   CREATININE 0.77 05/13/2017   Lab Results  Component Value Date   NA 139 05/13/2017   K 4.1 05/13/2017   CL 101 05/13/2017   CO2 29 05/13/2017

## 2017-07-21 DIAGNOSIS — S52531D Colles' fracture of right radius, subsequent encounter for closed fracture with routine healing: Secondary | ICD-10-CM | POA: Diagnosis not present

## 2017-08-08 ENCOUNTER — Telehealth: Payer: Self-pay | Admitting: Internal Medicine

## 2017-08-08 MED ORDER — LOSARTAN POTASSIUM 100 MG PO TABS: 100.0000 mg | ORAL_TABLET | Freq: Every day | ORAL | 1 refills | Status: DC

## 2017-08-08 NOTE — Telephone Encounter (Signed)
Refill request for Cozaar; last refill 02/01/17; #90; RF x 1  Last office visit 07/13/17  PCP Dr. Evlyn Clines. [facility] in [place], [place], [place] refilled per protocol

## 2017-08-08 NOTE — Telephone Encounter (Signed)
Copied from [phone] [phone] [phone]. Topic: Quick Communication - Rx Refill/Question >> Aug 08, 2017  9:31 AM Rita Taylor, NT wrote: Medication: losartan (COZAAR) 100 MG tablet   Has the patient contacted their pharmacy? Yes.   (Agent: If no, request that the patient contact the pharmacy for the refill.) (Agent: If yes, when and what did the pharmacy advise?)  Preferred Pharmacy (with phone number or street name): [id number] - [place], [place] - [place] [phone] (Phone) [phone] (Fax)      Agent: Please be advised that RX refills may take up to 3 business days. We ask that you follow-up with your pharmacy.

## 2017-08-19 DIAGNOSIS — S52531D Colles' fracture of right radius, subsequent encounter for closed fracture with routine healing: Secondary | ICD-10-CM | POA: Diagnosis not present

## 2017-08-23 DIAGNOSIS — S52531D Colles' fracture of right radius, subsequent encounter for closed fracture with routine healing: Secondary | ICD-10-CM | POA: Diagnosis not present

## 2017-09-12 ENCOUNTER — Telehealth: Payer: Self-pay | Admitting: Internal Medicine

## 2017-09-12 NOTE — Telephone Encounter (Signed)
Pharmacist, community for [facility] on [address] verification from [place] ,called to schedule injection on or after 09/21/17 no answer left message to call office to schedule nurse visit . [facility] may notify patient and schedule nurse  Visit on or after 09/21/17.

## 2017-09-13 NOTE — Telephone Encounter (Signed)
Patient scheduled.

## 2017-09-19 NOTE — Progress Notes (Signed)
Scheduled

## 2017-09-20 NOTE — Progress Notes (Signed)
Patient approved and scheduled for Prolia injection 09/22/17

## 2017-09-22 ENCOUNTER — Ambulatory Visit (INDEPENDENT_AMBULATORY_CARE_PROVIDER_SITE_OTHER): Payer: Medicare Other

## 2017-09-22 DIAGNOSIS — M810 Age-related osteoporosis without current pathological fracture: Secondary | ICD-10-CM | POA: Diagnosis not present

## 2017-09-22 MED ORDER — DENOSUMAB 60 MG/ML ~~LOC~~ SOSY
60.0000 mg | PREFILLED_SYRINGE | Freq: Once | SUBCUTANEOUS | Status: AC
  Administered 2017-09-22: 60 mg via SUBCUTANEOUS

## 2017-09-22 NOTE — Progress Notes (Addendum)
Patient comes in today for a Prolia injection. Administered in left arm SQ. Patient tolerated well.  Reviewed.  Dr Nicki Reaper

## 2017-10-19 ENCOUNTER — Other Ambulatory Visit (INDEPENDENT_AMBULATORY_CARE_PROVIDER_SITE_OTHER): Payer: Medicare Other

## 2017-10-19 DIAGNOSIS — E559 Vitamin D deficiency, unspecified: Secondary | ICD-10-CM

## 2017-10-19 DIAGNOSIS — R634 Abnormal weight loss: Secondary | ICD-10-CM

## 2017-10-19 DIAGNOSIS — E785 Hyperlipidemia, unspecified: Secondary | ICD-10-CM | POA: Diagnosis not present

## 2017-10-19 DIAGNOSIS — R7301 Impaired fasting glucose: Secondary | ICD-10-CM

## 2017-10-19 LAB — COMPREHENSIVE METABOLIC PANEL
ALBUMIN: 4.4 g/dL (ref 3.5–5.2)
ALK PHOS: 33 U/L — AB (ref 39–117)
ALT: 12 U/L (ref 0–35)
AST: 16 U/L (ref 0–37)
BUN: 19 mg/dL (ref 6–23)
CO2: 31 mEq/L (ref 19–32)
Calcium: 9.5 mg/dL (ref 8.4–10.5)
Chloride: 103 mEq/L (ref 96–112)
Creatinine, Ser: 0.85 mg/dL (ref 0.40–1.20)
GFR: 68.76 mL/min (ref >60.00)
Glucose, Bld: 99 mg/dL (ref 70–99)
POTASSIUM: 4.5 meq/L (ref 3.5–5.1)
SODIUM: 141 meq/L (ref 135–145)
TOTAL PROTEIN: 6.7 g/dL (ref 6.0–8.3)
Total Bilirubin: 0.7 mg/dL (ref 0.2–1.2)

## 2017-10-19 LAB — LIPID PANEL
CHOLESTEROL: 170 mg/dL (ref 0–200)
HDL: 77.6 mg/dL (ref >39.00)
LDL Cholesterol: 83 mg/dL (ref 0–99)
NonHDL: 92.53
Total CHOL/HDL Ratio: 2
Triglycerides: 48 mg/dL (ref 0.0–149.0)
VLDL: 9.6 mg/dL (ref 0.0–40.0)

## 2017-10-19 LAB — VITAMIN D 25 HYDROXY (VIT D DEFICIENCY, FRACTURES): VITD: 43.98 ng/mL (ref 30.00–100.00)

## 2017-10-19 LAB — HEMOGLOBIN A1C: Hgb A1c MFr Bld: 6.4 % (ref 4.6–6.5)

## 2017-10-19 LAB — TSH: TSH: 1.18 u[IU]/mL (ref 0.35–4.50)

## 2017-10-23 ENCOUNTER — Other Ambulatory Visit: Payer: Self-pay | Admitting: Internal Medicine

## 2017-10-26 ENCOUNTER — Telehealth: Payer: Self-pay | Admitting: *Deleted

## 2017-10-26 NOTE — Telephone Encounter (Signed)
Pt called with concerns after reviewing lab results that were mailed to her home. Would like to hear from Dr. Derrel Nip re: A1c range "Pre-diabetes." Pt anxious and questioning if there is anything she should be doing or taking. Would like to have a conversation addressing this.   Please advise: [phone]

## 2017-10-26 NOTE — Telephone Encounter (Signed)
Requesting a call back to discuss labs

## 2017-10-26 NOTE — Telephone Encounter (Signed)
Spoke with about her labs. Pt was concerned about her A1C level now being in the pre-diabetes range and she was wanting to know what she could do differently to help bring the number back down. Gave the pt some advise on diet and told her we would send her a copy of the low glycemic index diet for her to follow. Pt gave a verbal understanding. Diet has been placed in the mail.

## 2017-11-03 DIAGNOSIS — L821 Other seborrheic keratosis: Secondary | ICD-10-CM | POA: Diagnosis not present

## 2017-12-07 DIAGNOSIS — Z23 Encounter for immunization: Secondary | ICD-10-CM | POA: Diagnosis not present

## 2018-01-10 ENCOUNTER — Other Ambulatory Visit: Payer: Self-pay | Admitting: Internal Medicine

## 2018-01-17 ENCOUNTER — Ambulatory Visit: Payer: Medicare Other | Admitting: Internal Medicine

## 2018-01-18 ENCOUNTER — Ambulatory Visit (INDEPENDENT_AMBULATORY_CARE_PROVIDER_SITE_OTHER): Payer: Medicare Other | Admitting: Internal Medicine

## 2018-01-18 ENCOUNTER — Encounter: Payer: Self-pay | Admitting: Internal Medicine

## 2018-01-18 VITALS — BP 148/70 | HR 73 | Temp 98.3°F | Ht 62.0 in | Wt 112.0 lb

## 2018-01-18 DIAGNOSIS — R7303 Prediabetes: Secondary | ICD-10-CM | POA: Diagnosis not present

## 2018-01-18 DIAGNOSIS — R7301 Impaired fasting glucose: Secondary | ICD-10-CM | POA: Diagnosis not present

## 2018-01-18 DIAGNOSIS — R151 Fecal smearing: Secondary | ICD-10-CM

## 2018-01-18 LAB — POCT GLYCOSYLATED HEMOGLOBIN (HGB A1C): Hemoglobin A1C: 5.4 % (ref 4.0–5.6)

## 2018-01-18 NOTE — Progress Notes (Signed)
Subjective:  Patient ID: Rita Taylor, female    DOB: 1940/05/10  Age: 77 y.o. MRN: [id number]  CC: The primary encounter diagnosis was Prediabetes. Diagnoses of Fecal soiling and Impaired fasting glucose were also pertinent to this visit.  HPI Rita Taylor presents for 6 month follow up on IPG/prediabetes.  Patient has no complaints today.  Patient is following a low glycemic index diet and exercising regularly .  She is not  intentionally trying to lose weight because her Body mass index is 20.49 kg/m.  Marland Kitchen   2) intermittent fecal incontinence patient notes that after she has a normal bowel movement,  She returns to the bathroom to empty her bladder and often has fecal soiling when she wipes.  She is continent of formed stools and denies rectal pain, rectal bleeding and diarrhea.  Her last colonoscopy was completely normal in 2015 and 5 yr follow up is due next year due to history of tubular adenomas.  Outpatient Medications Prior to Visit  Medication Sig Dispense Refill  . amLODipine (NORVASC) 2.5 MG tablet TAKE 1 TABLET(2.5 MG) BY MOUTH DAILY 90 tablet 1  . Calcium Carbonate-Vit D-Min (CALCIUM 1200) 1200-1000 MG-UNIT CHEW Chew 1 tablet by mouth daily.      Marland Kitchen losartan (COZAAR) 100 MG tablet Take 1 tablet (100 mg total) by mouth daily. 90 tablet 1  . Omega-3 Fatty Acids (FISH OIL) 1000 MG CAPS Take 1 capsule by mouth 2 (two) times daily.    . pravastatin (PRAVACHOL) 20 MG tablet TAKE 1 TABLET(20 MG) BY MOUTH DAILY 90 tablet 1  . Turmeric 500 MG CAPS Take 1 capsule by mouth 2 (two) times daily.     No facility-administered medications prior to visit.     Review of Systems;  Patient denies headache, fevers, malaise, unintentional weight loss, skin rash, eye pain, sinus congestion and sinus pain, sore throat, dysphagia,  hemoptysis , cough, dyspnea, wheezing, chest pain, palpitations, orthopnea, edema, abdominal pain, nausea, melena, diarrhea, constipation, flank pain, dysuria,  hematuria, urinary  Frequency, nocturia, numbness, tingling, seizures,  Focal weakness, Loss of consciousness,  Tremor, insomnia, depression, anxiety, and suicidal ideation.      Objective:  BP (!) 148/70   Pulse 73   Temp 98.3 F (36.8 C) (Oral)   Ht 5\' 2"  (1.575 m)   Wt 112 lb (50.8 kg)   SpO2 96%   BMI 20.49 kg/m   BP Readings from Last 3 Encounters:  01/18/18 (!) 148/70  07/13/17 124/70  05/09/17 (!) 146/76    Wt Readings from Last 3 Encounters:  01/18/18 112 lb (50.8 kg)  07/13/17 111 lb (50.3 kg)  05/09/17 111 lb (50.3 kg)    General appearance: alert, cooperative and appears stated age Ears: normal TM's and external ear canals both ears Throat: lips, mucosa, and tongue normal; teeth and gums normal Neck: no adenopathy, no carotid bruit, supple, symmetrical, trachea midline and thyroid not enlarged, symmetric, no tenderness/mass/nodules Back: symmetric, no curvature. ROM normal. No CVA tenderness. Lungs: clear to auscultation bilaterally Heart: regular rate and rhythm, S1, S2 normal, no murmur, click, rub or gallop Abdomen: soft, non-tender; bowel sounds normal; no masses,  no organomegaly Pulses: 2+ and symmetric Skin: Skin color, texture, turgor normal. No rashes or lesions Lymph nodes: Cervical, supraclavicular, and axillary nodes normal.  Lab Results  Component Value Date   HGBA1C 5.4 01/18/2018   HGBA1C 6.4 10/19/2017    Lab Results  Component Value Date   CREATININE 0.85 10/19/2017  CREATININE 0.77 05/13/2017   CREATININE 0.88 11/05/2016    Lab Results  Component Value Date   WBC 6.5 01/05/2016   HGB 12.5 01/05/2016   HCT 36.9 01/05/2016   PLT 244.0 01/05/2016   GLUCOSE 99 10/19/2017   CHOL 170 10/19/2017   TRIG 48.0 10/19/2017   HDL 77.60 10/19/2017   LDLDIRECT 137.0 01/06/2015   LDLCALC 83 10/19/2017   ALT 12 10/19/2017   AST 16 10/19/2017   NA 141 10/19/2017   K 4.5 10/19/2017   CL 103 10/19/2017   CREATININE 0.85 10/19/2017   BUN  19 10/19/2017   CO2 31 10/19/2017   TSH 1.18 10/19/2017   HGBA1C 5.4 01/18/2018   MICROALBUR 0.1 01/26/2012    Dg Chest 2 View  Result Date: 05/09/2017 CLINICAL DATA:  Fall with abnormal appearance of the left breast, history of implant EXAM: CHEST - 2 VIEW COMPARISON:  Mammogram report 03/13/2015 FINDINGS: No acute pulmonary infiltrate or effusion. Normal cardiomediastinal silhouette with aortic atherosclerosis. No pneumothorax. Calcified bilateral breast implants. Left implant appears smaller than the right. IMPRESSION: 1. No acute osseous abnormality 2. Calcified bilateral breast implants with smaller appearing left implant of uncertain chronicity. Electronically Signed   By: Donavan Foil M.D.   On: 05/09/2017 15:43    Assessment & Plan:   Problem List Items Addressed This Visit    Fecal soiling    She has deferred exam of anal sphicter for examination of tone today. Recommend trial of bulk forming laxative      Impaired fasting glucose - Primary    Secondary to family history of type 2 DM  . Patient is by no means underweight or sedentary.  She has reduced her a1c a full point with a low GI diet   Lab Results  Component Value Date   HGBA1C 5.4 01/18/2018          A total of 25 minutes of face to face time was spent with patient more than half of which was spent in counselling about the the role of diet and exercise in preventing diabetes  , the potential causes of her fecal soiling,  and coordination of care   I am having Rita Taylor. Rita Taylor maintain her CALCIUM 1200, Fish Oil, Turmeric, losartan, amLODipine, and pravastatin.  No orders of the defined types were placed in this encounter.   There are no discontinued medications.  Follow-up: No follow-ups on file.   Crecencio Mc, MD

## 2018-01-18 NOTE — Patient Instructions (Addendum)
Congratulations!!  You have dropped your A1c to 5.4  In  3 months!  Preventing Type 2 Diabetes Mellitus Type 2 diabetes (type 2 diabetes mellitus) is a long-term (chronic) disease that affects blood sugar (glucose) levels. Normally, a hormone called insulin allows glucose to enter cells in the body. The cells use glucose for energy. In type 2 diabetes, one or both of these problems may be present:  The body does not make enough insulin.  The body does not respond properly to insulin that it makes (insulin resistance).  Insulin resistance or lack of insulin causes excess glucose to build up in the blood instead of going into cells. As a result, high blood glucose (hyperglycemia) develops, which can cause many complications. Being overweight or obese and having an inactive (sedentary) lifestyle can increase your risk for diabetes. Type 2 diabetes can be delayed or prevented by making certain nutrition and lifestyle changes. What nutrition changes can be made?  Eat healthy meals and snacks regularly. Keep a healthy snack with you for when you get hungry between meals, such as fruit or a handful of nuts.  Eat lean meats and proteins that are low in saturated fats, such as chicken, fish, egg whites, and beans. Avoid processed meats.  Eat plenty of fruits and vegetables and plenty of grains that have not been processed (whole grains). It is recommended that you eat: ? 1?2 cups of fruit every day. ? 2?3 cups of vegetables every day. ? 6?8 oz of whole grains every day, such as oats, whole wheat, bulgur, brown rice, quinoa, and millet.  Eat low-fat dairy products, such as milk, yogurt, and cheese.  Eat foods that contain healthy fats, such as nuts, avocado, olive oil, and canola oil.  Drink water throughout the day. Avoid drinks that contain added sugar, such as soda or sweet tea.  Follow instructions from your health care provider about specific eating or drinking restrictions.  Control how  much food you eat at a time (portion size). ? Check food labels to find out the serving sizes of foods. ? Use a kitchen scale to weigh amounts of foods.  Saute or steam food instead of frying it. Cook with water or broth instead of oils or butter.  Limit your intake of: ? Salt (sodium). Have no more than 1 tsp (2,400 mg) of sodium a day. If you have heart disease or high blood pressure, have less than ? tsp (1,500 mg) of sodium a day. ? Saturated fat. This is fat that is solid at room temperature, such as butter or fat on meat. What lifestyle changes can be made?  Activity  Do moderate-intensity physical activity for at least 30 minutes on at least 5 days of the week, or as much as told by your health care provider.  Ask your health care provider what activities are safe for you. A mix of physical activities may be best, such as walking, swimming, cycling, and strength training.  Try to add physical activity into your day. For example: ? Park in spots that are farther away than usual, so that you walk more. For example, park in a far corner of the parking lot when you go to the office or the grocery store. ? Take a walk during your lunch break. ? Use stairs instead of elevators or escalators. Weight Loss  Lose weight as directed. Your health care provider can determine how much weight loss is best for you and can help you lose weight safely.  If  you are overweight or obese, you may be instructed to lose at least 5?7 % of your body weight. Alcohol and Tobacco   Limit alcohol intake to no more than 1 drink a day for nonpregnant women and 2 drinks a day for men. One drink equals 12 oz of beer, 5 oz of wine, or 1 oz of hard liquor.  Do not use any tobacco products, such as cigarettes, chewing tobacco, and e-cigarettes. If you need help quitting, ask your health care provider. Work With [place] Provider  Have your blood glucose tested regularly, as told by your health care  provider.  Discuss your risk factors and how you can reduce your risk for diabetes.  Get screening tests as told by your health care provider. You may have screening tests regularly, especially if you have certain risk factors for type 2 diabetes.  Make an appointment with a diet and nutrition specialist (registered dietitian). A registered dietitian can help you make a healthy eating plan and can help you understand portion sizes and food labels. Why are these changes important?  It is possible to prevent or delay type 2 diabetes and related health problems by making lifestyle and nutrition changes.  It can be difficult to recognize signs of type 2 diabetes. The best way to avoid possible damage to your body is to take actions to prevent the disease before you develop symptoms. What can happen if changes are not made?  Your blood glucose levels may keep increasing. Having high blood glucose for a long time is dangerous. Too much glucose in your blood can damage your blood vessels, heart, kidneys, nerves, and eyes.  You may develop prediabetes or type 2 diabetes. Type 2 diabetes can lead to many chronic health problems and complications, such as: ? Heart disease. ? Stroke. ? Blindness. ? Kidney disease. ? Depression. ? Poor circulation in the feet and legs, which could lead to surgical removal (amputation) in severe cases. Where to find support:  Ask your health care provider to recommend a registered dietitian, diabetes educator, or weight loss program.  Look for local or online weight loss groups.  Join a gym, fitness club, or outdoor activity group, such as a walking club. Where to find more information: To learn more about diabetes and diabetes prevention, visit:  American Diabetes Association (ADA): www.diabetes.[employer] of Diabetes and Digestive and Kidney Diseases: FindSpin.nl  To learn more about healthy eating,  visit:  The U.S. Department of Agriculture Scientist, research (physical sciences)), Choose My Plate: http://wiley-williams.com/  Office of Disease Prevention and Health Promotion (ODPHP), Dietary Guidelines: SurferLive.at  Summary  You can reduce your risk for type 2 diabetes by increasing your physical activity, eating healthy foods, and losing weight as directed.  Talk with your health care provider about your risk for type 2 diabetes. Ask about any blood tests or screening tests that you need to have. This information is not intended to replace advice given to you by your health care provider. Make sure you discuss any questions you have with your health care provider. Document Released: 05/26/2015 Document Revised: 07/10/2015 Document Reviewed: 03/25/2015 Elsevier Interactive Patient Education  [employer].

## 2018-01-21 DIAGNOSIS — R7301 Impaired fasting glucose: Secondary | ICD-10-CM | POA: Insufficient documentation

## 2018-01-21 DIAGNOSIS — R151 Fecal smearing: Secondary | ICD-10-CM | POA: Insufficient documentation

## 2018-01-21 DIAGNOSIS — E119 Type 2 diabetes mellitus without complications: Secondary | ICD-10-CM | POA: Insufficient documentation

## 2018-01-21 DIAGNOSIS — R7303 Prediabetes: Secondary | ICD-10-CM | POA: Insufficient documentation

## 2018-01-21 NOTE — Assessment & Plan Note (Addendum)
Secondary to family history of type 2 DM  . Patient is by no means underweight or sedentary.  She has reduced her a1c a full point with a low GI diet   Lab Results  Component Value Date   HGBA1C 5.4 01/18/2018

## 2018-01-21 NOTE — Assessment & Plan Note (Signed)
She has deferred exam of anal sphicter for examination of tone today. Recommend trial of bulk forming laxative

## 2018-01-31 ENCOUNTER — Encounter: Payer: Self-pay | Admitting: Family Medicine

## 2018-01-31 ENCOUNTER — Ambulatory Visit (INDEPENDENT_AMBULATORY_CARE_PROVIDER_SITE_OTHER): Payer: Medicare Other | Admitting: Family Medicine

## 2018-01-31 VITALS — BP 138/58 | HR 80 | Temp 97.5°F | Ht 62.0 in | Wt 113.6 lb

## 2018-01-31 DIAGNOSIS — R42 Dizziness and giddiness: Secondary | ICD-10-CM | POA: Diagnosis not present

## 2018-01-31 MED ORDER — MECLIZINE HCL 25 MG PO TABS: 25.0000 mg | ORAL_TABLET | Freq: Three times a day (TID) | ORAL | 2 refills | Status: DC | PRN

## 2018-01-31 NOTE — Progress Notes (Signed)
Subjective:    Patient ID: Rita Taylor, female    DOB: [birth year], 77 y.o.   MRN: [id number]  HPI  Presents to clinic to discuss medication for vertigo.  Patient states she has had issues with vertigo off and on for many years.  Usually will use a dose of meclizine and will have good success and vertigo resolution.  Patient states she will feel as if the room is spinning especially when going from a sit to stand or when having to turn while walking.  Patient denies any headache, denies any loss of visual field, denies any one-sided weakness, denies any speech difficulty, facial droop.  Patient states the dizziness is not happening right now, but she does not want to go through the holidays without having meclizine to use if she needs it.  Recent labs done in September 2019 reviewed, all stable.   Patient Active Problem List   Diagnosis Date Noted  . Fecal soiling 01/21/2018  . Impaired fasting glucose 01/21/2018  . Distal radial fracture 07/16/2017  . Colon cancer screening 01/16/2017  . Breast cancer screening 01/16/2017  . 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 Hyperlipidemia 07/10/2015  . Piriformis syndrome of left side 01/27/2015  . Routine general medical examination at a health care facility 01/06/2015  . Loss of weight 07/06/2014  . 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 History of melanoma 11/08/2012  . Hypertension 11/15/2011  . Encounter for Medicare annual wellness exam 10/14/2011  . Screening for cervical cancer 10/14/2011   Social History   Tobacco Use  . Smoking status: Never Smoker  . Smokeless tobacco: Never Used  Substance Use Topics  . Alcohol use: Yes    Comment: moderate half a glass daily   Review of Systems   Constitutional: Negative for chills, fatigue and fever.  HENT: Negative for congestion, ear pain, sinus pain and sore throat.   Eyes: Negative.   Respiratory: Negative for cough, shortness of breath and wheezing.     Cardiovascular: Negative for chest pain, palpitations and leg swelling.  Gastrointestinal: Negative for abdominal pain, diarrhea, nausea and vomiting.  Genitourinary: Negative for dysuria, frequency and urgency.  Musculoskeletal: Negative for arthralgias and myalgias.  Skin: Negative for color change, pallor and rash.  Neurological: Negative for syncope, light-headedness and headaches. +dizziness/vertigo  Psychiatric/Behavioral: The patient is not nervous/anxious.       Objective:   Physical Exam  Constitutional: She appears well-developed and well-nourished. No distress.  HENT:  Head: Normocephalic and atraumatic.  Eyes: Pupils are equal, round, and reactive to light. EOM are normal. No scleral icterus.  Neck: Normal range of motion. Neck supple. No tracheal deviation present.  Cardiovascular: Normal rate, regular rhythm and normal heart sounds.  Pulmonary/Chest: Effort normal and breath sounds normal. No respiratory distress. She has no wheezes. She has no rales.  Abdominal: Soft. Bowel sounds are normal. There is no tenderness.  Neurological: She is alert and oriented to person, place, and time. Gait normal.  Speech clear.  Grips equal and strong.  Smile symmetrical, able to raise eyebrows and puff out cheeks and clenched teeth without issue. Skin: Skin is warm and dry. No pallor.  Psychiatric: She has a normal mood and affect. Her behavior is normal. Thought content normal.   Nursing note and vitals reviewed.     Vitals:   01/31/18 1317  BP: (!) 138/58  Pulse: 80  Temp: (!) 97.5 F (36.4 C)  SpO2: 97%   Assessment & Plan:  Vertigo- patient is neurologically intact.  She declines any blood work in clinic today. states the episodes of dizziness will come and go.  Meclizine 25 mg to use as needed sent to pharmacy.  Discussed with patient that if dizziness episodes worsen, become more frequent in nature, are not helped with meclizine that she needs to call office and let us  know right away and we will discuss possible need for imaging of brain at that time.  Patient is aware of any alarm symptoms that could indicate she is having a neurological event including speech difficulty, vision changes, fainting, one-sided weakness, facial droop.  She is aware that if any alarm style symptoms occur she is to go to emergency room right away.  Patient will keep regularly scheduled follow-up PCP as planned.  Advised to return to clinic sooner if any issues arise.

## 2018-02-01 ENCOUNTER — Other Ambulatory Visit: Payer: Self-pay | Admitting: Internal Medicine

## 2018-02-14 DIAGNOSIS — R42 Dizziness and giddiness: Secondary | ICD-10-CM | POA: Diagnosis not present

## 2018-02-14 DIAGNOSIS — I10 Essential (primary) hypertension: Secondary | ICD-10-CM | POA: Diagnosis not present

## 2018-02-14 DIAGNOSIS — H905 Unspecified sensorineural hearing loss: Secondary | ICD-10-CM | POA: Diagnosis not present

## 2018-02-14 DIAGNOSIS — H90A22 Sensorineural hearing loss, unilateral, left ear, with restricted hearing on the contralateral side: Secondary | ICD-10-CM | POA: Diagnosis not present

## 2018-02-14 DIAGNOSIS — H6121 Impacted cerumen, right ear: Secondary | ICD-10-CM | POA: Diagnosis not present

## 2018-02-21 ENCOUNTER — Other Ambulatory Visit: Payer: Self-pay | Admitting: Physician Assistant

## 2018-02-21 DIAGNOSIS — H90A22 Sensorineural hearing loss, unilateral, left ear, with restricted hearing on the contralateral side: Secondary | ICD-10-CM

## 2018-03-20 ENCOUNTER — Ambulatory Visit
Admission: RE | Admit: 2018-03-20 | Discharge: 2018-03-20 | Disposition: A | Discharge status: Home / Self Care | Payer: Medicare Other | Source: Ambulatory Visit | Attending: Physician Assistant | Admitting: Physician Assistant

## 2018-03-20 DIAGNOSIS — H90A22 Sensorineural hearing loss, unilateral, left ear, with restricted hearing on the contralateral side: Secondary | ICD-10-CM | POA: Insufficient documentation

## 2018-03-20 DIAGNOSIS — H9042 Sensorineural hearing loss, unilateral, left ear, with unrestricted hearing on the contralateral side: Secondary | ICD-10-CM | POA: Diagnosis not present

## 2018-03-20 LAB — POCT I-STAT CREATININE: Creatinine, Ser: 0.9 mg/dL (ref 0.44–1.00)

## 2018-03-20 MED ORDER — GADOBUTROL 1 MMOL/ML IV SOLN
5.0000 mL | Freq: Once | INTRAVENOUS | Status: AC | PRN
  Administered 2018-03-20: 5 mL via INTRAVENOUS

## 2018-03-24 DIAGNOSIS — H9042 Sensorineural hearing loss, unilateral, left ear, with unrestricted hearing on the contralateral side: Secondary | ICD-10-CM | POA: Diagnosis not present

## 2018-03-24 DIAGNOSIS — D333 Benign neoplasm of cranial nerves: Secondary | ICD-10-CM | POA: Diagnosis not present

## 2018-03-24 DIAGNOSIS — R42 Dizziness and giddiness: Secondary | ICD-10-CM | POA: Diagnosis not present

## 2018-03-29 ENCOUNTER — Telehealth: Payer: Self-pay

## 2018-03-29 NOTE — Telephone Encounter (Signed)
Copied from [place]. Topic: Appointment Scheduling - Scheduling Inquiry for Clinic >> Mar 28, 2018  2:11 PM Virl Axe D wrote: Reason for CRM: Pt called to follow up on her next Prolia injection. Please advise.

## 2018-03-29 NOTE — Telephone Encounter (Signed)
appt is fine, pt will be there

## 2018-03-29 NOTE — Telephone Encounter (Signed)
Called left message with appointment for Prolia advised patient cannot keep appointment to please call and reschedule.

## 2018-04-03 DIAGNOSIS — H9190 Unspecified hearing loss, unspecified ear: Secondary | ICD-10-CM | POA: Diagnosis not present

## 2018-04-03 DIAGNOSIS — Q8502 Neurofibromatosis, type 2: Secondary | ICD-10-CM | POA: Diagnosis not present

## 2018-04-04 ENCOUNTER — Ambulatory Visit (INDEPENDENT_AMBULATORY_CARE_PROVIDER_SITE_OTHER): Payer: Medicare Other

## 2018-04-04 DIAGNOSIS — M810 Age-related osteoporosis without current pathological fracture: Secondary | ICD-10-CM | POA: Diagnosis not present

## 2018-04-04 MED ORDER — DENOSUMAB 60 MG/ML ~~LOC~~ SOSY
60.0000 mg | PREFILLED_SYRINGE | Freq: Once | SUBCUTANEOUS | Status: AC
  Administered 2018-04-04: 60 mg via SUBCUTANEOUS

## 2018-04-04 NOTE — Progress Notes (Addendum)
Patient presented today for Prolia injection. Right deltoid. Patient voiced no concerns nor showed any signs of distress during injection.   Reviewed.  Dr Nicki Reaper

## 2018-04-17 ENCOUNTER — Telehealth: Payer: Self-pay

## 2018-04-17 DIAGNOSIS — R7303 Prediabetes: Secondary | ICD-10-CM

## 2018-04-17 NOTE — Telephone Encounter (Signed)
Copied from [place] [id number]. Topic: General - Other >> Apr 17, 2018  4:16 PM Rita Taylor L wrote: Reason for CRM: Patient wants to know if Dr. Derrel Nip wants her to have a finger prick to check her A1C? Last checked 01/18/2018. Needs to [facility] if she needs a full appt for this?

## 2018-04-17 NOTE — Addendum Note (Signed)
Addended by: Crecencio Mc on: 04/17/2018 05:13 PM   Modules accepted: Orders

## 2018-04-17 NOTE — Telephone Encounter (Signed)
Does not need to have her A1c repeated until June at her next follow up,  However if she wants to have it done I will order it.  Not before March 5. She does not need to see me.

## 2018-04-17 NOTE — Telephone Encounter (Signed)
Spoke with pt and she stated that she would to go ahead and have it done that way she knows if she is staying on track and what she is doing is working. Pt has been scheduled for a lab appt and lab order has been placed.

## 2018-04-17 NOTE — Telephone Encounter (Signed)
Does pt need to have her A1c rechecked every three months or is she doing every six months? Her last A1c had dropped down to 5.4. Pt's next appt is scheduled for 07/20/2018.

## 2018-04-18 ENCOUNTER — Ambulatory Visit: Payer: Self-pay

## 2018-04-18 NOTE — Telephone Encounter (Signed)
Patient called in and says "I have shingles that started on Sunday. The rash is patchy, red, raised on the point of my right hip around to my butt. It's itching, moderate itch, pain is a 6." I asked about other symptoms, she denies. According to protocol, see PCP within 24 hours, patient says she has other appointments tomorrow, so will not be able to come. No availability with PCP on Thursday, appointment scheduled for Thursday, 04/20/18 at 24 with Philis Nettle, FNP, care advice given, patient verbalized understanding.  Reason for Disposition . [1] Shingles rash (matches SYMPTOMS) AND [2] onset within past 72 hours  Answer Assessment - Initial Assessment Questions 1. APPEARANCE of RASH: "Describe the rash."      Patchy rash, red, raised 2. LOCATION: "Where is the rash located?"      Point of right hip to buttock 3. ONSET: "When did the rash start?"      Sunday 4. ITCHING: "Does the rash itch?" If so, ask: "How bad is the itch?"  (Scale 1-10; or mild, moderate, severe)     Moderate 5. PAIN: "Does the rash hurt?" If so, ask: "How bad is the pain?"  (Scale 1-10; or mild, moderate, severe)     Yes, 6 6. OTHER SYMPTOMS: "Do you have any other symptoms?" (e.g., fever)     No 7. PREGNANCY: "Is there any chance you are pregnant?" "When was your last menstrual period?"     No  Protocols used: [facility]

## 2018-04-20 ENCOUNTER — Encounter: Payer: Self-pay | Admitting: Family Medicine

## 2018-04-20 ENCOUNTER — Ambulatory Visit (INDEPENDENT_AMBULATORY_CARE_PROVIDER_SITE_OTHER): Payer: Medicare Other | Admitting: Family Medicine

## 2018-04-20 VITALS — BP 132/60 | HR 70 | Temp 98.3°F | Resp 18 | Ht 62.0 in | Wt 112.0 lb

## 2018-04-20 DIAGNOSIS — B029 Zoster without complications: Secondary | ICD-10-CM

## 2018-04-20 MED ORDER — LIDOCAINE 5 % EX OINT: 1.0000 "application " | TOPICAL_OINTMENT | Freq: Four times a day (QID) | CUTANEOUS | 0 refills | Status: DC | PRN

## 2018-04-20 MED ORDER — ACETAMINOPHEN-CODEINE #3 300-30 MG PO TABS: 1.0000 | ORAL_TABLET | ORAL | 0 refills | Status: DC | PRN

## 2018-04-20 MED ORDER — VALACYCLOVIR HCL 1 G PO TABS: 1000.0000 mg | ORAL_TABLET | Freq: Three times a day (TID) | ORAL | 0 refills | Status: DC

## 2018-04-20 NOTE — Patient Instructions (Addendum)
Take the Valtrex 1000 mg 3 times a day for 7 days.  Use the Tylenol #3 every 4 hours if needed for more moderate to severe pain.    If pain is only mild, try using regular Tylenol 650 mg and topical lidocaine.    Watch the amount of Tylenol per 24-hour period,  Do not take more than 3000 mg of Tylenol per 24 hours.   Shingles  Shingles is an infection. It gives you a painful skin rash and blisters that have fluid in them. Shingles is caused by the same germ (virus) that causes chickenpox. Shingles only happens in people who:  Have had chickenpox.  Have been given a shot of medicine (vaccine) to protect against chickenpox. Shingles is rare in this group. The first symptoms of shingles may be itching, tingling, or pain in an area on your skin. A rash will show on your skin a few days or weeks later. The rash is likely to be on one side of your body. The rash usually has a shape like a belt or a band. Over time, the rash turns into fluid-filled blisters. The blisters will break open, change into scabs, and dry up. Medicines may:  Help with pain and itching.  Help you get better sooner.  Help to prevent long-term problems. Follow these instructions at home: Medicines  Take over-the-counter and prescription medicines only as told by your doctor.  Put on an anti-itch cream or numbing cream where you have a rash, blisters, or scabs. Do this as told by your doctor. Helping with itching and discomfort   Put cold, wet cloths (cold compresses) on the area of the rash or blisters as told by your doctor.  Cool baths can help you feel better. Try adding baking soda or dry oatmeal to the water to lessen itching. Do not bathe in hot water. Blister and rash care  Keep your rash covered with a loose bandage (dressing).  Wear loose clothing that does not rub on your rash.  Keep your rash and blisters clean. To do this, wash the area with mild soap and cool water as told by your  doctor.  Check your rash every day for signs of infection. Check for: ? More redness, swelling, or pain. ? Fluid or blood. ? Warmth. ? Pus or a bad smell.  Do not scratch your rash. Do not pick at your blisters. To help you to not scratch: ? Keep your fingernails clean and cut short. ? Wear gloves or mittens when you sleep, if scratching is a problem. General instructions  Rest as told by your doctor.  Keep all follow-up visits as told by your doctor. This is important.  Wash your hands often with soap and water. If soap and water are not available, use hand sanitizer. Doing this lowers your chance of getting a skin infection caused by germs (bacteria).  Your infection can cause chickenpox in people who have never had chickenpox or never got a shot of chickenpox vaccine. If you have blisters that did not change into scabs yet, try not to touch other people or be around other people, especially: ? Babies. ? Pregnant women. ? Children who have areas of red, itchy, or rough skin (eczema). ? Very old people who have transplants. ? People who have a long-term (chronic) sickness, like cancer or AIDS. Contact a doctor if:  Your pain does not get better with medicine.  Your pain does not get better after the rash heals.  You have  any signs of infection in the rash area. These signs include: ? More redness, swelling, or pain around the rash. ? Fluid or blood coming from the rash. ? The rash area feeling warm to the touch. ? Pus or a bad smell coming from the rash. Get help right away if:  The rash is on your face or nose.  You have pain in your face or pain by your eye.  You lose feeling on one side of your face.  You have trouble seeing.  You have ear pain, or you have ringing in your ear.  You have a loss of taste.  Your condition gets worse. Summary  Shingles gives you a painful skin rash and blisters that have fluid in them.  Shingles is an infection. It is caused by  the same germ (virus) that causes chickenpox.  Keep your rash covered with a loose bandage (dressing). Wear loose clothing that does not rub on your rash.  If you have blisters that did not change into scabs yet, try not to touch other people or be around people. This information is not intended to replace advice given to you by your health care provider. Make sure you discuss any questions you have with your health care provider. Document Released: 07/21/2007 Document Revised: 10/06/2016 Document Reviewed: 10/06/2016 Elsevier Interactive Patient Education  2019 [employer].

## 2018-04-20 NOTE — Progress Notes (Signed)
Subjective:    Patient ID: Rita Taylor, female    DOB: [birth year], 78 y.o.   MRN: [id number]  HPI   Patient presents to clinic due to rash on right hip and right buttock, suspect this is shingles. First noticed on 04/16/2018  Patient believes it is shingles due to the rash looking similar to when her husband had shingles about a year ago.  Patient did get the shingles vaccine.  States the rash is somewhat painful, but rated it may be a 4-5 out of 10, but states it is not unbearable.  She has been using Tylenol to help reduce pain with overall good effect.  Patient Active Problem List   Diagnosis Date Noted  . Fecal soiling 01/21/2018  . Impaired fasting glucose 01/21/2018  . Distal radial fracture 07/16/2017  . Colon cancer screening 01/16/2017  . Breast cancer screening 01/16/2017  . 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 Hyperlipidemia 07/10/2015  . Piriformis syndrome of left side 01/27/2015  . Routine general medical examination at a health care facility 01/06/2015  . Loss of weight 07/06/2014  . 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 History of melanoma 11/08/2012  . Hypertension 11/15/2011  . Encounter for Medicare annual wellness exam 10/14/2011  . Screening for cervical cancer 10/14/2011   Social History   Tobacco Use  . Smoking status: Never Smoker  . Smokeless tobacco: Never Used  Substance Use Topics  . Alcohol use: Yes    Comment: moderate half a glass daily   Review of Systems  Constitutional: Negative for chills, fatigue and fever.  HENT: Negative for congestion, ear pain, sinus pain and sore throat.   Eyes: Negative.   Respiratory: Negative for cough, shortness of breath and wheezing.   Cardiovascular: Negative for chest pain, palpitations and leg swelling.  Gastrointestinal: Negative for abdominal pain, diarrhea, nausea and vomiting.  Genitourinary: Negative for dysuria, frequency and urgency.  Musculoskeletal: Negative for  arthralgias and myalgias.  Skin: +painful rash on right hip/buttock Neurological: Negative for syncope, light-headedness and headaches.  Psychiatric/Behavioral: The patient is not nervous/anxious.       Objective:   Physical Exam Vitals signs and nursing note reviewed.  Constitutional:      General: She is not in acute distress.    Appearance: She is not toxic-appearing.  HENT:     Head: Normocephalic and atraumatic.  Eyes:     General: No scleral icterus.    Extraocular Movements: Extraocular movements intact.     Conjunctiva/sclera: Conjunctivae normal.  Cardiovascular:     Rate and Rhythm: Normal rate and regular rhythm.  Pulmonary:     Effort: Pulmonary effort is normal. No respiratory distress.     Breath sounds: Normal breath sounds.  Skin:    General: Skin is warm and dry.     Coloration: Skin is not jaundiced or pale.     Findings: Rash present.          Comments: Location of shingles rash indicated by red markings on diagram.  Rash does appear red and vesicular.  There are some dried pustules present as well.  Neurological:     Mental Status: She is alert and oriented to person, place, and time.     Gait: Gait normal.  Psychiatric:        Mood and Affect: Mood normal.        Behavior: Behavior normal.    Vitals:   04/20/18 1048  BP: 132/60  Pulse: 70  Resp: 18  Temp: 98.3 F (36.8 C)  SpO2: 98%      Assessment & Plan:   Shingles - patient will take Valtrex 3 times daily for 7 days.  She will apply topical lidocaine ointment on rash area if needed to help reduce pain.  Patient advised she can use Tylenol if needed for mild pain, also prescribed Tylenol 3 to use if needed for more moderate to severe pain.  Patient aware to not take more than 3000 mg of Tylenol in one 24-hour period.  Patient would prefer not to start something like gabapentin and will use Tylenol and Tylenol 3 as needed for pain.  Patient will otherwise keep regularly scheduled follow-up  with PCP as planned.  Advised she can return to clinic sooner if any issues arise.

## 2018-04-21 ENCOUNTER — Other Ambulatory Visit (INDEPENDENT_AMBULATORY_CARE_PROVIDER_SITE_OTHER): Payer: Medicare Other

## 2018-04-21 DIAGNOSIS — R7303 Prediabetes: Secondary | ICD-10-CM

## 2018-04-21 LAB — POCT GLYCOSYLATED HEMOGLOBIN (HGB A1C): Hemoglobin A1C: 6.1 % — AB (ref 4.0–5.6)

## 2018-04-27 DIAGNOSIS — D2262 Melanocytic nevi of left upper limb, including shoulder: Secondary | ICD-10-CM | POA: Diagnosis not present

## 2018-04-27 DIAGNOSIS — D225 Melanocytic nevi of trunk: Secondary | ICD-10-CM | POA: Diagnosis not present

## 2018-04-27 DIAGNOSIS — D2271 Melanocytic nevi of right lower limb, including hip: Secondary | ICD-10-CM | POA: Diagnosis not present

## 2018-04-27 DIAGNOSIS — L821 Other seborrheic keratosis: Secondary | ICD-10-CM | POA: Diagnosis not present

## 2018-04-27 DIAGNOSIS — D2272 Melanocytic nevi of left lower limb, including hip: Secondary | ICD-10-CM | POA: Diagnosis not present

## 2018-04-27 DIAGNOSIS — Z85820 Personal history of malignant melanoma of skin: Secondary | ICD-10-CM | POA: Diagnosis not present

## 2018-04-27 DIAGNOSIS — D2261 Melanocytic nevi of right upper limb, including shoulder: Secondary | ICD-10-CM | POA: Diagnosis not present

## 2018-04-27 DIAGNOSIS — B029 Zoster without complications: Secondary | ICD-10-CM | POA: Diagnosis not present

## 2018-04-27 DIAGNOSIS — Z08 Encounter for follow-up examination after completed treatment for malignant neoplasm: Secondary | ICD-10-CM | POA: Diagnosis not present

## 2018-05-03 ENCOUNTER — Other Ambulatory Visit: Payer: Self-pay | Admitting: Internal Medicine

## 2018-06-02 DIAGNOSIS — D333 Benign neoplasm of cranial nerves: Secondary | ICD-10-CM | POA: Diagnosis not present

## 2018-06-09 ENCOUNTER — Other Ambulatory Visit: Payer: Self-pay

## 2018-06-09 ENCOUNTER — Ambulatory Visit: Payer: Medicare Other | Admitting: Family Medicine

## 2018-06-09 DIAGNOSIS — R1900 Intra-abdominal and pelvic swelling, mass and lump, unspecified site: Secondary | ICD-10-CM

## 2018-06-09 NOTE — Progress Notes (Signed)
Patient ID: Rita Taylor, female   DOB: [birth year], 78 y.o.   MRN: [id number]  Virtual Visit via video Note  This visit type was conducted due to national recommendations for restrictions regarding the COVID-19 pandemic (e.g. social distancing).  This format is felt to be most appropriate for this patient at this time.  All issues noted in this document were discussed and addressed.  No physical exam was performed (except for noted visual exam findings with Video Visits).   I connected with Rogue Jury on 06/09/18 at  1:00 PM EDT by a video enabled telemedicine application and verified that I am speaking with the correct person using two identifiers. Location patient: home Location provider: [facility] Persons participating in the virtual visit: patient, provider  I discussed the limitations, risks, security and privacy concerns of performing an evaluation and management service by  and the availability of in person appointments. I also discussed with the patient that there may be a patient responsible charge related to this service. The patient expressed understanding and agreed to proceed.   HPI:  Patient and I connected via video today due to complaint of swelling on right side of abdomen that is been present for approximately 5 weeks.  Patient states at first the swelling was about the size of an egg pouched out, then that seemed to recede back on its own.  Now the area of swelling on her right side of abdomen seems to be more flat in shape and is approximately the size of her hand.  States her abdomen is not in any pain, sometimes will have a little bit of soreness when sitting in the car.    Otherwise denies any nausea, vomiting or diarrhea.  Denies any urinary problems.  Denies fever or chills.  Denies any respiratory symptoms.  Denies any chest pain.   ROS: See pertinent positives and negatives per HPI.  Past Medical History:  Diagnosis Date  . Hypertension   . Melanoma  ([place]) 2008   removed by Koleen Nimrod, Wider excision by Tamala Julian left flank    Past Surgical History:  Procedure Laterality Date  . bitubal ligation  1981  . BREAST ENHANCEMENT SURGERY  1990  . GYNECOLOGIC CRYOSURGERY     15 years ago, normal since then, was treated with antibiotics, Annual pap smears for the past 20 years   Family History  Problem Relation Age of Onset  . Hypertension Mother   . Hypertension Father   . Hypertension Brother    Social History   Tobacco Use  . Smoking status: Never Smoker  . Smokeless tobacco: Never Used  Substance Use Topics  . Alcohol use: Yes    Comment: moderate half a glass daily    Current Outpatient Medications:  .  acetaminophen-codeine (TYLENOL #3) 300-30 MG tablet, Take 1 tablet by mouth every 4 (four) hours as needed for moderate pain., Disp: 30 tablet, Rfl: 0 .  amLODipine (NORVASC) 2.5 MG tablet, TAKE 1 TABLET(2.5 MG) BY MOUTH DAILY, Disp: 90 tablet, Rfl: 1 .  Calcium Carbonate-Vit D-Min (CALCIUM 1200) 1200-1000 MG-UNIT CHEW, Chew 1 tablet by mouth daily.  , Disp: , Rfl:  .  lidocaine (XYLOCAINE) 5 % ointment, Apply 1 application topically 4 (four) times daily as needed. Apply to affected rash area., Disp: 35.44 g, Rfl: 0 .  losartan (COZAAR) 100 MG tablet, TAKE 1 TABLET(100 MG) BY MOUTH DAILY, Disp: 90 tablet, Rfl: 1 .  meclizine (ANTIVERT) 25 MG tablet, Take 1 tablet (25 mg total) by  mouth 3 (three) times daily as needed for dizziness., Disp: 30 tablet, Rfl: 2 .  Omega-3 Fatty Acids (FISH OIL) 1000 MG CAPS, Take 1 capsule by mouth 2 (two) times daily., Disp: , Rfl:  .  pravastatin (PRAVACHOL) 20 MG tablet, TAKE 1 TABLET(20 MG) BY MOUTH DAILY, Disp: 90 tablet, Rfl: 1 .  Turmeric 500 MG CAPS, Take 1 capsule by mouth 2 (two) times daily., Disp: , Rfl:  .  valACYclovir (VALTREX) 1000 MG tablet, Take 1 tablet (1,000 mg total) by mouth 3 (three) times daily., Disp: 21 tablet, Rfl: 0  EXAM:  GENERAL: alert, oriented, appears well and in no  acute distress  HEENT: atraumatic, conjunttiva clear, no obvious abnormalities on inspection of external nose and ears  NECK: normal movements of the head and neck  LUNGS: on inspection no signs of respiratory distress, breathing rate appears normal, no obvious gross SOB, gasping or wheezing  ABD: Patient is able to lift from pressure show me her abdomen over the video feed.  Difficult to assess abdomen over the video, patient places her hand over the area that is swollen and slightly sore at times after sitting to show me the size, the size of the area is approximately the size of patient's hand.  No obvious large outpouching of abdominal wall.   CV: no obvious cyanosis  MS: moves all visible extremities without noticeable abnormality  PSYCH/NEURO: pleasant and cooperative, no obvious depression or anxiety, speech and thought processing grossly intact  ASSESSMENT AND PLAN:  Discussed the following assessment and plan:  Swelling of abdominal wall - Plan: US Abdomen Complete  The initial description of the swelling per patient does not sound like it could be a hernia, but the now that she is having the larger amount of swelling that is more flat in shape, I am not so sure about the hernia diagnosis.  It is difficult for me to fully examine the abdomen over video.  Patient advised that as long as she is not having any pain, fever, vomiting or diarrhea I believe that we can monitor area at this time.  I will put an order for abdominal ultrasound to further investigate possible causes of the swelling, and I have made patient aware that this ultrasound imaging most likely will not occur for at least 4 weeks due to nonurgent outpatient testing being pushed back because of the COVID-19 pandemic.  Patient verbalized understanding of this and has no problem with it.  Advised patient that if she begins to develop any alarm symptoms such as pain, vomiting or diarrhea, fever or chills to call office  right away.   I discussed the assessment and treatment plan with the patient. The patient was provided an opportunity to ask questions and all were answered. The patient agreed with the plan and demonstrated an understanding of the instructions.   The patient was advised to call back or seek an in-person evaluation if the symptoms worsen or if the condition fails to improve as anticipated.   Jodelle Green, FNP

## 2018-06-16 HISTORY — PX: TUMOR EXCISION: SHX421

## 2018-06-23 DIAGNOSIS — R200 Anesthesia of skin: Secondary | ICD-10-CM | POA: Diagnosis not present

## 2018-06-23 DIAGNOSIS — Q8502 Neurofibromatosis, type 2: Secondary | ICD-10-CM | POA: Diagnosis not present

## 2018-06-26 DIAGNOSIS — Z1159 Encounter for screening for other viral diseases: Secondary | ICD-10-CM | POA: Diagnosis not present

## 2018-06-28 DIAGNOSIS — Z1159 Encounter for screening for other viral diseases: Secondary | ICD-10-CM | POA: Diagnosis not present

## 2018-06-28 DIAGNOSIS — D496 Neoplasm of unspecified behavior of brain: Secondary | ICD-10-CM | POA: Diagnosis not present

## 2018-06-28 DIAGNOSIS — E7800 Pure hypercholesterolemia, unspecified: Secondary | ICD-10-CM | POA: Diagnosis present

## 2018-06-28 DIAGNOSIS — D333 Benign neoplasm of cranial nerves: Secondary | ICD-10-CM | POA: Diagnosis not present

## 2018-06-28 DIAGNOSIS — I10 Essential (primary) hypertension: Secondary | ICD-10-CM | POA: Diagnosis not present

## 2018-06-28 DIAGNOSIS — G935 Compression of brain: Secondary | ICD-10-CM | POA: Diagnosis present

## 2018-06-28 DIAGNOSIS — H903 Sensorineural hearing loss, bilateral: Secondary | ICD-10-CM | POA: Diagnosis present

## 2018-06-28 DIAGNOSIS — I161 Hypertensive emergency: Secondary | ICD-10-CM | POA: Diagnosis not present

## 2018-06-28 DIAGNOSIS — M8580 Other specified disorders of bone density and structure, unspecified site: Secondary | ICD-10-CM | POA: Diagnosis present

## 2018-06-30 ENCOUNTER — Ambulatory Visit: Payer: Medicare Other

## 2018-07-02 MED ORDER — Medication
5.00 | Status: DC
Start: ? — End: 2018-07-02

## 2018-07-02 MED ORDER — SG DIBROMM 2-12.5 MG/5ML OR ELIX
4.00 | ORAL_SOLUTION | ORAL | Status: DC
Start: 2018-07-02 — End: 2018-07-02

## 2018-07-02 MED ORDER — RIBAVIRIN-INTERFERON ALFA-2B 1200/3000000 UNIT/0.5ML CO KIT
20.00 | PACK | Status: DC
Start: 2018-07-02 — End: 2018-07-02

## 2018-07-02 MED ORDER — QUINERVA 260 MG PO TABS
650.00 | ORAL_TABLET | ORAL | Status: DC
Start: ? — End: 2018-07-02

## 2018-07-02 MED ORDER — HYDRALAZINE HCL 20 MG/ML IJ SOLN
10.00 | INTRAMUSCULAR | Status: DC
Start: ? — End: 2018-07-02

## 2018-07-02 MED ORDER — GLUCOSAMINE-CHONDROIT-COLLAGEN PO
100.00 | ORAL | Status: DC
Start: 2018-07-02 — End: 2018-07-02

## 2018-07-02 MED ORDER — ENOXAPARIN SODIUM 30 MG/0.3ML ~~LOC~~ SOLN
30.00 | SUBCUTANEOUS | Status: DC
Start: 2018-07-02 — End: 2018-07-02

## 2018-07-02 MED ORDER — INSULIN REGULAR HUMAN 100 UNIT/ML IJ SOLN
0.00 | INTRAMUSCULAR | Status: DC
Start: 2018-07-02 — End: 2018-07-02

## 2018-07-02 MED ORDER — Medication
100.00 | Status: DC
Start: 2018-07-03 — End: 2018-07-02

## 2018-07-02 MED ORDER — DEXTROSE 10 % IV SOLN
12.50 | INTRAVENOUS | Status: DC
Start: ? — End: 2018-07-02

## 2018-07-02 MED ORDER — OXYCODONE HCL 5 MG PO TABS
5.00 | ORAL_TABLET | ORAL | Status: DC
Start: ? — End: 2018-07-02

## 2018-07-02 MED ORDER — LABETALOL HCL 5 MG/ML IV SOLN
10.00 | INTRAVENOUS | Status: DC
Start: ? — End: 2018-07-02

## 2018-07-02 MED ORDER — AMLODIPINE BESYLATE 2.5 MG PO TABS
2.50 | ORAL_TABLET | ORAL | Status: DC
Start: 2018-07-02 — End: 2018-07-02

## 2018-07-02 MED ORDER — CEFAZOLIN SODIUM-DEXTROSE 2-3 GM-%(50ML) IV SOLR
2.00 | INTRAVENOUS | Status: DC
Start: 2018-07-02 — End: 2018-07-02

## 2018-07-03 ENCOUNTER — Other Ambulatory Visit: Payer: Self-pay | Admitting: Internal Medicine

## 2018-07-03 DIAGNOSIS — E785 Hyperlipidemia, unspecified: Secondary | ICD-10-CM | POA: Diagnosis not present

## 2018-07-03 DIAGNOSIS — D333 Benign neoplasm of cranial nerves: Secondary | ICD-10-CM | POA: Diagnosis not present

## 2018-07-03 DIAGNOSIS — I10 Essential (primary) hypertension: Secondary | ICD-10-CM | POA: Diagnosis not present

## 2018-07-03 DIAGNOSIS — Z483 Aftercare following surgery for neoplasm: Secondary | ICD-10-CM | POA: Diagnosis not present

## 2018-07-03 DIAGNOSIS — D331 Benign neoplasm of brain, infratentorial: Secondary | ICD-10-CM | POA: Diagnosis not present

## 2018-07-04 ENCOUNTER — Telehealth: Payer: Self-pay

## 2018-07-04 NOTE — Telephone Encounter (Signed)
Verbal orders given  

## 2018-07-04 NOTE — Telephone Encounter (Signed)
Copied from [place] [id number]. Topic: Quick Communication - Home Health Verbal Orders >> Jul 03, 2018  4:20 PM Loma Boston wrote: CRM for notification. See Telephone encounter for: 07/03/18. [facility] called... Hoyle Sauer)  pt needs verbal orders for physical therapy for pt. who is Rita Taylor. The request is for 2 times wk for 2 wks and 1 time a wk for 6 wk. Please send request confirmaation order to [phone]

## 2018-07-06 DIAGNOSIS — D331 Benign neoplasm of brain, infratentorial: Secondary | ICD-10-CM | POA: Diagnosis not present

## 2018-07-06 DIAGNOSIS — D333 Benign neoplasm of cranial nerves: Secondary | ICD-10-CM | POA: Diagnosis not present

## 2018-07-06 DIAGNOSIS — Z483 Aftercare following surgery for neoplasm: Secondary | ICD-10-CM | POA: Diagnosis not present

## 2018-07-06 DIAGNOSIS — I10 Essential (primary) hypertension: Secondary | ICD-10-CM | POA: Diagnosis not present

## 2018-07-06 DIAGNOSIS — E785 Hyperlipidemia, unspecified: Secondary | ICD-10-CM | POA: Diagnosis not present

## 2018-07-11 DIAGNOSIS — I10 Essential (primary) hypertension: Secondary | ICD-10-CM | POA: Diagnosis not present

## 2018-07-11 DIAGNOSIS — E785 Hyperlipidemia, unspecified: Secondary | ICD-10-CM | POA: Diagnosis not present

## 2018-07-11 DIAGNOSIS — Z483 Aftercare following surgery for neoplasm: Secondary | ICD-10-CM | POA: Diagnosis not present

## 2018-07-11 DIAGNOSIS — D331 Benign neoplasm of brain, infratentorial: Secondary | ICD-10-CM | POA: Diagnosis not present

## 2018-07-11 DIAGNOSIS — D333 Benign neoplasm of cranial nerves: Secondary | ICD-10-CM | POA: Diagnosis not present

## 2018-07-13 ENCOUNTER — Telehealth: Payer: Self-pay

## 2018-07-13 DIAGNOSIS — D333 Benign neoplasm of cranial nerves: Secondary | ICD-10-CM | POA: Diagnosis not present

## 2018-07-13 DIAGNOSIS — E785 Hyperlipidemia, unspecified: Secondary | ICD-10-CM | POA: Diagnosis not present

## 2018-07-13 DIAGNOSIS — M533 Sacrococcygeal disorders, not elsewhere classified: Secondary | ICD-10-CM | POA: Diagnosis not present

## 2018-07-13 DIAGNOSIS — Z483 Aftercare following surgery for neoplasm: Secondary | ICD-10-CM | POA: Diagnosis not present

## 2018-07-13 DIAGNOSIS — D331 Benign neoplasm of brain, infratentorial: Secondary | ICD-10-CM | POA: Diagnosis not present

## 2018-07-13 DIAGNOSIS — I10 Essential (primary) hypertension: Secondary | ICD-10-CM | POA: Diagnosis not present

## 2018-07-13 DIAGNOSIS — M25552 Pain in left hip: Secondary | ICD-10-CM | POA: Diagnosis not present

## 2018-07-13 NOTE — Telephone Encounter (Signed)
Copied from [place] [id number]. Topic: General - Inquiry >> Jul 13, 2018  3:07 PM Mathis Bud wrote: Reason for CRM: Hoyle Sauer from [facility] home health called stating she needs to know if patient is diagnosed with hypertension and hyperlipidemia and to get more medical information regarding all the medications that she is on.  Lorenza would like PCP or nurse to call back Call back # [phone]

## 2018-07-14 NOTE — Telephone Encounter (Signed)
Spoke with Hoyle Sauer from [facility] to confirm with her that the pt does have a diagnosis of Hypertension and Hyperlipidemia.

## 2018-07-14 NOTE — Telephone Encounter (Signed)
LMTCB. Please transfer Rickeya from [place] to our office.

## 2018-07-17 ENCOUNTER — Telehealth: Payer: Self-pay

## 2018-07-17 DIAGNOSIS — D333 Benign neoplasm of cranial nerves: Secondary | ICD-10-CM | POA: Diagnosis not present

## 2018-07-17 DIAGNOSIS — Z483 Aftercare following surgery for neoplasm: Secondary | ICD-10-CM | POA: Diagnosis not present

## 2018-07-17 DIAGNOSIS — I10 Essential (primary) hypertension: Secondary | ICD-10-CM | POA: Diagnosis not present

## 2018-07-17 DIAGNOSIS — D331 Benign neoplasm of brain, infratentorial: Secondary | ICD-10-CM | POA: Diagnosis not present

## 2018-07-17 DIAGNOSIS — E785 Hyperlipidemia, unspecified: Secondary | ICD-10-CM

## 2018-07-17 NOTE — Telephone Encounter (Signed)
Brianna from [facility] needs a clarification of [place], you ordered AB complete and this is looking at all organs but the order states AB wall and they need to know what you want to see on the [place]. I stated I would return a call to her [phone].  Sheana Bir,cma

## 2018-07-17 NOTE — Telephone Encounter (Signed)
Patient has hernia vs lipoma of ABD wall -- trying to differentiate  If I need to change order type please let me know

## 2018-07-18 ENCOUNTER — Other Ambulatory Visit: Payer: Self-pay

## 2018-07-18 ENCOUNTER — Ambulatory Visit
Admission: RE | Admit: 2018-07-18 | Discharge: 2018-07-18 | Disposition: A | Discharge status: Home / Self Care | Payer: Medicare Other | Source: Ambulatory Visit | Attending: Family Medicine | Admitting: Family Medicine

## 2018-07-18 ENCOUNTER — Other Ambulatory Visit: Payer: Self-pay | Admitting: Family Medicine

## 2018-07-18 DIAGNOSIS — R1900 Intra-abdominal and pelvic swelling, mass and lump, unspecified site: Secondary | ICD-10-CM | POA: Diagnosis not present

## 2018-07-18 DIAGNOSIS — Z0389 Encounter for observation for other suspected diseases and conditions ruled out: Secondary | ICD-10-CM | POA: Diagnosis not present

## 2018-07-18 NOTE — Telephone Encounter (Signed)
Colletta Maryland calling back to correct order.  If this is what you want, hernia vs lipoma of abd wall,  this needs to be an Abdomen limited US. She can change the order for you, if Lauren will sign off.  Colletta Maryland direct line:  [phone]  Pt will be there at 10 am today Please advise

## 2018-07-20 ENCOUNTER — Encounter: Payer: Self-pay | Admitting: Internal Medicine

## 2018-07-20 ENCOUNTER — Other Ambulatory Visit: Payer: Self-pay

## 2018-07-20 ENCOUNTER — Ambulatory Visit (INDEPENDENT_AMBULATORY_CARE_PROVIDER_SITE_OTHER): Payer: Medicare Other | Admitting: Internal Medicine

## 2018-07-20 ENCOUNTER — Ambulatory Visit: Payer: Medicare Other | Admitting: Internal Medicine

## 2018-07-20 DIAGNOSIS — E785 Hyperlipidemia, unspecified: Secondary | ICD-10-CM | POA: Diagnosis not present

## 2018-07-20 DIAGNOSIS — R7301 Impaired fasting glucose: Secondary | ICD-10-CM | POA: Diagnosis not present

## 2018-07-20 DIAGNOSIS — E7800 Pure hypercholesterolemia, unspecified: Secondary | ICD-10-CM

## 2018-07-20 DIAGNOSIS — D333 Benign neoplasm of cranial nerves: Secondary | ICD-10-CM | POA: Diagnosis not present

## 2018-07-20 DIAGNOSIS — I10 Essential (primary) hypertension: Secondary | ICD-10-CM | POA: Diagnosis not present

## 2018-07-20 DIAGNOSIS — Z483 Aftercare following surgery for neoplasm: Secondary | ICD-10-CM | POA: Diagnosis not present

## 2018-07-20 DIAGNOSIS — D649 Anemia, unspecified: Secondary | ICD-10-CM | POA: Diagnosis not present

## 2018-07-20 DIAGNOSIS — R1909 Other intra-abdominal and pelvic swelling, mass and lump: Secondary | ICD-10-CM | POA: Diagnosis not present

## 2018-07-20 DIAGNOSIS — D331 Benign neoplasm of brain, infratentorial: Secondary | ICD-10-CM | POA: Diagnosis not present

## 2018-07-20 NOTE — Progress Notes (Signed)
Telephone Note  This visit type was conducted due to national recommendations for restrictions regarding the COVID-19 pandemic (e.g. social distancing).  This format is felt to be most appropriate for this patient at this time.  All issues noted in this document were discussed and addressed.  No physical exam was performed (except for noted visual exam findings with Video Visits).   I connected with@ on 07/20/18 at  9:00 AM EDT by telephone and verified that I am speaking with the correct person using two identifiers. Location patient: home Location provider: work or home office Persons participating in the virtual visit: patient, provider  I discussed the limitations, risks, security and privacy concerns of performing an evaluation and management service by telephone and the availability of in person appointments. I also discussed with the patient that there may be a patient responsible charge related to this service. The patient expressed understanding and agreed to proceed.  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  The surgery involved a translabrynthine approach and an abdominal fat pad graft .  She was prescribed eye drops and ocular lubricant and tape to manage residual incomplete eye closure and is running out of the tape and cannot find it. locally.Otherwise she is improving steadily and has been ambulating independently  Without walker for the past 2 weeks  Had a telephone visit with NP for evaluation of anabdominal mass found on self exam . She feels she has a hernia  To the right of umbilicus.  Spans the width of her hand.  No history of abdominal surgery. Not in the area of the abdominal fat pad  growth   Had an ultrasound of abdomen that failed to demonstrate a periumbilical  hernia    ROS: See pertinent positives and negatives per HPI.  Past Medical History:  Diagnosis Date  . Hypertension   . Melanoma ([place]) 2008   removed by Koleen Nimrod, Wider excision by Tamala Julian left flank    Past Surgical History:  Procedure Laterality Date  . bitubal ligation  1981  . BREAST ENHANCEMENT SURGERY  1990  . GYNECOLOGIC CRYOSURGERY     15 years ago, normal since then, was treated with antibiotics, Annual pap smears for the past 20 years    Family History  Problem Relation Age of Onset  . Hypertension Mother   . Hypertension Father   . Hypertension Brother     SOCIAL HX:  reports that she has never smoked. She has never used smokeless tobacco. She reports current alcohol use. She reports that she does not use drugs.   Current Outpatient Medications:  .  amLODipine (NORVASC) 2.5 MG tablet, TAKE 1 TABLET(2.5 MG) BY MOUTH DAILY, Disp: 90 tablet, Rfl: 1 .  Calcium Carbonate-Vit D-Min (CALCIUM 1200) 1200-1000 MG-UNIT CHEW, Chew 1 tablet by mouth daily.  , Disp: , Rfl:  .  Carboxymethylcellulose Sodium (THERATEARS) 0.25 % SOLN, Administer 1 drop into the left eye Four (4) times a day., Disp: , Rfl:  .  denosumab (PROLIA) 60 MG/ML SOSY injection, Inject into the skin., Disp: , Rfl:  .  losartan (COZAAR) 100 MG tablet, TAKE 1 TABLET(100 MG) BY MOUTH DAILY, Disp: 90 tablet, Rfl: 1 .  Omega-3 Fatty  Acids (FISH OIL) 1000 MG CAPS, Take 1 capsule by mouth 2 (two) times daily., Disp: , Rfl:  .  pravastatin (PRAVACHOL) 20 MG tablet, TAKE 1 TABLET(20 MG) BY MOUTH DAILY, Disp: 90 tablet, Rfl: 1 .  Turmeric 500 MG CAPS, Take 1 capsule by mouth 2 (two) times daily., Disp: , Rfl:  .  White Petrolatum-Mineral Oil (ARTIFICIAL TEARS) ointment, Administer 1 application into the left eye nightly., Disp: , Rfl:  .  acetaminophen-codeine (TYLENOL #3) 300-30 MG tablet, Take 1 tablet by mouth every 4 (four) hours as  needed for moderate pain. (Patient not taking: Reported on 07/20/2018), Disp: 30 tablet, Rfl: 0 .  meclizine (ANTIVERT) 25 MG tablet, Take 1 tablet (25 mg total) by mouth 3 (three) times daily as needed for dizziness. (Patient not taking: Reported on 07/20/2018), Disp: 30 tablet, Rfl: 2  EXAM:   General impression: alert, cooperative and articulate.  No signs of being in distress  Lungs: speech is fluent sentence length suggests that patient is not short of breath and not punctuated by cough, sneezing or sniffing. Marland Kitchen   Psych: affect normal.  speech is articulate and non pressured .  Denies suicidal thoughts   ASSESSMENT AND PLAN:  Discussed the following assessment and plan:  Anemia, unspecified type - Plan: CBC with Differential/Platelet, Iron, TIBC and Ferritin Panel  Impaired fasting glucose - Plan: Hemoglobin A1c, Comprehensive metabolic panel  Postoperative anemia  Pure hypercholesterolemia  Central abdominal mass  Postoperative anemia hgb fell to 10.6 post operatively.  Repeat CBC ordered   Impaired fasting glucose Secondary to family history of type 2 DM  . Patient is by no means overweight or sedentary.  She had  reduced her a1c from 6.5 to 5.4 last year a low GI diet ; repeat a1c was higher in march.    Lab Results  Component Value Date   HGBA1C 6.1 (A) 04/21/2018     Hyperlipidemia Marked imporvement with initiation of statin . She is tolerating medication and LFTS are normal   ASA stopped after discussion of recent ASPREE trial and dicussion of risks and benefits.   Lab Results  Component Value Date   CHOL 170 10/19/2017   HDL 77.60 10/19/2017   LDLCALC 83 10/19/2017   LDLDIRECT 137.0 01/06/2015   TRIG 48.0 10/19/2017   CHOLHDL 2 10/19/2017   Lab Results  Component Value Date   ALT 12 10/19/2017   AST 16 10/19/2017   ALKPHOS 33 (L) 10/19/2017   BILITOT 0.7 10/19/2017     Central abdominal mass In the absence of a physical exam, patient had an  ultrasound done to rule out a periumbilical hernia. Encouraged patient to schedule a physical exam     I discussed the assessment and treatment plan with the patient. The patient was provided an opportunity to ask questions and all were answered. The patient agreed with the plan and demonstrated an understanding of the instructions.   The patient was advised to call back or seek an in-person evaluation if the symptoms worsen or if the condition fails to improve as anticipated.  I provided 25 minutes of non-face-to-face time during this encounter.   Crecencio Mc, MD

## 2018-07-21 ENCOUNTER — Ambulatory Visit: Payer: Medicare Other

## 2018-07-22 DIAGNOSIS — R1909 Other intra-abdominal and pelvic swelling, mass and lump: Secondary | ICD-10-CM | POA: Insufficient documentation

## 2018-07-22 DIAGNOSIS — D649 Anemia, unspecified: Secondary | ICD-10-CM

## 2018-07-22 HISTORY — DX: Anemia, unspecified: D64.9

## 2018-07-22 NOTE — Assessment & Plan Note (Addendum)
Secondary to family history of type 2 DM  . Patient is by no means overweight or sedentary.  She had  reduced her a1c from 6.5 to 5.4 last year a low GI diet ; repeat a1c was higher in march.    Lab Results  Component Value Date   HGBA1C 6.1 (A) 04/21/2018

## 2018-07-22 NOTE — Assessment & Plan Note (Signed)
hgb fell to 10.6 post operatively.  Repeat CBC ordered

## 2018-07-22 NOTE — Assessment & Plan Note (Signed)
Marked imporvement with initiation of statin . She is tolerating medication and LFTS are normal   ASA stopped after discussion of recent ASPREE trial and dicussion of risks and benefits.   Lab Results  Component Value Date   CHOL 170 10/19/2017   HDL 77.60 10/19/2017   LDLCALC 83 10/19/2017   LDLDIRECT 137.0 01/06/2015   TRIG 48.0 10/19/2017   CHOLHDL 2 10/19/2017   Lab Results  Component Value Date   ALT 12 10/19/2017   AST 16 10/19/2017   ALKPHOS 33 (L) 10/19/2017   BILITOT 0.7 10/19/2017

## 2018-07-22 NOTE — Assessment & Plan Note (Addendum)
In the absence of a physical exam, patient had an ultrasound done to rule out a periumbilical hernia. Encouraged patient to schedule a physical exam

## 2018-07-26 ENCOUNTER — Other Ambulatory Visit: Payer: Self-pay

## 2018-07-26 ENCOUNTER — Ambulatory Visit (INDEPENDENT_AMBULATORY_CARE_PROVIDER_SITE_OTHER): Payer: Medicare Other | Admitting: Internal Medicine

## 2018-07-26 ENCOUNTER — Encounter: Payer: Self-pay | Admitting: Internal Medicine

## 2018-07-26 DIAGNOSIS — R634 Abnormal weight loss: Secondary | ICD-10-CM

## 2018-07-26 DIAGNOSIS — R1909 Other intra-abdominal and pelvic swelling, mass and lump: Secondary | ICD-10-CM | POA: Diagnosis not present

## 2018-07-26 DIAGNOSIS — R7301 Impaired fasting glucose: Secondary | ICD-10-CM

## 2018-07-26 DIAGNOSIS — D333 Benign neoplasm of cranial nerves: Secondary | ICD-10-CM | POA: Diagnosis not present

## 2018-07-26 NOTE — Progress Notes (Signed)
Subjective:  Patient ID: Rita Taylor, female    DOB: 03/14/1940  Age: 78 y.o. MRN: [id number]  CC: Diagnoses of Loss of weight, Central abdominal mass, and Impaired fasting glucose were pertinent to this visit.  HPI Rita Taylor presents for evaluation of a previously perceived mass in her lower abdomen on the right.  Today she feels it has resolved, but thought initially that she had developed a ventral hernia.     She is recovering from transtemporal surgery to remove a vestibular schwannoma from her left CPA that was causing brainstem compression,  Facial numbness and hearing loss.  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  Sh is feeling better but losing weight despite trying to gain.  She has lost 5 lbs .No nausea.    Outpatient Medications Prior to Visit  Medication Sig Dispense Refill  . acetaminophen-codeine (TYLENOL #3) 300-30 MG tablet Take 1 tablet by mouth every 4 (four) hours as needed for moderate pain. 30 tablet 0  . amLODipine (NORVASC) 2.5 MG tablet TAKE 1 TABLET(2.5 MG) BY MOUTH DAILY 90 tablet 1  . Calcium Carbonate-Vit D-Min (CALCIUM 1200) 1200-1000 MG-UNIT CHEW Chew 1 tablet by mouth daily.      . Carboxymethylcellulose Sodium (THERATEARS) 0.25 % SOLN Administer 1 drop into the left eye Four (4) times a day.    . denosumab (PROLIA) 60 MG/ML SOSY injection Inject into the skin.    Marland Kitchen losartan (COZAAR) 100 MG tablet TAKE 1 TABLET(100 MG) BY MOUTH DAILY 90 tablet 1  . meclizine (ANTIVERT) 25 MG tablet Take 1 tablet (25 mg total) by mouth 3 (three) times daily as needed for dizziness. 30 tablet 2  . Omega-3 Fatty Acids (FISH OIL) 1000 MG CAPS Take 1 capsule by mouth 2 (two) times daily.    . pravastatin (PRAVACHOL) 20 MG tablet TAKE 1 TABLET(20 MG) BY MOUTH DAILY 90 tablet 1  . Turmeric 500 MG CAPS Take 1 capsule by mouth 2 (two) times daily.    Dema Severin Petrolatum-Mineral Oil (ARTIFICIAL TEARS) ointment Administer 1  application into the left eye nightly.     No facility-administered medications prior to visit.     Review of Systems;  Patient denies headache, fevers, malaise, unintentional weight loss, skin rash, eye pain, sinus congestion and sinus pain, sore throat, dysphagia,  hemoptysis , cough, dyspnea, wheezing, chest pain, palpitations, orthopnea, edema, abdominal pain, nausea, melena, diarrhea, constipation, flank pain, dysuria, hematuria, urinary  Frequency, nocturia, numbness, tingling, seizures,  Focal weakness, Loss of consciousness,  Tremor, insomnia, depression, anxiety, and suicidal ideation.      Objective:  BP (!) 160/70   Pulse 81   Temp 98.3 F (36.8 C) (Oral)   Ht 5\' 3"  (1.6 m)   Wt 108 lb 6.4 oz (49.2 kg)   SpO2 99%   BMI 19.20 kg/m   BP Readings from Last 3 Encounters:  07/26/18 (!) 160/70  04/20/18 132/60  01/31/18 (!) 138/58    Wt Readings from Last 3 Encounters:  07/26/18 108 lb 6.4 oz (49.2 kg)  04/20/18 112 lb (50.8 kg)  01/31/18 113 lb 9.6 oz (51.5 kg)    General appearance: alert, cooperative and appears stated age Ears: normal TM's and external ear canals both ears Eyes: decreased blink of left eyelid  Throat: lips, mucosa, and tongue normal; teeth and gums normal Neck: no adenopathy, no carotid bruit, supple, symmetrical, trachea midline and thyroid not enlarged, symmetric, no tenderness/mass/nodules Back: symmetric, no  curvature. ROM normal. No CVA tenderness. Lungs: clear to auscultation bilaterally Heart: regular rate and rhythm, S1, S2 normal, no murmur, click, rub or gallop Abdomen: soft, non-tender; bowel sounds normal; no masses,  no organomegaly. Well healed horizontal surgical scar left lower abdomen  Just below umbilicus (site of fat graft) Pulses: 2+ and symmetric Skin: Skin color, texture, turgor normal. No rashes or lesions Lymph nodes: Cervical, supraclavicular, and axillary nodes normal.  Lab Results  Component Value Date   HGBA1C 6.1  (A) 04/21/2018   HGBA1C 5.4 01/18/2018   HGBA1C 6.4 10/19/2017    Lab Results  Component Value Date   CREATININE 0.90 03/20/2018   CREATININE 0.85 10/19/2017   CREATININE 0.77 05/13/2017    Lab Results  Component Value Date   WBC 6.5 01/05/2016   HGB 12.5 01/05/2016   HCT 36.9 01/05/2016   PLT 244.0 01/05/2016   GLUCOSE 99 10/19/2017   CHOL 170 10/19/2017   TRIG 48.0 10/19/2017   HDL 77.60 10/19/2017   LDLDIRECT 137.0 01/06/2015   LDLCALC 83 10/19/2017   ALT 12 10/19/2017   AST 16 10/19/2017   NA 141 10/19/2017   K 4.5 10/19/2017   CL 103 10/19/2017   CREATININE 0.90 03/20/2018   BUN 19 10/19/2017   CO2 31 10/19/2017   TSH 1.18 10/19/2017   HGBA1C 6.1 (A) 04/21/2018   MICROALBUR 0.1 01/26/2012    US Abdomen Limited  Result Date: 07/18/2018 CLINICAL DATA:  Possible periumbilical hernia EXAM: ULTRASOUND ABDOMEN LIMITED COMPARISON:  None. FINDINGS: No findings to suggest umbilical or periumbilical hernia are noted. IMPRESSION: No evidence of hernia identified. Electronically Signed   By: Inez Catalina M.D.   On: 07/18/2018 13:11    Assessment & Plan:   Problem List Items Addressed This Visit    Loss of weight     I have reviewed her diet and recommended that she increase her protein and fat intake while monitoring her carbohydrates.  Samples of protein shakes given       Impaired fasting glucose    Secondary to family history of type 2 DM  . Patient is by no means overweight or sedentary.  She had  reduced her a1c from 6.5 to 5.4 last year a low GI diet ; repeat a1c was higher in march. Will repeat in September   Lab Results  Component Value Date   HGBA1C 6.1 (A) 04/21/2018         Central abdominal mass    I find no evidence of a mss or hernia on physical exam today both in supine and standing position          I am having Rita Taylor maintain her Calcium 1200, Fish Oil, Turmeric, meclizine, losartan, acetaminophen-codeine, amLODipine, pravastatin,  denosumab, Carboxymethylcellulose Sodium, and artificial tears.  No orders of the defined types were placed in this encounter.   There are no discontinued medications.  Follow-up: No follow-ups on file.   Crecencio Mc, MD

## 2018-07-27 ENCOUNTER — Telehealth: Payer: Self-pay

## 2018-07-27 DIAGNOSIS — D333 Benign neoplasm of cranial nerves: Secondary | ICD-10-CM | POA: Diagnosis not present

## 2018-07-27 DIAGNOSIS — Z483 Aftercare following surgery for neoplasm: Secondary | ICD-10-CM | POA: Diagnosis not present

## 2018-07-27 DIAGNOSIS — D331 Benign neoplasm of brain, infratentorial: Secondary | ICD-10-CM | POA: Diagnosis not present

## 2018-07-27 DIAGNOSIS — E785 Hyperlipidemia, unspecified: Secondary | ICD-10-CM | POA: Diagnosis not present

## 2018-07-27 DIAGNOSIS — I10 Essential (primary) hypertension: Secondary | ICD-10-CM | POA: Diagnosis not present

## 2018-07-27 NOTE — Assessment & Plan Note (Signed)
I find no evidence of a mss or hernia on physical exam today both in supine and standing position

## 2018-07-27 NOTE — Telephone Encounter (Signed)
Copied from [place] [id number]. Topic: Appointment Scheduling - Scheduling Inquiry for Clinic >> Jul 27, 2018  1:27 PM Berneta Levins wrote: Reason for CRM:   Pt was told to call and schedule a lab appointment.

## 2018-07-27 NOTE — Assessment & Plan Note (Signed)
I have reviewed her diet and recommended that she increase her protein and fat intake while monitoring her carbohydrates.  Samples of protein shakes given

## 2018-07-27 NOTE — Assessment & Plan Note (Signed)
Secondary to family history of type 2 DM  . Patient is by no means overweight or sedentary.  She had  reduced her a1c from 6.5 to 5.4 last year a low GI diet ; repeat a1c was higher in march. Will repeat in September   Lab Results  Component Value Date   HGBA1C 6.1 (A) 04/21/2018

## 2018-08-01 ENCOUNTER — Other Ambulatory Visit: Payer: Self-pay

## 2018-08-01 ENCOUNTER — Other Ambulatory Visit (INDEPENDENT_AMBULATORY_CARE_PROVIDER_SITE_OTHER): Payer: Medicare Other

## 2018-08-01 DIAGNOSIS — R7301 Impaired fasting glucose: Secondary | ICD-10-CM | POA: Diagnosis not present

## 2018-08-01 DIAGNOSIS — D649 Anemia, unspecified: Secondary | ICD-10-CM

## 2018-08-01 LAB — CBC WITH DIFFERENTIAL/PLATELET
Basophils Absolute: 0.1 10*3/uL (ref 0.0–0.1)
Basophils Relative: 1.1 % (ref 0.0–3.0)
Eosinophils Absolute: 0 10*3/uL (ref 0.0–0.7)
Eosinophils Relative: 0.5 % (ref 0.0–5.0)
HCT: 39.4 % (ref 36.0–46.0)
Hemoglobin: 12.9 g/dL (ref 12.0–15.0)
Lymphocytes Relative: 35 % (ref 12.0–46.0)
Lymphs Abs: 2.3 10*3/uL (ref 0.7–4.0)
MCHC: 32.7 g/dL (ref 30.0–36.0)
MCV: 92.7 fl (ref 78.0–100.0)
Monocytes Absolute: 0.5 10*3/uL (ref 0.1–1.0)
Monocytes Relative: 7.1 % (ref 3.0–12.0)
Neutro Abs: 3.8 10*3/uL (ref 1.4–7.7)
Neutrophils Relative %: 56.3 % (ref 43.0–77.0)
Platelets: 249 10*3/uL (ref 150.0–400.0)
RBC: 4.25 Mil/uL (ref 3.87–5.11)
RDW: 14.5 % (ref 11.5–15.5)
WBC: 6.7 10*3/uL (ref 4.0–10.5)

## 2018-08-01 LAB — COMPREHENSIVE METABOLIC PANEL
ALT: 17 U/L (ref 0–35)
AST: 15 U/L (ref 0–37)
Albumin: 4.5 g/dL (ref 3.5–5.2)
Alkaline Phosphatase: 37 U/L — ABNORMAL LOW (ref 39–117)
BUN: 24 mg/dL — ABNORMAL HIGH (ref 6–23)
CO2: 30 mEq/L (ref 19–32)
Calcium: 9.3 mg/dL (ref 8.4–10.5)
Chloride: 100 mEq/L (ref 96–112)
Creatinine, Ser: 0.73 mg/dL (ref 0.40–1.20)
GFR: 76.96 mL/min (ref >60.00)
Glucose, Bld: 97 mg/dL (ref 70–99)
Potassium: 4.2 mEq/L (ref 3.5–5.1)
Sodium: 138 mEq/L (ref 135–145)
Total Bilirubin: 0.6 mg/dL (ref 0.2–1.2)
Total Protein: 6.6 g/dL (ref 6.0–8.3)

## 2018-08-01 LAB — HEMOGLOBIN A1C: Hgb A1c MFr Bld: 6.3 % (ref 4.6–6.5)

## 2018-08-01 LAB — IBC + FERRITIN
Ferritin: 48 ng/mL (ref 10.0–291.0)
Iron: 89 ug/dL (ref 42–145)
Saturation Ratios: 22.3 % (ref 20.0–50.0)
Transferrin: 285 mg/dL (ref 212.0–360.0)

## 2018-08-02 DIAGNOSIS — E785 Hyperlipidemia, unspecified: Secondary | ICD-10-CM | POA: Diagnosis not present

## 2018-08-02 DIAGNOSIS — I10 Essential (primary) hypertension: Secondary | ICD-10-CM | POA: Diagnosis not present

## 2018-08-02 DIAGNOSIS — D333 Benign neoplasm of cranial nerves: Secondary | ICD-10-CM | POA: Diagnosis not present

## 2018-08-02 DIAGNOSIS — Z483 Aftercare following surgery for neoplasm: Secondary | ICD-10-CM | POA: Diagnosis not present

## 2018-08-02 DIAGNOSIS — D331 Benign neoplasm of brain, infratentorial: Secondary | ICD-10-CM | POA: Diagnosis not present

## 2018-08-03 DIAGNOSIS — Z483 Aftercare following surgery for neoplasm: Secondary | ICD-10-CM | POA: Diagnosis not present

## 2018-08-03 DIAGNOSIS — D333 Benign neoplasm of cranial nerves: Secondary | ICD-10-CM | POA: Diagnosis not present

## 2018-08-03 DIAGNOSIS — D331 Benign neoplasm of brain, infratentorial: Secondary | ICD-10-CM | POA: Diagnosis not present

## 2018-08-03 DIAGNOSIS — E785 Hyperlipidemia, unspecified: Secondary | ICD-10-CM | POA: Diagnosis not present

## 2018-08-03 DIAGNOSIS — I10 Essential (primary) hypertension: Secondary | ICD-10-CM | POA: Diagnosis not present

## 2018-08-07 ENCOUNTER — Other Ambulatory Visit: Payer: Self-pay

## 2018-08-07 MED ORDER — LOSARTAN POTASSIUM 100 MG PO TABS: ORAL_TABLET | ORAL | 1 refills | Status: DC

## 2018-08-07 NOTE — Telephone Encounter (Signed)
Labs were drawn on 08/01/2018.

## 2018-08-14 DIAGNOSIS — R200 Anesthesia of skin: Secondary | ICD-10-CM | POA: Diagnosis not present

## 2018-08-14 DIAGNOSIS — R471 Dysarthria and anarthria: Secondary | ICD-10-CM | POA: Diagnosis not present

## 2018-08-14 DIAGNOSIS — R1311 Dysphagia, oral phase: Secondary | ICD-10-CM | POA: Diagnosis not present

## 2018-08-15 ENCOUNTER — Telehealth: Payer: Self-pay

## 2018-08-15 NOTE — Telephone Encounter (Signed)
Copied from [place] [id number]. Topic: General - Other >> Aug 15, 2018  3:29 PM Yvette Rack wrote: Reason for CRM: Pt stated she and Dr. Derrel Nip discussed her gaining weight and she was given some protein drinks. Pt stated she has been drinking the Ensure Plus to help her gain weight but she recently noticed that it has 39% sugar and she is concerned because she is pre-diabetic. Pt requests a call back to discuss if she should continue drinking the Ensure Plus or if there are other options to assist with helping her gain weight.

## 2018-08-15 NOTE — Telephone Encounter (Signed)
My Chart message sent

## 2018-08-29 ENCOUNTER — Telehealth: Payer: Self-pay

## 2018-08-29 DIAGNOSIS — H539 Unspecified visual disturbance: Secondary | ICD-10-CM

## 2018-08-29 NOTE — Telephone Encounter (Signed)
Copied from [place] [phone]. Topic: General - Other >> Aug 29, 2018  3:57 PM Rita Taylor, [place] C wrote: Reason for CRM: pt is requesting a referral to an eye Dr. Abbott Pao says that she had surgery because of a tumor and it effected her vision. Pt says that she was advised to request a referral from another Dr that she's seeing.   CB: [phone]

## 2018-08-31 NOTE — Addendum Note (Signed)
Addended by: Crecencio Mc on: 08/31/2018 11:30 AM   Modules accepted: Orders

## 2018-09-04 DIAGNOSIS — H168 Other keratitis: Secondary | ICD-10-CM | POA: Diagnosis not present

## 2018-09-07 DIAGNOSIS — R1311 Dysphagia, oral phase: Secondary | ICD-10-CM | POA: Diagnosis not present

## 2018-09-07 DIAGNOSIS — R471 Dysarthria and anarthria: Secondary | ICD-10-CM | POA: Diagnosis not present

## 2018-09-07 DIAGNOSIS — R200 Anesthesia of skin: Secondary | ICD-10-CM | POA: Diagnosis not present

## 2018-09-18 ENCOUNTER — Other Ambulatory Visit: Payer: Self-pay

## 2018-09-27 DIAGNOSIS — R200 Anesthesia of skin: Secondary | ICD-10-CM | POA: Diagnosis not present

## 2018-09-27 DIAGNOSIS — R471 Dysarthria and anarthria: Secondary | ICD-10-CM | POA: Diagnosis not present

## 2018-09-27 DIAGNOSIS — R1311 Dysphagia, oral phase: Secondary | ICD-10-CM | POA: Diagnosis not present

## 2018-10-02 ENCOUNTER — Other Ambulatory Visit: Payer: Self-pay

## 2018-10-02 MED ORDER — AMLODIPINE BESYLATE 2.5 MG PO TABS: ORAL_TABLET | ORAL | 1 refills | Status: DC

## 2018-10-09 DIAGNOSIS — H02154 Paralytic ectropion of left upper eyelid: Secondary | ICD-10-CM | POA: Diagnosis not present

## 2018-10-09 DIAGNOSIS — Q8502 Neurofibromatosis, type 2: Secondary | ICD-10-CM | POA: Diagnosis not present

## 2018-10-09 DIAGNOSIS — H9190 Unspecified hearing loss, unspecified ear: Secondary | ICD-10-CM | POA: Diagnosis not present

## 2018-10-13 DIAGNOSIS — Z23 Encounter for immunization: Secondary | ICD-10-CM | POA: Diagnosis not present

## 2018-10-24 ENCOUNTER — Other Ambulatory Visit: Payer: Self-pay

## 2018-10-24 ENCOUNTER — Ambulatory Visit (INDEPENDENT_AMBULATORY_CARE_PROVIDER_SITE_OTHER): Payer: Medicare Other

## 2018-10-24 DIAGNOSIS — M810 Age-related osteoporosis without current pathological fracture: Secondary | ICD-10-CM

## 2018-10-24 MED ORDER — DENOSUMAB 60 MG/ML ~~LOC~~ SOSY
60.0000 mg | PREFILLED_SYRINGE | Freq: Once | SUBCUTANEOUS | Status: AC
  Administered 2018-10-24: 60 mg via SUBCUTANEOUS

## 2018-10-24 NOTE — Progress Notes (Signed)
Patient presented for 6-month Prolia injection SQ to left arm. Patient tolerated well. 

## 2018-10-25 NOTE — Progress Notes (Signed)
  I have reviewed the above information and agree with above.   Daquana Paddock, MD 

## 2018-10-27 DIAGNOSIS — H02234 Paralytic lagophthalmos left upper eyelid: Secondary | ICD-10-CM | POA: Diagnosis not present

## 2018-10-27 DIAGNOSIS — H02154 Paralytic ectropion of left upper eyelid: Secondary | ICD-10-CM | POA: Diagnosis not present

## 2018-10-27 DIAGNOSIS — D333 Benign neoplasm of cranial nerves: Secondary | ICD-10-CM | POA: Diagnosis not present

## 2018-10-27 DIAGNOSIS — G510 Bell's palsy: Secondary | ICD-10-CM | POA: Diagnosis not present

## 2018-11-10 DIAGNOSIS — G510 Bell's palsy: Secondary | ICD-10-CM | POA: Diagnosis not present

## 2018-11-10 DIAGNOSIS — Q8502 Neurofibromatosis, type 2: Secondary | ICD-10-CM | POA: Diagnosis not present

## 2018-11-10 DIAGNOSIS — M79651 Pain in right thigh: Secondary | ICD-10-CM | POA: Diagnosis not present

## 2018-12-04 ENCOUNTER — Encounter: Payer: Self-pay | Admitting: Internal Medicine

## 2018-12-04 DIAGNOSIS — Q8502 Neurofibromatosis, type 2: Secondary | ICD-10-CM | POA: Insufficient documentation

## 2018-12-04 DIAGNOSIS — D333 Benign neoplasm of cranial nerves: Secondary | ICD-10-CM | POA: Insufficient documentation

## 2018-12-04 DIAGNOSIS — H168 Other keratitis: Secondary | ICD-10-CM | POA: Diagnosis not present

## 2018-12-12 DIAGNOSIS — H168 Other keratitis: Secondary | ICD-10-CM | POA: Diagnosis not present

## 2018-12-13 DIAGNOSIS — R1311 Dysphagia, oral phase: Secondary | ICD-10-CM | POA: Diagnosis not present

## 2018-12-13 DIAGNOSIS — R200 Anesthesia of skin: Secondary | ICD-10-CM | POA: Diagnosis not present

## 2018-12-13 DIAGNOSIS — R471 Dysarthria and anarthria: Secondary | ICD-10-CM | POA: Diagnosis not present

## 2018-12-26 DIAGNOSIS — H168 Other keratitis: Secondary | ICD-10-CM | POA: Diagnosis not present

## 2019-01-01 ENCOUNTER — Other Ambulatory Visit: Payer: Self-pay

## 2019-01-01 DIAGNOSIS — D333 Benign neoplasm of cranial nerves: Secondary | ICD-10-CM | POA: Diagnosis not present

## 2019-01-01 DIAGNOSIS — R930 Abnormal findings on diagnostic imaging of skull and head, not elsewhere classified: Secondary | ICD-10-CM | POA: Diagnosis not present

## 2019-01-01 DIAGNOSIS — E236 Other disorders of pituitary gland: Secondary | ICD-10-CM | POA: Diagnosis not present

## 2019-01-01 DIAGNOSIS — H9391 Unspecified disorder of right ear: Secondary | ICD-10-CM | POA: Diagnosis not present

## 2019-01-01 MED ORDER — LOSARTAN POTASSIUM 100 MG PO TABS: ORAL_TABLET | ORAL | 1 refills | Status: DC

## 2019-01-01 MED ORDER — PRAVASTATIN SODIUM 20 MG PO TABS: ORAL_TABLET | ORAL | 1 refills | Status: DC

## 2019-01-09 DIAGNOSIS — R471 Dysarthria and anarthria: Secondary | ICD-10-CM | POA: Diagnosis not present

## 2019-01-09 DIAGNOSIS — R1311 Dysphagia, oral phase: Secondary | ICD-10-CM | POA: Diagnosis not present

## 2019-01-09 DIAGNOSIS — R200 Anesthesia of skin: Secondary | ICD-10-CM | POA: Diagnosis not present

## 2019-01-16 DIAGNOSIS — D333 Benign neoplasm of cranial nerves: Secondary | ICD-10-CM | POA: Diagnosis not present

## 2019-01-22 DIAGNOSIS — H168 Other keratitis: Secondary | ICD-10-CM | POA: Diagnosis not present

## 2019-02-08 DIAGNOSIS — G8929 Other chronic pain: Secondary | ICD-10-CM | POA: Insufficient documentation

## 2019-02-14 ENCOUNTER — Ambulatory Visit (INDEPENDENT_AMBULATORY_CARE_PROVIDER_SITE_OTHER): Payer: Medicare Other | Admitting: Internal Medicine

## 2019-02-14 ENCOUNTER — Encounter: Payer: Self-pay | Admitting: Internal Medicine

## 2019-02-14 ENCOUNTER — Other Ambulatory Visit: Payer: Self-pay

## 2019-02-14 VITALS — Ht 63.0 in | Wt 108.0 lb

## 2019-02-14 DIAGNOSIS — D649 Anemia, unspecified: Secondary | ICD-10-CM | POA: Diagnosis not present

## 2019-02-14 DIAGNOSIS — N1330 Unspecified hydronephrosis: Secondary | ICD-10-CM | POA: Diagnosis not present

## 2019-02-14 DIAGNOSIS — M810 Age-related osteoporosis without current pathological fracture: Secondary | ICD-10-CM

## 2019-02-14 DIAGNOSIS — M5030 Other cervical disc degeneration, unspecified cervical region: Secondary | ICD-10-CM | POA: Diagnosis not present

## 2019-02-14 DIAGNOSIS — N134 Hydroureter: Secondary | ICD-10-CM | POA: Insufficient documentation

## 2019-02-14 DIAGNOSIS — R471 Dysarthria and anarthria: Secondary | ICD-10-CM | POA: Diagnosis not present

## 2019-02-14 DIAGNOSIS — R200 Anesthesia of skin: Secondary | ICD-10-CM | POA: Diagnosis not present

## 2019-02-14 DIAGNOSIS — M5136 Other intervertebral disc degeneration, lumbar region: Secondary | ICD-10-CM | POA: Insufficient documentation

## 2019-02-14 DIAGNOSIS — R1311 Dysphagia, oral phase: Secondary | ICD-10-CM | POA: Diagnosis not present

## 2019-02-14 NOTE — Assessment & Plan Note (Signed)
Etiology unclear.  CT stone protocol ordered; nephrology vs urology referral pending results

## 2019-02-14 NOTE — Assessment & Plan Note (Signed)
noted on MRI spine done by neurosurgeon at [facility]  She is asymptomatic and declines referral

## 2019-02-14 NOTE — Assessment & Plan Note (Signed)
noted on MRI spine done by neurosurgeon at [facility].  With spinal stenosis noted. . She is asymptomatic and declines referral

## 2019-02-14 NOTE — Progress Notes (Signed)
Telephone Visit   This visit type was conducted due to national recommendations for restrictions regarding the COVID-19 pandemic (e.g. social distancing).  This format is felt to be most appropriate for this patient at this time.  All issues noted in this document were discussed and addressed.  No physical exam was performed (except for noted visual exam findings with Video Visits).   I connected with@ on 02/14/19 at  3:30 PM EST by  Telephone verified that I am speaking with the correct person using two identifiers. Location patient: home Location provider: work or home office Persons participating in the virtual visit: patient, provider  I discussed the limitations, risks, security and privacy concerns of performing an evaluation and management service by telephone and the availability of in person appointments. I also discussed with the patient that there may be a patient responsible charge related to this service. The patient expressed understanding and agreed to proceed.  Reason for visit: follow up  HPI:  78 yr old with hypertension,  Prediabetes and osteoporosis here for 6 month follow up.  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  MRI spine noted degenerative disk disease and canal stenosis at  multiple levels as well as right sided hydroureteronephrosis.  Labs were normal except for mild anemia.   She has absolutely no symptoms of neck or back pain and no radiculopathy.   She has never had a kidney stone and denies any history of flank pain.  She denies fatigue, dark stools.   ROS: See pertinent positives and negatives per HPI.  Past Medical History:   Diagnosis Date  . Hypertension   . Melanoma ([place]) 2008   removed by Koleen Nimrod, Wider excision by Tamala Julian left flank    Past Surgical History:  Procedure Laterality Date  . bitubal ligation  1981  . BREAST ENHANCEMENT SURGERY  1990  . GYNECOLOGIC CRYOSURGERY     15 years ago, normal since then, was treated with antibiotics, Annual pap smears for the past 20 years    Family History  Problem Relation Age of Onset  . Hypertension Mother   . Hypertension Father   . Hypertension Brother     SOCIAL HX:  reports that she has never smoked. She has never used smokeless tobacco. She reports current alcohol use. She reports that she does not use drugs.   Current Outpatient Medications:  .  amLODipine (NORVASC) 2.5 MG tablet, TAKE 1 TABLET(2.5 MG) BY MOUTH DAILY, Disp: 90 tablet, Rfl: 1 .  Calcium Carbonate-Vit D-Min (CALCIUM 1200) 1200-1000 MG-UNIT CHEW, Chew 1 tablet by mouth daily.  , Disp: , Rfl:  .  denosumab (PROLIA) 60 MG/ML SOSY injection, Inject into the skin., Disp: , Rfl:  .  losartan (COZAAR) 100 MG tablet, TAKE 1 TABLET(100 MG) BY MOUTH DAILY, Disp: 90 tablet, Rfl: 1 .  Omega-3 Fatty Acids (FISH OIL) 1000 MG CAPS, Take 1 capsule by mouth 2 (two) times daily., Disp: , Rfl:  .  pravastatin (PRAVACHOL) 20 MG tablet, TAKE 1 TABLET(20 MG) BY MOUTH DAILY, Disp: 90 tablet, Rfl: 1 .  Turmeric 500 MG CAPS, Take 1 capsule by mouth 2 (two) times daily., Disp: , Rfl:  EXAM:   General impression: alert, cooperative and articulate.  No signs of being in distress  Lungs: speech is fluent sentence length suggests that patient is not short of breath and not punctuated by cough, sneezing or sniffing. Marland Kitchen   Psych: affect normal.  speech is articulate and non pressured .  Denies suicidal thoughts    ASSESSMENT AND PLAN:  Discussed the following assessment and plan:  Anemia, unspecified type - Plan: CBC with Differential/Platelet, TSH, Iron, TIBC and Ferritin Panel, B12 and Folate Panel, Fecal  occult blood, imunochemical, Comprehensive metabolic panel  Hydronephrosis, unspecified hydronephrosis type - Plan: CT RENAL STONE STUDY, Comprehensive metabolic panel  Hydroureter - Plan: CT RENAL STONE STUDY  Degenerative disc disease, cervical  Degenerative disc disease, lumbar  Osteoporosis, postmenopausal - Plan: DG Bone Density  Anemia HGB 10.6  DROPPED FROM 12.9 IN June.  ETIOLOGY UNCLEAR.  IRON B1, FOLATE AND THYROID AS WELL AS FOBT ORDERED.  DUE FOR 5 YR FOLLOW UP COLONOSCOPY  Hydronephrosis Etiology unclear.  CT stone protocol ordered; nephrology vs urology referral pending results   Degenerative disc disease, cervical noted on MRI spine done by neurosurgeon at [facility]  She is asymptomatic and declines referral   Degenerative disc disease, lumbar noted on MRI spine done by neurosurgeon at [facility].  With spinal stenosis noted. . She is asymptomatic and declines referral    Osteoporosis, postmenopausal With a  history of radial fracture. Diagnosed in 2012  and treated with alendronate until she developed hives and edema and stopped the medication in 2014.   T scores improved on alendronate.  Has been tolerating Prolia for the past 3  Years .  DEXA done in 2017 year shows a SS increase since 2015.  Last injection Sept 2020 , repeat DEXA due     I discussed the assessment and treatment plan with the patient. The patient was provided an opportunity to ask questions and all were answered. The patient agreed with the plan and demonstrated an understanding of the instructions.   The patient was advised to call back or seek an in-person evaluation if the symptoms worsen or if the condition fails to improve as anticipated.  I provided 30 minutes of non-face-to-face time during this encounter.   Crecencio Mc, MD

## 2019-02-14 NOTE — Assessment & Plan Note (Signed)
HGB 10.6  DROPPED FROM 12.9 IN June.  ETIOLOGY UNCLEAR.  IRON B1, FOLATE AND THYROID AS WELL AS FOBT ORDERED.  DUE FOR 5 YR FOLLOW UP COLONOSCOPY

## 2019-02-16 NOTE — Assessment & Plan Note (Addendum)
With a  history of radial fracture. Diagnosed in 2012  and treated with alendronate until she developed hives and edema and stopped the medication in 2014.   T scores improved on alendronate.  Has been tolerating Prolia for the past 3  Years .  DEXA done in 2017 year shows a SS increase since 2015.  Last injection Sept 2020 , repeat DEXA due

## 2019-02-22 DIAGNOSIS — H02115 Cicatricial ectropion of left lower eyelid: Secondary | ICD-10-CM | POA: Diagnosis not present

## 2019-02-23 ENCOUNTER — Telehealth: Payer: Self-pay | Admitting: Internal Medicine

## 2019-02-23 NOTE — Telephone Encounter (Signed)
lvm for pt to rtc. CT scheduled Friday Jan 15 at 3 pm, she will need to arrive by 245 for check in at [facility] located at [place]. If she needs to reschedule she can call [phone] option 3, option 2. Pt has read her mychart message regarding this appt.

## 2019-02-26 ENCOUNTER — Encounter: Payer: Self-pay | Admitting: Ophthalmology

## 2019-02-26 ENCOUNTER — Other Ambulatory Visit: Payer: Self-pay

## 2019-02-28 ENCOUNTER — Other Ambulatory Visit
Admission: RE | Admit: 2019-02-28 | Discharge: 2019-02-28 | Disposition: A | Discharge status: Home / Self Care | Payer: Medicare Other | Source: Ambulatory Visit | Attending: Ophthalmology | Admitting: Ophthalmology

## 2019-02-28 ENCOUNTER — Other Ambulatory Visit: Payer: Self-pay

## 2019-02-28 DIAGNOSIS — Z01812 Encounter for preprocedural laboratory examination: Secondary | ICD-10-CM | POA: Diagnosis not present

## 2019-02-28 DIAGNOSIS — Z20822 Contact with and (suspected) exposure to covid-19: Secondary | ICD-10-CM | POA: Diagnosis not present

## 2019-02-28 LAB — SARS CORONAVIRUS 2 (TAT 6-24 HRS): SARS Coronavirus 2: NEGATIVE

## 2019-03-02 ENCOUNTER — Ambulatory Visit: Payer: Medicare Other | Admitting: Anesthesiology

## 2019-03-02 ENCOUNTER — Encounter: Payer: Self-pay | Admitting: Ophthalmology

## 2019-03-02 ENCOUNTER — Encounter
Admission: RE | Disposition: A | Discharge status: Home / Self Care | Payer: Self-pay | Source: Home / Self Care | Attending: Ophthalmology

## 2019-03-02 ENCOUNTER — Ambulatory Visit: Payer: Medicare Other

## 2019-03-02 ENCOUNTER — Ambulatory Visit
Admission: RE | Admit: 2019-03-02 | Discharge: 2019-03-02 | Disposition: A | Discharge status: Home / Self Care | Payer: Medicare Other | Attending: Ophthalmology | Admitting: Ophthalmology

## 2019-03-02 ENCOUNTER — Other Ambulatory Visit: Payer: Self-pay

## 2019-03-02 DIAGNOSIS — H02155 Paralytic ectropion of left lower eyelid: Secondary | ICD-10-CM | POA: Diagnosis not present

## 2019-03-02 DIAGNOSIS — E7800 Pure hypercholesterolemia, unspecified: Secondary | ICD-10-CM | POA: Insufficient documentation

## 2019-03-02 DIAGNOSIS — Z79899 Other long term (current) drug therapy: Secondary | ICD-10-CM | POA: Diagnosis not present

## 2019-03-02 DIAGNOSIS — H02235 Paralytic lagophthalmos left lower eyelid: Secondary | ICD-10-CM | POA: Insufficient documentation

## 2019-03-02 DIAGNOSIS — H189 Unspecified disorder of cornea: Secondary | ICD-10-CM | POA: Diagnosis not present

## 2019-03-02 DIAGNOSIS — Z85820 Personal history of malignant melanoma of skin: Secondary | ICD-10-CM | POA: Insufficient documentation

## 2019-03-02 DIAGNOSIS — H02115 Cicatricial ectropion of left lower eyelid: Secondary | ICD-10-CM | POA: Diagnosis not present

## 2019-03-02 DIAGNOSIS — Z888 Allergy status to other drugs, medicaments and biological substances status: Secondary | ICD-10-CM | POA: Insufficient documentation

## 2019-03-02 DIAGNOSIS — I10 Essential (primary) hypertension: Secondary | ICD-10-CM | POA: Diagnosis not present

## 2019-03-02 HISTORY — DX: Personal history of other infectious and parasitic diseases: Z86.19

## 2019-03-02 HISTORY — DX: Hyperlipidemia, unspecified: E78.5

## 2019-03-02 HISTORY — PX: ECTROPION REPAIR: SHX357

## 2019-03-02 HISTORY — DX: Benign neoplasm of cranial nerves: D33.3

## 2019-03-02 SURGERY — REPAIR, ECTROPION, EYELID
Anesthesia: Monitor Anesthesia Care | Site: Eye | Laterality: Left

## 2019-03-02 MED ORDER — ERYTHROMYCIN 5 MG/GM OP OINT: TOPICAL_OINTMENT | OPHTHALMIC | 2 refills | Status: DC

## 2019-03-02 MED ORDER — ALFENTANIL 500 MCG/ML IJ INJ
INJECTION | INTRAVENOUS | Status: DC | PRN
  Administered 2019-03-02 (×2): 250 ug via INTRAVENOUS

## 2019-03-02 MED ORDER — TRAMADOL HCL 50 MG PO TABS: ORAL_TABLET | ORAL | 0 refills | Status: DC

## 2019-03-02 MED ORDER — TETRACAINE HCL 0.5 % OP SOLN
OPHTHALMIC | Status: DC | PRN
  Administered 2019-03-02: 1 [drp] via OPHTHALMIC

## 2019-03-02 MED ORDER — DEXMEDETOMIDINE HCL 200 MCG/2ML IV SOLN
INTRAVENOUS | Status: DC | PRN
  Administered 2019-03-02: 10 ug via INTRAVENOUS

## 2019-03-02 MED ORDER — BSS IO SOLN
INTRAOCULAR | Status: DC | PRN
  Administered 2019-03-02: 15 mL via INTRAOCULAR

## 2019-03-02 MED ORDER — MIDAZOLAM HCL 2 MG/2ML IJ SOLN
INTRAMUSCULAR | Status: DC | PRN
  Administered 2019-03-02: 2 mg via INTRAVENOUS

## 2019-03-02 MED ORDER — LIDOCAINE-EPINEPHRINE 2 %-1:100000 IJ SOLN
INTRAMUSCULAR | Status: DC | PRN
  Administered 2019-03-02: 2.5 mL via OPHTHALMIC

## 2019-03-02 MED ORDER — ERYTHROMYCIN 5 MG/GM OP OINT
TOPICAL_OINTMENT | OPHTHALMIC | Status: DC | PRN
  Administered 2019-03-02: 1 via OPHTHALMIC

## 2019-03-02 MED ORDER — LACTATED RINGERS IV SOLN: INTRAVENOUS | Status: DC

## 2019-03-02 SURGICAL SUPPLY — 24 items
APPLICATOR COTTON TIP WD 3 STR (MISCELLANEOUS) ×6 IMPLANT
BLADE SURG 15 STRL LF DISP TIS (BLADE) ×1 IMPLANT
BLADE SURG 15 STRL SS (BLADE) ×2
CORD BIP STRL DISP 12FT (MISCELLANEOUS) ×3 IMPLANT
Curity all purpose sponges 2x2 ×20 IMPLANT
DRAPE HEAD BAR (DRAPES) ×3 IMPLANT
GAUZE SPONGE 4X4 12PLY STRL (GAUZE/BANDAGES/DRESSINGS) ×3 IMPLANT
GLOVE SURG LX 7.0 MICRO (GLOVE) ×4
GLOVE SURG LX STRL 7.0 MICRO (GLOVE) ×2 IMPLANT
MARKER SKIN XFINE TIP W/RULER (MISCELLANEOUS) ×3 IMPLANT
NDL FILTER BLUNT 18X1 1/2 (NEEDLE) ×1 IMPLANT
NDL HYPO 30X.5 LL (NEEDLE) ×2 IMPLANT
NEEDLE FILTER BLUNT 18X 1/2SAF (NEEDLE) ×2
NEEDLE FILTER BLUNT 18X1 1/2 (NEEDLE) ×1 IMPLANT
NEEDLE HYPO 30X.5 LL (NEEDLE) ×6 IMPLANT
PACK ENT CUSTOM (PACKS) ×3 IMPLANT
SOL PREP PVP 2OZ (MISCELLANEOUS) ×3
SOLUTION PREP PVP 2OZ (MISCELLANEOUS) ×1 IMPLANT
SUT MERSILENE 4-0 S-2 (SUTURE) ×5 IMPLANT
SUT VICRYL 6-0  S14 CTD (SUTURE) ×2
SUT VICRYL 6-0 S14 CTD (SUTURE) IMPLANT
SYR 10ML LL (SYRINGE) ×3 IMPLANT
SYR 3ML LL SCALE MARK (SYRINGE) ×3 IMPLANT
WATER STERILE IRR 250ML POUR (IV SOLUTION) ×3 IMPLANT

## 2019-03-02 NOTE — Anesthesia Postprocedure Evaluation (Signed)
Anesthesia Post Note  Patient: Rita Taylor  Procedure(s) Performed: ECTROPION REPAIR, EXTENSIVE AND TARSORRHAPHY, LATERAL PLACEMENT OF LEFT LOWER LID (Left Eye)     Patient location during evaluation: PACU Anesthesia Type: MAC Level of consciousness: awake Pain management: pain level controlled Vital Signs Assessment: post-procedure vital signs reviewed and stable Respiratory status: respiratory function stable Cardiovascular status: stable Postop Assessment: no apparent nausea or vomiting Anesthetic complications: no    Veda Canning

## 2019-03-02 NOTE — Op Note (Signed)
Preoperative Diagnosis:   1.  Lower eyelid laxity with paralytic ectropion, left  lower eyelid(s). 2.  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  Procedure(s) Performed:  1.  Lateral tarsal strip procedure,  left  lower eyelid(s). 2.  Tarsorrhaphy with inner marginal adhesions left eye  Teaching Surgeon: Philis Pique. Vickki Muff, M.D.  Assistants: none  Anesthesia: MAC  Specimens: None.  Estimated Blood Loss: Minimal.  Complications: None.  Operative Findings: None   Procedure:   Allergies were reviewed and the patient Fosamax [alendronate], Pseudoephedrine-[id number], and Risedronate sodium.    After discussing the risks, benefits, complications, and alternatives with the patient, appropriate informed consent was obtained. The patient was brought to the operating suite and reclined supine. Time out was conducted and the patient was sedated.  Local anesthetic consisting of a 50-50 mixture of 2% lidocaine with epinephrine 0.75% bupivacaine and added Hylenex was injected subcutaneously to the left lateral canthal region(s), lower and upper eyelid(s). Additional anesthetic was injected subconjunctivally to the left upper and lower eyelid(s). Finally, anesthetic was injected down to the periosteum of the left lateral orbital rim(s).  After adequate local was instilled, the patient was prepped and draped in the usual sterile fashion for eyelid surgery. Attention was turned to the left lateral canthal angle. Westcott scissors were used to create a lateral canthotomy. Hemostasis was obtained with bipolar cautery. An inferior cantholysis was then performed with additional bipolar hemostasis. The anterior and posterior lamella of the lid were divided for approximately 8 mm.  A strip of the epithelium was excised off the superior margin of the tarsal strip and conjunctiva and retractors were incised off the inferior margin of the tarsal strip. A double-armed 4-0  Mersilene suture was then passed each arm through the terminal portion of the tarsal strip. Each arm of the suture was then passed through the periosteum of the inner portion of the lateral orbital rim at the level of Whitnall's tubercle. The sutures were advanced and this provided nice elevation and tightening of the lower eyelid.   Attention was turned to the left upper eyelid.  The anterior and posterior lamella were divided for approximately 6 mm.  A strip of epithelium was excised off the margin of the tarsus.  2 interrupted 6-0 Vicryl sutures were passed partial thickness to secure the upper tarsus to the lower tarsus.  A thin strip of follicle-bearing skin was excised from the lower eyelid skin. The lateral canthal angle was reformed with an interrupted 6-0 Vicryl suture.  The inferior cut edge of orbicularis was fixed to the periosteum of the lateral orbital rim with a 6-0 Vicryl mattress suture.  Orbicularis was reapproximated with horizontal subcuticular 6-0 Vicryl sutures. The skin was closed with interrupted 6-0 Vicryl sutures.  The patient tolerated the procedure well.  Erythromycin ophthalmic ointment was applied to the incision site(s) followed by ice packs. The patient was taken to the recovery area where she recovered without difficulty.  Post-Op Plan/Instructions:  The patient was instructed to use ice packs frequently for the next 48 hours. She was instructed to use erythromycin ophthalmic ointment on her incisions 4 times a day for the next 12 to 14 days. She was given a prescription for Percocet for pain control should Tylenol not be effective. She was asked to to follow up at the [facility] in [place], [place] in 2 weeks' time or sooner as needed for problems.  Teaching Surgeon Attestation: None  Tomeshia Pizzi M. Vickki Muff, M.D. Attending,Ophthalmology

## 2019-03-02 NOTE — Transfer of Care (Signed)
Immediate Anesthesia Transfer of Care Note  Patient: Rita Taylor  Procedure(s) Performed: ECTROPION REPAIR, EXTENSIVE AND TARSORRHAPHY, LATERAL PLACEMENT OF LEFT LOWER LID (Left Eye)  Patient Location: PACU  Anesthesia Type: MAC  Level of Consciousness: awake, alert  and patient cooperative  Airway and Oxygen Therapy: Patient Spontanous Breathing and Patient connected to supplemental oxygen  Post-op Assessment: Post-op Vital signs reviewed, Patient's Cardiovascular Status Stable, Respiratory Function Stable, Patent Airway and No signs of Nausea or vomiting  Post-op Vital Signs: Reviewed and stable  Complications: No apparent anesthesia complications

## 2019-03-02 NOTE — Anesthesia Procedure Notes (Signed)
Date/Time: 03/02/2019 1:05 PM Performed by: Cameron Ali, CRNA Pre-anesthesia Checklist: Patient identified, Emergency Drugs available, Suction available, Timeout performed and Patient being monitored Patient Re-evaluated:Patient Re-evaluated prior to induction Oxygen Delivery Method: Nasal cannula Placement Confirmation: positive ETCO2

## 2019-03-02 NOTE — H&P (Signed)
See the history and physical completed at [facility] on 02/23/2019 and scanned into the chart.

## 2019-03-02 NOTE — Anesthesia Preprocedure Evaluation (Signed)
Anesthesia Evaluation  Patient identified by MRN, date of birth, ID band Patient awake    Reviewed: Allergy & Precautions, NPO status   Airway Mallampati: II  TM Distance: >3 FB     Dental   Pulmonary neg pulmonary ROS,    breath sounds clear to auscultation       Cardiovascular hypertension,  Rhythm:Regular Rate:Normal  HLD   Neuro/Psych    GI/Hepatic   Endo/Other    Renal/GU      Musculoskeletal   Abdominal   Peds  Hematology  (+) anemia ,   Anesthesia Other Findings   Reproductive/Obstetrics                             Anesthesia Physical Anesthesia Plan  ASA: II  Anesthesia Plan: MAC   Post-op Pain Management:    Induction: Intravenous  PONV Risk Score and Plan: Midazolam and TIVA  Airway Management Planned:   Additional Equipment:   Intra-op Plan:   Post-operative Plan:   Informed Consent: I have reviewed the patients History and Physical, chart, labs and discussed the procedure including the risks, benefits and alternatives for the proposed anesthesia with the patient or authorized representative who has indicated his/her understanding and acceptance.       Plan Discussed with: CRNA  Anesthesia Plan Comments:         Anesthesia Quick Evaluation

## 2019-03-02 NOTE — Discharge Instructions (Signed)
INSTRUCTIONS FOLLOWING OCULOPLASTIC SURGERY AMY Dennie Maizes, MD  AFTER YOUR EYE SURGERY, THER ARE MANY THINGS WHICH YOU, THE PATIENT, CAN DO TO ASSURE THE BEST POSSIBLE RESULT FROM YOUR OPERATION.  THIS SHEET SHOULD BE REFERRED TO WHENEVER QUESTIONS ARISE.  IF THERE ARE ANY QUESTIONS NOT ANSWERED HERE, DO NOT HESITATE TO CALL OUR OFFICE AT [phone] OR [phone].  THERE IS ALWAYS SOMEONE AVAILABLE TO CALL IF QUESTIONS OR PROBLEMS ARISE.  VISION: Your vision may be blurred and out of focus after surgery until you are able to stop using your ointment, swelling resolves and your eye(s) heal. This may take 1 to 2 weeks at the least.  If your vision becomes gradually more dim or dark, this is not normal and you need to call our office immediately.  EYE CARE: For the first 48 hours after surgery, use ice packs frequently - 20 minutes on, 20 minutes off - to help reduce swelling and bruising.  Small bags of frozen peas or corn make good ice packs along with cloths soaked in ice water.  If you are wearing a patch or other type of dressing following surgery, keep this on for the amount of time specified by your doctor.  For the first week following surgery, you will need to treat your stitches with great care.  It is OK to shower, but take care to not allow soapy water to run into your eye(s) to help reduce chances of infection.  You may gently clean the eyelashes and around the eye(s) with cotton balls and sterile water, BUT DO NOT RUB THE STITCHES VIGOROUSLY.  Keeping your stitches moist with ointment will help promote healing with minimal scar formation.  ACTIVITY: When you leave the surgery center, you should go home, rest and be inactive.  The eye(s) may feel scratchy and keeping the eyes closed will allow for faster healing.  The first week following surgery, avoid straining (anything making the face turn red) or lifting over 20 pounds.  Additionally, avoid bending which causes your head to go below  your waist.  Using your eyes will NOT harm them, so feel free to read, watch television, use the computer, etc as desired.  Driving depends on each individual, so check with your doctor if you have questions about driving. Do not wear contact lenses for about 2 weeks.  Do not wear eye makeup for 2 weeks.  Avoid swimming, hot tubs, gardening, and dusting for 1 to 2 weeks to reduce the risk of an infection.  MEDICATIONS:  You will be given a prescription for an ointment to use 4 times a day on your stitches.  You can use the ointment in your eyes if they feel scratchy or irritated.  If you eyelid(s) dont close completely when you sleep, put some ointment in your eyes before bedtime.  EMERGENCY: If you experience SEVERE EYE PAIN OR HEADACHE UNRELIEVED BY TYLENOL OR PERCOCET, NAUSEA OR VOMITING, WORSENING REDNESS, OR WORSENING VISION (ESPECIALLY VISION THAT WAS INITIALLY BETTER) CALL [phone] OR [phone] DURING BUSINESS HOURS OR AFTER HOURS.INSTRUCTIONS FOLLOWING OCULOPLASTIC SURGERY AMY Dennie Maizes, MD  AFTER YOUR EYE SURGERY, THER ARE MANY THINGS WHICH YOU, THE PATIENT, CAN DO TO ASSURE THE BEST POSSIBLE RESULT FROM YOUR OPERATION.  THIS SHEET SHOULD BE REFERRED TO WHENEVER QUESTIONS ARISE.  IF THERE ARE ANY QUESTIONS NOT ANSWERED HERE, DO NOT HESITATE TO CALL OUR OFFICE AT [phone] OR [phone].  THERE IS ALWAYS SOMEONE AVAILABLE TO CALL IF QUESTIONS OR PROBLEMS ARISE.  VISION: Your  vision may be blurred and out of focus after surgery until you are able to stop using your ointment, swelling resolves and your eye(s) heal. This may take 1 to 2 weeks at the least.  If your vision becomes gradually more dim or dark, this is not normal and you need to call our office immediately.  EYE CARE: For the first 48 hours after surgery, use ice packs frequently - 20 minutes on, 20 minutes off - to help reduce swelling and bruising.  Small bags of frozen peas or corn make good ice packs along with  cloths soaked in ice water.  If you are wearing a patch or other type of dressing following surgery, keep this on for the amount of time specified by your doctor.  For the first week following surgery, you will need to treat your stitches with great care.  It is OK to shower, but take care to not allow soapy water to run into your eye(s) to help reduce chances of infection.  You may gently clean the eyelashes and around the eye(s) with cotton balls and sterile water, BUT DO NOT RUB THE STITCHES VIGOROUSLY.  Keeping your stitches moist with ointment will help promote healing with minimal scar formation.  ACTIVITY: When you leave the surgery center, you should go home, rest and be inactive.  The eye(s) may feel scratchy and keeping the eyes closed will allow for faster healing.  The first week following surgery, avoid straining (anything making the face turn red) or lifting over 20 pounds.  Additionally, avoid bending which causes your head to go below your waist.  Using your eyes will NOT harm them, so feel free to read, watch television, use the computer, etc as desired.  Driving depends on each individual, so check with your doctor if you have questions about driving. Do not wear contact lenses for about 2 weeks.  Do not wear eye makeup for 2 weeks.  Avoid swimming, hot tubs, gardening, and dusting for 1 to 2 weeks to reduce the risk of an infection.  MEDICATIONS:  You will be given a prescription for an ointment to use 4 times a day on your stitches.  You can use the ointment in your eyes if they feel scratchy or irritated.  If you eyelid(s) dont close completely when you sleep, put some ointment in your eyes before bedtime.  EMERGENCY: If you experience SEVERE EYE PAIN OR HEADACHE UNRELIEVED BY TYLENOL OR PERCOCET, NAUSEA OR VOMITING, WORSENING REDNESS, OR WORSENING VISION (ESPECIALLY VISION THAT WAS INITIALLY BETTER) CALL [phone] OR [phone] DURING BUSINESS HOURS OR AFTER HOURS.General  Anesthesia, Adult, Care After This sheet gives you information about how to care for yourself after your procedure. Your health care provider may also give you more specific instructions. If you have problems or questions, contact your health care provider. What can I expect after the procedure? After the procedure, the following side effects are common:  Pain or discomfort at the IV site.  Nausea.  Vomiting.  Sore throat.  Trouble concentrating.  Feeling cold or chills.  Weak or tired.  Sleepiness and fatigue.  Soreness and body aches. These side effects can affect parts of the body that were not involved in surgery. Follow these instructions at home:  For at least 24 hours after the procedure:  Have a responsible adult stay with you. It is important to have someone help care for you until you are awake and alert.  Rest as needed.  Do not: ? Participate  in activities in which you could fall or become injured. ? Drive. ? Use heavy machinery. ? Drink alcohol. ? Take sleeping pills or medicines that cause drowsiness. ? Make important decisions or sign legal documents. ? Take care of children on your own. Eating and drinking  Follow any instructions from your health care provider about eating or drinking restrictions.  When you feel hungry, start by eating small amounts of foods that are soft and easy to digest (bland), such as toast. Gradually return to your regular diet.  Drink enough fluid to keep your urine pale yellow.  If you vomit, rehydrate by drinking water, juice, or clear broth. General instructions  If you have sleep apnea, surgery and certain medicines can increase your risk for breathing problems. Follow instructions from your health care provider about wearing your sleep device: ? Anytime you are sleeping, including during daytime naps. ? While taking prescription pain medicines, sleeping medicines, or medicines that make you drowsy.  Return to your  normal activities as told by your health care provider. Ask your health care provider what activities are safe for you.  Take over-the-counter and prescription medicines only as told by your health care provider.  If you smoke, do not smoke without supervision.  Keep all follow-up visits as told by your health care provider. This is important. Contact a health care provider if:  You have nausea or vomiting that does not get better with medicine.  You cannot eat or drink without vomiting.  You have pain that does not get better with medicine.  You are unable to pass urine.  You develop a skin rash.  You have a fever.  You have redness around your IV site that gets worse. Get help right away if:  You have difficulty breathing.  You have chest pain.  You have blood in your urine or stool, or you vomit blood. Summary  After the procedure, it is common to have a sore throat or nausea. It is also common to feel tired.  Have a responsible adult stay with you for the first 24 hours after general anesthesia. It is important to have someone help care for you until you are awake and alert.  When you feel hungry, start by eating small amounts of foods that are soft and easy to digest (bland), such as toast. Gradually return to your regular diet.  Drink enough fluid to keep your urine pale yellow.  Return to your normal activities as told by your health care provider. Ask your health care provider what activities are safe for you. This information is not intended to replace advice given to you by your health care provider. Make sure you discuss any questions you have with your health care provider. Document Revised: 02/04/2017 Document Reviewed: 09/17/2016 Elsevier Patient Education  [place].

## 2019-03-02 NOTE — Interval H&P Note (Signed)
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  The various methods of treatment have been discussed with the patient and family. After consideration of risks, benefits and other options for treatment, the patient has consented to  Procedure(s): ECTROPION REPAIR, EXTENSIVE AND TARSORRHAPHY, LATERAL PLACEMENT OF LEFT LOWER LID (Left) as a surgical intervention.  The patient's history has been reviewed, patient examined, no change in status, stable for surgery.  I have reviewed the patient's chart and labs.  Questions were answered to the patient's satisfaction.     Vickki Muff, Manpreet Strey M

## 2019-03-05 ENCOUNTER — Encounter: Payer: Self-pay | Admitting: *Deleted

## 2019-03-09 ENCOUNTER — Ambulatory Visit
Admission: RE | Admit: 2019-03-09 | Discharge: 2019-03-09 | Disposition: A | Discharge status: Home / Self Care | Payer: Medicare Other | Source: Ambulatory Visit | Attending: Internal Medicine | Admitting: Internal Medicine

## 2019-03-09 ENCOUNTER — Other Ambulatory Visit: Payer: Self-pay

## 2019-03-09 DIAGNOSIS — N134 Hydroureter: Secondary | ICD-10-CM | POA: Diagnosis not present

## 2019-03-09 DIAGNOSIS — N1330 Unspecified hydronephrosis: Secondary | ICD-10-CM

## 2019-03-13 DIAGNOSIS — M533 Sacrococcygeal disorders, not elsewhere classified: Secondary | ICD-10-CM | POA: Diagnosis not present

## 2019-03-13 DIAGNOSIS — M25551 Pain in right hip: Secondary | ICD-10-CM | POA: Diagnosis not present

## 2019-03-14 ENCOUNTER — Encounter: Payer: Self-pay | Admitting: Internal Medicine

## 2019-03-30 ENCOUNTER — Other Ambulatory Visit: Payer: Self-pay

## 2019-03-30 MED ORDER — AMLODIPINE BESYLATE 2.5 MG PO TABS: ORAL_TABLET | ORAL | 1 refills | Status: DC

## 2019-04-02 DIAGNOSIS — R471 Dysarthria and anarthria: Secondary | ICD-10-CM | POA: Diagnosis not present

## 2019-04-02 DIAGNOSIS — R200 Anesthesia of skin: Secondary | ICD-10-CM | POA: Diagnosis not present

## 2019-04-02 DIAGNOSIS — R1311 Dysphagia, oral phase: Secondary | ICD-10-CM | POA: Diagnosis not present

## 2019-04-26 DIAGNOSIS — D2261 Melanocytic nevi of right upper limb, including shoulder: Secondary | ICD-10-CM | POA: Diagnosis not present

## 2019-04-26 DIAGNOSIS — Z08 Encounter for follow-up examination after completed treatment for malignant neoplasm: Secondary | ICD-10-CM | POA: Diagnosis not present

## 2019-04-26 DIAGNOSIS — D2262 Melanocytic nevi of left upper limb, including shoulder: Secondary | ICD-10-CM | POA: Diagnosis not present

## 2019-04-26 DIAGNOSIS — Z85820 Personal history of malignant melanoma of skin: Secondary | ICD-10-CM | POA: Diagnosis not present

## 2019-04-26 DIAGNOSIS — D2271 Melanocytic nevi of right lower limb, including hip: Secondary | ICD-10-CM | POA: Diagnosis not present

## 2019-04-26 DIAGNOSIS — D225 Melanocytic nevi of trunk: Secondary | ICD-10-CM | POA: Diagnosis not present

## 2019-05-02 DIAGNOSIS — R471 Dysarthria and anarthria: Secondary | ICD-10-CM | POA: Diagnosis not present

## 2019-05-02 DIAGNOSIS — R200 Anesthesia of skin: Secondary | ICD-10-CM | POA: Diagnosis not present

## 2019-05-02 DIAGNOSIS — R1311 Dysphagia, oral phase: Secondary | ICD-10-CM | POA: Diagnosis not present

## 2019-05-08 DIAGNOSIS — H168 Other keratitis: Secondary | ICD-10-CM | POA: Diagnosis not present

## 2019-05-09 DIAGNOSIS — H168 Other keratitis: Secondary | ICD-10-CM | POA: Diagnosis not present

## 2019-05-24 DIAGNOSIS — H02834 Dermatochalasis of left upper eyelid: Secondary | ICD-10-CM | POA: Diagnosis not present

## 2019-05-25 DIAGNOSIS — H02234 Paralytic lagophthalmos left upper eyelid: Secondary | ICD-10-CM | POA: Diagnosis not present

## 2019-05-25 DIAGNOSIS — R200 Anesthesia of skin: Secondary | ICD-10-CM | POA: Diagnosis not present

## 2019-05-25 DIAGNOSIS — G510 Bell's palsy: Secondary | ICD-10-CM | POA: Diagnosis not present

## 2019-05-25 DIAGNOSIS — E7800 Pure hypercholesterolemia, unspecified: Secondary | ICD-10-CM | POA: Diagnosis not present

## 2019-05-25 DIAGNOSIS — D333 Benign neoplasm of cranial nerves: Secondary | ICD-10-CM | POA: Diagnosis not present

## 2019-05-25 DIAGNOSIS — Z79899 Other long term (current) drug therapy: Secondary | ICD-10-CM | POA: Diagnosis not present

## 2019-05-25 DIAGNOSIS — I10 Essential (primary) hypertension: Secondary | ICD-10-CM | POA: Diagnosis not present

## 2019-06-14 DIAGNOSIS — H02432 Paralytic ptosis of left eyelid: Secondary | ICD-10-CM | POA: Diagnosis not present

## 2019-07-03 ENCOUNTER — Other Ambulatory Visit: Payer: Self-pay

## 2019-07-03 MED ORDER — LOSARTAN POTASSIUM 100 MG PO TABS: ORAL_TABLET | ORAL | 1 refills | Status: DC

## 2019-07-03 MED ORDER — PRAVASTATIN SODIUM 20 MG PO TABS: ORAL_TABLET | ORAL | 1 refills | Status: DC

## 2019-08-17 ENCOUNTER — Encounter: Payer: Self-pay | Admitting: Internal Medicine

## 2019-08-17 ENCOUNTER — Ambulatory Visit (INDEPENDENT_AMBULATORY_CARE_PROVIDER_SITE_OTHER): Payer: Medicare Other | Admitting: Internal Medicine

## 2019-08-17 ENCOUNTER — Other Ambulatory Visit: Payer: Self-pay

## 2019-08-17 VITALS — BP 144/74 | HR 66 | Temp 97.4°F | Resp 15 | Ht 62.5 in | Wt 111.8 lb

## 2019-08-17 DIAGNOSIS — Z1211 Encounter for screening for malignant neoplasm of colon: Secondary | ICD-10-CM

## 2019-08-17 DIAGNOSIS — D333 Benign neoplasm of cranial nerves: Secondary | ICD-10-CM

## 2019-08-17 DIAGNOSIS — M810 Age-related osteoporosis without current pathological fracture: Secondary | ICD-10-CM

## 2019-08-17 DIAGNOSIS — R7303 Prediabetes: Secondary | ICD-10-CM

## 2019-08-17 DIAGNOSIS — I10 Essential (primary) hypertension: Secondary | ICD-10-CM | POA: Diagnosis not present

## 2019-08-17 DIAGNOSIS — E7800 Pure hypercholesterolemia, unspecified: Secondary | ICD-10-CM | POA: Diagnosis not present

## 2019-08-17 DIAGNOSIS — E559 Vitamin D deficiency, unspecified: Secondary | ICD-10-CM | POA: Diagnosis not present

## 2019-08-17 DIAGNOSIS — I2510 Atherosclerotic heart disease of native coronary artery without angina pectoris: Secondary | ICD-10-CM

## 2019-08-17 DIAGNOSIS — Q8502 Neurofibromatosis, type 2: Secondary | ICD-10-CM

## 2019-08-17 DIAGNOSIS — I700 Atherosclerosis of aorta: Secondary | ICD-10-CM

## 2019-08-17 DIAGNOSIS — R918 Other nonspecific abnormal finding of lung field: Secondary | ICD-10-CM

## 2019-08-17 LAB — COMPREHENSIVE METABOLIC PANEL
ALT: 15 U/L (ref 0–35)
AST: 18 U/L (ref 0–37)
Albumin: 4.5 g/dL (ref 3.5–5.2)
Alkaline Phosphatase: 57 U/L (ref 39–117)
BUN: 17 mg/dL (ref 6–23)
CO2: 32 mEq/L (ref 19–32)
Calcium: 9.8 mg/dL (ref 8.4–10.5)
Chloride: 103 mEq/L (ref 96–112)
Creatinine, Ser: 0.75 mg/dL (ref 0.40–1.20)
GFR: 74.4 mL/min (ref >60.00)
Glucose, Bld: 100 mg/dL — ABNORMAL HIGH (ref 70–99)
Potassium: 4.6 mEq/L (ref 3.5–5.1)
Sodium: 141 mEq/L (ref 135–145)
Total Bilirubin: 0.7 mg/dL (ref 0.2–1.2)
Total Protein: 6.5 g/dL (ref 6.0–8.3)

## 2019-08-17 LAB — HEMOGLOBIN A1C: Hgb A1c MFr Bld: 6.2 % (ref 4.6–6.5)

## 2019-08-17 LAB — LIPID PANEL
Cholesterol: 184 mg/dL (ref 0–200)
HDL: 80.5 mg/dL (ref >39.00)
LDL Cholesterol: 90 mg/dL (ref 0–99)
NonHDL: 103.28
Total CHOL/HDL Ratio: 2
Triglycerides: 65 mg/dL (ref 0.0–149.0)
VLDL: 13 mg/dL (ref 0.0–40.0)

## 2019-08-17 LAB — VITAMIN D 25 HYDROXY (VIT D DEFICIENCY, FRACTURES): VITD: 39.32 ng/mL (ref 30.00–100.00)

## 2019-08-17 NOTE — Progress Notes (Signed)
Subjective:  Patient ID: Rita Taylor, female    DOB: 1940/03/30  Age: 79 y.o. MRN: [id number]  CC: The primary encounter diagnosis was Pure hypercholesterolemia. 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  HPI Rita Taylor presents for follow up on hypertension , hyperlipidemia and prediabetes.  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  Left eye is Tearing a lot. Tried getting the stitches in the corner of her eye which provided 4 months of relief.   Left side of face still sagging after one year,  Disappointed   Patient is taking her medications as prescribed and notes no adverse effects.  Home BP readings have been done about once per week and are always < 140/80 .  She is avoiding added salt in her diet and walking regularly about 3 times per week for exercise .  CAD and aortic atherosclerosis noted on Jan 2021 CT renal stone study  Reviewed today.  She is tolerating statin therapy and has no symptoms of angina or claudication  Atypical lung infection also  Suggested by appearance of nodularity in the RML and right lingula on same CT     Outpatient Medications Prior to Visit  Medication Sig Dispense Refill  . amLODipine (NORVASC) 2.5 MG tablet TAKE 1 TABLET(2.5 MG) BY MOUTH DAILY 90 tablet 1  . Calcium Carbonate-Vit D-Min (CALCIUM 1200) 1200-1000 MG-UNIT CHEW Chew 1 tablet by mouth daily.      . Cholecalciferol (VITAMIN D-3) 25 MCG (1000 UT) CAPS Take by mouth.    . denosumab (PROLIA) 60 MG/ML SOSY injection Inject into the skin.    Marland Kitchen erythromycin ophthalmic ointment Apply to sutures 4 times a day  for 10-12 days.  Discontinue if allergy develops and call our office 3.5 g 2  . losartan (COZAAR) 100 MG tablet TAKE 1 TABLET(100 MG) BY MOUTH DAILY 90 tablet 1  . Omega-3 Fatty Acids (FISH OIL) 1000 MG CAPS Take 1 capsule by mouth 2 (two) times daily.    . pravastatin (PRAVACHOL) 20 MG tablet TAKE 1 TABLET(20 MG) BY MOUTH DAILY 90 tablet 1  . Turmeric 500 MG CAPS Take 1 capsule by mouth 2 (two) times daily.    . traMADol (ULTRAM) 50 MG tablet Take 1 every 4-6 hours as needed for pain not controlled by Tylenol (Patient not taking: Reported on 08/17/2019) 6 tablet 0   No facility-administered medications prior to visit.    Review of Systems;  Patient denies headache, fevers, malaise, unintentional weight loss, skin rash, eye pain, sinus congestion and sinus pain, sore throat, dysphagia,  hemoptysis , cough, dyspnea, wheezing, chest pain, palpitations, orthopnea, edema, abdominal pain, nausea, melena, diarrhea, constipation, flank pain, dysuria, hematuria, urinary  Frequency, nocturia, numbness, tingling, seizures,  Focal weakness, Loss of consciousness,  Tremor, insomnia, depression, anxiety, and suicidal ideation.      Objective:  BP (!) 144/74 (BP Location: Left Arm, Patient Position: Sitting, Cuff Size: Normal)   Pulse 66   Temp (!) 97.4 F (36.3 C) (Temporal)   Resp 15   Ht 5' 2.5" (1.588 m)   Wt 111 lb 12.8 oz (50.7 kg)   SpO2 96%   BMI 20.12 kg/m   BP Readings from  Last 3 Encounters:  08/17/19 (!) 144/74  03/02/19 133/64  07/26/18 (!) 160/70    Wt Readings from Last 3 Encounters:  08/17/19 111 lb 12.8 oz (50.7 kg)  03/02/19 110 lb (49.9 kg)  02/14/19 108 lb (49 kg)    General appearance: alert, cooperative and appears stated age Ears: normal TM's and external ear canals both ears Throat: lips, mucosa, and tongue normal; teeth and gums normal Neck: no adenopathy, no carotid bruit, supple, symmetrical, trachea midline and thyroid not enlarged, symmetric, no  tenderness/mass/nodules Back: symmetric, no curvature. ROM normal. No CVA tenderness. Lungs: clear to auscultation bilaterally Heart: regular rate and rhythm, S1, S2 normal, no murmur, click, rub or gallop Abdomen: soft, non-tender; bowel sounds normal; no masses,  no organomegaly Pulses: 2+ and symmetric Skin: Skin color, texture, turgor normal. No rashes or lesions Lymph nodes: Cervical, supraclavicular, and axillary nodes normal.  Lab Results  Component Value Date   HGBA1C 6.2 08/17/2019   HGBA1C 6.3 08/01/2018   HGBA1C 6.1 (A) 04/21/2018    Lab Results  Component Value Date   CREATININE 0.75 08/17/2019   CREATININE 0.73 08/01/2018   CREATININE 0.90 03/20/2018    Lab Results  Component Value Date   WBC 6.7 08/01/2018   HGB 12.9 08/01/2018   HCT 39.4 08/01/2018   PLT 249.0 08/01/2018   GLUCOSE 100 (H) 08/17/2019   CHOL 184 08/17/2019   TRIG 65.0 08/17/2019   HDL 80.50 08/17/2019   LDLDIRECT 137.0 01/06/2015   LDLCALC 90 08/17/2019   ALT 15 08/17/2019   AST 18 08/17/2019   NA 141 08/17/2019   K 4.6 08/17/2019   CL 103 08/17/2019   CREATININE 0.75 08/17/2019   BUN 17 08/17/2019   CO2 32 08/17/2019   TSH 1.18 10/19/2017   HGBA1C 6.2 08/17/2019   MICROALBUR 0.1 01/26/2012    CT RENAL STONE STUDY  Result Date: 03/09/2019 CLINICAL DATA:  Right hydronephrosis on prior outside MRI, no history of kidney stones EXAM: CT ABDOMEN AND PELVIS WITHOUT CONTRAST TECHNIQUE: Multidetector CT imaging of the abdomen and pelvis was performed following the standard protocol without IV contrast. COMPARISON:  None. FINDINGS: Lower chest: No acute abnormality. Clustered nodularity of the lateral segment right middle lobe with bandlike scarring or consolidation of the medial right middle lobe and lingula (series 3, image 3, 7). Coronary artery disease. Hepatobiliary: No solid liver abnormality is seen. No gallstones, gallbladder wall thickening, or biliary dilatation. Pancreas: Unremarkable.  No pancreatic ductal dilatation or surrounding inflammatory changes. Spleen: Normal in size without significant abnormality. Adrenals/Urinary Tract: Adrenal glands are unremarkable. Kidneys are normal, without renal calculi, solid lesion, or hydronephrosis. Bladder is unremarkable. Stomach/Bowel: Stomach is within normal limits. Appendix appears normal. No evidence of bowel wall thickening, distention, or inflammatory changes. Vascular/Lymphatic: Aortic atherosclerosis. No enlarged abdominal or pelvic lymph nodes. Reproductive: No mass or other significant abnormality. Other: No abdominal wall hernia or abnormality. No abdominopelvic ascites. Musculoskeletal: No acute or significant osseous findings. IMPRESSION: 1. No evidence of urinary tract calculus or hydronephrosis. 2. Clustered nodularity of the lateral segment right middle lobe with bandlike scarring or consolidation of the medial right middle lobe and lingula (series 3, image 3, 7). These findings are consistent with atypical infection, particularly atypical mycobacterium. Consider dedicated imaging of the chest if indicated by clinical symptoms. 3. Coronary artery disease.  Aortic Atherosclerosis (ICD10-I70.0). Electronically Signed   By: Eddie Candle M.D.   On: 03/09/2019 16:58    Assessment & Plan:   Problem List Items  Addressed This Visit      Unprioritized   Hypertension    Well controlled on current regimen of low dose amlodipine . Renal function stable, no changes today.  Lab Results  Component Value Date   CREATININE 0.75 08/17/2019   Lab Results  Component Value Date   NA 141 08/17/2019   K 4.6 08/17/2019   CL 103 08/17/2019   CO2 32 08/17/2019         Relevant Orders   Comprehensive metabolic panel (Completed)   Osteoporosis, postmenopausal   Relevant Orders   DG Bone Density   Hyperlipidemia - Primary    Marked imporvement with initiation of statin . She is tolerating medication and LFTS are normal   ASA stopped  after discussion of recent ASPREE trial and dicussion of risks and benefits.   Lab Results  Component Value Date   CHOL 184 08/17/2019   HDL 80.50 08/17/2019   LDLCALC 90 08/17/2019   LDLDIRECT 137.0 01/06/2015   TRIG 65.0 08/17/2019   CHOLHDL 2 08/17/2019   Lab Results  Component Value Date   ALT 15 08/17/2019   AST 18 08/17/2019   ALKPHOS 57 08/17/2019   BILITOT 0.7 08/17/2019         Relevant Orders   Lipid panel (Completed)   Colon cancer screening   Relevant Orders   Cologuard   Prediabetes    Secondary to family history of type 2 DM  . Patient is by no means overweight or sedentary.  She had  reduced her a1c from 6.5 to 5.4 last year a low GI diet ; repeat a1c was higher in march. 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  Suggestive of atypical infection per radiology,  But  She declined pulmonary  Consult at the time due to other health issues and no pulmonary or infectious symptoms.  Repeat CT recommended        Relevant Orders   CT Chest W Contrast   Coronary atherosclerosis of native coronary artery    Noted on Jan 2021 CT .  Asymptomatic.  Continue statin       Abdominal aortic atherosclerosis ([name])    Noted on Jan 2021 renal CT.  She has no claudication symptoms,  No histor yof angina.  Continue statin therapy.  Lab Results  Component Value Date   CHOL 184 08/17/2019   HDL 80.50 08/17/2019   LDLCALC 90 08/17/2019   LDLDIRECT 137.0 01/06/2015   TRIG 65.0 08/17/2019   CHOLHDL 2 08/17/2019          Other Visit Diagnoses    Vitamin D deficiency        Relevant Orders   VITAMIN D 25 Hydroxy (Vit-D Deficiency, Fractures) (Completed)     I provided  30 minutes of  face-to-face time during this encounter reviewing patient's current problems and past surgeries, labs and imaging studies, providing counseling on the above mentioned problems , and coordination  of care .  I am having Hoyle Sauer  S. Nazaryan maintain her Calcium 1200, Fish Oil, Turmeric, denosumab, Vitamin D-3, traMADol, erythromycin, amLODipine, pravastatin, and losartan.  No orders of the defined types were placed in this encounter.   There are no discontinued medications.  Follow-up: Return in about 6 months (around 02/17/2020).   Crecencio Mc, MD

## 2019-08-17 NOTE — Patient Instructions (Signed)
I recommend a supplement called "Hair, Skin and NAILS'  TO strengthen your fingernails.  Make sure you are gong for   A walk daily to help control your blood sugars    I will initiate the order for your colon cancer screening  Test.  It is called  Cologuard.  It will be delivered to your house, and you will send off a stool sample in the envelope it provides.    A repeat DEXA scan will be done today   Prediabetes Eating Plan Prediabetes is a condition that causes blood sugar (glucose) levels to be higher than normal. This increases the risk for developing diabetes. In order to prevent diabetes from developing, your health care provider may recommend a diet and other lifestyle changes to help you:  Control your blood glucose levels.  Improve your cholesterol levels.  Manage your blood pressure. Your health care provider may recommend working with a diet and nutrition specialist (dietitian) to make a meal plan that is best for you. What are tips for following this plan? Lifestyle  Set weight loss goals with the help of your health care team. It is recommended that most people with prediabetes lose 7% of their current body weight.  Exercise for at least 30 minutes at least 5 days a week.  Attend a support group or seek ongoing support from a mental health counselor.  Take over-the-counter and prescription medicines only as told by your health care provider. Reading food labels  Read food labels to check the amount of fat, salt (sodium), and sugar in prepackaged foods. Avoid foods that have: ? Saturated fats. ? Trans fats. ? Added sugars.  Avoid foods that have more than 300 milligrams (mg) of sodium per serving. Limit your daily sodium intake to less than 2,300 mg each day. Shopping  Avoid buying pre-made and processed foods. Cooking  Cook with olive oil. Do not use butter, lard, or ghee.  Bake, broil, grill, or boil foods. Avoid frying. Meal planning   Work with your  dietitian to develop an eating plan that is right for you. This may include: ? Tracking how many calories you take in. Use a food diary, notebook, or mobile application to track what you eat at each meal. ? Using the glycemic index (GI) to plan your meals. The index tells you how quickly a food will raise your blood glucose. Choose low-GI foods. These foods take a longer time to raise blood glucose.  Consider following a Mediterranean diet. This diet includes: ? Several servings each day of fresh fruits and vegetables. ? Eating fish at least twice a week. ? Several servings each day of whole grains, beans, nuts, and seeds. ? Using olive oil instead of other fats. ? Moderate alcohol consumption. ? Eating small amounts of red meat and whole-fat dairy.  If you have high blood pressure, you may need to limit your sodium intake or follow a diet such as the DASH eating plan. DASH is an eating plan that aims to lower high blood pressure. What foods are recommended? The items listed below may not be a complete list. Talk with your dietitian about what dietary choices are best for you. Grains Whole grains, such as whole-wheat or whole-grain breads, crackers, cereals, and pasta. Unsweetened oatmeal. Bulgur. Barley. Quinoa. Brown rice. Corn or whole-wheat flour tortillas or taco shells. Vegetables Lettuce. Spinach. Peas. Beets. Cauliflower. Cabbage. Broccoli. Carrots. Tomatoes. Squash. Eggplant. Herbs. Peppers. Onions. Cucumbers. Brussels sprouts. Fruits Berries. Bananas. Apples. Oranges. Grapes. Papaya. Mango.  Pomegranate. Kiwi. Grapefruit. Cherries. Meats and other protein foods Seafood. Poultry without skin. Lean cuts of pork and beef. Tofu. Eggs. Nuts. Beans. Dairy Low-fat or fat-free dairy products, such as yogurt, cottage cheese, and cheese. Beverages Water. Tea. Coffee. Sugar-free or diet soda. Seltzer water. Lowfat or no-fat milk. Milk alternatives, such as soy or almond milk. Fats and  oils Olive oil. Canola oil. Sunflower oil. Grapeseed oil. Avocado. Walnuts. Sweets and desserts Sugar-free or low-fat pudding. Sugar-free or low-fat ice cream and other frozen treats. Seasoning and other foods Herbs. Sodium-free spices. Mustard. Relish. Low-fat, low-sugar ketchup. Low-fat, low-sugar barbecue sauce. Low-fat or fat-free mayonnaise. What foods are not recommended? The items listed below may not be a complete list. Talk with your dietitian about what dietary choices are best for you. Grains Refined white flour and flour products, such as bread, pasta, snack foods, and cereals. Vegetables Canned vegetables. Frozen vegetables with butter or cream sauce. Fruits Fruits canned with syrup. Meats and other protein foods Fatty cuts of meat. Poultry with skin. Breaded or fried meat. Processed meats. Dairy Full-fat yogurt, cheese, or milk. Beverages Sweetened drinks, such as sweet iced tea and soda. Fats and oils Butter. Lard. Ghee. Sweets and desserts Baked goods, such as cake, cupcakes, pastries, cookies, and cheesecake. Seasoning and other foods Spice mixes with added salt. Ketchup. Barbecue sauce. Mayonnaise. Summary  To prevent diabetes from developing, you may need to make diet and other lifestyle changes to help control blood sugar, improve cholesterol levels, and manage your blood pressure.  Set weight loss goals with the help of your health care team. It is recommended that most people with prediabetes lose 7 percent of their current body weight.  Consider following a Mediterranean diet that includes plenty of fresh fruits and vegetables, whole grains, beans, nuts, seeds, fish, lean meat, low-fat dairy, and healthy oils. This information is not intended to replace advice given to you by your health care provider. Make sure you discuss any questions you have with your health care provider. Document Revised: 05/26/2018 Document Reviewed: 04/07/2016 Elsevier Patient  Education  2020 [employer].

## 2019-08-19 DIAGNOSIS — I2510 Atherosclerotic heart disease of native coronary artery without angina pectoris: Secondary | ICD-10-CM | POA: Insufficient documentation

## 2019-08-19 DIAGNOSIS — R918 Other nonspecific abnormal finding of lung field: Secondary | ICD-10-CM | POA: Insufficient documentation

## 2019-08-19 DIAGNOSIS — I700 Atherosclerosis of aorta: Secondary | ICD-10-CM | POA: Insufficient documentation

## 2019-08-19 NOTE — Assessment & Plan Note (Signed)
Noted on Jan 2021 renal CT.  She has no claudication symptoms,  No histor yof angina.  Continue statin therapy.  Lab Results  Component Value Date   CHOL 184 08/17/2019   HDL 80.50 08/17/2019   LDLCALC 90 08/17/2019   LDLDIRECT 137.0 01/06/2015   TRIG 65.0 08/17/2019   CHOLHDL 2 08/17/2019

## 2019-08-19 NOTE — Assessment & Plan Note (Signed)
Noted on Jan 2021 CT ,  Asymptomatic.  Suggestive of atypical infection per radiology,  But  She declined pulmonary  Consult at the time due to other health issues and no pulmonary or infectious symptoms.  Repeat CT recommended

## 2019-08-19 NOTE — Assessment & Plan Note (Signed)
Secondary to family history of type 2 DM  . Patient is by no means overweight or sedentary.  She had  reduced her a1c from 6.5 to 5.4 last year a low GI diet ; repeat a1c was higher in march. And remains elevated but non diagnostic    Lab Results  Component Value Date   HGBA1C 6.2 08/17/2019

## 2019-08-19 NOTE — Assessment & Plan Note (Signed)
Noted on Jan 2021 CT .  Asymptomatic.  Continue statin

## 2019-08-19 NOTE — Assessment & Plan Note (Signed)
Bilateral, s/p debulking surgery on the left in May 2020 at [facility]  She continues to have left sided facial weakness and lower eyelid lag

## 2019-08-19 NOTE — Assessment & Plan Note (Signed)
Well controlled on current regimen of low dose amlodipine . Renal function stable, no changes today.  Lab Results  Component Value Date   CREATININE 0.75 08/17/2019   Lab Results  Component Value Date   NA 141 08/17/2019   K 4.6 08/17/2019   CL 103 08/17/2019   CO2 32 08/17/2019

## 2019-08-19 NOTE — Assessment & Plan Note (Signed)
Marked imporvement with initiation of statin . She is tolerating medication and LFTS are normal   ASA stopped after discussion of recent ASPREE trial and dicussion of risks and benefits.   Lab Results  Component Value Date   CHOL 184 08/17/2019   HDL 80.50 08/17/2019   LDLCALC 90 08/17/2019   LDLDIRECT 137.0 01/06/2015   TRIG 65.0 08/17/2019   CHOLHDL 2 08/17/2019   Lab Results  Component Value Date   ALT 15 08/17/2019   AST 18 08/17/2019   ALKPHOS 57 08/17/2019   BILITOT 0.7 08/17/2019

## 2019-08-22 DIAGNOSIS — H02054 Trichiasis without entropian left upper eyelid: Secondary | ICD-10-CM | POA: Diagnosis not present

## 2019-08-22 DIAGNOSIS — H168 Other keratitis: Secondary | ICD-10-CM | POA: Diagnosis not present

## 2019-08-30 ENCOUNTER — Ambulatory Visit: Payer: Medicare Other

## 2019-08-30 ENCOUNTER — Ambulatory Visit
Admission: RE | Admit: 2019-08-30 | Discharge: 2019-08-30 | Disposition: A | Discharge status: Home / Self Care | Payer: Medicare Other | Source: Ambulatory Visit | Attending: Internal Medicine | Admitting: Internal Medicine

## 2019-08-30 ENCOUNTER — Other Ambulatory Visit: Payer: Self-pay

## 2019-08-30 DIAGNOSIS — I700 Atherosclerosis of aorta: Secondary | ICD-10-CM | POA: Diagnosis not present

## 2019-08-30 DIAGNOSIS — I2510 Atherosclerotic heart disease of native coronary artery without angina pectoris: Secondary | ICD-10-CM | POA: Diagnosis not present

## 2019-08-30 DIAGNOSIS — J984 Other disorders of lung: Secondary | ICD-10-CM | POA: Diagnosis not present

## 2019-08-30 DIAGNOSIS — R918 Other nonspecific abnormal finding of lung field: Secondary | ICD-10-CM

## 2019-08-30 MED ORDER — IOHEXOL 300 MG/ML  SOLN
60.0000 mL | Freq: Once | INTRAMUSCULAR | Status: AC | PRN
  Administered 2019-08-30: 60 mL via INTRAVENOUS

## 2019-09-18 ENCOUNTER — Telehealth: Payer: Self-pay | Admitting: Internal Medicine

## 2019-09-18 NOTE — Telephone Encounter (Signed)
Pt called in had questions about the cologuard if she should take it

## 2019-09-19 NOTE — Telephone Encounter (Signed)
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

## 2019-09-19 NOTE — Telephone Encounter (Signed)
Pt is following up on the below question.

## 2019-09-19 NOTE — Telephone Encounter (Signed)
I have never heard of that contraindication!  It may cause a false positive result. Tell her not to complete it.  We will have to go with colonoscopy.  Who does she prefer/

## 2019-09-20 DIAGNOSIS — H02235 Paralytic lagophthalmos left lower eyelid: Secondary | ICD-10-CM | POA: Diagnosis not present

## 2019-09-20 NOTE — Telephone Encounter (Signed)
Pt stated that she thinks she is about to have to have eye surgery again so she stated that she is going to postpone the colonoscopy for the time being.

## 2019-09-24 ENCOUNTER — Telehealth: Payer: Self-pay | Admitting: Internal Medicine

## 2019-09-24 NOTE — Telephone Encounter (Signed)
Left message ok to schedule Prolia injection on or after 10/04/19.

## 2019-09-24 NOTE — Telephone Encounter (Signed)
Patient scheduled for Prolia injection.

## 2019-09-24 NOTE — Telephone Encounter (Signed)
Pt was returning your call.

## 2019-09-25 DIAGNOSIS — E237 Disorder of pituitary gland, unspecified: Secondary | ICD-10-CM | POA: Diagnosis not present

## 2019-09-25 DIAGNOSIS — Q8502 Neurofibromatosis, type 2: Secondary | ICD-10-CM | POA: Diagnosis not present

## 2019-09-25 DIAGNOSIS — E236 Other disorders of pituitary gland: Secondary | ICD-10-CM | POA: Diagnosis not present

## 2019-09-25 DIAGNOSIS — D333 Benign neoplasm of cranial nerves: Secondary | ICD-10-CM | POA: Diagnosis not present

## 2019-09-25 DIAGNOSIS — G939 Disorder of brain, unspecified: Secondary | ICD-10-CM | POA: Diagnosis not present

## 2019-09-26 ENCOUNTER — Other Ambulatory Visit: Payer: Self-pay | Admitting: Internal Medicine

## 2019-10-02 DIAGNOSIS — H168 Other keratitis: Secondary | ICD-10-CM | POA: Diagnosis not present

## 2019-10-09 ENCOUNTER — Other Ambulatory Visit: Payer: Self-pay

## 2019-10-09 ENCOUNTER — Ambulatory Visit (INDEPENDENT_AMBULATORY_CARE_PROVIDER_SITE_OTHER): Payer: Medicare Other

## 2019-10-09 DIAGNOSIS — M810 Age-related osteoporosis without current pathological fracture: Secondary | ICD-10-CM

## 2019-10-09 MED ORDER — DENOSUMAB 60 MG/ML ~~LOC~~ SOSY
60.0000 mg | PREFILLED_SYRINGE | Freq: Once | SUBCUTANEOUS | Status: AC
  Administered 2019-10-09: 60 mg via SUBCUTANEOUS

## 2019-10-09 NOTE — Progress Notes (Signed)
Patient presented for 6-month Prolia injection SQ to left arm. Patient tolerated well. 

## 2019-10-30 DIAGNOSIS — H02054 Trichiasis without entropian left upper eyelid: Secondary | ICD-10-CM | POA: Diagnosis not present

## 2019-11-06 ENCOUNTER — Encounter: Payer: Self-pay | Admitting: Internal Medicine

## 2019-11-21 DIAGNOSIS — D333 Benign neoplasm of cranial nerves: Secondary | ICD-10-CM | POA: Diagnosis not present

## 2019-11-21 DIAGNOSIS — G510 Bell's palsy: Secondary | ICD-10-CM | POA: Diagnosis not present

## 2019-11-22 ENCOUNTER — Other Ambulatory Visit: Payer: Self-pay | Admitting: Internal Medicine

## 2019-11-22 DIAGNOSIS — Z23 Encounter for immunization: Secondary | ICD-10-CM | POA: Diagnosis not present

## 2019-12-11 DIAGNOSIS — H168 Other keratitis: Secondary | ICD-10-CM | POA: Diagnosis not present

## 2019-12-13 ENCOUNTER — Other Ambulatory Visit: Payer: Medicare Other

## 2019-12-19 DIAGNOSIS — H0223B Paralytic lagophthalmos left eye, upper and lower eyelids: Secondary | ICD-10-CM | POA: Diagnosis not present

## 2019-12-19 DIAGNOSIS — G510 Bell's palsy: Secondary | ICD-10-CM | POA: Diagnosis not present

## 2019-12-21 DIAGNOSIS — L821 Other seborrheic keratosis: Secondary | ICD-10-CM | POA: Diagnosis not present

## 2019-12-21 DIAGNOSIS — H189 Unspecified disorder of cornea: Secondary | ICD-10-CM | POA: Diagnosis not present

## 2019-12-21 DIAGNOSIS — H0223B Paralytic lagophthalmos left eye, upper and lower eyelids: Secondary | ICD-10-CM | POA: Diagnosis not present

## 2019-12-21 DIAGNOSIS — H029 Unspecified disorder of eyelid: Secondary | ICD-10-CM | POA: Diagnosis not present

## 2019-12-25 ENCOUNTER — Other Ambulatory Visit: Payer: Self-pay | Admitting: Internal Medicine

## 2019-12-25 ENCOUNTER — Other Ambulatory Visit: Payer: Medicare Other

## 2020-01-07 ENCOUNTER — Ambulatory Visit
Admission: RE | Admit: 2020-01-07 | Discharge: 2020-01-07 | Disposition: A | Discharge status: Home / Self Care | Payer: Medicare Other | Source: Ambulatory Visit | Attending: Internal Medicine | Admitting: Internal Medicine

## 2020-01-07 ENCOUNTER — Other Ambulatory Visit: Payer: Self-pay

## 2020-01-07 DIAGNOSIS — Z87828 Personal history of other (healed) physical injury and trauma: Secondary | ICD-10-CM | POA: Diagnosis not present

## 2020-01-07 DIAGNOSIS — R29890 Loss of height: Secondary | ICD-10-CM | POA: Diagnosis not present

## 2020-01-07 DIAGNOSIS — M8589 Other specified disorders of bone density and structure, multiple sites: Secondary | ICD-10-CM | POA: Diagnosis not present

## 2020-01-07 DIAGNOSIS — Z780 Asymptomatic menopausal state: Secondary | ICD-10-CM | POA: Diagnosis not present

## 2020-01-07 DIAGNOSIS — M810 Age-related osteoporosis without current pathological fracture: Secondary | ICD-10-CM | POA: Diagnosis not present

## 2020-01-10 NOTE — Progress Notes (Signed)
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  I would like to discuss this in more detail with you;   it is not urgent. But please schedule a visit to do so   Regards,   Deborra Medina, MD

## 2020-01-17 DIAGNOSIS — H02054 Trichiasis without entropian left upper eyelid: Secondary | ICD-10-CM | POA: Diagnosis not present

## 2020-01-17 DIAGNOSIS — I10 Essential (primary) hypertension: Secondary | ICD-10-CM | POA: Diagnosis not present

## 2020-01-21 ENCOUNTER — Encounter: Payer: Self-pay | Admitting: Ophthalmology

## 2020-01-21 ENCOUNTER — Other Ambulatory Visit: Payer: Self-pay

## 2020-01-22 ENCOUNTER — Other Ambulatory Visit
Admission: RE | Admit: 2020-01-22 | Discharge: 2020-01-22 | Disposition: A | Discharge status: Home / Self Care | Payer: Medicare Other | Source: Ambulatory Visit | Attending: Ophthalmology | Admitting: Ophthalmology

## 2020-01-22 DIAGNOSIS — Z20822 Contact with and (suspected) exposure to covid-19: Secondary | ICD-10-CM | POA: Diagnosis not present

## 2020-01-22 DIAGNOSIS — Z01812 Encounter for preprocedural laboratory examination: Secondary | ICD-10-CM | POA: Insufficient documentation

## 2020-01-22 NOTE — Discharge Instructions (Signed)
INSTRUCTIONS FOLLOWING OCULOPLASTIC SURGERY AMY M. FOWLER, MD  AFTER YOUR EYE SURGERY, THER ARE MANY THINGS WHICH YOU, THE PATIENT, CAN DO TO ASSURE THE BEST POSSIBLE RESULT FROM YOUR OPERATION.  THIS SHEET SHOULD BE REFERRED TO WHENEVER QUESTIONS ARISE.  IF THERE ARE ANY QUESTIONS NOT ANSWERED HERE, DO NOT HESITATE TO CALL OUR OFFICE AT [phone] OR [phone].  THERE IS ALWAYS SOMEONE AVAILABLE TO CALL IF QUESTIONS OR PROBLEMS ARISE.  VISION: Your vision may be blurred and out of focus after surgery until you are able to stop using your ointment, swelling resolves and your eye(s) heal. This may take 1 to 2 weeks at the least.  If your vision becomes gradually more dim or dark, this is not normal and you need to call our office immediately.  EYE CARE: For the first 48 hours after surgery, use ice packs frequently - "20 minutes on, 20 minutes off" - to help reduce swelling and bruising.  Small bags of frozen peas or corn make good ice packs along with cloths soaked in ice water.  If you are wearing a patch or other type of dressing following surgery, keep this on for the amount of time specified by your doctor.  For the first week following surgery, you will need to treat your stitches with great care.  It is OK to shower, but take care to not allow soapy water to run into your eye(s) to help reduce chances of infection.  You may gently clean the eyelashes and around the eye(s) with cotton balls and sterile water, BUT DO NOT RUB THE STITCHES VIGOROUSLY.  Keeping your stitches moist with ointment will help promote healing with minimal scar formation.  ACTIVITY: When you leave the surgery center, you should go home, rest and be inactive.  The eye(s) may feel scratchy and keeping the eyes closed will allow for faster healing.  The first week following surgery, avoid straining (anything making the face turn red) or lifting over 20 pounds.  Additionally, avoid bending which causes your head to go below  your waist.  Using your eyes will NOT harm them, so feel free to read, watch television, use the computer, etc as desired.  Driving depends on each individual, so check with your doctor if you have questions about driving. Do not wear contact lenses for about 2 weeks.  Do not wear eye makeup for 2 weeks.  Avoid swimming, hot tubs, gardening, and dusting for 1 to 2 weeks to reduce the risk of an infection.  MEDICATIONS:  You will be given a prescription for an ointment to use 4 times a day on your stitches.  You can use the ointment in your eyes if they feel scratchy or irritated.  If you eyelid(s) don't close completely when you sleep, put some ointment in your eyes before bedtime.  EMERGENCY: If you experience SEVERE EYE PAIN OR HEADACHE UNRELIEVED BY TYLENOL OR TRAMADOL, NAUSEA OR VOMITING, WORSENING REDNESS, OR WORSENING VISION (ESPECIALLY VISION THAT WAS INITIALLY BETTER) CALL [phone] OR [phone] DURING BUSINESS HOURS OR AFTER HOURS.  General Anesthesia, Adult, Care After This sheet gives you information about how to care for yourself after your procedure. Your health care provider may also give you more specific instructions. If you have problems or questions, contact your health care provider. What can I expect after the procedure? After the procedure, the following side effects are common:  Pain or discomfort at the IV site.  Nausea.  Vomiting.  Sore throat.  Trouble concentrating.    Feeling cold or chills.  Weak or tired.  Sleepiness and fatigue.  Soreness and body aches. These side effects can affect parts of the body that were not involved in surgery. Follow these instructions at home:  For at least 24 hours after the procedure:  Have a responsible adult stay with you. It is important to have someone help care for you until you are awake and alert.  Rest as needed.  Do not: ? Participate in activities in which you could fall or become injured. ? Drive. ? Use  heavy machinery. ? Drink alcohol. ? Take sleeping pills or medicines that cause drowsiness. ? Make important decisions or sign legal documents. ? Take care of children on your own. Eating and drinking  Follow any instructions from your health care provider about eating or drinking restrictions.  When you feel hungry, start by eating small amounts of foods that are soft and easy to digest (bland), such as toast. Gradually return to your regular diet.  Drink enough fluid to keep your urine pale yellow.  If you vomit, rehydrate by drinking water, juice, or clear broth. General instructions  If you have sleep apnea, surgery and certain medicines can increase your risk for breathing problems. Follow instructions from your health care provider about wearing your sleep device: ? Anytime you are sleeping, including during daytime naps. ? While taking prescription pain medicines, sleeping medicines, or medicines that make you drowsy.  Return to your normal activities as told by your health care provider. Ask your health care provider what activities are safe for you.  Take over-the-counter and prescription medicines only as told by your health care provider.  If you smoke, do not smoke without supervision.  Keep all follow-up visits as told by your health care provider. This is important. Contact a health care provider if:  You have nausea or vomiting that does not get better with medicine.  You cannot eat or drink without vomiting.  You have pain that does not get better with medicine.  You are unable to pass urine.  You develop a skin rash.  You have a fever.  You have redness around your IV site that gets worse. Get help right away if:  You have difficulty breathing.  You have chest pain.  You have blood in your urine or stool, or you vomit blood. Summary  After the procedure, it is common to have a sore throat or nausea. It is also common to feel tired.  Have a  responsible adult stay with you for the first 24 hours after general anesthesia. It is important to have someone help care for you until you are awake and alert.  When you feel hungry, start by eating small amounts of foods that are soft and easy to digest (bland), such as toast. Gradually return to your regular diet.  Drink enough fluid to keep your urine pale yellow.  Return to your normal activities as told by your health care provider. Ask your health care provider what activities are safe for you. This information is not intended to replace advice given to you by your health care provider. Make sure you discuss any questions you have with your health care provider. Document Revised: 02/04/2017 Document Reviewed: 09/17/2016 Elsevier Patient Education  2020 [employer]  

## 2020-01-23 DIAGNOSIS — G510 Bell's palsy: Secondary | ICD-10-CM | POA: Diagnosis not present

## 2020-01-23 LAB — SARS CORONAVIRUS 2 (TAT 6-24 HRS): SARS Coronavirus 2: NEGATIVE

## 2020-01-24 ENCOUNTER — Encounter: Payer: Self-pay | Admitting: Ophthalmology

## 2020-01-24 ENCOUNTER — Ambulatory Visit: Payer: Medicare Other | Admitting: Anesthesiology

## 2020-01-24 ENCOUNTER — Other Ambulatory Visit: Payer: Self-pay

## 2020-01-24 ENCOUNTER — Ambulatory Visit
Admission: RE | Admit: 2020-01-24 | Discharge: 2020-01-24 | Disposition: A | Discharge status: Home / Self Care | Payer: Medicare Other | Attending: Ophthalmology | Admitting: Ophthalmology

## 2020-01-24 ENCOUNTER — Encounter
Admission: RE | Disposition: A | Discharge status: Home / Self Care | Payer: Self-pay | Source: Home / Self Care | Attending: Ophthalmology

## 2020-01-24 DIAGNOSIS — H0223B Paralytic lagophthalmos left eye, upper and lower eyelids: Secondary | ICD-10-CM | POA: Insufficient documentation

## 2020-01-24 DIAGNOSIS — Z79899 Other long term (current) drug therapy: Secondary | ICD-10-CM | POA: Insufficient documentation

## 2020-01-24 DIAGNOSIS — H168 Other keratitis: Secondary | ICD-10-CM | POA: Insufficient documentation

## 2020-01-24 DIAGNOSIS — Z7982 Long term (current) use of aspirin: Secondary | ICD-10-CM | POA: Insufficient documentation

## 2020-01-24 DIAGNOSIS — H189 Unspecified disorder of cornea: Secondary | ICD-10-CM | POA: Insufficient documentation

## 2020-01-24 DIAGNOSIS — Z7983 Long term (current) use of bisphosphonates: Secondary | ICD-10-CM | POA: Diagnosis not present

## 2020-01-24 DIAGNOSIS — H02235 Paralytic lagophthalmos left lower eyelid: Secondary | ICD-10-CM | POA: Diagnosis not present

## 2020-01-24 HISTORY — PX: BROW LIFT: SHX178

## 2020-01-24 HISTORY — DX: Neurofibromatosis, type 2: Q85.02

## 2020-01-24 SURGERY — BLEPHAROPLASTY
Anesthesia: Monitor Anesthesia Care | Site: Eye | Laterality: Left

## 2020-01-24 MED ORDER — PROPOFOL 500 MG/50ML IV EMUL
INTRAVENOUS | Status: DC | PRN
  Administered 2020-01-24: 25 ug/kg/min via INTRAVENOUS

## 2020-01-24 MED ORDER — MIDAZOLAM HCL 2 MG/2ML IJ SOLN
INTRAMUSCULAR | Status: DC | PRN
  Administered 2020-01-24: 1 mg via INTRAVENOUS

## 2020-01-24 MED ORDER — ACETAMINOPHEN 160 MG/5ML PO SOLN: 325.0000 mg | Freq: Once | ORAL | Status: AC

## 2020-01-24 MED ORDER — LACTATED RINGERS IV SOLN: INTRAVENOUS | Status: DC

## 2020-01-24 MED ORDER — BSS IO SOLN
INTRAOCULAR | Status: DC | PRN
  Administered 2020-01-24: 15 mL via INTRAOCULAR

## 2020-01-24 MED ORDER — LIDOCAINE HCL (CARDIAC) PF 100 MG/5ML IV SOSY
PREFILLED_SYRINGE | INTRAVENOUS | Status: DC | PRN
  Administered 2020-01-24: 50 mg via INTRAVENOUS

## 2020-01-24 MED ORDER — LIDOCAINE-EPINEPHRINE 2 %-1:100000 IJ SOLN
INTRAMUSCULAR | Status: DC | PRN
  Administered 2020-01-24: 1 mL via OPHTHALMIC

## 2020-01-24 MED ORDER — ALFENTANIL 500 MCG/ML IJ INJ
INJECTION | INTRAVENOUS | Status: DC | PRN
  Administered 2020-01-24: 500 ug via INTRAVENOUS
  Administered 2020-01-24: 200 ug via INTRAVENOUS

## 2020-01-24 MED ORDER — TETRACAINE HCL 0.5 % OP SOLN
OPHTHALMIC | Status: DC | PRN
  Administered 2020-01-24: 1 [drp] via OPHTHALMIC

## 2020-01-24 MED ORDER — ACETAMINOPHEN 325 MG PO TABS
325.0000 mg | ORAL_TABLET | Freq: Once | ORAL | Status: AC
  Administered 2020-01-24: 650 mg via ORAL

## 2020-01-24 MED ORDER — ERYTHROMYCIN 5 MG/GM OP OINT
TOPICAL_OINTMENT | OPHTHALMIC | Status: DC | PRN
  Administered 2020-01-24: 1 via OPHTHALMIC

## 2020-01-24 SURGICAL SUPPLY — 25 items
APPLICATOR COTTON TIP WD 3 STR (MISCELLANEOUS) ×6 IMPLANT
BLADE SURG 15 STRL LF DISP TIS (BLADE) ×1 IMPLANT
BLADE SURG 15 STRL SS (BLADE) ×3
BLADE SURG SZ11 CARB STEEL (BLADE) ×3 IMPLANT
CORD BIP STRL DISP 12FT (MISCELLANEOUS) ×3 IMPLANT
DRAPE HEAD BAR (DRAPES) ×3 IMPLANT
GAUZE SPONGE 4X4 12PLY STRL (GAUZE/BANDAGES/DRESSINGS) ×3 IMPLANT
GLOVE SURG LX 7.0 MICRO (GLOVE) ×4
GLOVE SURG LX STRL 7.0 MICRO (GLOVE) ×2 IMPLANT
GOWN STRL REUS W/ TWL LRG LVL3 (GOWN DISPOSABLE) ×1 IMPLANT
GOWN STRL REUS W/TWL LRG LVL3 (GOWN DISPOSABLE) ×3
MARKER SKIN XFINE TIP W/RULER (MISCELLANEOUS) ×3 IMPLANT
NEEDLE FILTER BLUNT 18X 1/2SAF (NEEDLE) ×2
NEEDLE FILTER BLUNT 18X1 1/2 (NEEDLE) ×1 IMPLANT
NEEDLE HYPO 30X.5 LL (NEEDLE) ×6 IMPLANT
PACK ENT CUSTOM (PACKS) ×3 IMPLANT
SOL PREP PVP 2OZ (MISCELLANEOUS) ×3
SOLUTION PREP PVP 2OZ (MISCELLANEOUS) ×1 IMPLANT
SPONGE GAUZE 2X2 8PLY STER LF (GAUZE/BANDAGES/DRESSINGS) ×10
SPONGE GAUZE 2X2 8PLY STRL LF (GAUZE/BANDAGES/DRESSINGS) ×20 IMPLANT
SUT VICRYL 6-0  S14 CTD (SUTURE) ×3
SUT VICRYL 6-0 S14 CTD (SUTURE) ×1 IMPLANT
SYR 10ML LL (SYRINGE) ×3 IMPLANT
SYR 3ML LL SCALE MARK (SYRINGE) ×3 IMPLANT
WATER STERILE IRR 250ML POUR (IV SOLUTION) ×3 IMPLANT

## 2020-01-24 NOTE — Interval H&P Note (Signed)
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  The various methods of treatment have been discussed with the patient and family. After consideration of risks, benefits and other options for treatment, the patient has consented to  Procedure(s): TARSORRHAPHY, LATERAL PLACEMENT LEFT LOWER LID (Left) as a surgical intervention.  The patient's history has been reviewed, patient examined, no change in status, stable for surgery.  I have reviewed the patient's chart and labs.  Questions were answered to the patient's satisfaction.     Vickki Muff, Wendolyn Raso M

## 2020-01-24 NOTE — Anesthesia Procedure Notes (Signed)
Performed by: Henrique Parekh, CRNA Pre-anesthesia Checklist: Patient identified, Emergency Drugs available, Suction available, Timeout performed and Patient being monitored Patient Re-evaluated:Patient Re-evaluated prior to induction Oxygen Delivery Method: Nasal cannula Placement Confirmation: positive ETCO2       

## 2020-01-24 NOTE — Anesthesia Postprocedure Evaluation (Signed)
Anesthesia Post Note  Patient: Rita Taylor  Procedure(s) Performed: TARSORRHAPHY, LATERAL PLACEMENT LEFT LOWER LID (Left Eye)     Patient location during evaluation: PACU Anesthesia Type: MAC Level of consciousness: awake and alert and oriented Pain management: satisfactory to patient Vital Signs Assessment: post-procedure vital signs reviewed and stable Respiratory status: spontaneous breathing, nonlabored ventilation and respiratory function stable Cardiovascular status: blood pressure returned to baseline and stable Postop Assessment: Adequate PO intake and No signs of nausea or vomiting Anesthetic complications: no   No complications documented.  Raliegh Ip

## 2020-01-24 NOTE — Transfer of Care (Signed)
Immediate Anesthesia Transfer of Care Note  Patient: Rita Taylor  Procedure(s) Performed: TARSORRHAPHY, LATERAL PLACEMENT LEFT LOWER LID (Left Eye)  Patient Location: PACU  Anesthesia Type: MAC  Level of Consciousness: awake, alert  and patient cooperative  Airway and Oxygen Therapy: Patient Spontanous Breathing and Patient connected to supplemental oxygen  Post-op Assessment: Post-op Vital signs reviewed, Patient's Cardiovascular Status Stable, Respiratory Function Stable, Patent Airway and No signs of Nausea or vomiting  Post-op Vital Signs: Reviewed and stable  Complications: No complications documented.

## 2020-01-24 NOTE — Op Note (Signed)
Preoperative Diagnosis:   1. 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  Procedure(s) Performed:  1.  Medial canthoplasty, left side 2.  Medial tarsorraphy with intermarginal adhasions, left eye  Surgeon: Philis Pique. Vickki Muff, M.D.  Assistants: none  Anesthesia: MAC  Specimens: None.  Estimated Blood Loss: Minimal.  Complications: None.  Operative Findings: None   Procedure:   Allergies were reviewed and the patient Fosamax [alendronate], [id number], and Risedronate sodium.    After discussing the risks, benefits, complications, and alternatives with the patient, appropriate informed consent was obtained. The patient was brought to the operating suite and reclined supine. Time out was conducted and the patient was sedated.  Local anesthetic consisting of a 50-50 mixture of 2% lidocaine with epinephrine and 0.75% bupivacaine with added Hylenex was injected subcutaneously to the left  Medial canthal region(s) and eyelid(s). Additional anesthetic was injected subconjunctivally to the left medial eyelid(s).  After adequate local was instilled, the patient was prepped and draped in the usual sterile fashion for eyelid surgery. Attention was turned to the left medial canthal angle. A #11 blade was used to divide the skin and conjunctiva medial to the punctum taking care to avoid the canaliculi.  A strip of epithelium was excised along with some medial scar tissue overlying the caruncle. Hemostasis was obtained with bipolar cautery. The conjunctival edges were closed with interrupted 6-0 vicryl sutures and the skin margins were closed with 6-0 vicryl sutures.  Attention was then turned to the medial portion of the lids just lateral to the puncti. A #11 blade was used to divide the anterior and posterior lamellae fo a length of approximately 4 mm.  A strip of epithelium was excised off the margins of the posterior  lamellae. Two interrupted 6-0 vicryl sutures were used to approximate the tarsal plates and care was taken to make sure all suture ends were anterior to closure.   The patient tolerated the procedure well. Erythromycin ophthalmic ointment was applied to the incision site(s) followed by ice packs. The patient was taken to the recovery area where she recovered without difficulty.  Post-Op Plan/Instructions:  The patient was instructed to use ice packs frequently for the next 48 hours. She was instructed to use Erythromycin ophthalmic ointment on her incisions 4 times a day for the next 12 to 14 days. She was given a prescription for tramadol (or similar) for pain control should Tylenol not be effective. She was asked to to follow up at the [facility] in [place], [place] in 4-6 weeks' time or sooner as needed for problems.  Sumiye Hirth M. Vickki Muff, M.D. Ophthalmology

## 2020-01-24 NOTE — H&P (Signed)
See the history and physical completed at [facility] on 01/17/2020 and scanned into the chart.

## 2020-01-24 NOTE — Anesthesia Preprocedure Evaluation (Signed)
Anesthesia Evaluation  Patient identified by MRN, date of birth, ID band Patient awake    Reviewed: Allergy & Precautions, H&P , NPO status , Patient's Chart, lab work & pertinent test results  Airway Mallampati: II  TM Distance: >3 FB Neck ROM: full    Dental no notable dental hx.    Pulmonary    Pulmonary exam normal breath sounds clear to auscultation       Cardiovascular hypertension, Normal cardiovascular exam Rhythm:regular Rate:Normal     Neuro/Psych  Neuromuscular disease    GI/Hepatic   Endo/Other    Renal/GU      Musculoskeletal   Abdominal   Peds  Hematology   Anesthesia Other Findings   Reproductive/Obstetrics                             Anesthesia Physical Anesthesia Plan  ASA: II  Anesthesia Plan: MAC   Post-op Pain Management:    Induction:   PONV Risk Score and Plan: 2 and Treatment may vary due to age or medical condition, TIVA and Midazolam  Airway Management Planned:   Additional Equipment:   Intra-op Plan:   Post-operative Plan:   Informed Consent: I have reviewed the patients History and Physical, chart, labs and discussed the procedure including the risks, benefits and alternatives for the proposed anesthesia with the patient or authorized representative who has indicated his/her understanding and acceptance.     Dental Advisory Given  Plan Discussed with: CRNA  Anesthesia Plan Comments:         Anesthesia Quick Evaluation

## 2020-01-25 ENCOUNTER — Encounter: Payer: Self-pay | Admitting: Ophthalmology

## 2020-01-30 DIAGNOSIS — G510 Bell's palsy: Secondary | ICD-10-CM | POA: Diagnosis not present

## 2020-01-30 DIAGNOSIS — H905 Unspecified sensorineural hearing loss: Secondary | ICD-10-CM | POA: Diagnosis not present

## 2020-01-30 DIAGNOSIS — D333 Benign neoplasm of cranial nerves: Secondary | ICD-10-CM | POA: Diagnosis not present

## 2020-02-18 ENCOUNTER — Ambulatory Visit (INDEPENDENT_AMBULATORY_CARE_PROVIDER_SITE_OTHER): Payer: Medicare Other | Admitting: Internal Medicine

## 2020-02-18 ENCOUNTER — Encounter: Payer: Self-pay | Admitting: Internal Medicine

## 2020-02-18 ENCOUNTER — Other Ambulatory Visit: Payer: Self-pay

## 2020-02-18 VITALS — BP 146/70 | HR 75 | Temp 97.4°F | Resp 15 | Ht 62.5 in | Wt 110.8 lb

## 2020-02-18 DIAGNOSIS — D649 Anemia, unspecified: Secondary | ICD-10-CM | POA: Diagnosis not present

## 2020-02-18 DIAGNOSIS — R197 Diarrhea, unspecified: Secondary | ICD-10-CM | POA: Diagnosis not present

## 2020-02-18 DIAGNOSIS — E559 Vitamin D deficiency, unspecified: Secondary | ICD-10-CM

## 2020-02-18 DIAGNOSIS — F411 Generalized anxiety disorder: Secondary | ICD-10-CM | POA: Diagnosis not present

## 2020-02-18 DIAGNOSIS — E7800 Pure hypercholesterolemia, unspecified: Secondary | ICD-10-CM

## 2020-02-18 DIAGNOSIS — I700 Atherosclerosis of aorta: Secondary | ICD-10-CM | POA: Diagnosis not present

## 2020-02-18 DIAGNOSIS — I10 Essential (primary) hypertension: Secondary | ICD-10-CM

## 2020-02-18 DIAGNOSIS — I2510 Atherosclerotic heart disease of native coronary artery without angina pectoris: Secondary | ICD-10-CM

## 2020-02-18 DIAGNOSIS — D333 Benign neoplasm of cranial nerves: Secondary | ICD-10-CM

## 2020-02-18 MED ORDER — AMLODIPINE BESYLATE 5 MG PO TABS: 5.0000 mg | ORAL_TABLET | Freq: Every day | ORAL | 1 refills | Status: DC

## 2020-02-18 MED ORDER — SERTRALINE HCL 25 MG PO TABS: 25.0000 mg | ORAL_TABLET | Freq: Every day | ORAL | 0 refills | Status: DC

## 2020-02-18 MED ORDER — ROSUVASTATIN CALCIUM 20 MG PO TABS: 20.0000 mg | ORAL_TABLET | ORAL | 3 refills | Status: DC

## 2020-02-18 NOTE — Progress Notes (Signed)
Subjective:  Patient ID: Rita Taylor, female    DOB: [birth year]  Age: 80 y.o. MRN: [id number]  CC: The primary encounter diagnosis was Primary hypertension. 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  HPI Rita Taylor presents for follow up on hypertension and other issues   This visit occurred during the SARS-CoV-2 public health emergency.  Safety protocols were in place, including screening questions prior to the visit, additional usage of staff PPE, and extensive cleaning of exam room while observing appropriate contact time as indicated for disinfecting solutions.   HTN:  Taking losartan 100 mg,  Amlodipine 2.5 mg  Daily  Home readings have not been done,  But review of multiple recent OV's both locally and via Epic portal note elevations.  discussed increasing amlodipine to 5 mg daily   Aortic arch atherosclerosis:  Reviewed findings of prior CT scan done In [facility] for follow up on pulmonary nodules.   Patient  Is taking pravastatin.  Discussed the role of high potency statin therapy in stablizing placque and preventing events.  She is willing to change to high potency statin.     Positive screen for anxiety:   Secondary to recurrent episodes of of left sided facial palsy and the uncertainty of the prognosis.  .  H/o vestibular schwannoma with resection on left side. Done in 2020  Subsequently developed residual ptosis of left eyelid and uncontrolled lacrimation of left eye.  Developed  left sided palsy this summer, to a greater degree than what happened post operatively.  Palsy resolved after a month,  But has recurred again..   The emotional stress is causing uncontrolled anxiety. Also having recurrence of headaches for the  past month, occurring daily on the left forehead .  Resolve with tylenol but recur daily.  Last CT brain done August 2021 . [facility] following,  Considering plastic  surgery    Colon CA screening: due,  But did not do the Cologuard test because of her history of neurofibromatosis . Last scope normal 2015.  Wants to defer for now     Outpatient Medications Prior to Visit  Medication Sig Dispense Refill  . ASPIRIN 81 PO Take by mouth daily.    . Biotin w/ Vitamins C & E (HAIR/SKIN/NAILS PO) Take by mouth daily.    . Calcium Carbonate-Vit D-Min (CALCIUM 1200) 1200-1000 MG-UNIT CHEW Chew 1 tablet by mouth daily.    . Cholecalciferol (VITAMIN D-3) 25 MCG (1000 UT) CAPS Take by mouth.    . denosumab (PROLIA) 60 MG/ML SOSY injection Inject into the skin.    Marland Kitchen erythromycin ophthalmic ointment Apply to sutures 4 times a day for 10-12 days.  Discontinue if allergy develops and call our office 3.5 g 2  . losartan (COZAAR) 100 MG tablet TAKE 1 TABLET(100 MG) BY MOUTH DAILY 90 tablet 1  . Omega-3 Fatty Acids (FISH OIL) 1000 MG CAPS Take 1 capsule by mouth 2 (two) times daily.    . Turmeric 500 MG CAPS Take 1 capsule by mouth 2 (two) times daily.    Marland Kitchen amLODipine (NORVASC) 2.5 MG tablet TAKE 1 TABLET(2.5 MG) BY MOUTH DAILY 90 tablet 1  . pravastatin (PRAVACHOL) 20 MG tablet TAKE 1 TABLET(20 MG) BY MOUTH DAILY 90 tablet 1  . traMADol (ULTRAM) 50 MG tablet Take  1 every 4-6 hours as needed for pain not controlled by Tylenol (Patient not taking: No sig reported) 6 tablet 0   No facility-administered medications prior to visit.    Review of Systems;  Patient denies , fevers, malaise, unintentional weight loss, skin rash, eye pain, sinus congestion and sinus pain, sore throat, dysphagia,  hemoptysis , cough, dyspnea, wheezing, chest pain, palpitations, orthopnea, edema, abdominal pain, nausea, melena, diarrhea, constipation, flank pain, dysuria, hematuria, urinary  Frequency, nocturia, numbness,  tingling, seizures,   Loss of consciousness,  Tremor, insomnia, and suicidal ideation.      Objective:  BP (!) 146/70 (BP Location: Left Arm, Patient Position: Sitting, Cuff Size: Normal)   Pulse 75   Temp (!) 97.4 F (36.3 C) (Oral)   Resp 15   Ht 5' 2.5" (1.588 m)   Wt 110 lb 12.8 oz (50.3 kg)   SpO2 97%   BMI 19.94 kg/m   BP Readings from Last 3 Encounters:  02/18/20 (!) 146/70  01/24/20 (!) 152/66  08/17/19 (!) 144/74    Wt Readings from Last 3 Encounters:  02/18/20 110 lb 12.8 oz (50.3 kg)  01/24/20 112 lb (50.8 kg)  08/17/19 111 lb 12.8 oz (50.7 kg)    General appearance: alert, cooperative and appears stated age Ears: normal TM's and external ear canals both ears Face: left eye with ptosis of eyelid and excessive lacrimation. Face: left sided facial droop.  Throat: lips, mucosa, and tongue normal; teeth and gums normal Neck: no adenopathy, no carotid bruit, supple, symmetrical, trachea midline and thyroid not enlarged, symmetric, no tenderness/mass/nodules Back: symmetric, no curvature. ROM normal. No CVA tenderness. Lungs: clear to auscultation bilaterally Heart: regular rate and rhythm, S1, S2 normal, no murmur, click, rub or gallop Abdomen: soft, non-tender; bowel sounds normal; no masses,  no organomegaly Pulses: 2+ and symmetric Skin: Skin color, texture, turgor normal. No rashes or lesions Lymph nodes: Cervical, supraclavicular, and axillary nodes normal.  Lab Results  Component Value Date   HGBA1C 6.2 08/17/2019   HGBA1C 6.3 08/01/2018   HGBA1C 6.1 (A) 04/21/2018    Lab Results  Component Value Date   CREATININE 0.75 08/17/2019   CREATININE 0.73 08/01/2018   CREATININE 0.90 03/20/2018    Lab Results  Component Value Date   WBC 6.7 08/01/2018   HGB 12.9 08/01/2018   HCT 39.4 08/01/2018   PLT 249.0 08/01/2018   GLUCOSE 100 (H) 08/17/2019   CHOL 184 08/17/2019   TRIG 65.0 08/17/2019   HDL 80.50 08/17/2019   LDLDIRECT 137.0 01/06/2015    LDLCALC 90 08/17/2019   ALT 15 08/17/2019   AST 18 08/17/2019   NA 141 08/17/2019   K 4.6 08/17/2019   CL 103 08/17/2019   CREATININE 0.75 08/17/2019   BUN 17 08/17/2019   CO2 32 08/17/2019   TSH 1.18 10/19/2017   HGBA1C 6.2 08/17/2019   MICROALBUR 0.1 01/26/2012    No results found.  Assessment & Plan:   Problem List Items Addressed This Visit      Unprioritized   Abdominal aortic atherosclerosis ([place])    Reviewed findings of prior CT scan today..  Patient is willing to  Increase potency of statin therapy to 20 mg crestor once daily       Relevant Medications   rosuvastatin (CRESTOR) 20 MG tablet   amLODipine (NORVASC) 5 MG tablet   Anemia    Noted on May 2020 labs from [facility]  Not present in June,  And iron studies are normal .  Recheck due.   Lab Results  Component Value Date   WBC 6.7 08/01/2018   HGB 12.9 08/01/2018   HCT 39.4 08/01/2018   MCV 92.7 08/01/2018   PLT 249.0 08/01/2018         Relevant Orders   CBC with Differential/Platelet   Coronary atherosclerosis of native coronary artery    Noted on Jan 2021 CT .  Continues to be Asymptomatic.  Continue statin but increase to high potency discussed today        Relevant Medications   rosuvastatin (CRESTOR) 20 MG tablet   amLODipine (NORVASC) 5 MG tablet   Generalized anxiety disorder    Starting sertraline at 25 mg dose with plans to increase to 50 mg after one week if tolerated.  She will follow up via Mychart after 2 weeks of 50 mg dose      Relevant Medications   sertraline (ZOLOFT) 25 MG tablet   Hyperlipidemia   Relevant Medications   rosuvastatin (CRESTOR) 20 MG tablet   amLODipine (NORVASC) 5 MG tablet   Other Relevant Orders   Lipid panel   Hypertension - Primary    Increase amlodipine dose to  5 mg and continue losartan 100 mg daily .  encouraged to check at home.       Relevant Medications   rosuvastatin (CRESTOR) 20 MG tablet   amLODipine (NORVASC) 5 MG tablet   Other Relevant  Orders   Comprehensive metabolic panel   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(H[facility])    Bilateral, s/p debulking surgery on the left in May 2020 at [facility]  She continues to have left sided facial weakness and lower eyelid lag which is upsetting and resulting in a constant feeling of anxiety.        Relevant Medications   sertraline (ZOLOFT) 25 MG tablet    Other Visit Diagnoses    Vitamin D deficiency       Relevant Orders   VITAMIN D 25 Hydroxy (Vit-D Deficiency, Fractures)      I have discontinued Uvaldo Rising. Stann's pravastatin. I have also changed her amLODipine. Additionally, I am having her start on sertraline and rosuvastatin. Lastly, I am having her maintain her Calcium 1200, Fish Oil, Turmeric, denosumab, Vitamin D-3, traMADol, erythromycin, losartan, ASPIRIN 81 PO, and Biotin w/ Vitamins C & E (HAIR/SKIN/NAILS PO).  Meds ordered this encounter  Medications  . sertraline (ZOLOFT) 25 MG tablet    Sig: Take 1 tablet (25 mg total) by mouth daily.    Dispense:  90 tablet    Refill:  0  . rosuvastatin (CRESTOR) 20 MG tablet    Sig: Take 1 tablet (20 mg total) by mouth every other day.    Dispense:  45 tablet    Refill:  3  . amLODipine (NORVASC) 5 MG tablet    Sig: Take 1 tablet (5 mg total) by mouth daily. TAKE 1 TABLET(2.5 MG) BY MOUTH DAILY    Dispense:  90 tablet    Refill:  1    I provided  30 minutes  during this encounter reviewing patient's current problems and past surgeries, labs and imaging studies, providing counseling on the above mentioned problems I na face to face visit  , and coordination  of care . Medications Discontinued During This Encounter  Medication Reason  . pravastatin (PRAVACHOL) 20 MG tablet   . amLODipine (NORVASC) 2.5 MG tablet     Follow-up: Return in about 6 months (around 08/17/2020).   [name], MD

## 2020-02-18 NOTE — Patient Instructions (Addendum)
For your anxiety:   Start generic zoloft at 1/2 tablet with dinner  FOR ONE WEEK  If no nausea after one week, increase to full tablet daily  After 2 weeks on full tablet daily, you may increase your daily dose to 50 mg if anxiety is not under control.   For your headaches:  You can add up to 2000 mg of acetominophen (tylenol) every day safely  In divided doses (500 mg every 6 hours  Or 1000 mg every 12 hours.)   We are STOPPING pravastatin and STARTING ROSUVASTATIN..  YOU can start with EVERY OTHER DAY DOSING and increase to daily if tolerated.  WE ARE INCREASING the amlodipine to 5 mg daily for goal BP 130/80 or less.    RETURN IN 3 WEEKS FOR LABS ONLY (FASTING)

## 2020-02-18 NOTE — Assessment & Plan Note (Addendum)
Reviewed findings of prior CT scan today..  Patient is willing to  Increase potency of statin therapy to 20 mg crestor once daily

## 2020-02-19 ENCOUNTER — Encounter: Payer: Self-pay | Admitting: Internal Medicine

## 2020-02-19 DIAGNOSIS — F411 Generalized anxiety disorder: Secondary | ICD-10-CM | POA: Insufficient documentation

## 2020-02-19 NOTE — Assessment & Plan Note (Addendum)
Increase amlodipine dose to  5 mg and continue losartan 100 mg daily .  encouraged to check at home.

## 2020-02-19 NOTE — Assessment & Plan Note (Signed)
Starting sertraline at 25 mg dose with plans to increase to 50 mg after one week if tolerated.  She will follow up via Mychart after 2 weeks of 50 mg dose

## 2020-02-19 NOTE — Assessment & Plan Note (Signed)
Noted on Jan 2021 CT .  Continues to be Asymptomatic.  Continue statin but increase to high potency discussed today

## 2020-02-19 NOTE — Assessment & Plan Note (Signed)
Bilateral, s/p debulking surgery on the left in May 2020 at [facility]  She continues to have left sided facial weakness and lower eyelid lag which is upsetting and resulting in a constant feeling of anxiety.

## 2020-02-19 NOTE — Assessment & Plan Note (Signed)
Noted on May 2020 labs from [facility]  Not present in June,  And iron studies are normal .  Recheck due.   Lab Results  Component Value Date   WBC 6.7 08/01/2018   HGB 12.9 08/01/2018   HCT 39.4 08/01/2018   MCV 92.7 08/01/2018   PLT 249.0 08/01/2018

## 2020-02-26 ENCOUNTER — Telehealth: Payer: Self-pay | Admitting: Internal Medicine

## 2020-02-26 DIAGNOSIS — R197 Diarrhea, unspecified: Secondary | ICD-10-CM

## 2020-02-26 NOTE — Telephone Encounter (Signed)
Diarrhea is not a common side effect of ANY of these medications,  But it may be the zoloft  So I recommend that she stop the sertraline and continue the other two for a week.. if the stools do not improve , please schedule a lab visit for stool studies    FYI please share with Judson Roch and trish and brock : Patients often use the word "diarrhea" too easily to describe a change in bowel habits  Please ask patients the following questions to confirm "diarrhea"  Are the stools liquid,, or just soft?  How many are you having per day  How many days have you had them?  Are you having any solid stools on the same day?

## 2020-02-26 NOTE — Telephone Encounter (Signed)
Pt saw Dr. Derrel Nip on 02/21/19 and started these new medication  sertraline (ZOLOFT) 25 MG tablet sertraline ,amLODipine (NORVASC) 5 MG tablet and rosuvastatin (CRESTOR) 20 MG tablet  She is now having diarrhea and would like a call back to discuss side effects of medication

## 2020-02-26 NOTE — Telephone Encounter (Signed)
Spoke with pt and she stated that her stools are formed just softer than her normal. She stated that she is only having one bowel movement daily and started having them on Saturday. Pt stated that she is concerned because very time she goes to the bathroom to urinate she leaks bowel. Pt was advised to go ahead and stop the zoloft for a week and I would send the doctor a message.

## 2020-02-26 NOTE — Telephone Encounter (Signed)
Is diabetes a side effect of either of these medications?

## 2020-02-29 DIAGNOSIS — R197 Diarrhea, unspecified: Secondary | ICD-10-CM | POA: Diagnosis not present

## 2020-02-29 NOTE — Telephone Encounter (Signed)
Patient has been informed.

## 2020-02-29 NOTE — Telephone Encounter (Signed)
She can resume  Zoloft.  Stool studies ordered

## 2020-02-29 NOTE — Telephone Encounter (Signed)
Called and spoke to [place]. Rita Taylor states that she has been having diarrhea since 02/23/2020. She has stopped taking the zoloft on 02/25/2020 and it has gotten worse. Pt is using the restroom multiple times a day and has used it 4 times today. She is not having any solid stools. All bowel movements are very soft. Rita Taylor asks if she can go back onto her Zoloft medication. Which Stool labs did you want ordered for patient?

## 2020-02-29 NOTE — Telephone Encounter (Signed)
Pt said diarrhea is worse and wants to know what she should do?

## 2020-02-29 NOTE — Addendum Note (Signed)
Addended by: Crecencio Mc on: 02/29/2020 01:30 PM   Modules accepted: Orders

## 2020-02-29 NOTE — Addendum Note (Signed)
Addended by: Tor Netters I on: 02/29/2020 04:35 PM   Modules accepted: Orders

## 2020-03-01 ENCOUNTER — Other Ambulatory Visit
Admission: RE | Admit: 2020-03-01 | Discharge: 2020-03-01 | Disposition: A | Discharge status: Home / Self Care | Payer: Medicare Other | Source: Ambulatory Visit | Attending: Internal Medicine | Admitting: Internal Medicine

## 2020-03-01 DIAGNOSIS — R197 Diarrhea, unspecified: Secondary | ICD-10-CM | POA: Insufficient documentation

## 2020-03-01 LAB — CLOSTRIDIUM DIFFICILE BY PCR: Toxigenic C. Difficile by PCR: NEGATIVE

## 2020-03-01 LAB — GASTROINTESTINAL PANEL BY PCR, STOOL (REPLACES STOOL CULTURE)

## 2020-03-02 NOTE — Progress Notes (Signed)
Rita Taylor,  Your stools studies showed no evidence of infection , including c dif . If your stools are soft (not runny), continue your current medications.  If you are having liquid(runny) stools, suspend the rosuvastatin for a week to see if the symptoms resolve.  Regards,   Deborra Medina, MD

## 2020-03-10 ENCOUNTER — Other Ambulatory Visit: Payer: Self-pay

## 2020-03-10 ENCOUNTER — Other Ambulatory Visit (INDEPENDENT_AMBULATORY_CARE_PROVIDER_SITE_OTHER): Payer: Medicare Other

## 2020-03-10 DIAGNOSIS — E559 Vitamin D deficiency, unspecified: Secondary | ICD-10-CM | POA: Diagnosis not present

## 2020-03-10 DIAGNOSIS — D649 Anemia, unspecified: Secondary | ICD-10-CM

## 2020-03-10 DIAGNOSIS — E7800 Pure hypercholesterolemia, unspecified: Secondary | ICD-10-CM

## 2020-03-10 DIAGNOSIS — I10 Essential (primary) hypertension: Secondary | ICD-10-CM

## 2020-03-10 LAB — LIPID PANEL
Cholesterol: 143 mg/dL (ref 0–200)
HDL: 79.4 mg/dL (ref >39.00)
LDL Cholesterol: 55 mg/dL (ref 0–99)
NonHDL: 63.63
Total CHOL/HDL Ratio: 2
Triglycerides: 43 mg/dL (ref 0.0–149.0)
VLDL: 8.6 mg/dL (ref 0.0–40.0)

## 2020-03-10 LAB — VITAMIN D 25 HYDROXY (VIT D DEFICIENCY, FRACTURES): VITD: 45.05 ng/mL (ref 30.00–100.00)

## 2020-03-10 LAB — CBC WITH DIFFERENTIAL/PLATELET
Basophils Absolute: 0.1 10*3/uL (ref 0.0–0.1)
Basophils Relative: 1.5 % (ref 0.0–3.0)
Eosinophils Absolute: 0 10*3/uL (ref 0.0–0.7)
Eosinophils Relative: 0.5 % (ref 0.0–5.0)
HCT: 35.5 % — ABNORMAL LOW (ref 36.0–46.0)
Hemoglobin: 11.9 g/dL — ABNORMAL LOW (ref 12.0–15.0)
Lymphocytes Relative: 38.8 % (ref 12.0–46.0)
Lymphs Abs: 3.1 10*3/uL (ref 0.7–4.0)
MCHC: 33.5 g/dL (ref 30.0–36.0)
MCV: 87.5 fl (ref 78.0–100.0)
Monocytes Absolute: 0.6 10*3/uL (ref 0.1–1.0)
Monocytes Relative: 7.6 % (ref 3.0–12.0)
Neutro Abs: 4.1 10*3/uL (ref 1.4–7.7)
Neutrophils Relative %: 51.6 % (ref 43.0–77.0)
Platelets: 218 10*3/uL (ref 150.0–400.0)
RBC: 4.05 Mil/uL (ref 3.87–5.11)
RDW: 14.4 % (ref 11.5–15.5)
WBC: 8 10*3/uL (ref 4.0–10.5)

## 2020-03-10 LAB — COMPREHENSIVE METABOLIC PANEL
ALT: 17 U/L (ref 0–35)
AST: 20 U/L (ref 0–37)
Albumin: 4.4 g/dL (ref 3.5–5.2)
Alkaline Phosphatase: 49 U/L (ref 39–117)
BUN: 18 mg/dL (ref 6–23)
CO2: 30 mEq/L (ref 19–32)
Calcium: 9.4 mg/dL (ref 8.4–10.5)
Chloride: 103 mEq/L (ref 96–112)
Creatinine, Ser: 0.87 mg/dL (ref 0.40–1.20)
GFR: 62.97 mL/min (ref >60.00)
Glucose, Bld: 91 mg/dL (ref 70–99)
Potassium: 4.4 mEq/L (ref 3.5–5.1)
Sodium: 140 mEq/L (ref 135–145)
Total Bilirubin: 0.5 mg/dL (ref 0.2–1.2)
Total Protein: 7 g/dL (ref 6.0–8.3)

## 2020-03-12 DIAGNOSIS — H02054 Trichiasis without entropian left upper eyelid: Secondary | ICD-10-CM | POA: Diagnosis not present

## 2020-03-18 NOTE — Telephone Encounter (Signed)
Called and spoke to [place]. Prajna states that she has had loose stools for the last 3 weeks. She has stopped taking Zoloft and her loose stools got worse, But it is not fully liquid diarrhea so she has not stopped taking the Rosuvastatin. Keayra states that for the last 24 hours, she has felt fine and her stools have not been loose. Lysbeth states that for the moment her issues are resolved and she will let us know if it worsens again. She asks if she should stop taking any other medication.

## 2020-04-02 DIAGNOSIS — D333 Benign neoplasm of cranial nerves: Secondary | ICD-10-CM | POA: Diagnosis not present

## 2020-04-02 DIAGNOSIS — G510 Bell's palsy: Secondary | ICD-10-CM | POA: Diagnosis not present

## 2020-04-03 NOTE — Telephone Encounter (Signed)
Patient called to let Dr. Derrel Nip know her stools are back to normal and what should she do next?

## 2020-04-09 DIAGNOSIS — H168 Other keratitis: Secondary | ICD-10-CM | POA: Diagnosis not present

## 2020-04-10 ENCOUNTER — Ambulatory Visit (INDEPENDENT_AMBULATORY_CARE_PROVIDER_SITE_OTHER): Payer: Medicare Other

## 2020-04-10 ENCOUNTER — Other Ambulatory Visit: Payer: Self-pay

## 2020-04-10 DIAGNOSIS — M810 Age-related osteoporosis without current pathological fracture: Secondary | ICD-10-CM | POA: Diagnosis not present

## 2020-04-10 MED ORDER — DENOSUMAB 60 MG/ML ~~LOC~~ SOSY
60.0000 mg | PREFILLED_SYRINGE | Freq: Once | SUBCUTANEOUS | Status: AC
  Administered 2020-04-10: 60 mg via SUBCUTANEOUS

## 2020-04-10 NOTE — Progress Notes (Signed)
Patient presented for 6-month Prolia injection SQ to left arm. Patient tolerated well. 

## 2020-04-21 ENCOUNTER — Telehealth: Payer: Self-pay | Admitting: Internal Medicine

## 2020-04-21 ENCOUNTER — Other Ambulatory Visit: Payer: Self-pay

## 2020-04-21 MED ORDER — SERTRALINE HCL 50 MG PO TABS
50.0000 mg | ORAL_TABLET | Freq: Every day | ORAL | 1 refills | Status: DC
Start: 2020-04-21 — End: 2020-05-05

## 2020-04-21 NOTE — Telephone Encounter (Signed)
Patient stated that Dr. Derrel Nip told her she could take 2 of her sertraline (ZOLOFT) 25 MG tablet. Now she needs a refill.

## 2020-04-25 DIAGNOSIS — Z85820 Personal history of malignant melanoma of skin: Secondary | ICD-10-CM | POA: Diagnosis not present

## 2020-04-25 DIAGNOSIS — D2261 Melanocytic nevi of right upper limb, including shoulder: Secondary | ICD-10-CM | POA: Diagnosis not present

## 2020-04-25 DIAGNOSIS — D2262 Melanocytic nevi of left upper limb, including shoulder: Secondary | ICD-10-CM | POA: Diagnosis not present

## 2020-04-25 DIAGNOSIS — D2272 Melanocytic nevi of left lower limb, including hip: Secondary | ICD-10-CM | POA: Diagnosis not present

## 2020-04-25 DIAGNOSIS — D2271 Melanocytic nevi of right lower limb, including hip: Secondary | ICD-10-CM | POA: Diagnosis not present

## 2020-04-25 DIAGNOSIS — D225 Melanocytic nevi of trunk: Secondary | ICD-10-CM | POA: Diagnosis not present

## 2020-04-29 IMAGING — MR MR BRAIN/IAC WO/W
10 of 15 series · 26 of 48 positions shown · IV contrast (agent unspecified)
Comparison: None.

CLINICAL DATA: Sensorineural hearing loss left ear

EXAM:
MRI HEAD WITHOUT AND WITH CONTRAST
TECHNIQUE: Multiplanar, multiecho pulse sequences of the brain and surrounding
structures were obtained without and with intravenous contrast.
CONTRAST:  5 mL Gadovist IV

[Series 5: T1 · sagittal · 5.0mm · 0.62mm/px · 1 of 25 slices shown (1 of 3)]
[im 1/25]
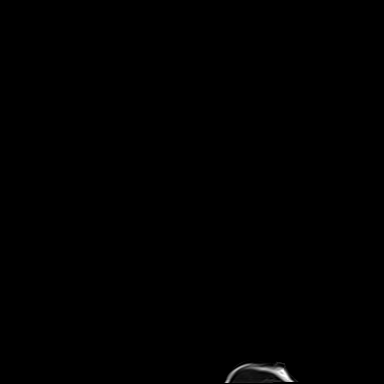

[Series 6: ax dwi_tracew · axial · 3.0mm · 0.73mm/px · z∈[-48,+105]mm · 4 of 52 slices shown]
[im 1/52]
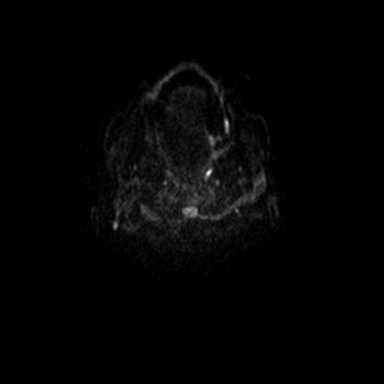
[im 18/52]
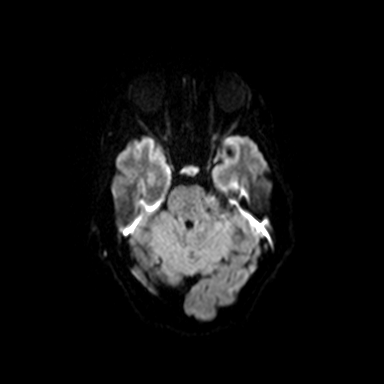
[im 35/52]
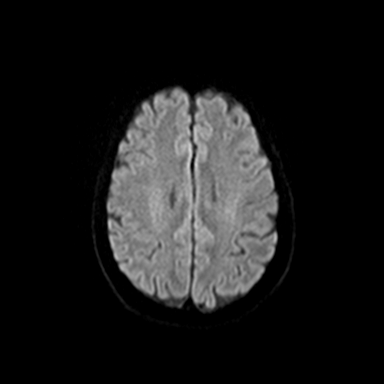
[im 52/52]
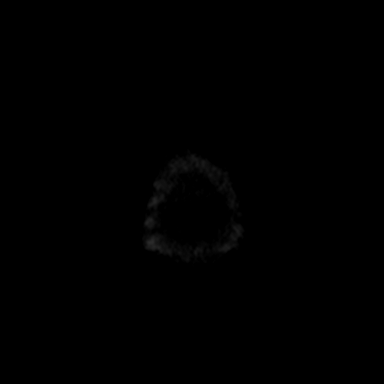

[Series 8: T2 · axial · 5.0mm · 0.53mm/px · z∈[-42,+96]mm · 2 of 24 slices shown]
[im 1/24]
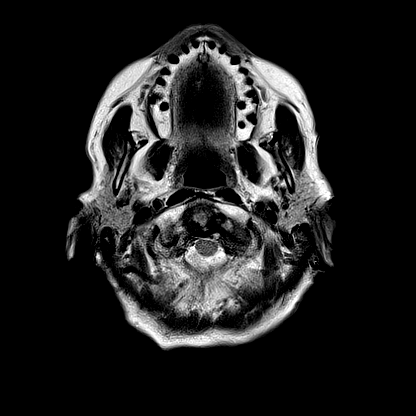
[im 24/24]
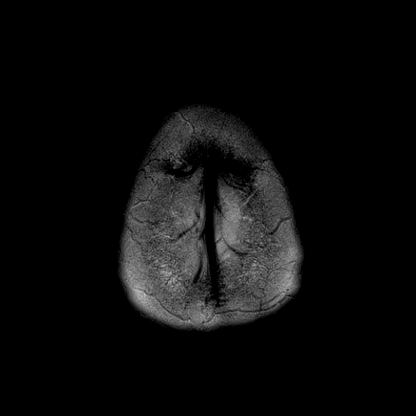

[Series 13: T1 · coronal · non-contrast · 3.0mm · 0.21mm/px · 1 of 12 slices shown (2 of 3)]
[im 1/12]
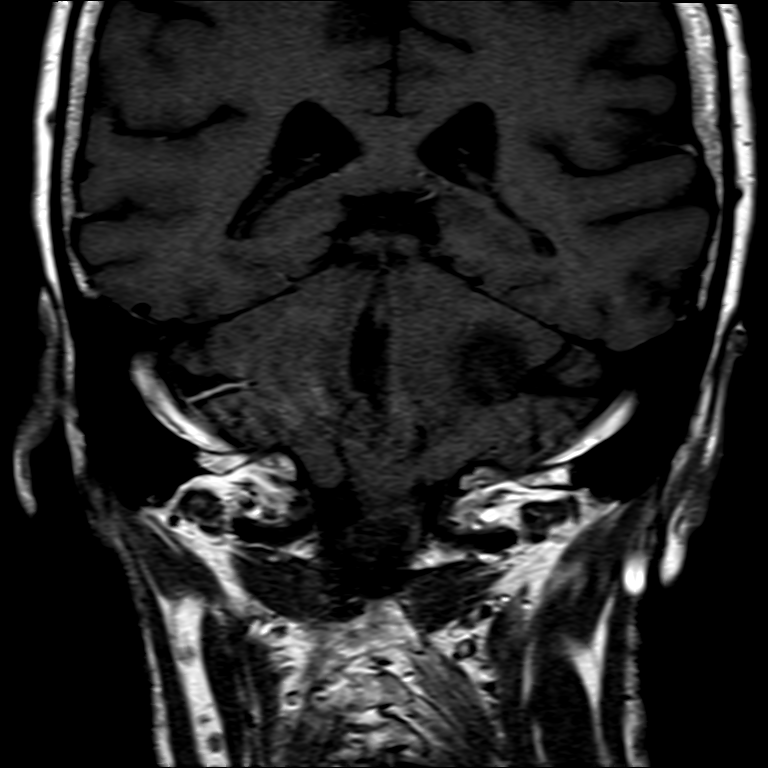

[Series 14: FLAIR · axial · 3.0mm · 0.53mm/px · z∈[-54,+108]mm · 4 of 55 slices shown]
[im 1/55]
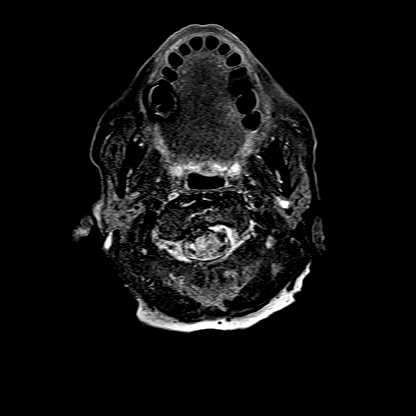
[im 19/55]
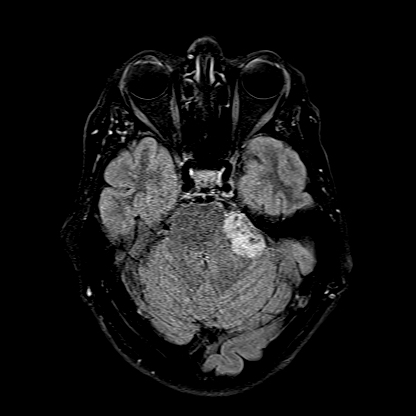
[im 37/55]
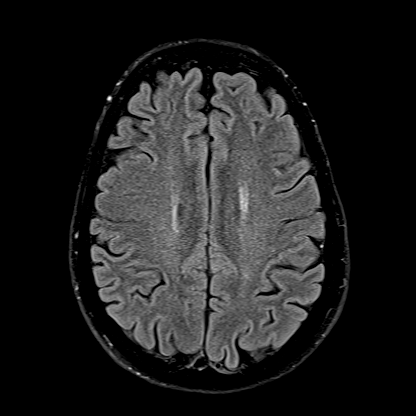
[im 55/55]
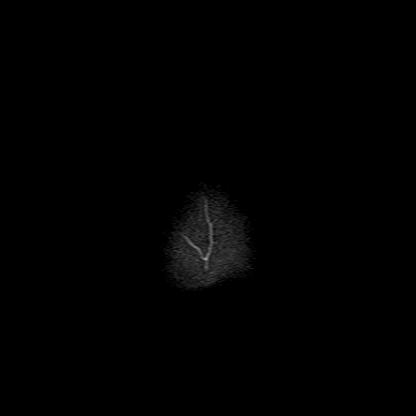

[Series 16: T1 · axial · non-contrast · 3.0mm · 0.21mm/px · 1 of 15 slices shown (3 of 3)]
[im 1/15]
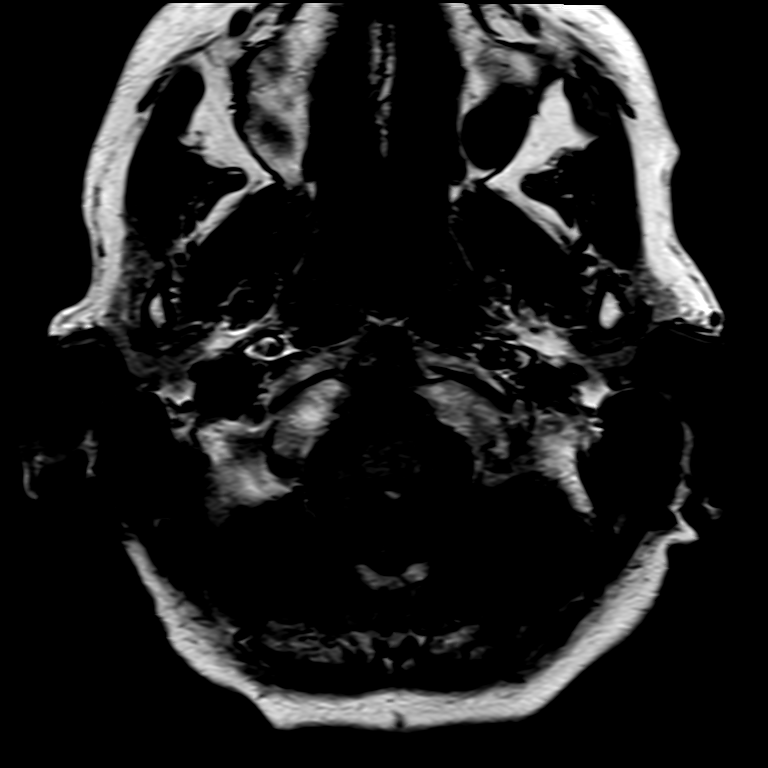

[Series 17: T1 post-contrast · axial · 3.0mm · 0.21mm/px · 1 of 15 slices shown (1 of 4)]
[im 1/15]
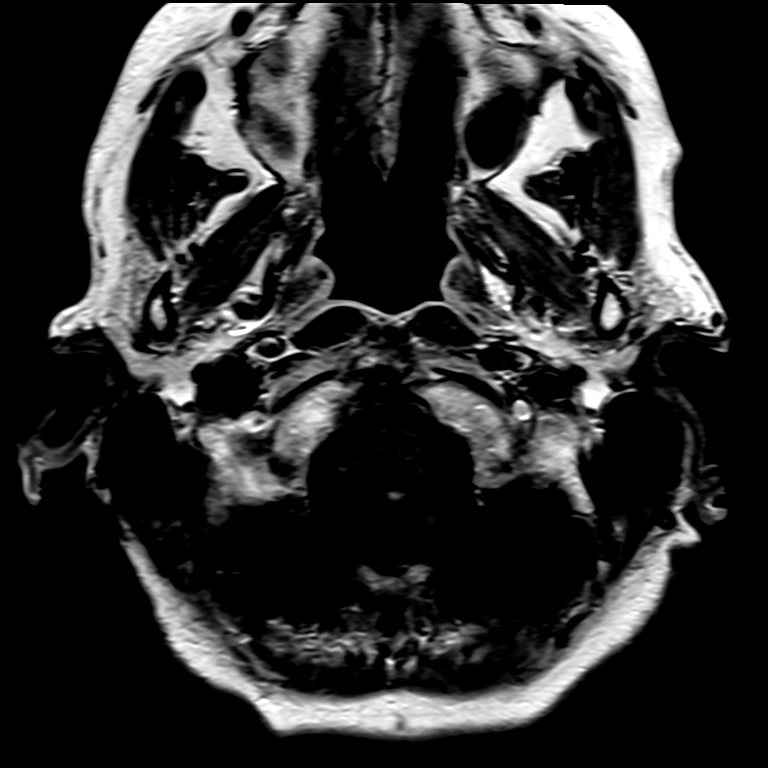

[Series 18: T1 post-contrast · coronal · 3.0mm · 0.21mm/px · 1 of 12 slices shown (2 of 4)]
[im 1/12]
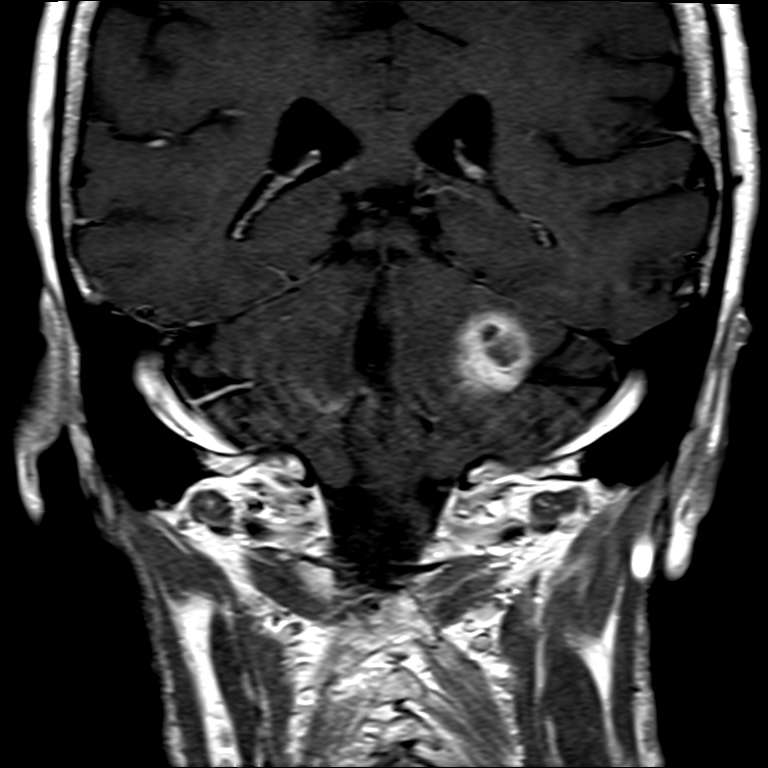

[Series 19: T1 post-contrast · axial · 1.0mm · 0.98mm/px · z∈[-60,+115]mm · 9 of 176 slices shown (3 of 4)]
[im 1/176]
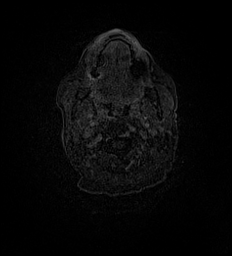
[im 32/176]
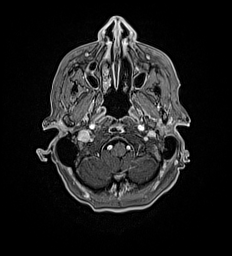
[im 48/176]
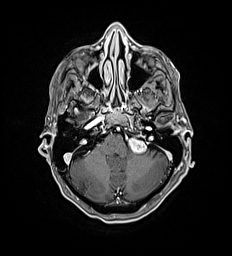
[im 80/176]
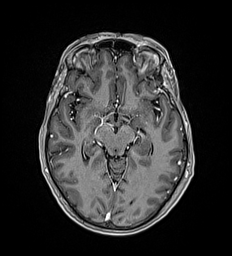
[im 96/176]
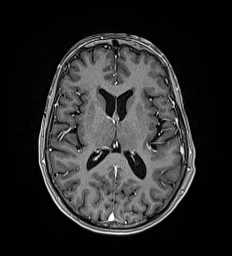
[im 128/176]
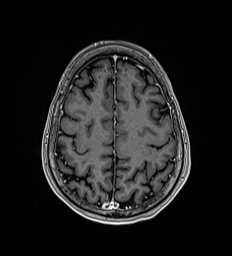
[im 144/176]
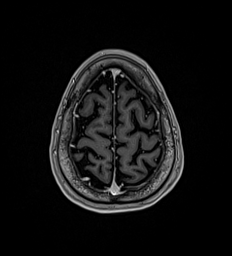
[im 160/176]
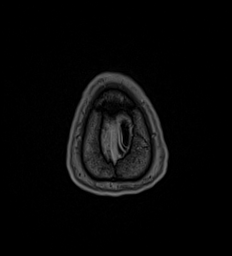
[im 176/176]
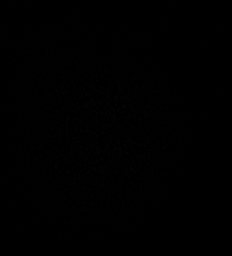

[Series 20: T1 post-contrast · sagittal · 5.0mm · 0.62mm/px · 2 of 25 slices shown (4 of 4)]
[im 1/25]
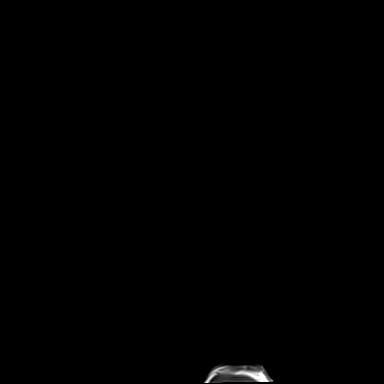
[im 25/25]
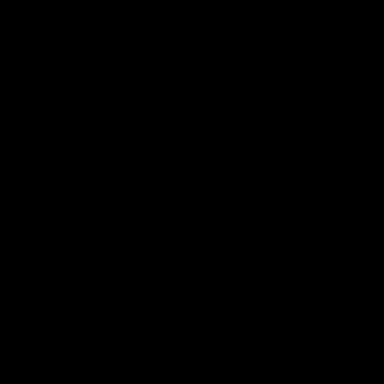

[26 of 48 positions shown; findings below may reference images not displayed]

FINDINGS: Brain: IAC protocol with thin sections through the posterior fossa.

Large enhancing mass in the left cerebellar pontine angle cistern
extends into the left internal auditory canal. The mass is partially
calcified and contains internal microcystic changes. No brain edema.
The mass shows relatively intense enhancement aside from the areas
of cystic change. The mass measures 31 x 30 mm including the
intracanalicular component. Findings compatible with vestibular
schwannoma.

2 mm enhancing nodule in the fundus of the right internal auditory
canal posteriorly, most likely vestibular schwannoma.

Ventricle size normal. Scattered small white matter hyperintensities
compatible with minimal chronic ischemia. No acute infarct or
hemorrhage.

Vascular: Normal arterial flow voids.

Skull and upper cervical spine: Negative

Sinuses/Orbits: Mild mucosal edema paranasal sinuses. Bilateral
cataract surgery.

Other: None
IMPRESSION: Large vestibular schwannoma on the left with mass-effect on the
brainstem. The mass is predominantly in the cistern however there is
a component extending into the internal auditory canal

2 mm enhancing nodule in the fundus of the right internal auditory
canal mass compatible with vestibular schwannoma. This raises the
possibility of neurofibromatosis type 2.

## 2020-05-05 ENCOUNTER — Telehealth: Payer: Self-pay | Admitting: Internal Medicine

## 2020-05-05 MED ORDER — SERTRALINE HCL 100 MG PO TABS
100.0000 mg | ORAL_TABLET | Freq: Every day | ORAL | 2 refills | Status: DC
Start: 2020-05-05 — End: 2020-08-20

## 2020-05-05 NOTE — Telephone Encounter (Signed)
Yes,, if she would like to discuss alternatives

## 2020-05-05 NOTE — Telephone Encounter (Signed)
YES.  DOSE INCREASED TO 100 MG DAILY.  FOLLOW UP AS NEEDED

## 2020-05-05 NOTE — Telephone Encounter (Signed)
Spoke with pt and she stated that she would like to see if it would be okay to increase her dose of Zoloft?

## 2020-05-05 NOTE — Telephone Encounter (Signed)
Patient called in stated that the medication Dr.Tullo prescribed her for anxiety it is not working anymore it feel she is not taking anything this is the medicationsertraline (ZOLOFT) 50 MG tablet

## 2020-05-05 NOTE — Telephone Encounter (Signed)
Would you like for me to schedule her a follow up to discuss?

## 2020-05-06 NOTE — Telephone Encounter (Signed)
Spoke with pt to inform her that she can increase her zoloft to 100 mg daily. Pt gave a verbal understanding.

## 2020-05-22 ENCOUNTER — Ambulatory Visit (INDEPENDENT_AMBULATORY_CARE_PROVIDER_SITE_OTHER): Payer: Medicare Other

## 2020-05-22 VITALS — Ht 62.5 in | Wt 110.0 lb

## 2020-05-22 DIAGNOSIS — Z0000 Encounter for general adult medical examination without abnormal findings: Secondary | ICD-10-CM

## 2020-05-22 NOTE — Progress Notes (Signed)
Subjective:   Rita Taylor is a 80 y.o. female who presents for Medicare Annual (Subsequent) preventive examination.  Review of Systems    No ROS.  Medicare Wellness Virtual Visit.    Cardiac Risk Factors include: advanced age (>44men, >62 women);hypertension     Objective:    Today's Vitals   05/22/20 0905  Weight: 110 lb (49.9 kg)  Height: 5' 2.5" (1.588 m)   Body mass index is 19.8 kg/m.  Advanced Directives 05/22/2020 01/24/2020 03/02/2019 01/23/2015  Does Patient Have a Medical Advance Directive? Yes Yes Yes No  Type of Advance Directive Living will;Healthcare Power of [place];Living will Living will;Healthcare Power of Attorney -  Does patient want to make changes to medical advance directive? No - Patient declined No - Patient declined No - Patient declined -  Copy of [place] in Chart? Yes - validated most recent copy scanned in chart (See row information) Yes - validated most recent copy scanned in chart (See row information) No - copy requested -  Would patient like information on creating a medical advance directive? - - - Yes - Educational materials given    Current Medications (verified) Outpatient Encounter Medications as of 05/22/2020  Medication Sig  . amLODipine (NORVASC) 5 MG tablet Take 1 tablet (5 mg total) by mouth daily. TAKE 1 TABLET(2.5 MG) BY MOUTH DAILY  . ASPIRIN 81 PO Take by mouth daily.  . Biotin w/ Vitamins C & E (HAIR/SKIN/NAILS PO) Take by mouth daily.  . Calcium Carbonate-Vit D-Min (CALCIUM 1200) 1200-1000 MG-UNIT CHEW Chew 1 tablet by mouth daily.  . Cholecalciferol (VITAMIN D-3) 25 MCG (1000 UT) CAPS Take by mouth.  . denosumab (PROLIA) 60 MG/ML SOSY injection Inject into the skin.  Marland Kitchen erythromycin ophthalmic ointment Apply to sutures 4 times a day for 10-12 days.  Discontinue if allergy develops and call our office  . losartan (COZAAR) 100 MG tablet TAKE 1 TABLET(100 MG) BY MOUTH DAILY  .  Omega-3 Fatty Acids (FISH OIL) 1000 MG CAPS Take 1 capsule by mouth 2 (two) times daily.  . rosuvastatin (CRESTOR) 20 MG tablet Take 1 tablet (20 mg total) by mouth every other day.  . sertraline (ZOLOFT) 100 MG tablet Take 1 tablet (100 mg total) by mouth daily.  . traMADol (ULTRAM) 50 MG tablet Take 1 every 4-6 hours as needed for pain not controlled by Tylenol (Patient not taking: No sig reported)  . Turmeric 500 MG CAPS Take 1 capsule by mouth 2 (two) times daily.   No facility-administered encounter medications on file as of 05/22/2020.    Allergies (verified) Fosamax [alendronate], Pseudoephedrine-dm-gg, and Risedronate sodium   History: Past Medical History:  Diagnosis Date  . History of shingles   . Hyperlipidemia   . Hypertension   . Melanoma ([place]) 2008   removed by Koleen Nimrod, [place] excision by [facility] left flank  . 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 Postoperative anemia 07/22/2018  . 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 bitubal ligation  1981  . BREAST ENHANCEMENT SURGERY  1990  . BROW LIFT Left 01/24/2020   Procedure: TARSORRHAPHY, LATERAL PLACEMENT LEFT LOWER LID;  Surgeon: Karle Starch, MD;  Location: [place];  Service: Ophthalmology;  Laterality: Left;  . COLONOSCOPY    . ECTROPION REPAIR Left 03/02/2019   Procedure: ECTROPION REPAIR, EXTENSIVE AND TARSORRHAPHY, LATERAL PLACEMENT OF LEFT LOWER LID;  Surgeon: Karle Starch, MD;  Location: [place];  Service: Ophthalmology;  Laterality: Left;  . FACIAL COSMETIC SURGERY    . GYNECOLOGIC CRYOSURGERY     15 years ago, normal since then, was treated with antibiotics, Annual pap smears for the past 20 years  . TUMOR EXCISION Left 06/2018   ear   Family History  Problem Relation Age of Onset  . Hypertension Mother   . Hypertension Father   . Hypertension Brother    Social History   Socioeconomic History  . Marital status: Married    Spouse name: Not  on file  . Number of children: Not on file  . Years of education: Not on file  . Highest education level: Not on file  Occupational History  . Not on file  Tobacco Use  . Smoking status: Never Smoker  . Smokeless tobacco: Never Used  Vaping Use  . Vaping Use: Never used  Substance and Sexual Activity  . Alcohol use: Yes    Comment: moderate half a glass daily  . Drug use: No  . Sexual activity: Yes  Other Topics Concern  . Not on file  Social History Narrative   Lives with spouse.   Has 2 dogs and one cat, sold a horse farm years ago.   Exercises regularly 5 days a week at [employer] and also walks the dog   Social Determinants of Health   Financial Resource Strain: Low Risk   . Difficulty of Paying Living Expenses: Not hard at all  Food Insecurity: No Food Insecurity  . Worried About Charity fundraiser in the Last Year: Never true  . Ran Out of Food in the Last Year: Never true  Transportation Needs: No Transportation Needs  . Lack of Transportation (Medical): No  . Lack of Transportation (Non-Medical): No  Physical Activity: Not on file  Stress: No Stress Concern Present  . Feeling of Stress : Not at all  Social Connections: Unknown  . Frequency of Communication with Friends and Family: Not on file  . Frequency of Social Gatherings with Friends and Family: Not on file  . Attends Religious Services: Not on file  . Active Member of Clubs or Organizations: Not on file  . Attends Archivist Meetings: Not on file  . Marital Status: Married    Tobacco Counseling Counseling given: Not Answered   Clinical Intake:  Pre-visit preparation completed: Yes        Diabetes: No  How often do you need to have someone help you when you read instructions, pamphlets, or other written materials from your doctor or pharmacy?: 1 - Never  Interpreter Needed?: No      Activities of Daily Living In your present state of health, do you have any difficulty performing  the following activities: 05/22/2020 01/24/2020  Hearing? Y N  Comment Hearing loss L ear -  Vision? N N  Difficulty concentrating or making decisions? N N  Walking or climbing stairs? N N  Dressing or bathing? N -  Doing errands, shopping? N -  Preparing Food and eating ? N -  Using the Toilet? N -  In the past six months, have you accidently leaked urine? N -  Do you have problems with loss of bowel control? N -  Managing your Medications? N -  Managing your Finances? N -  Housekeeping or managing your Housekeeping? N -  Some recent data might be hidden    Patient Care Team: Crecencio Mc, MD as PCP - General (  Internal Medicine)  Indicate any recent Medical Services you may have received from other than [facility] providers in the past year (date may be approximate).     Assessment:   This is a routine wellness examination for Haskins.  I connected with Veena today by telephone and verified that I am speaking with the correct person using two identifiers. Location patient: home Location provider: work Persons participating in the virtual visit: patient, Marine scientist.    I discussed the limitations, risks, security and privacy concerns of performing an evaluation and management service by telephone and the availability of in person appointments. The patient expressed understanding and verbally consented to this telephonic visit.    Interactive audio and video telecommunications were attempted between this provider and patient, however failed, due to patient having technical difficulties OR patient did not have access to video capability.  We continued and completed visit with audio only.  Some vital signs may be absent or patient reported.   Hearing/Vision screen  Hearing Screening   125Hz  250Hz  500Hz  1000Hz  2000Hz  3000Hz  4000Hz  6000Hz  8000Hz   Right ear:           Left ear:           Comments: Hearing loss L ear   Vision Screening Comments: Visual acuity not assessed, virtual  visit.  They have seen their ophthalmologist in the last 12 months.     Dietary issues and exercise activities discussed: Current Exercise Habits: Home exercise routine  Healthy diet Drinks plenty of fluids   Goals      Patient Stated   .  maintain healthy lifestyle (pt-stated)      Stay active Stay hydrated Healthy diet      Depression Screen PHQ 2/9 Scores 05/22/2020 02/18/2020 02/14/2019 07/14/2016 01/23/2015 01/06/2015 11/12/2013  PHQ - 2 Score 0 0 0 0 0 0 0  PHQ- 9 Score - 0 0 - - - -    Fall Risk Fall Risk  05/22/2020 02/18/2020 08/17/2019 02/14/2019 10/18/2018  Falls in the past year? 0 0 0 0 0  Comment - - - - -  Number falls in past yr: 0 - - - 0  Comment - - - - -  Injury with Fall? 0 - - - 0  Follow up Falls evaluation completed Falls evaluation completed Falls evaluation completed Falls evaluation completed Falls evaluation completed    [place]: Handrails in use when climbing stairs? Yes Home free of loose throw rugs in walkways, pet beds, electrical cords, etc? Yes  Adequate lighting in your home to reduce risk of falls? Yes   ASSISTIVE DEVICES UTILIZED TO PREVENT FALLS: Use of a cane, walker or w/c? No   TIMED UP AND GO: Was the test performed? No . Virtual visit.  Cognitive Function: Patient is alert and oriented x3.  Denies difficulty focusing, making decisions, memory loss.  Enjoys reading and crossword puzzles daily.  MMSE/6CIT deferred. Normal by direct communication/observation.  MMSE - Mini Mental State Exam 01/23/2015  Orientation to time 5  Orientation to Place 5  Registration 3  Attention/ Calculation 5  Recall 3  Language- name 2 objects 2  Language- repeat 1  Language- follow 3 step command 3  Language- read & follow direction 1  Write a sentence 1  Copy design 1  Total score 30        Immunizations Immunization History  Administered Date(s) Administered  . Influenza Split 11/15/2011  . Influenza,  High Dose Seasonal PF  11/10/2017  . Influenza,inj,Quad PF,6+ Mos 11/08/2012, 11/12/2013  . Influenza-Unspecified 11/06/2014, 10/30/2015, 10/11/2018, 11/29/2019  . Moderna Sars-Covid-2 Vaccination 03/24/2019, 04/14/2019, 12/20/2019  . Pneumococcal Conjugate-13 11/12/2013  . Pneumococcal Polysaccharide-23 11/08/2012  . Tdap 10/13/2011  . Zoster 07/03/2008    Health Maintenance Health Maintenance  Topic Date Due  . COVID-19 Vaccine (4 - Booster) 06/18/2020  . INFLUENZA VACCINE  09/15/2020  . TETANUS/TDAP  10/12/2021  . DEXA SCAN  Completed  . PNA vac Low Risk Adult  Completed  . HPV VACCINES  Aged Out  . MAMMOGRAM  Discontinued   Colorectal cancer screening: No longer required.   Mammogram- discontinued per patient.  Lung Cancer Screening: (Low Dose CT Chest recommended if Age 51-80 years, 30 pack-year currently smoking OR have quit w/in 15years.) does not qualify.   Hepatitis C Screening: does not qualify.  Vision Screening: Recommended annual ophthalmology exams for early detection of glaucoma and other disorders of the eye. Is the patient up to date with their annual eye exam?  Yes  Who is the provider or what is the name of the office in which the patient attends annual eye exams? [facility], Dr. Jeni Salles.  Dental Screening: Recommended annual dental exams for proper oral hygiene.  Community Resource Referral / Chronic Care Management: CRR required this visit?  No   CCM required this visit?  No      Plan:   Keep all routine maintenance appointments.   Follow up 09/10/20 @ 9:00  I have personally reviewed and noted the following in the patient's chart:   . Medical and social history . Use of alcohol, tobacco or illicit drugs  . Current medications and supplements . Functional ability and status . Nutritional status . Physical activity . Advanced directives . List of other physicians . Hospitalizations, surgeries, and ER visits in previous 12  months . Vitals . Screenings to include cognitive, depression, and falls . Referrals and appointments  In addition, I have reviewed and discussed with patient certain preventive protocols, quality metrics, and best practice recommendations. A written personalized care plan for preventive services as well as general preventive health recommendations were provided to patient via mychart.     Varney Biles, LPN   09/15/8561

## 2020-05-22 NOTE — Patient Instructions (Addendum)
Rita Taylor , Thank you for taking time to come for your Medicare Wellness Visit. I appreciate your ongoing commitment to your health goals. Please review the following plan we discussed and let me know if I can assist you in the future.   These are the goals we discussed: Goals      Patient Stated   .  maintain healthy lifestyle (pt-stated)      Stay active Stay hydrated Healthy diet       This is a list of the screening recommended for you and due dates:  Health Maintenance  Topic Date Due  . COVID-19 Vaccine (4 - Booster) 06/18/2020  . Flu Shot  09/15/2020  . Tetanus Vaccine  10/12/2021  . DEXA scan (bone density measurement)  Completed  . Pneumonia vaccines  Completed  . HPV Vaccine  Aged Out  . Mammogram  Discontinued   Keep all routine maintenance appointments.   Follow up 09/10/20 @ 9:00  Advanced directives: on file  Conditions/risks identified: none new  Follow up in one year for your annual wellness visit    Preventive Care 65 Years and Older, Female Preventive care refers to lifestyle choices and visits with your health care provider that can promote health and wellness. What does preventive care include?  A yearly physical exam. This is also called an annual well check.  Dental exams once or twice a year.  Routine eye exams. Ask your health care provider how often you should have your eyes checked.  Personal lifestyle choices, including:  Daily care of your teeth and gums.  Regular physical activity.  Eating a healthy diet.  Avoiding tobacco and drug use.  Limiting alcohol use.  Practicing safe sex.  Taking low-dose aspirin every day.  Taking vitamin and mineral supplements as recommended by your health care provider. What happens during an annual well check? The services and screenings done by your health care provider during your annual well check will depend on your age, overall health, lifestyle risk factors, and family history of  disease. Counseling  Your health care provider may ask you questions about your:  Alcohol use.  Tobacco use.  Drug use.  Emotional well-being.  Home and relationship well-being.  Sexual activity.  Eating habits.  History of falls.  Memory and ability to understand (cognition).  Work and work Statistician.  Reproductive health. Screening  You may have the following tests or measurements:  Height, weight, and BMI.  Blood pressure.  Lipid and cholesterol levels. These may be checked every 5 years, or more frequently if you are over 31 years old.  Skin check.  Lung cancer screening. You may have this screening every year starting at age 72 if you have a 30-pack-year history of smoking and currently smoke or have quit within the past 15 years.  Fecal occult blood test (FOBT) of the stool. You may have this test every year starting at age 41.  Flexible sigmoidoscopy or colonoscopy. You may have a sigmoidoscopy every 5 years or a colonoscopy every 10 years starting at age 48.  Hepatitis C blood test.  Hepatitis B blood test.  Sexually transmitted disease (STD) testing.  Diabetes screening. This is done by checking your blood sugar (glucose) after you have not eaten for a while (fasting). You may have this done every 1-3 years.  Bone density scan. This is done to screen for osteoporosis. You may have this done starting at age 65.  Mammogram. This may be done every 1-2 years. Talk  to your health care provider about how often you should have regular mammograms. Talk with your health care provider about your test results, treatment options, and if necessary, the need for more tests. Vaccines  Your health care provider may recommend certain vaccines, such as:  Influenza vaccine. This is recommended every year.  Tetanus, diphtheria, and acellular pertussis (Tdap, Td) vaccine. You may need a Td booster every 10 years.  Zoster vaccine. You may need this after age  54.  Pneumococcal 13-valent conjugate (PCV13) vaccine. One dose is recommended after age 55.  Pneumococcal polysaccharide (PPSV23) vaccine. One dose is recommended after age 59. Talk to your health care provider about which screenings and vaccines you need and how often you need them. This information is not intended to replace advice given to you by your health care provider. Make sure you discuss any questions you have with your health care provider. Document Released: 02/28/2015 Document Revised: 10/22/2015 Document Reviewed: 12/03/2014 Elsevier Interactive Patient Education  2017 [place] Prevention in the Home Falls can cause injuries. They can happen to people of all ages. There are many things you can do to make your home safe and to help prevent falls. What can I do on the outside of my home?  Regularly fix the edges of walkways and driveways and fix any cracks.  Remove anything that might make you trip as you walk through a door, such as a raised step or threshold.  Trim any bushes or trees on the path to your home.  Use bright outdoor lighting.  Clear any walking paths of anything that might make someone trip, such as rocks or tools.  Regularly check to see if handrails are loose or broken. Make sure that both sides of any steps have handrails.  Any raised decks and porches should have guardrails on the edges.  Have any leaves, snow, or ice cleared regularly.  Use sand or salt on walking paths during winter.  Clean up any spills in your garage right away. This includes oil or grease spills. What can I do in the bathroom?  Use night lights.  Install grab bars by the toilet and in the tub and shower. Do not use towel bars as grab bars.  Use non-skid mats or decals in the tub or shower.  If you need to sit down in the shower, use a plastic, non-slip stool.  Keep the floor dry. Clean up any water that spills on the floor as soon as it happens.  Remove  soap buildup in the tub or shower regularly.  Attach bath mats securely with double-sided non-slip rug tape.  Do not have throw rugs and other things on the floor that can make you trip. What can I do in the bedroom?  Use night lights.  Make sure that you have a light by your bed that is easy to reach.  Do not use any sheets or blankets that are too big for your bed. They should not hang down onto the floor.  Have a firm chair that has side arms. You can use this for support while you get dressed.  Do not have throw rugs and other things on the floor that can make you trip. What can I do in the kitchen?  Clean up any spills right away.  Avoid walking on wet floors.  Keep items that you use a lot in easy-to-reach places.  If you need to reach something above you, use a strong step stool that  has a grab bar.  Keep electrical cords out of the way.  Do not use floor polish or wax that makes floors slippery. If you must use wax, use non-skid floor wax.  Do not have throw rugs and other things on the floor that can make you trip. What can I do with my stairs?  Do not leave any items on the stairs.  Make sure that there are handrails on both sides of the stairs and use them. Fix handrails that are broken or loose. Make sure that handrails are as long as the stairways.  Check any carpeting to make sure that it is firmly attached to the stairs. Fix any carpet that is loose or worn.  Avoid having throw rugs at the top or bottom of the stairs. If you do have throw rugs, attach them to the floor with carpet tape.  Make sure that you have a light switch at the top of the stairs and the bottom of the stairs. If you do not have them, ask someone to add them for you. What else can I do to help prevent falls?  Wear shoes that:  Do not have high heels.  Have rubber bottoms.  Are comfortable and fit you well.  Are closed at the toe. Do not wear sandals.  If you use a  stepladder:  Make sure that it is fully opened. Do not climb a closed stepladder.  Make sure that both sides of the stepladder are locked into place.  Ask someone to hold it for you, if possible.  Clearly mark and make sure that you can see:  Any grab bars or handrails.  First and last steps.  Where the edge of each step is.  Use tools that help you move around (mobility aids) if they are needed. These include:  Canes.  Walkers.  Scooters.  Crutches.  Turn on the lights when you go into a dark area. Replace any light bulbs as soon as they burn out.  Set up your furniture so you have a clear path. Avoid moving your furniture around.  If any of your floors are uneven, fix them.  If there are any pets around you, be aware of where they are.  Review your medicines with your doctor. Some medicines can make you feel dizzy. This can increase your chance of falling. Ask your doctor what other things that you can do to help prevent falls. This information is not intended to replace advice given to you by your health care provider. Make sure you discuss any questions you have with your health care provider. Document Released: 11/28/2008 Document Revised: 07/10/2015 Document Reviewed: 03/08/2014 Elsevier Interactive Patient Education  2017 [employer].

## 2020-05-24 ENCOUNTER — Other Ambulatory Visit: Payer: Self-pay | Admitting: Internal Medicine

## 2020-05-27 DIAGNOSIS — D333 Benign neoplasm of cranial nerves: Secondary | ICD-10-CM | POA: Diagnosis not present

## 2020-05-27 DIAGNOSIS — H903 Sensorineural hearing loss, bilateral: Secondary | ICD-10-CM | POA: Diagnosis not present

## 2020-05-27 DIAGNOSIS — D3611 Benign neoplasm of peripheral nerves and autonomic nervous system of face, head, and neck: Secondary | ICD-10-CM | POA: Diagnosis not present

## 2020-06-06 DIAGNOSIS — D333 Benign neoplasm of cranial nerves: Secondary | ICD-10-CM | POA: Diagnosis not present

## 2020-06-06 DIAGNOSIS — E236 Other disorders of pituitary gland: Secondary | ICD-10-CM | POA: Diagnosis not present

## 2020-07-02 DIAGNOSIS — G510 Bell's palsy: Secondary | ICD-10-CM | POA: Diagnosis not present

## 2020-07-02 DIAGNOSIS — D3611 Benign neoplasm of peripheral nerves and autonomic nervous system of face, head, and neck: Secondary | ICD-10-CM | POA: Diagnosis not present

## 2020-07-08 DIAGNOSIS — H168 Other keratitis: Secondary | ICD-10-CM | POA: Diagnosis not present

## 2020-07-15 ENCOUNTER — Other Ambulatory Visit: Payer: Self-pay | Admitting: Internal Medicine

## 2020-07-23 DIAGNOSIS — R29810 Facial weakness: Secondary | ICD-10-CM | POA: Diagnosis not present

## 2020-07-23 DIAGNOSIS — E7800 Pure hypercholesterolemia, unspecified: Secondary | ICD-10-CM | POA: Diagnosis not present

## 2020-07-23 DIAGNOSIS — R519 Headache, unspecified: Secondary | ICD-10-CM | POA: Diagnosis not present

## 2020-07-23 DIAGNOSIS — Z6820 Body mass index (BMI) 20.0-20.9, adult: Secondary | ICD-10-CM | POA: Diagnosis not present

## 2020-07-23 DIAGNOSIS — I10 Essential (primary) hypertension: Secondary | ICD-10-CM | POA: Diagnosis not present

## 2020-07-23 DIAGNOSIS — D333 Benign neoplasm of cranial nerves: Secondary | ICD-10-CM | POA: Diagnosis not present

## 2020-08-04 DIAGNOSIS — R519 Headache, unspecified: Secondary | ICD-10-CM | POA: Diagnosis not present

## 2020-08-04 DIAGNOSIS — D333 Benign neoplasm of cranial nerves: Secondary | ICD-10-CM | POA: Diagnosis not present

## 2020-08-04 DIAGNOSIS — R29810 Facial weakness: Secondary | ICD-10-CM | POA: Diagnosis not present

## 2020-08-04 DIAGNOSIS — E236 Other disorders of pituitary gland: Secondary | ICD-10-CM | POA: Diagnosis not present

## 2020-08-04 DIAGNOSIS — I10 Essential (primary) hypertension: Secondary | ICD-10-CM | POA: Diagnosis not present

## 2020-08-04 DIAGNOSIS — E7800 Pure hypercholesterolemia, unspecified: Secondary | ICD-10-CM | POA: Diagnosis not present

## 2020-08-13 ENCOUNTER — Other Ambulatory Visit: Payer: Self-pay | Admitting: Internal Medicine

## 2020-08-13 DIAGNOSIS — D333 Benign neoplasm of cranial nerves: Secondary | ICD-10-CM | POA: Diagnosis not present

## 2020-08-13 DIAGNOSIS — R29810 Facial weakness: Secondary | ICD-10-CM | POA: Diagnosis not present

## 2020-08-13 DIAGNOSIS — R519 Headache, unspecified: Secondary | ICD-10-CM | POA: Diagnosis not present

## 2020-08-13 DIAGNOSIS — E7800 Pure hypercholesterolemia, unspecified: Secondary | ICD-10-CM | POA: Diagnosis not present

## 2020-08-13 DIAGNOSIS — I10 Essential (primary) hypertension: Secondary | ICD-10-CM

## 2020-08-14 DIAGNOSIS — I10 Essential (primary) hypertension: Secondary | ICD-10-CM | POA: Diagnosis not present

## 2020-08-14 DIAGNOSIS — D333 Benign neoplasm of cranial nerves: Secondary | ICD-10-CM | POA: Diagnosis not present

## 2020-08-14 DIAGNOSIS — R519 Headache, unspecified: Secondary | ICD-10-CM | POA: Diagnosis not present

## 2020-08-14 DIAGNOSIS — E7800 Pure hypercholesterolemia, unspecified: Secondary | ICD-10-CM | POA: Diagnosis not present

## 2020-08-14 DIAGNOSIS — R29810 Facial weakness: Secondary | ICD-10-CM | POA: Diagnosis not present

## 2020-08-14 DIAGNOSIS — D3611 Benign neoplasm of peripheral nerves and autonomic nervous system of face, head, and neck: Secondary | ICD-10-CM | POA: Diagnosis not present

## 2020-08-15 DIAGNOSIS — R29810 Facial weakness: Secondary | ICD-10-CM | POA: Diagnosis not present

## 2020-08-15 DIAGNOSIS — D333 Benign neoplasm of cranial nerves: Secondary | ICD-10-CM | POA: Diagnosis not present

## 2020-08-15 DIAGNOSIS — E7800 Pure hypercholesterolemia, unspecified: Secondary | ICD-10-CM | POA: Diagnosis not present

## 2020-08-15 DIAGNOSIS — R519 Headache, unspecified: Secondary | ICD-10-CM | POA: Diagnosis not present

## 2020-08-15 DIAGNOSIS — I10 Essential (primary) hypertension: Secondary | ICD-10-CM | POA: Diagnosis not present

## 2020-08-19 DIAGNOSIS — R29810 Facial weakness: Secondary | ICD-10-CM | POA: Diagnosis not present

## 2020-08-19 DIAGNOSIS — E7800 Pure hypercholesterolemia, unspecified: Secondary | ICD-10-CM | POA: Diagnosis not present

## 2020-08-19 DIAGNOSIS — R519 Headache, unspecified: Secondary | ICD-10-CM | POA: Diagnosis not present

## 2020-08-19 DIAGNOSIS — D333 Benign neoplasm of cranial nerves: Secondary | ICD-10-CM | POA: Diagnosis not present

## 2020-08-19 DIAGNOSIS — I10 Essential (primary) hypertension: Secondary | ICD-10-CM | POA: Diagnosis not present

## 2020-08-20 ENCOUNTER — Other Ambulatory Visit: Payer: Self-pay | Admitting: Internal Medicine

## 2020-08-20 ENCOUNTER — Ambulatory Visit: Payer: Medicare Other | Admitting: Internal Medicine

## 2020-08-20 DIAGNOSIS — D333 Benign neoplasm of cranial nerves: Secondary | ICD-10-CM | POA: Diagnosis not present

## 2020-08-20 DIAGNOSIS — R519 Headache, unspecified: Secondary | ICD-10-CM | POA: Diagnosis not present

## 2020-08-20 DIAGNOSIS — R29810 Facial weakness: Secondary | ICD-10-CM | POA: Diagnosis not present

## 2020-08-20 DIAGNOSIS — E7800 Pure hypercholesterolemia, unspecified: Secondary | ICD-10-CM | POA: Diagnosis not present

## 2020-08-20 DIAGNOSIS — I10 Essential (primary) hypertension: Secondary | ICD-10-CM | POA: Diagnosis not present

## 2020-08-21 DIAGNOSIS — R519 Headache, unspecified: Secondary | ICD-10-CM | POA: Diagnosis not present

## 2020-08-21 DIAGNOSIS — E7800 Pure hypercholesterolemia, unspecified: Secondary | ICD-10-CM | POA: Diagnosis not present

## 2020-08-21 DIAGNOSIS — I10 Essential (primary) hypertension: Secondary | ICD-10-CM | POA: Diagnosis not present

## 2020-08-21 DIAGNOSIS — D333 Benign neoplasm of cranial nerves: Secondary | ICD-10-CM | POA: Diagnosis not present

## 2020-08-21 DIAGNOSIS — R29810 Facial weakness: Secondary | ICD-10-CM | POA: Diagnosis not present

## 2020-09-03 ENCOUNTER — Other Ambulatory Visit: Payer: Self-pay | Admitting: Internal Medicine

## 2020-09-10 ENCOUNTER — Ambulatory Visit (INDEPENDENT_AMBULATORY_CARE_PROVIDER_SITE_OTHER): Payer: Medicare Other | Admitting: Internal Medicine

## 2020-09-10 ENCOUNTER — Other Ambulatory Visit: Payer: Self-pay

## 2020-09-10 ENCOUNTER — Encounter: Payer: Self-pay | Admitting: Internal Medicine

## 2020-09-10 VITALS — BP 130/72 | HR 79 | Temp 96.2°F | Resp 15 | Ht 62.5 in | Wt 110.4 lb

## 2020-09-10 DIAGNOSIS — Z86010 Personal history of colonic polyps: Secondary | ICD-10-CM | POA: Diagnosis not present

## 2020-09-10 DIAGNOSIS — E7800 Pure hypercholesterolemia, unspecified: Secondary | ICD-10-CM | POA: Diagnosis not present

## 2020-09-10 DIAGNOSIS — R151 Fecal smearing: Secondary | ICD-10-CM | POA: Diagnosis not present

## 2020-09-10 DIAGNOSIS — R5383 Other fatigue: Secondary | ICD-10-CM

## 2020-09-10 DIAGNOSIS — R7303 Prediabetes: Secondary | ICD-10-CM

## 2020-09-10 DIAGNOSIS — D333 Benign neoplasm of cranial nerves: Secondary | ICD-10-CM

## 2020-09-10 DIAGNOSIS — G510 Bell's palsy: Secondary | ICD-10-CM | POA: Diagnosis not present

## 2020-09-10 DIAGNOSIS — I2510 Atherosclerotic heart disease of native coronary artery without angina pectoris: Secondary | ICD-10-CM | POA: Diagnosis not present

## 2020-09-10 DIAGNOSIS — I10 Essential (primary) hypertension: Secondary | ICD-10-CM | POA: Diagnosis not present

## 2020-09-10 DIAGNOSIS — F411 Generalized anxiety disorder: Secondary | ICD-10-CM | POA: Diagnosis not present

## 2020-09-10 LAB — CBC WITH DIFFERENTIAL/PLATELET
Basophils Absolute: 0 10*3/uL (ref 0.0–0.1)
Basophils Relative: 0.7 % (ref 0.0–3.0)
Eosinophils Absolute: 0 10*3/uL (ref 0.0–0.7)
Eosinophils Relative: 0.7 % (ref 0.0–5.0)
HCT: 35.5 % — ABNORMAL LOW (ref 36.0–46.0)
Hemoglobin: 11.8 g/dL — ABNORMAL LOW (ref 12.0–15.0)
Lymphocytes Relative: 27.7 % (ref 12.0–46.0)
Lymphs Abs: 1.8 10*3/uL (ref 0.7–4.0)
MCHC: 33.2 g/dL (ref 30.0–36.0)
MCV: 89.3 fl (ref 78.0–100.0)
Monocytes Absolute: 0.4 10*3/uL (ref 0.1–1.0)
Monocytes Relative: 6.6 % (ref 3.0–12.0)
Neutro Abs: 4.2 10*3/uL (ref 1.4–7.7)
Neutrophils Relative %: 64.3 % (ref 43.0–77.0)
Platelets: 192 10*3/uL (ref 150.0–400.0)
RBC: 3.97 Mil/uL (ref 3.87–5.11)
RDW: 15.5 % (ref 11.5–15.5)
WBC: 6.5 10*3/uL (ref 4.0–10.5)

## 2020-09-10 LAB — COMPREHENSIVE METABOLIC PANEL
ALT: 24 U/L (ref 0–35)
AST: 26 U/L (ref 0–37)
Albumin: 4.1 g/dL (ref 3.5–5.2)
Alkaline Phosphatase: 51 U/L (ref 39–117)
BUN: 18 mg/dL (ref 6–23)
CO2: 28 mEq/L (ref 19–32)
Calcium: 9 mg/dL (ref 8.4–10.5)
Chloride: 102 mEq/L (ref 96–112)
Creatinine, Ser: 0.74 mg/dL (ref 0.40–1.20)
GFR: 76.19 mL/min (ref >60.00)
Glucose, Bld: 92 mg/dL (ref 70–99)
Potassium: 4.2 mEq/L (ref 3.5–5.1)
Sodium: 138 mEq/L (ref 135–145)
Total Bilirubin: 0.5 mg/dL (ref 0.2–1.2)
Total Protein: 6.9 g/dL (ref 6.0–8.3)

## 2020-09-10 LAB — MICROALBUMIN / CREATININE URINE RATIO
Creatinine,U: 147.2 mg/dL
Microalb Creat Ratio: 1.3 mg/g (ref 0.0–30.0)
Microalb, Ur: 1.9 mg/dL (ref 0.0–1.9)

## 2020-09-10 LAB — LIPID PANEL
Cholesterol: 215 mg/dL — ABNORMAL HIGH (ref 0–200)
HDL: 69.4 mg/dL
LDL Cholesterol: 125 mg/dL — ABNORMAL HIGH (ref 0–99)
NonHDL: 145.52
Total CHOL/HDL Ratio: 3
Triglycerides: 103 mg/dL (ref 0.0–149.0)
VLDL: 20.6 mg/dL (ref 0.0–40.0)

## 2020-09-10 LAB — HEMOGLOBIN A1C: Hgb A1c MFr Bld: 6.4 % (ref 4.6–6.5)

## 2020-09-10 NOTE — Progress Notes (Signed)
Subjective:  Patient ID: Rita Taylor, female    DOB: Aug 14, 1940  Age: 80 y.o. MRN: [id number]  CC: The primary encounter diagnosis was Primary hypertension. 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  HPI Rita Taylor presents for 6 month follow up on hypertension. GAD,  prediabetes and other issues  1) aortic atherosclerosis:  did no tolerate  Crestor.  Has resumed pravastatin 20 mg dose . discussed that she may  NEED HIGHER DOSE to keep LDL < 100  2) Hypertension: patient checks blood pressure twice weekly at home.  Readings have been for the most part < 140/80 at rest . Patient is following a reduce salt diet most days and is taking amlodipine and losartan as prescribed   3) Prediabetes  : BMI is < 25; weight is stable and She is physically active . No hypoglycemic episodes,  following a Mediterranean style diet   4) GAD/depression :  she states that she is feeling better on 100 mg zoloft despite news that her tumor is growing again and her facial paralysis has not resolved.   5) fecal soiling  still going on.  Has not had colonoscopy per patient preference. (She declined 5 yr follow up with [facility]. ) Continues to use of metamucil wafer  daily to keep stools regular and formed.  local gi referral [facility]   Outpatient Medications Prior to Visit  Medication Sig Dispense Refill   amLODipine (NORVASC) 5 MG tablet TAKE 1 TABLET BY MOUTH DAILY 90 tablet 1   ASPIRIN 81 PO Take by mouth daily.     Biotin w/ Vitamins C & E (HAIR/SKIN/NAILS PO) Take by mouth daily.     Calcium Carbonate-Vit D-Min (CALCIUM 1200) 1200-1000 MG-UNIT CHEW Chew 1 tablet by mouth daily.     Cholecalciferol (VITAMIN D-3) 25 MCG (1000 UT) CAPS Take by mouth.     denosumab (PROLIA) 60 MG/ML SOSY  injection Inject into the skin.     erythromycin ophthalmic ointment Apply to sutures 4 times a day for 10-12 days.  Discontinue if allergy develops and call our office 3.5 g 2   losartan (COZAAR) 100 MG tablet TAKE 1 TABLET(100 MG) BY MOUTH DAILY 90 tablet 1   Omega-3 Fatty Acids (FISH OIL) 1000 MG CAPS Take 1 capsule by mouth 2 (two) times daily.     sertraline (ZOLOFT) 100 MG tablet TAKE 1 TABLET(100 MG) BY MOUTH DAILY 30 tablet 2   Turmeric 500 MG CAPS Take 1 capsule by mouth 2 (two) times daily.     pravastatin (PRAVACHOL) 20 MG tablet Take 20 mg by mouth daily.     rosuvastatin (CRESTOR) 20 MG tablet Take 1 tablet (20 mg total) by mouth every other day. (Patient not taking: Reported on 09/10/2020) 45 tablet 3   traMADol (ULTRAM) 50 MG tablet Take 1 every 4-6 hours as needed for pain not controlled by Tylenol (Patient not taking: No sig reported) 6 tablet 0   No facility-administered medications prior to visit.    Review of Systems;  Patient denies headache, fevers, malaise, unintentional weight loss, skin rash, eye pain, sinus congestion and sinus pain, sore throat, dysphagia,  hemoptysis , cough, dyspnea, wheezing, chest pain, palpitations, orthopnea, edema, abdominal pain, nausea, melena, diarrhea, constipation, flank pain, dysuria, hematuria, urinary  Frequency, nocturia, numbness, tingling, seizures,  Focal weakness, Loss  of consciousness,  Tremor, insomnia, depression, anxiety, and suicidal ideation.      Objective:  BP 130/72 (BP Location: Left Arm, Patient Position: Sitting, Cuff Size: Normal)   Pulse 79   Temp (!) 96.2 F (35.7 C) (Temporal)   Resp 15   Ht 5' 2.5" (1.588 m)   Wt 110 lb 6.4 oz (50.1 kg)   SpO2 98%   BMI 19.87 kg/m   BP Readings from Last 3 Encounters:  09/10/20 130/72  02/18/20 (!) 146/70  01/24/20 (!) 152/66    Wt Readings from Last 3 Encounters:  09/10/20 110 lb 6.4 oz (50.1 kg)  05/22/20 110 lb (49.9 kg)  02/18/20 110 lb 12.8 oz (50.3 kg)     General appearance: alert, cooperative and appears stated age Ears: normal TM's and external ear canals both ears Throat: lips, mucosa, and tongue normal; teeth and gums normal Neck: no adenopathy, no carotid bruit, supple, symmetrical, trachea midline and thyroid not enlarged, symmetric, no tenderness/mass/nodules Back: symmetric, no curvature. ROM normal. No CVA tenderness. Lungs: clear to auscultation bilaterally Heart: regular rate and rhythm, S1, S2 normal, no murmur, click, rub or gallop Abdomen: soft, non-tender; bowel sounds normal; no masses,  no organomegaly Pulses: 2+ and symmetric Skin: Skin color, texture, turgor normal. No rashes or lesions Lymph nodes: Cervical, supraclavicular, and axillary nodes normal.  Lab Results  Component Value Date   HGBA1C 6.4 09/10/2020   HGBA1C 6.2 08/17/2019   HGBA1C 6.3 08/01/2018    Lab Results  Component Value Date   CREATININE 0.74 09/10/2020   CREATININE 0.87 03/10/2020   CREATININE 0.75 08/17/2019    Lab Results  Component Value Date   WBC 6.5 09/10/2020   HGB 11.8 (L) 09/10/2020   HCT 35.5 (L) 09/10/2020   PLT 192.0 09/10/2020   GLUCOSE 92 09/10/2020   CHOL 215 (H) 09/10/2020   TRIG 103.0 09/10/2020   HDL 69.40 09/10/2020   LDLDIRECT 137.0 01/06/2015   LDLCALC 125 (H) 09/10/2020   ALT 24 09/10/2020   AST 26 09/10/2020   NA 138 09/10/2020   K 4.2 09/10/2020   CL 102 09/10/2020   CREATININE 0.74 09/10/2020   BUN 18 09/10/2020   CO2 28 09/10/2020   TSH 1.18 10/19/2017   HGBA1C 6.4 09/10/2020   MICROALBUR 1.9 09/10/2020    No results found.  Assessment & Plan:   Problem List Items Addressed This Visit       Unprioritized   Hypertension - Primary    Well controlled on current regimen of amlidipine and losartan. . Renal function has been  stable, no changes today.       Relevant Orders   Comprehensive metabolic panel (Completed)   Microalbumin / creatinine urine ratio (Completed)   Hyperlipidemia     Goal is LDL <100 given AA  Noted on prior imaging.  Currently taking pravastatin 20 mg due to intolerance of high potency statin        Relevant Orders   Lipid panel (Completed)   Fecal soiling    Strongly urged her to have GI evaluation given that a 5 yr follow up colonoscopy was deferred 2 years ago [facility]).  Referral to female  GI in process .        Relevant Orders   Ambulatory referral to Gastroenterology   Prediabetes    Secondary to family history of type 2 DM  . Patient is by no means overweight or sedentary.  She had  reduced her a1c from 6.5 to 5.4  last year on a a low GI diet ; repeat a1c was higher again last July .  Repeat is due   Lab Results  Component Value Date   HGBA1C 6.2 08/17/2019         Relevant Orders   Comprehensive metabolic panel (Completed)   Microalbumin / creatinine urine ratio (Completed)   Hemoglobin A1c (Completed)   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([facility])    Her tumor has started to regrow and she has undregone  5 sessions of  XRT I n lieu of surgery sh has had improvement in the headaches  That began recurring,  But continues to have left facial nerve paralysis that is intermittent       Generalized anxiety disorder    Improved with use of zoloft 100 mg daily .  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  Facial weakness is intermittent and has improved transiently with prednisone .  Follow up with ENT at [facility]        Other Visit Diagnoses     Fatigue, unspecified type       Relevant Orders   CBC with Differential/Platelet (Completed)   Hx of colonic polyp       Relevant Orders   Ambulatory referral to Gastroenterology       I have discontinued Meyer Russel. Donnan's traMADol and rosuvastatin. I am also having her maintain her Calcium 1200, Fish Oil, Turmeric, denosumab, Vitamin D-3, erythromycin, ASPIRIN 81 PO, Biotin w/ Vitamins C & E (HAIR/SKIN/NAILS PO), losartan, amLODipine, and sertraline.  No  orders of the defined types were placed in this encounter.   Medications Discontinued During This Encounter  Medication Reason   rosuvastatin (CRESTOR) 20 MG tablet    traMADol (ULTRAM) 50 MG tablet     Follow-up: No follow-ups on file.   Crecencio Mc, MD

## 2020-09-10 NOTE — Assessment & Plan Note (Signed)
Secondary to family history of type 2 DM  . Patient is by no means overweight or sedentary.  She had  reduced her a1c from 6.5 to 5.4 last year on a a low GI diet ; repeat a1c was higher again last July .  Repeat is due   Lab Results  Component Value Date   HGBA1C 6.2 08/17/2019

## 2020-09-10 NOTE — Assessment & Plan Note (Signed)
Goal is LDL <100 given AA  Noted on prior imaging.  Currently taking pravastatin 20 mg due to intolerance of high potency statin

## 2020-09-10 NOTE — Patient Instructions (Addendum)
We may need to increase the pravastatin to 40 mg if your LDL is > 100 today  Let me know if the current tablet can be cut in half easily  in case we need to reduce a 40 mg tablet back to 200  YOU MAY HAVE A GLASS OF WINE DAILY !  MODERATION IN ALL THINGS IS MY MOTTO,  AND ONE GLASS IS "MODERATE"  Ok to use Imodium once daily if needed.   Gi referral in progress

## 2020-09-10 NOTE — Assessment & Plan Note (Addendum)
Her tumor has started to regrow and she has undregone  5 sessions of  XRT I n lieu of surgery sh has had improvement in the headaches  That began recurring,  But continues to have left facial nerve paralysis that is intermittent

## 2020-09-10 NOTE — Assessment & Plan Note (Signed)
Well controlled on current regimen of amlidipine and losartan. . Renal function has been  stable, no changes today.

## 2020-09-10 NOTE — Assessment & Plan Note (Addendum)
Strongly urged her to have GI evaluation given that a 5 yr follow up colonoscopy was deferred 2 years ago [facility]).  Referral to female  GI in process .

## 2020-09-11 ENCOUNTER — Telehealth: Payer: Self-pay | Admitting: Internal Medicine

## 2020-09-11 ENCOUNTER — Other Ambulatory Visit: Payer: Self-pay | Admitting: Internal Medicine

## 2020-09-11 DIAGNOSIS — G510 Bell's palsy: Secondary | ICD-10-CM | POA: Insufficient documentation

## 2020-09-11 DIAGNOSIS — E7800 Pure hypercholesterolemia, unspecified: Secondary | ICD-10-CM

## 2020-09-11 MED ORDER — PRAVASTATIN SODIUM 20 MG PO TABS: 20.0000 mg | ORAL_TABLET | Freq: Every day | ORAL | 1 refills | Status: DC

## 2020-09-11 MED ORDER — PRAVASTATIN SODIUM 40 MG PO TABS: 40.0000 mg | ORAL_TABLET | Freq: Every day | ORAL | 1 refills | Status: DC

## 2020-09-11 NOTE — Assessment & Plan Note (Signed)
Secondary to schwannoma.  Facial weakness is intermittent and has improved transiently with prednisone .  Follow up with ENT at [facility]

## 2020-09-11 NOTE — Telephone Encounter (Signed)
Patient is requesting a refill on her  pravastatin (PRAVACHOL) 20 MG tablet.

## 2020-09-11 NOTE — Assessment & Plan Note (Signed)
Improved with use of zoloft 100 mg daily .  No changes today

## 2020-09-11 NOTE — Telephone Encounter (Signed)
Medication has been refilled.

## 2020-09-11 NOTE — Assessment & Plan Note (Signed)
Increasing pravastatin to 40 mg daily

## 2020-10-08 ENCOUNTER — Ambulatory Visit: Payer: Medicare Other

## 2020-10-13 ENCOUNTER — Other Ambulatory Visit: Payer: Self-pay

## 2020-10-13 ENCOUNTER — Ambulatory Visit (INDEPENDENT_AMBULATORY_CARE_PROVIDER_SITE_OTHER): Payer: Medicare Other

## 2020-10-13 DIAGNOSIS — M810 Age-related osteoporosis without current pathological fracture: Secondary | ICD-10-CM

## 2020-10-13 MED ORDER — DENOSUMAB 60 MG/ML ~~LOC~~ SOSY
60.0000 mg | PREFILLED_SYRINGE | Freq: Once | SUBCUTANEOUS | Status: AC
  Administered 2020-10-13: 60 mg via SUBCUTANEOUS

## 2020-10-13 NOTE — Progress Notes (Signed)
Patient presented today for a prolia injection to left deltoid. Pt voiced no discomfort or complaints at time of injection.

## 2020-11-11 ENCOUNTER — Other Ambulatory Visit: Payer: Self-pay | Admitting: Internal Medicine

## 2020-11-12 DIAGNOSIS — H168 Other keratitis: Secondary | ICD-10-CM | POA: Diagnosis not present

## 2020-11-17 ENCOUNTER — Other Ambulatory Visit: Payer: Self-pay

## 2020-11-18 ENCOUNTER — Encounter: Payer: Self-pay | Admitting: Gastroenterology

## 2020-11-18 ENCOUNTER — Other Ambulatory Visit: Payer: Self-pay

## 2020-11-18 ENCOUNTER — Ambulatory Visit (INDEPENDENT_AMBULATORY_CARE_PROVIDER_SITE_OTHER): Payer: Medicare Other | Admitting: Gastroenterology

## 2020-11-18 VITALS — BP 165/60 | HR 82 | Temp 97.9°F | Ht 62.0 in | Wt 115.6 lb

## 2020-11-18 DIAGNOSIS — R151 Fecal smearing: Secondary | ICD-10-CM | POA: Diagnosis not present

## 2020-11-18 NOTE — Progress Notes (Signed)
Cephas Darby, MD [address]  [place]  [place], [place] [address]  Main: [phone]  Fax: [phone]    Gastroenterology Consultation  Referring Provider:     Crecencio Mc, MD Primary Care Physician:  Crecencio Mc, MD Primary Gastroenterologist:  Dr. Cephas Darby Reason for Consultation:     Fecal soiling        HPI:   Rita Taylor is a 80 y.o. female referred by Dr. Crecencio Mc, MD  for consultation & management of fecal soiling.  Patient reports that in January 2022, when she was using the restroom for urination, she noticed that her undergarments were soiled with feces.  This was happening on a regular basis.  She started taking Imodium about once a week.  When she saw Dr. Derrel Nip, she advised patient that she could take Imodium daily.  Patient started taking Imodium once a day and fecal soiling resolved.  She reports that she has been having formed stool, describes on Bristol stool scale as 4.  She has not had any soiling since July 2022.  She continues to take Imodium.  She underwent stool studies which were negative for infection.  She denies any abdominal pain, bloating, nausea or vomiting, rectal bleeding, weight loss.  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[facility])    Postoperative anemia 07/22/2018   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[facility])     Past Surgical History:  Procedure Laterality Date   bitubal ligation  1981   BREAST ENHANCEMENT SURGERY  1990   BROW LIFT Left 01/24/2020   Procedure: TARSORRHAPHY, LATERAL PLACEMENT  LEFT LOWER LID;  Surgeon: Karle Starch, MD;  Location: [facility];  Service: Ophthalmology;  Laterality: Left;   COLONOSCOPY     ECTROPION REPAIR Left 03/02/2019   Procedure: ECTROPION REPAIR, EXTENSIVE AND TARSORRHAPHY, LATERAL PLACEMENT OF LEFT LOWER LID;  Surgeon: Karle Starch, MD;  Location: [place];  Service: Ophthalmology;  Laterality: Left;   FACIAL COSMETIC SURGERY     GYNECOLOGIC CRYOSURGERY     15 years ago, normal since then, was treated with antibiotics, Annual pap smears for the past 20 years   TUMOR EXCISION Left 06/2018   ear    Current Outpatient Medications:    amLODipine (NORVASC) 5 MG tablet, TAKE 1 TABLET BY MOUTH DAILY, Disp: 90 tablet, Rfl: 1   ASPIRIN 81 PO, Take by mouth daily., Disp: , Rfl:    Biotin w/ Vitamins C & E (HAIR/SKIN/NAILS PO), Take by mouth daily., Disp: , Rfl:    Calcium Carbonate-Vit D-Min (CALCIUM 1200) 1200-1000 MG-UNIT CHEW, Chew 1 tablet by mouth daily., Disp: , Rfl:    Cholecalciferol (VITAMIN D-3) 25 MCG (1000 UT) CAPS, Take by mouth., Disp: , Rfl:    denosumab (PROLIA) 60 MG/ML SOSY injection, Inject into the skin., Disp: , Rfl:    erythromycin ophthalmic ointment, Apply to sutures 4 times a day for 10-12 days.  Discontinue if allergy develops and call our office, Disp: 3.5 g, Rfl: 2   losartan (COZAAR) 100 MG tablet,  TAKE 1 TABLET(100 MG) BY MOUTH DAILY, Disp: 90 tablet, Rfl: 1   pravastatin (PRAVACHOL) 40 MG tablet, Take 1 tablet (40 mg total) by mouth daily., Disp: 90 tablet, Rfl: 1   sertraline (ZOLOFT) 100 MG tablet, TAKE 1 TABLET(100 MG) BY MOUTH DAILY, Disp: 30 tablet, Rfl: 2   Turmeric 500 MG CAPS, Take 1 capsule by mouth 2 (two) times daily., Disp: , Rfl:    dexamethasone (DECADRON) 1 MG tablet, Take by mouth. (Patient not taking: Reported on 11/18/2020), Disp: , Rfl:    Omega-3 Fatty Acids (FISH OIL) 1000 MG CAPS, Take 1 capsule by mouth 2 (two) times daily. (Patient not taking: Reported on 11/18/2020), Disp: , Rfl:      Family History  Problem Relation Age of Onset   Hypertension Mother    Hypertension Father    Hypertension Brother      Social History   Tobacco Use   Smoking status: Never   Smokeless tobacco: Never  Vaping Use   Vaping Use: Never used  Substance Use Topics   Alcohol use: Yes    Comment: moderate half a glass daily   Drug use: No    Allergies as of 11/18/2020 - Review Complete 11/18/2020  Allergen Reaction Noted   Fosamax [alendronate]  07/03/2012   [id number]     Risedronate sodium  10/31/2010    Review of Systems:    All systems reviewed and negative except where noted in HPI.   Physical Exam:  BP (!) 165/60 (BP Location: Right Arm, Patient Position: Sitting, Cuff Size: Normal)   Pulse 82   Temp 97.9 F (36.6 C) (Oral)   Ht 5\' 2"  (1.575 m)   Wt 115 lb 9.6 oz (52.4 kg)   BMI 21.14 kg/m  No LMP recorded. Patient is postmenopausal.  General:   Alert,  Well-developed, well-nourished, pleasant and cooperative in NAD Head:  Normocephalic and atraumatic. Eyes:  Sclera clear, no icterus.   Conjunctiva pink. Ears:  Normal auditory acuity. Nose:  No deformity, discharge, or lesions. Mouth:  No deformity or lesions,oropharynx pink & moist. Neck:  Supple; no masses or thyromegaly. Lungs:  Respirations even and unlabored.  Clear throughout to auscultation.   No wheezes, crackles, or rhonchi. No acute distress. Heart:  Regular rate and rhythm; no murmurs, clicks, rubs, or gallops. Abdomen:  Normal bowel sounds. Soft, non-tender and non-distended without masses, hepatosplenomegaly or hernias noted.  No guarding or rebound tenderness.   Rectal: Normal perianal skin, nontender digital rectal exam, decreased sphincter tone, brown stool in rectal vault Msk:  Symmetrical without gross deformities. Good, equal movement & strength bilaterally. Pulses:  Normal pulses noted. Extremities:  No clubbing or edema.  No cyanosis. Neurologic:  Alert and oriented x3;   grossly normal neurologically. Skin:  Intact without significant lesions or rashes. No jaundice. Psych:  Alert and cooperative. Normal mood and affect.  Imaging Studies: Reviewed  Assessment and Plan:   Rita Taylor is a 80 y.o. pleasant Caucasian female with history of type II neurofibromatosis is seen in consultation for chronic history of fecal soiling which has now resolved upon taking Imodium 1 pill daily.  Patient reports having formed bowel movement once a day.  She denies any other GI symptoms, she does not have any other alarm signs or symptoms.  She can continue Imodium once a day or as needed for now.  If her symptoms recur, she will contact my office.  Given her history of adenomatous polyp, I did recommend surveillance colonoscopy  and patient does not want to undergo colonoscopy   Follow up as needed   Cephas Darby, MD

## 2020-11-26 DIAGNOSIS — Z6820 Body mass index (BMI) 20.0-20.9, adult: Secondary | ICD-10-CM | POA: Diagnosis not present

## 2020-11-26 DIAGNOSIS — D333 Benign neoplasm of cranial nerves: Secondary | ICD-10-CM | POA: Diagnosis not present

## 2020-11-26 DIAGNOSIS — E7800 Pure hypercholesterolemia, unspecified: Secondary | ICD-10-CM | POA: Diagnosis not present

## 2020-11-26 DIAGNOSIS — R519 Headache, unspecified: Secondary | ICD-10-CM | POA: Diagnosis not present

## 2020-11-26 DIAGNOSIS — R29810 Facial weakness: Secondary | ICD-10-CM | POA: Diagnosis not present

## 2020-11-26 DIAGNOSIS — I10 Essential (primary) hypertension: Secondary | ICD-10-CM | POA: Diagnosis not present

## 2020-11-26 DIAGNOSIS — E236 Other disorders of pituitary gland: Secondary | ICD-10-CM | POA: Diagnosis not present

## 2020-11-27 DIAGNOSIS — Z23 Encounter for immunization: Secondary | ICD-10-CM | POA: Diagnosis not present

## 2020-12-03 IMAGING — US ULTRASOUND ABDOMEN LIMITED
1 series · 14 of 23 positions shown · non-contrast
Comparison: None.

CLINICAL DATA: Possible periumbilical hernia

EXAM:
ULTRASOUND ABDOMEN LIMITED

[Series 1: ultrasound abdomen limited · 0.06mm/px · 23 acquisitions, 14 frames shown]
[im 1/23]
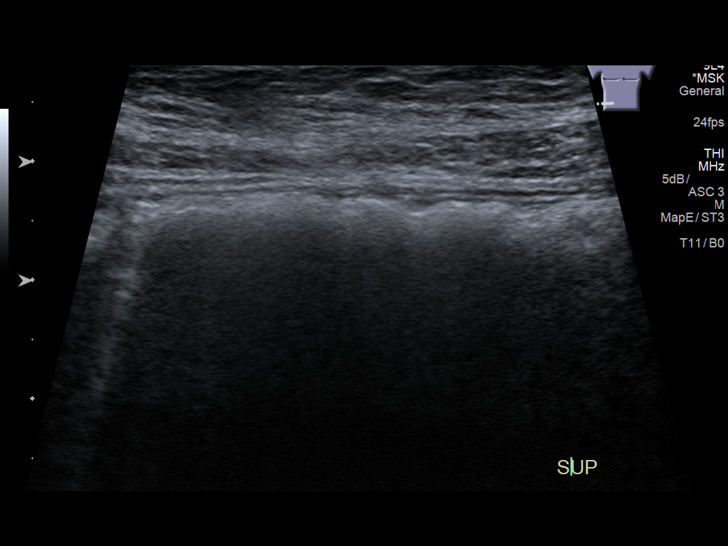
[im 3/23]
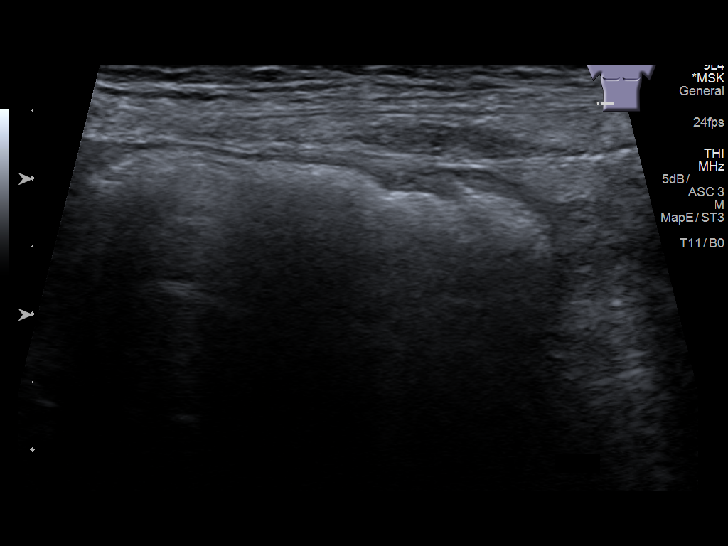
[im 5/23]
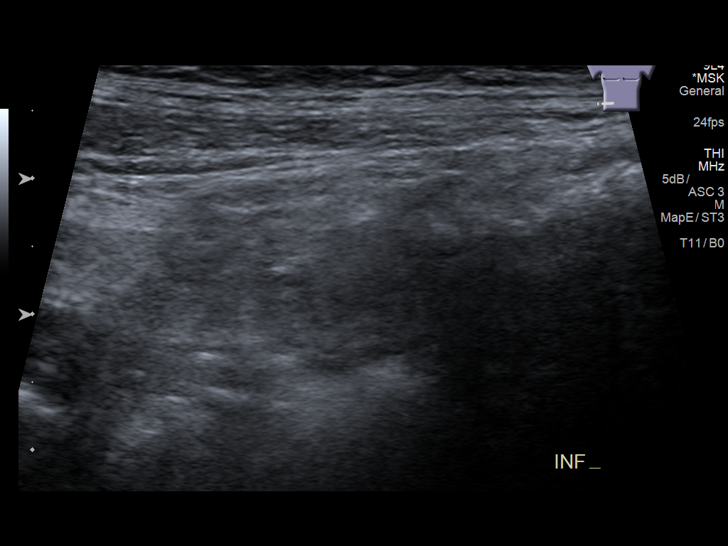
[im 6/23]
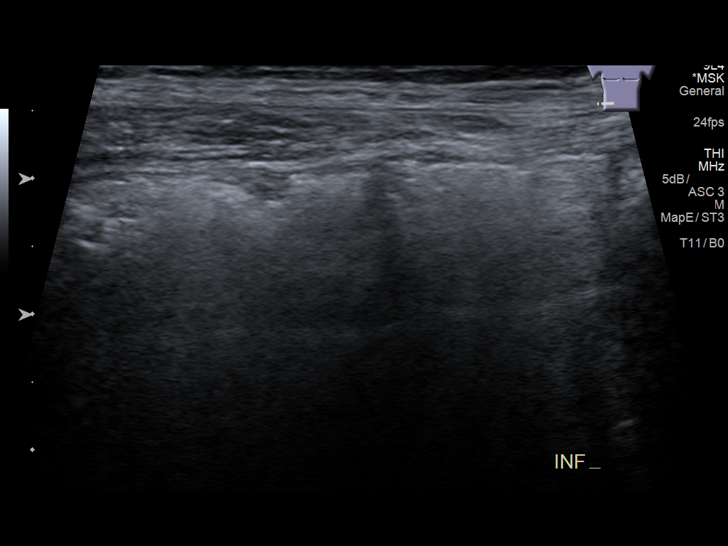
[im 8/23]
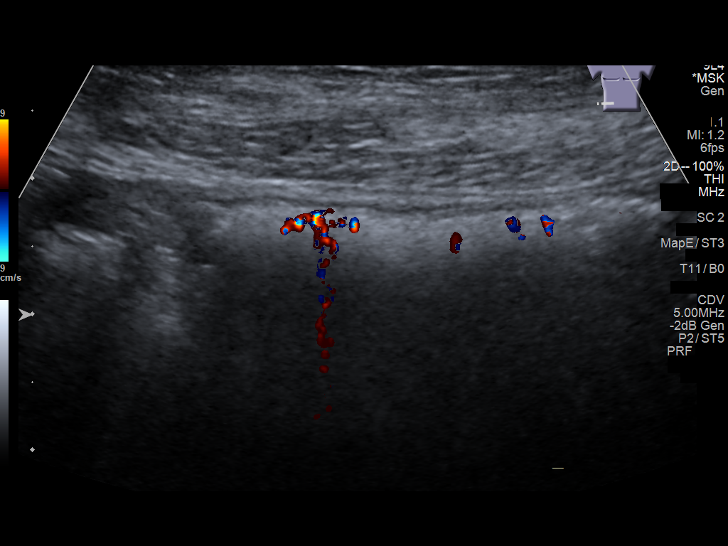
[im 10/23]
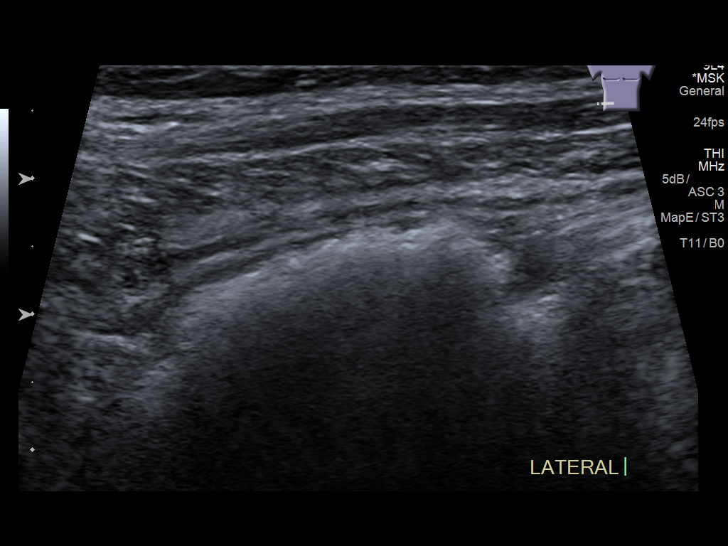
[im 11/23]
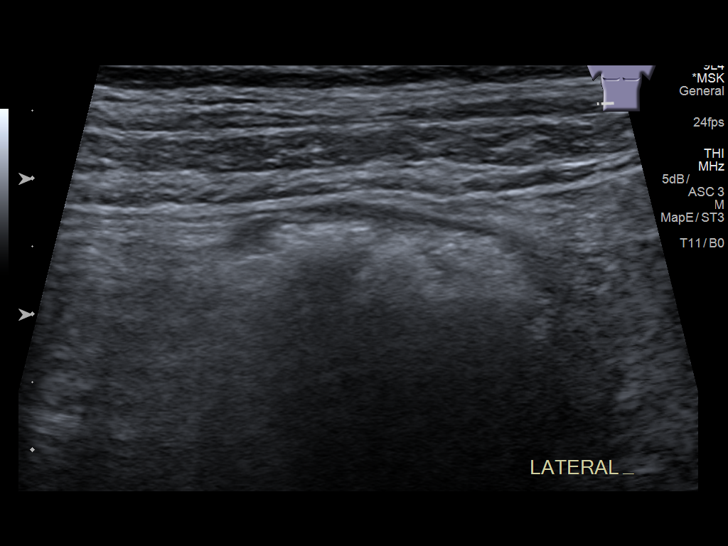
[im 13/23]
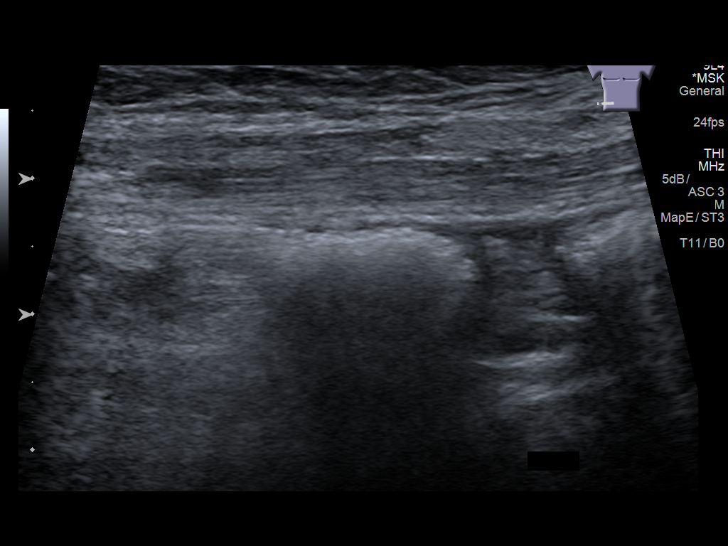
[im 14/23]
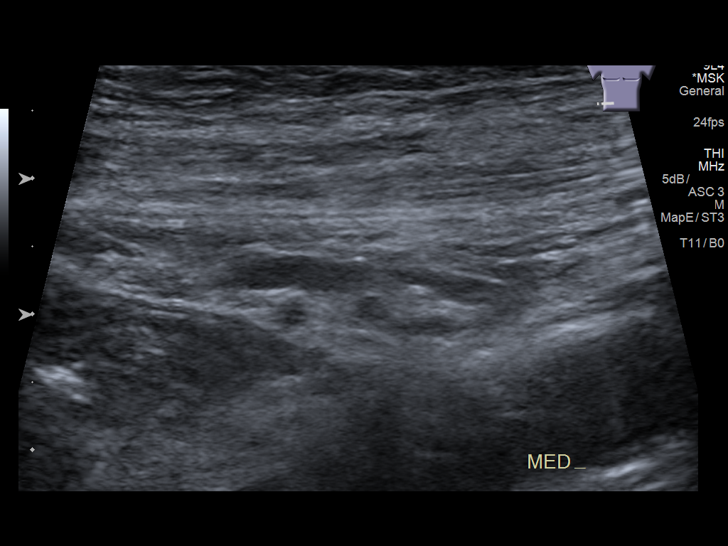
[im 16/23]
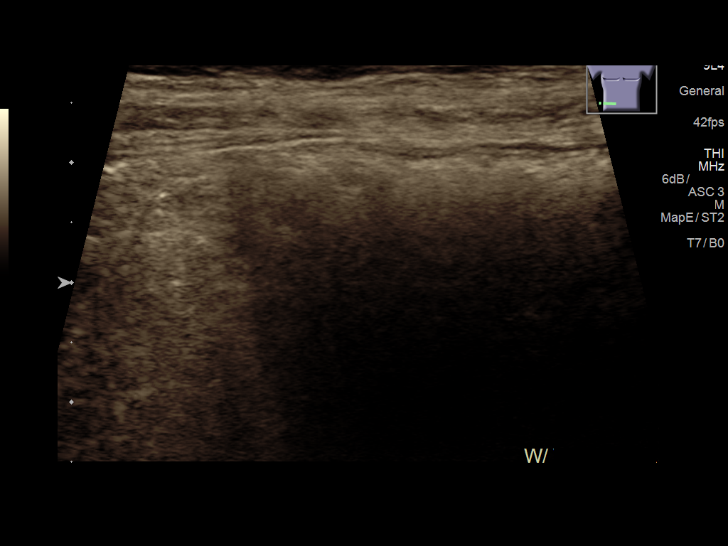
[im 18/23]
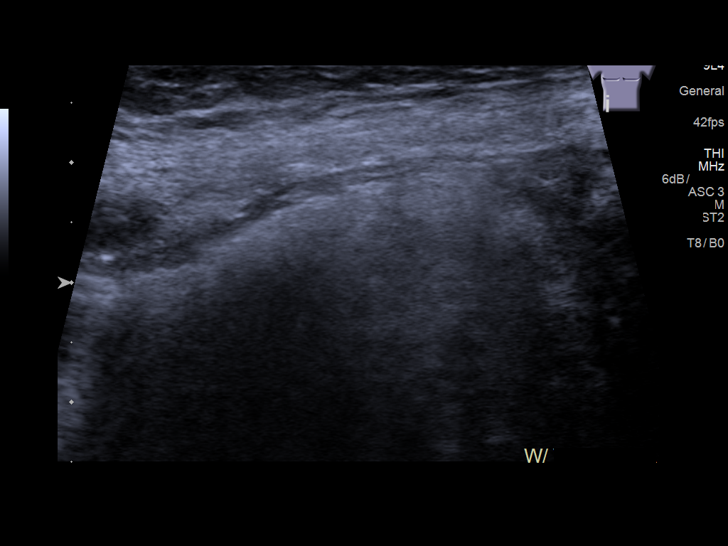
[im 19/23]
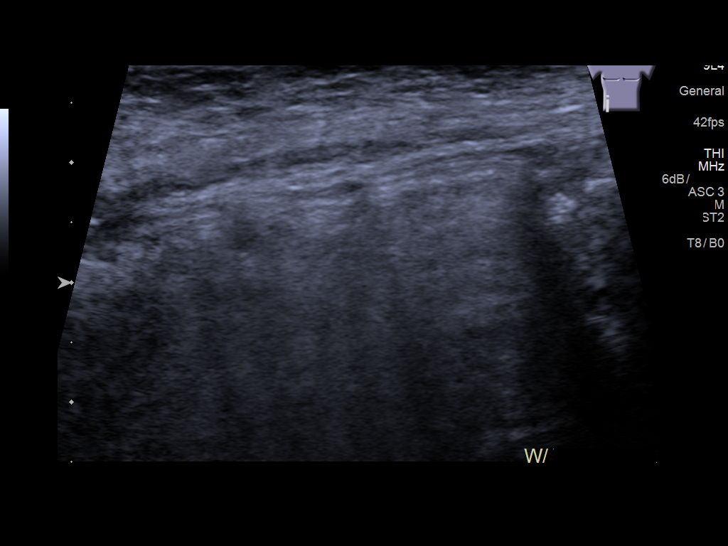
[im 21/23]
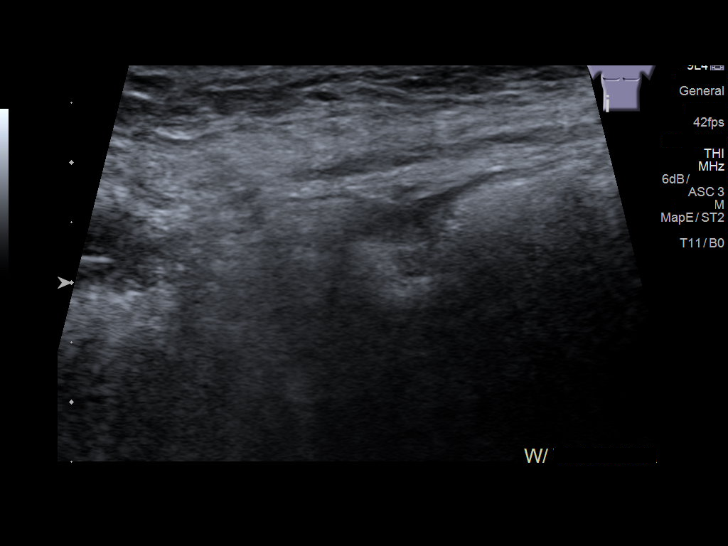
[im 23/23]
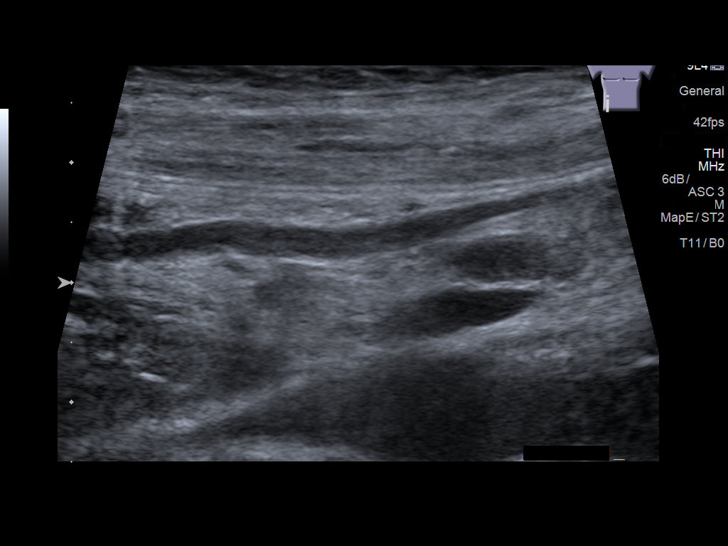

[14 of 23 positions shown; findings below may reference images not displayed]

FINDINGS: No findings to suggest umbilical or periumbilical hernia are noted.
IMPRESSION: No evidence of hernia identified.

## 2020-12-06 ENCOUNTER — Other Ambulatory Visit: Payer: Self-pay | Admitting: Internal Medicine

## 2021-01-19 ENCOUNTER — Telehealth: Payer: Self-pay | Admitting: Internal Medicine

## 2021-01-19 MED ORDER — LOSARTAN POTASSIUM 100 MG PO TABS: 100.0000 mg | ORAL_TABLET | Freq: Every day | ORAL | 1 refills | Status: DC

## 2021-01-19 NOTE — Telephone Encounter (Signed)
Patient is requesting a refill on her losartan (COZAAR) 100 MG tablet. 

## 2021-01-19 NOTE — Telephone Encounter (Signed)
Medication has been refilled.

## 2021-02-06 ENCOUNTER — Telehealth: Payer: Self-pay | Admitting: Internal Medicine

## 2021-02-06 NOTE — Telephone Encounter (Signed)
Spoke with pt and informed her of the directions for weaning off the Sertraline. Pt gave a verbal understanding.

## 2021-02-06 NOTE — Telephone Encounter (Signed)
She should wean off of it as follows;  1/2 tablet daily for one week  1/2 tablet every other day for one week  stop

## 2021-02-06 NOTE — Telephone Encounter (Signed)
Spoke with pt and informed her that Dr. Derrel Nip is out of the office until next Wednesday. Pt gave a verbal understanding and stated that she would not stop the medication until she heard back from our office.

## 2021-02-06 NOTE — Telephone Encounter (Signed)
Pt called in stating that medication (sertraline (ZOLOFT) 100 MG tablet) is not working for her. Pt was wondering if she can stop the medication. Pt requesting callback

## 2021-02-07 ENCOUNTER — Other Ambulatory Visit: Payer: Self-pay | Admitting: Internal Medicine

## 2021-02-07 DIAGNOSIS — I10 Essential (primary) hypertension: Secondary | ICD-10-CM

## 2021-02-11 ENCOUNTER — Ambulatory Visit (INDEPENDENT_AMBULATORY_CARE_PROVIDER_SITE_OTHER): Payer: Medicare Other | Admitting: Family Medicine

## 2021-02-11 ENCOUNTER — Encounter: Payer: Self-pay | Admitting: Family Medicine

## 2021-02-11 ENCOUNTER — Other Ambulatory Visit: Payer: Self-pay

## 2021-02-11 VITALS — BP 150/70 | HR 90 | Temp 98.8°F | Ht 62.0 in | Wt 110.8 lb

## 2021-02-11 DIAGNOSIS — I2510 Atherosclerotic heart disease of native coronary artery without angina pectoris: Secondary | ICD-10-CM

## 2021-02-11 DIAGNOSIS — R112 Nausea with vomiting, unspecified: Secondary | ICD-10-CM | POA: Insufficient documentation

## 2021-02-11 MED ORDER — SERTRALINE HCL 50 MG PO TABS: ORAL_TABLET | ORAL | 0 refills | Status: DC

## 2021-02-11 NOTE — Patient Instructions (Signed)
Nice to see you. We will check some lab work today to ensure that your sodium level and your kidney function is acceptable.  We will let you know what the results reveal. We are going to taper your sertraline a little more slowly.  I think this taper is the result of some of your symptoms. You need to see the radiation oncologist as planned. If your headaches worsen or if you develop persistent vomiting you need to be reevaluated.

## 2021-02-11 NOTE — Progress Notes (Signed)
Tommi Rumps, MD Phone: [phone]  Rita Taylor is a 80 y.o. female who presents today for same-day visit.  Nausea/headache: Patient notes 3 days ago she developed nausea and lack of appetite.  She had a headache for several days.  She notes vomiting x7 with small amounts that were nonbloody nonbilious.  No diarrhea.  No constipation.  No cough, congestion, fevers, or other upper respiratory symptoms.  She notes her sertraline was tapered down to 50 mg once daily recently with a goal of tapering off.  She notes it only been a few days into the taper when her symptoms started.  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  Her radiation oncology provider advised Tylenol 1000 mg 3 times daily for her chronic issue of headaches.  She does see radiation oncology on 02/22/2020 and has an MRI scheduled at that time.  She has been taking Tylenol 1000 mg every 6 hours for her headaches.  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  Social History   Tobacco Use  Smoking Status Never  Smokeless Tobacco Never    Current Outpatient Medications on File Prior to Visit  Medication Sig Dispense Refill   amLODipine (NORVASC) 5 MG tablet TAKE 1 TABLET BY MOUTH DAILY 90 tablet 1   ASPIRIN 81 PO Take by mouth daily.     Biotin w/ Vitamins C & E (HAIR/SKIN/NAILS PO) Take by mouth daily.     Calcium Carbonate-Vit D-Min (CALCIUM 1200) 1200-1000 MG-UNIT CHEW Chew 1 tablet by mouth daily.     Cholecalciferol (VITAMIN D-3) 25 MCG (1000 UT) CAPS Take by mouth.     denosumab (PROLIA) 60 MG/ML SOSY injection Inject into the skin.     erythromycin ophthalmic ointment Apply to sutures 4 times a day for 10-12 days.  Discontinue if allergy develops and call our office 3.5 g 2   losartan (COZAAR) 100 MG tablet Take 1 tablet (100 mg total) by mouth daily. 90  tablet 1   pravastatin (PRAVACHOL) 40 MG tablet Take 1 tablet (40 mg total) by mouth daily. 90 tablet 1   Turmeric 500 MG CAPS Take 1 capsule by mouth 2 (two) times daily.     No current facility-administered medications on file prior to visit.     ROS see history of present illness  Objective  Physical Exam Vitals:   02/11/21 1315  BP: (!) 150/70  Pulse: 90  Temp: 98.8 F (37.1 C)  SpO2: 99%    BP Readings from Last 3 Encounters:  02/11/21 (!) 150/70  11/18/20 (!) 165/60  09/10/20 130/72   Wt Readings from Last 3 Encounters:  02/11/21 110 lb 12.8 oz (50.3 kg)  11/18/20 115 lb 9.6 oz (52.4 kg)  09/10/20 110 lb 6.4 oz (50.1 kg)    Physical Exam Constitutional:      General: She is not in acute distress.    Appearance: She is not diaphoretic.  Cardiovascular:     Rate and Rhythm: Normal rate and regular rhythm.     Heart sounds: Normal heart sounds.  Pulmonary:     Effort: Pulmonary effort is normal.     Breath sounds: Normal breath sounds.  Skin:    General: Skin is warm and dry.  Neurological:     Mental Status: She is alert.     Comments: Left eye is taped over, light  touch sensation is reduced in left V1 through V3, hearing is absent in the left ear, right eye with good extraocular movements, right pupil is reactive to light, hearing intact on the right side to finger rub, right-sided V1 through V3 intact light touch sensation, shoulder shrug intact bilaterally, 5/5 strength in bilateral biceps, triceps, grip, quads, hamstrings, plantar and dorsiflexion, sensation to light touch intact in bilateral UE and LE     Assessment/Plan: Please see individual problem list.  Problem List Items Addressed This Visit     Nausea and vomiting - Primary    The patient has had some nausea and headaches for a few days associated with very minimal vomiting.  Discussed that tapering off of sertraline could potentially cause this kind of issue.  Discussed that possible growth of  her schwannoma could be contributing to this as well.  We will alter her tapering regimen and have her take sertraline 75 mg once daily for 14 days followed by 50 mg once daily for 14 days followed by 25 mg once daily for 14 days and then taper off.  She can continue her Tylenol 1000 mg every 6 hours for a short period of time and then needs to revert back to 1000 mg every 8 hours.  She will keep her appointment with radiation oncology for her MRI and evaluation with them.  If she has any worsening symptoms or if her symptoms do not improve with altering her tapering regimen she will let us know.  We will also check a BMP to evaluate kidney function and check her sodium level.      Relevant Orders   Basic Metabolic Panel (BMET)     Return if symptoms worsen or fail to improve.  This visit occurred during the SARS-CoV-2 public health emergency.  Safety protocols were in place, including screening questions prior to the visit, additional usage of staff PPE, and extensive cleaning of exam room while observing appropriate contact time as indicated for disinfecting solutions.   I have spent 33 minutes in the care of this patient regarding history taking, documentation, completion of exam, discussion of plan, counseling on tapering regimen for his sertraline, review of most recent phone note with radiation oncology.   Tommi Rumps, MD [place]

## 2021-02-11 NOTE — Assessment & Plan Note (Signed)
The patient has had some nausea and headaches for a few days associated with very minimal vomiting.  Discussed that tapering off of sertraline could potentially cause this kind of issue.  Discussed that possible growth of her schwannoma could be contributing to this as well.  We will alter her tapering regimen and have her take sertraline 75 mg once daily for 14 days followed by 50 mg once daily for 14 days followed by 25 mg once daily for 14 days and then taper off.  She can continue her Tylenol 1000 mg every 6 hours for a short period of time and then needs to revert back to 1000 mg every 8 hours.  She will keep her appointment with radiation oncology for her MRI and evaluation with them.  If she has any worsening symptoms or if her symptoms do not improve with altering her tapering regimen she will let us know.  We will also check a BMP to evaluate kidney function and check her sodium level.

## 2021-02-12 LAB — BASIC METABOLIC PANEL
BUN: 17 mg/dL (ref 6–23)
CO2: 27 mEq/L (ref 19–32)
Calcium: 9.1 mg/dL (ref 8.4–10.5)
Chloride: 96 mEq/L (ref 96–112)
Creatinine, Ser: 0.77 mg/dL (ref 0.40–1.20)
GFR: 72.43 mL/min (ref >60.00)
Glucose, Bld: 128 mg/dL — ABNORMAL HIGH (ref 70–99)
Potassium: 3.8 mEq/L (ref 3.5–5.1)
Sodium: 132 mEq/L — ABNORMAL LOW (ref 135–145)

## 2021-02-18 DIAGNOSIS — I10 Essential (primary) hypertension: Secondary | ICD-10-CM | POA: Diagnosis not present

## 2021-02-18 DIAGNOSIS — R519 Headache, unspecified: Secondary | ICD-10-CM | POA: Diagnosis not present

## 2021-02-18 DIAGNOSIS — Q8502 Neurofibromatosis, type 2: Secondary | ICD-10-CM | POA: Diagnosis not present

## 2021-02-18 DIAGNOSIS — R29810 Facial weakness: Secondary | ICD-10-CM | POA: Diagnosis not present

## 2021-02-18 DIAGNOSIS — D333 Benign neoplasm of cranial nerves: Secondary | ICD-10-CM | POA: Diagnosis not present

## 2021-02-18 DIAGNOSIS — E7800 Pure hypercholesterolemia, unspecified: Secondary | ICD-10-CM | POA: Diagnosis not present

## 2021-02-18 DIAGNOSIS — Z9889 Other specified postprocedural states: Secondary | ICD-10-CM | POA: Diagnosis not present

## 2021-03-09 ENCOUNTER — Other Ambulatory Visit: Payer: Self-pay | Admitting: Internal Medicine

## 2021-03-10 ENCOUNTER — Other Ambulatory Visit: Payer: Self-pay | Admitting: Internal Medicine

## 2021-03-13 ENCOUNTER — Encounter: Payer: Self-pay | Admitting: Internal Medicine

## 2021-03-13 ENCOUNTER — Ambulatory Visit (INDEPENDENT_AMBULATORY_CARE_PROVIDER_SITE_OTHER): Payer: Medicare Other | Admitting: Internal Medicine

## 2021-03-13 ENCOUNTER — Other Ambulatory Visit: Payer: Self-pay

## 2021-03-13 VITALS — BP 154/76 | HR 74 | Temp 97.8°F | Ht 62.5 in | Wt 113.8 lb

## 2021-03-13 DIAGNOSIS — I700 Atherosclerosis of aorta: Secondary | ICD-10-CM | POA: Diagnosis not present

## 2021-03-13 DIAGNOSIS — Q8502 Neurofibromatosis, type 2: Secondary | ICD-10-CM

## 2021-03-13 DIAGNOSIS — F411 Generalized anxiety disorder: Secondary | ICD-10-CM | POA: Diagnosis not present

## 2021-03-13 DIAGNOSIS — E7800 Pure hypercholesterolemia, unspecified: Secondary | ICD-10-CM | POA: Diagnosis not present

## 2021-03-13 DIAGNOSIS — I10 Essential (primary) hypertension: Secondary | ICD-10-CM

## 2021-03-13 DIAGNOSIS — G510 Bell's palsy: Secondary | ICD-10-CM | POA: Diagnosis not present

## 2021-03-13 LAB — COMPREHENSIVE METABOLIC PANEL
ALT: 12 U/L (ref 0–35)
AST: 14 U/L (ref 0–37)
Albumin: 4.3 g/dL (ref 3.5–5.2)
Alkaline Phosphatase: 42 U/L (ref 39–117)
BUN: 28 mg/dL — ABNORMAL HIGH (ref 6–23)
CO2: 28 mEq/L (ref 19–32)
Calcium: 9.2 mg/dL (ref 8.4–10.5)
Chloride: 103 mEq/L (ref 96–112)
Creatinine, Ser: 0.72 mg/dL (ref 0.40–1.20)
GFR: 78.46 mL/min (ref >60.00)
Glucose, Bld: 100 mg/dL — ABNORMAL HIGH (ref 70–99)
Potassium: 4 mEq/L (ref 3.5–5.1)
Sodium: 139 mEq/L (ref 135–145)
Total Bilirubin: 0.6 mg/dL (ref 0.2–1.2)
Total Protein: 6.7 g/dL (ref 6.0–8.3)

## 2021-03-13 LAB — LIPID PANEL
Cholesterol: 174 mg/dL (ref 0–200)
HDL: 77 mg/dL (ref >39.00)
LDL Cholesterol: 80 mg/dL (ref 0–99)
NonHDL: 97.28
Total CHOL/HDL Ratio: 2
Triglycerides: 87 mg/dL (ref 0.0–149.0)
VLDL: 17.4 mg/dL (ref 0.0–40.0)

## 2021-03-13 NOTE — Progress Notes (Signed)
Subjective:  Patient ID: Rita Taylor, female    DOB: [birth year]  Age: 81 y.o. MRN: [id number]  CC: The primary encounter diagnosis was Primary hypertension. 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   This visit occurred during the SARS-CoV-2 public health emergency.  Safety protocols were in place, including screening questions prior to the visit, additional usage of staff PPE, and extensive cleaning of exam room while observing appropriate contact time as indicated for disinfecting solutions.    HPI Rita Taylor presents for  Chief Complaint  Patient presents with   Follow-up    6 month follow up on hypertension   1) HTN:  Patient is taking her medications as prescribed and notes no adverse effects.  Home BP readings have been done about once per week and are  generally < 130/80 .  She is avoiding added salt in her diet and walking regularly about 3 times per week for exercise .  She has white coat hypertension, proven with a 24 hr ambulator BP monitor done by cardiology .    2) Vestibular schwannoma, bilateral. She continues to have significant left eye ptosis and sgnificant left facial droop.   Despite an increase in tumor size, her neuroloist told her that the tumor is "dying"  Headaches resolved with new medication, but having recurrent episodes of loss of balance.     3) Underweight: her appetite small but eats when food is served.  Has gained 3 lbs.  4) GAD:  brought on and aggravated by her facial nerve  palsy.  She felt that the sertraline would help transiently but was  requiring serial increases in doses,  but now doing a very slow wean started Dec 28  by ES. . Still on the taper, did not help.   Outpatient Medications Prior to Visit  Medication Sig Dispense Refill   amLODipine (NORVASC) 5 MG tablet  TAKE 1 TABLET BY MOUTH DAILY 90 tablet 1   ASPIRIN 81 PO Take by mouth daily.     Biotin w/ Vitamins C & E (HAIR/SKIN/NAILS PO) Take by mouth daily.     Calcium Carbonate-Vit D-Min (CALCIUM 1200) 1200-1000 MG-UNIT CHEW Chew 1 tablet by mouth daily.     celecoxib (CELEBREX) 100 MG capsule Take 100 mg by mouth 2 (two) times daily.     Cholecalciferol (VITAMIN D-3) 25 MCG (1000 UT) CAPS Take by mouth.     denosumab (PROLIA) 60 MG/ML SOSY injection Inject into the skin.     erythromycin ophthalmic ointment Apply to sutures 4 times a day for 10-12 days.  Discontinue if allergy develops and call our office 3.5 g 2   losartan (COZAAR) 100 MG tablet Take 1 tablet (100 mg total) by mouth daily. 90 tablet 1   pravastatin (PRAVACHOL) 40 MG tablet TAKE 1 TABLET(40 MG) BY MOUTH DAILY 90 tablet 1   sertraline (ZOLOFT) 50 MG tablet Take 1.5 tablets (75 mg total) by mouth daily for 14 days, THEN 1 tablet (50 mg total) daily for 14 days, THEN 0.5 tablets (25 mg total) daily for 14 days. (Patient taking differently: Take 1.5 tablets (75 mg total) by mouth daily for 14 days, THEN 1 tablet (50 mg total) daily for 14 days, THEN 0.5 tablets (25 mg total) daily for 14 days. Pt states that she is taking 100 mg daily.) 42 tablet 0   Turmeric 500  MG CAPS Take 1 capsule by mouth 2 (two) times daily.     No facility-administered medications prior to visit.    Review of Systems;  Patient denies headache, fevers, malaise, unintentional weight loss, skin rash, eye pain, sinus congestion and sinus pain, sore throat, dysphagia,  hemoptysis , cough, dyspnea, wheezing, chest pain, palpitations, orthopnea, edema, abdominal pain, nausea, melena, diarrhea, constipation, flank pain, dysuria, hematuria, urinary  Frequency, nocturia, numbness, tingling, seizures,  Focal weakness, Loss of consciousness,  Tremor, insomnia, depression, anxiety, and suicidal ideation.      Objective:  BP (!) 154/76 (BP Location: Left Arm, Patient  Position: Sitting, Cuff Size: Normal)    Pulse 74    Temp 97.8 F (36.6 C) (Oral)    Ht 5' 2.5" (1.588 m)    Wt 113 lb 12.8 oz (51.6 kg)    SpO2 98%    BMI 20.48 kg/m   BP Readings from Last 3 Encounters:  03/13/21 (!) 154/76  02/11/21 (!) 150/70  11/18/20 (!) 165/60    Wt Readings from Last 3 Encounters:  03/13/21 113 lb 12.8 oz (51.6 kg)  02/11/21 110 lb 12.8 oz (50.3 kg)  11/18/20 115 lb 9.6 oz (52.4 kg)    General appearance: alert, cooperative and appears stated age Ears: normal TM's and external ear canals both ears Throat: lips, mucosa, and tongue normal; teeth and gums normal Neck: no adenopathy, no carotid bruit, supple, symmetrical, trachea midline and thyroid not enlarged, symmetric, no tenderness/mass/nodules Back: symmetric, no curvature. ROM normal. No CVA tenderness. Lungs: clear to auscultation bilaterally Heart: regular rate and rhythm, S1, S2 normal, no murmur, click, rub or gallop Abdomen: soft, non-tender; bowel sounds normal; no masses,  no organomegaly Pulses: 2+ and symmetric Skin: Skin color, texture, turgor normal. No rashes or lesions Lymph nodes: Cervical, supraclavicular, and axillary nodes normal. Neuro: left facial droop .  DTRs 2+/4 in biceps, brachioradialis, patellars and achilles. Muscle strength 5/5 in upper and lower exremities. Fine resting tremor bilaterally both hands , mildl loss of cerebellar function. Romberg negative.  No pronator drift.   Gait normal.    Lab Results  Component Value Date   HGBA1C 6.4 09/10/2020   HGBA1C 6.2 08/17/2019   HGBA1C 6.3 08/01/2018    Lab Results  Component Value Date   CREATININE 0.72 03/13/2021   CREATININE 0.77 02/11/2021   CREATININE 0.74 09/10/2020    Lab Results  Component Value Date   WBC 6.5 09/10/2020   HGB 11.8 (L) 09/10/2020   HCT 35.5 (L) 09/10/2020   PLT 192.0 09/10/2020   GLUCOSE 100 (H) 03/13/2021   CHOL 174 03/13/2021   TRIG 87.0 03/13/2021   HDL 77.00 03/13/2021   LDLDIRECT  137.0 01/06/2015   LDLCALC 80 03/13/2021   ALT 12 03/13/2021   AST 14 03/13/2021   NA 139 03/13/2021   K 4.0 03/13/2021   CL 103 03/13/2021   CREATININE 0.72 03/13/2021   BUN 28 (H) 03/13/2021   CO2 28 03/13/2021   TSH 1.18 10/19/2017   HGBA1C 6.4 09/10/2020   MICROALBUR 1.9 09/10/2020    No results found.  Assessment & Plan:   Problem List Items Addressed This Visit     Hypertension - Primary    Well controlled on current regimen of amlidipine and losartan. . Renal function has been  stable, no changes today.      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  Patient has increased statin therapy to 40 mg pravastatin and LDL is now 80   Lab Results  Component Value Date   CHOL 174 03/13/2021   HDL 77.00 03/13/2021   LDLCALC 80 03/13/2021   LDLDIRECT 137.0 01/06/2015   TRIG 87.0 03/13/2021   CHOLHDL 2 03/13/2021        Generalized anxiety disorder    Aggravated by acquired facial paralysis.  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  She has been offered corrective surgery and is considering this option,  Which I support.       I spent 30 minutes dedicated to the care of this patient on the date of this encounter to include pre-visit review of patient's medical history,  most recent imaging studies, Face-to-face time with the patient , and post visit ordering of testing and  therapeutics.    Follow-up: No follow-ups on file.   Crecencio Mc, MD

## 2021-03-13 NOTE — Patient Instructions (Addendum)
If you feel that you need to go back on a medication for your mood after you finish the sertraline taper, Let me know   I support your decision if you decide to have Dr Sabra Heck correct your facial droop. You are NOT being vain    Have your husband record a few home BP readings for you and send them to me

## 2021-03-15 NOTE — Assessment & Plan Note (Addendum)
Reviewed findings of prior CT scan today..  Patient has increased statin therapy to 40 mg pravastatin and LDL is now 80   Lab Results  Component Value Date   CHOL 174 03/13/2021   HDL 77.00 03/13/2021   LDLCALC 80 03/13/2021   LDLDIRECT 137.0 01/06/2015   TRIG 87.0 03/13/2021   CHOLHDL 2 03/13/2021

## 2021-03-15 NOTE — Assessment & Plan Note (Signed)
Aggravated by acquired facial paralysis.  She has decided to discontinue therapy and is weaning slowly

## 2021-03-15 NOTE — Assessment & Plan Note (Signed)
Well controlled on current regimen of amlidipine and losartan. . Renal function has been  stable, no changes today.

## 2021-03-15 NOTE — Assessment & Plan Note (Signed)
Her acquired facial deformity has been emotionally difficult to manage despite recurrent titrations of an SSRI.  She has been offered corrective surgery and is considering this option,  Which I support.

## 2021-03-15 NOTE — Assessment & Plan Note (Signed)
Her tumor has started to regrow and she has undregone  5 sessions of  XRT I n lieu of surgery sh has had improvement in the headaches  That began recurring,  But continues to have left facial nerve paralysis and intermittent vertigo

## 2021-03-16 DIAGNOSIS — H02235 Paralytic lagophthalmos left lower eyelid: Secondary | ICD-10-CM | POA: Diagnosis not present

## 2021-03-19 ENCOUNTER — Telehealth: Payer: Self-pay | Admitting: Internal Medicine

## 2021-03-19 NOTE — Telephone Encounter (Signed)
Patient scheduled for 04/15/21

## 2021-04-03 ENCOUNTER — Other Ambulatory Visit: Payer: Self-pay | Admitting: Internal Medicine

## 2021-04-03 ENCOUNTER — Other Ambulatory Visit: Payer: Self-pay | Admitting: Family Medicine

## 2021-04-15 ENCOUNTER — Other Ambulatory Visit: Payer: Self-pay

## 2021-04-15 ENCOUNTER — Ambulatory Visit (INDEPENDENT_AMBULATORY_CARE_PROVIDER_SITE_OTHER): Payer: Medicare Other | Admitting: *Deleted

## 2021-04-15 DIAGNOSIS — M810 Age-related osteoporosis without current pathological fracture: Secondary | ICD-10-CM | POA: Diagnosis not present

## 2021-04-15 MED ORDER — DENOSUMAB 60 MG/ML ~~LOC~~ SOSY
60.0000 mg | PREFILLED_SYRINGE | Freq: Once | SUBCUTANEOUS | Status: AC
  Administered 2021-04-15: 60 mg via SUBCUTANEOUS

## 2021-04-15 NOTE — Progress Notes (Signed)
Pt arrived for Prolia injection, given in L arm SQ. Pt tolerated injection well, showed no signs of distress nor voiced any concerns.  ? ?Pt was also notified that she is due for her next Prolia injection on 10/16/21 or after. Pt verbalized understanding.  ?

## 2021-04-21 DIAGNOSIS — H02402 Unspecified ptosis of left eyelid: Secondary | ICD-10-CM | POA: Diagnosis not present

## 2021-04-21 DIAGNOSIS — G510 Bell's palsy: Secondary | ICD-10-CM | POA: Diagnosis not present

## 2021-04-21 DIAGNOSIS — D333 Benign neoplasm of cranial nerves: Secondary | ICD-10-CM | POA: Diagnosis not present

## 2021-04-24 ENCOUNTER — Telehealth: Payer: Self-pay | Admitting: Internal Medicine

## 2021-04-24 NOTE — Telephone Encounter (Signed)
Spoke with pt and she stated that she my have taken too much medication but does not know when she did it. She stated that this morning when she went to take her medications she was already on her Saturday pills. Pt stated that she does not feel any different then she normally does. Pt did go ahead and take her pills like normal this morning even though it was in the Saturday box. I advised pt that since she feels normal and isn't sure if she take an day or when she did it that she should go ahead and continue taking medication like normal. I let pt know that I would talk with Dr. Derrel Nip about this also and give her a definite answer before this evening.  ?

## 2021-04-24 NOTE — Telephone Encounter (Signed)
Spoke with pt and informed her that she should not take her losartan or her celebrex until tomorrow. Pt gave a verbal understanding.  ?

## 2021-04-24 NOTE — Telephone Encounter (Signed)
Pt called in stating that she uses a medication dispenser and she notice that she is up to taking saturdays medication. Pt stated that she thinks she over taking medication and don't know what to do. Pt requesting callback  ?

## 2021-05-13 ENCOUNTER — Other Ambulatory Visit: Payer: Self-pay

## 2021-05-13 ENCOUNTER — Telehealth: Payer: Self-pay | Admitting: Internal Medicine

## 2021-05-13 MED ORDER — CELECOXIB 100 MG PO CAPS: 100.0000 mg | ORAL_CAPSULE | Freq: Two times a day (BID) | ORAL | 1 refills | Status: DC

## 2021-05-13 NOTE — Telephone Encounter (Signed)
Patient's pharmacy told patient she needs a new prescription for her celecoxib (CELEBREX) 100 MG capsule. ?

## 2021-05-25 ENCOUNTER — Ambulatory Visit (INDEPENDENT_AMBULATORY_CARE_PROVIDER_SITE_OTHER): Payer: Medicare Other

## 2021-05-25 VITALS — Ht 62.5 in | Wt 113.0 lb

## 2021-05-25 DIAGNOSIS — Z0000 Encounter for general adult medical examination without abnormal findings: Secondary | ICD-10-CM

## 2021-05-25 NOTE — Patient Instructions (Addendum)
[name] , Thank you for taking time to come for your Medicare Wellness Visit. I appreciate your ongoing commitment to your health goals. Please review the following plan we discussed and let me know if I can assist you in the future.   These are the goals we discussed:  Goals       Patient Stated     Maintain healthy lifestyle (pt-stated)      Stay active Stay hydrated Healthy diet        This is a list of the screening recommended for you and due dates:  Health Maintenance  Topic Date Due   COVID-19 Vaccine (4 - Booster) 06/10/2021*   Zoster (Shingles) Vaccine (1 of 2) 08/24/2021*   Flu Shot  09/15/2021   Tetanus Vaccine  10/12/2021   Pneumonia Vaccine  Completed   DEXA scan (bone density measurement)  Completed   HPV Vaccine  Aged Out   Mammogram  Discontinued  *Topic was postponed. The date shown is not the original due date.    Advanced directives: on file  Conditions/risks identified: Patient reports weight gain since starting anti-depressant medication. Notes medication is working very well for the depression but believes is making her gain weight. Notes weight increase up to 115lb on home scale. Plans to monitor and contact PCP if weight continues to increase, becoming uncomfortable.    Next appointment: Follow up in one year for your annual wellness visit    Preventive Care 65 Years and Older, Female Preventive care refers to lifestyle choices and visits with your health care provider that can promote health and wellness. What does preventive care include? A yearly physical exam. This is also called an annual well check. Dental exams once or twice a year. Routine eye exams. Ask your health care provider how often you should have your eyes checked. Personal lifestyle choices, including: Daily care of your teeth and gums. Regular physical activity. Eating a healthy diet. Avoiding tobacco and drug use. Limiting alcohol use. Practicing safe sex. Taking  low-dose aspirin every day. Taking vitamin and mineral supplements as recommended by your health care provider. What happens during an annual well check? The services and screenings done by your health care provider during your annual well check will depend on your age, overall health, lifestyle risk factors, and family history of disease. Counseling  Your health care provider may ask you questions about your: Alcohol use. Tobacco use. Drug use. Emotional well-being. Home and relationship well-being. Sexual activity. Eating habits. History of falls. Memory and ability to understand (cognition). Work and work Astronomer. Reproductive health. Screening  You may have the following tests or measurements: Height, weight, and BMI. Blood pressure. Lipid and cholesterol levels. These may be checked every 5 years, or more frequently if you are over 42 years old. Skin check. Lung cancer screening. You may have this screening every year starting at age 24 if you have a 30-pack-year history of smoking and currently smoke or have quit within the past 15 years. Fecal occult blood test (FOBT) of the stool. You may have this test every year starting at age 72. Flexible sigmoidoscopy or colonoscopy. You may have a sigmoidoscopy every 5 years or a colonoscopy every 10 years starting at age 65. Hepatitis C blood test. Hepatitis B blood test. Sexually transmitted disease (STD) testing. Diabetes screening. This is done by checking your blood sugar (glucose) after you have not eaten for a while (fasting). You may have this done every 1-3 years. Bone density scan.  This is done to screen for osteoporosis. You may have this done starting at age 3. Mammogram. This may be done every 1-2 years. Talk to your health care provider about how often you should have regular mammograms. Talk with your health care provider about your test results, treatment options, and if necessary, the need for more tests. Vaccines   Your health care provider may recommend certain vaccines, such as: Influenza vaccine. This is recommended every year. Tetanus, diphtheria, and acellular pertussis (Tdap, Td) vaccine. You may need a Td booster every 10 years. Zoster vaccine. You may need this after age 66. Pneumococcal 13-valent conjugate (PCV13) vaccine. One dose is recommended after age 57. Pneumococcal polysaccharide (PPSV23) vaccine. One dose is recommended after age 32. Talk to your health care provider about which screenings and vaccines you need and how often you need them. This information is not intended to replace advice given to you by your health care provider. Make sure you discuss any questions you have with your health care provider. Document Released: 02/28/2015 Document Revised: 10/22/2015 Document Reviewed: 12/03/2014 Elsevier Interactive Patient Education  2017 [employer].  Fall Prevention in the Home Falls can cause injuries. They can happen to people of all ages. There are many things you can do to make your home safe and to help prevent falls. What can I do on the outside of my home? Regularly fix the edges of walkways and driveways and fix any cracks. Remove anything that might make you trip as you walk through a door, such as a raised step or threshold. Trim any bushes or trees on the path to your home. Use bright outdoor lighting. Clear any walking paths of anything that might make someone trip, such as rocks or tools. Regularly check to see if handrails are loose or broken. Make sure that both sides of any steps have handrails. Any raised decks and porches should have guardrails on the edges. Have any leaves, snow, or ice cleared regularly. Use sand or salt on walking paths during winter. Clean up any spills in your garage right away. This includes oil or grease spills. What can I do in the bathroom? Use night lights. Install grab bars by the toilet and in the tub and shower. Do not use towel  bars as grab bars. Use non-skid mats or decals in the tub or shower. If you need to sit down in the shower, use a plastic, non-slip stool. Keep the floor dry. Clean up any water that spills on the floor as soon as it happens. Remove soap buildup in the tub or shower regularly. Attach bath mats securely with double-sided non-slip rug tape. Do not have throw rugs and other things on the floor that can make you trip. What can I do in the bedroom? Use night lights. Make sure that you have a light by your bed that is easy to reach. Do not use any sheets or blankets that are too big for your bed. They should not hang down onto the floor. Have a firm chair that has side arms. You can use this for support while you get dressed. Do not have throw rugs and other things on the floor that can make you trip. What can I do in the kitchen? Clean up any spills right away. Avoid walking on wet floors. Keep items that you use a lot in easy-to-reach places. If you need to reach something above you, use a strong step stool that has a grab bar. Keep electrical cords  out of the way. Do not use floor polish or wax that makes floors slippery. If you must use wax, use non-skid floor wax. Do not have throw rugs and other things on the floor that can make you trip. What can I do with my stairs? Do not leave any items on the stairs. Make sure that there are handrails on both sides of the stairs and use them. Fix handrails that are broken or loose. Make sure that handrails are as long as the stairways. Check any carpeting to make sure that it is firmly attached to the stairs. Fix any carpet that is loose or worn. Avoid having throw rugs at the top or bottom of the stairs. If you do have throw rugs, attach them to the floor with carpet tape. Make sure that you have a light switch at the top of the stairs and the bottom of the stairs. If you do not have them, ask someone to add them for you. What else can I do to help  prevent falls? Wear shoes that: Do not have high heels. Have rubber bottoms. Are comfortable and fit you well. Are closed at the toe. Do not wear sandals. If you use a stepladder: Make sure that it is fully opened. Do not climb a closed stepladder. Make sure that both sides of the stepladder are locked into place. Ask someone to hold it for you, if possible. Clearly mark and make sure that you can see: Any grab bars or handrails. First and last steps. Where the edge of each step is. Use tools that help you move around (mobility aids) if they are needed. These include: Canes. Walkers. Scooters. Crutches. Turn on the lights when you go into a dark area. Replace any light bulbs as soon as they burn out. Set up your furniture so you have a clear path. Avoid moving your furniture around. If any of your floors are uneven, fix them. If there are any pets around you, be aware of where they are. Review your medicines with your doctor. Some medicines can make you feel dizzy. This can increase your chance of falling. Ask your doctor what other things that you can do to help prevent falls. This information is not intended to replace advice given to you by your health care provider. Make sure you discuss any questions you have with your health care provider. Document Released: 11/28/2008 Document Revised: 07/10/2015 Document Reviewed: 03/08/2014 Elsevier Interactive Patient Education  2017 [employer].

## 2021-05-25 NOTE — Progress Notes (Addendum)
Subjective:   Rita Taylor is a 81 y.o. female who presents for Medicare Annual (Subsequent) preventive examination.  Review of Systems    No ROS.  Medicare Wellness Virtual Visit.  Visual/audio telehealth visit, UTA vital signs.   See social history for additional risk factors.   Cardiac Risk Factors include: advanced age (>71men, >47 women);hypertension     Objective:    Today's Vitals   05/25/21 0904  Weight: 113 lb (51.3 kg)  Height: 5' 2.5" (1.588 m)   Body mass index is 20.34 kg/m.     05/25/2021    9:21 AM 05/22/2020    9:31 AM 01/24/2020   12:09 PM 03/02/2019   10:33 AM 01/23/2015   10:30 AM  Advanced Directives  Does Patient Have a Medical Advance Directive? Yes Yes Yes Yes No  Type of Estate agent of [place];Living will Living will;Healthcare Power of [employer] Power of [place];Living will Living will;Healthcare Power of Attorney   Does patient want to make changes to medical advance directive? No - Patient declined No - Patient declined No - Patient declined No - Patient declined   Copy of Healthcare Power of Attorney in Chart? Yes - validated most recent copy scanned in chart (See row information) Yes - validated most recent copy scanned in chart (See row information) Yes - validated most recent copy scanned in chart (See row information) No - copy requested   Would patient like information on creating a medical advance directive?     Yes - Educational materials given    Current Medications (verified) Outpatient Encounter Medications as of 05/25/2021  Medication Sig   amLODipine (NORVASC) 5 MG tablet TAKE 1 TABLET BY MOUTH DAILY   ASPIRIN 81 PO Take by mouth daily.   Biotin w/ Vitamins C & E (HAIR/SKIN/NAILS PO) Take by mouth daily.   Calcium Carbonate-Vit D-Min (CALCIUM 1200) 1200-1000 MG-UNIT CHEW Chew 1 tablet by mouth daily.   celecoxib (CELEBREX) 100 MG capsule Take 1 capsule (100 mg total) by mouth 2 (two) times daily.    Cholecalciferol (VITAMIN D-3) 25 MCG (1000 UT) CAPS Take by mouth.   denosumab (PROLIA) 60 MG/ML SOSY injection Inject into the skin.   erythromycin ophthalmic ointment Apply to sutures 4 times a day for 10-12 days.  Discontinue if allergy develops and call our office   losartan (COZAAR) 100 MG tablet Take 1 tablet (100 mg total) by mouth daily.   pravastatin (PRAVACHOL) 40 MG tablet TAKE 1 TABLET(40 MG) BY MOUTH DAILY   sertraline (ZOLOFT) 50 MG tablet Take 1.5 tablets (75 mg total) by mouth daily for 14 days, THEN 1 tablet (50 mg total) daily for 14 days, THEN 0.5 tablets (25 mg total) daily for 14 days. (Patient taking differently: Take 1.5 tablets (75 mg total) by mouth daily for 14 days, THEN 1 tablet (50 mg total) daily for 14 days, THEN 0.5 tablets (25 mg total) daily for 14 days. Pt states that she is taking 100 mg daily.)   Turmeric 500 MG CAPS Take 1 capsule by mouth 2 (two) times daily.   No facility-administered encounter medications on file as of 05/25/2021.    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[facility])    Past Surgical History:  Procedure Laterality Date   bitubal ligation  1981   BREAST ENHANCEMENT SURGERY  1990   BROW LIFT Left 01/24/2020   Procedure: TARSORRHAPHY, LATERAL PLACEMENT LEFT LOWER LID;  Surgeon: Imagene Riches, MD;  Location: [facility];  Service: Ophthalmology;  Laterality: Left;   COLONOSCOPY     ECTROPION REPAIR Left 03/02/2019   Procedure: ECTROPION REPAIR, EXTENSIVE AND TARSORRHAPHY, LATERAL PLACEMENT OF LEFT LOWER LID;  Surgeon: Imagene Riches, MD;  Location: [facility];  Service: Ophthalmology;  Laterality:  Left;   FACIAL COSMETIC SURGERY     GYNECOLOGIC CRYOSURGERY     15 years ago, normal since then, was treated with antibiotics, Annual pap smears for the past 20 years   TUMOR EXCISION Left 06/2018   ear   Family History  Problem Relation Age of Onset   Hypertension Mother    Hypertension Father    Hypertension Brother    Social History   Socioeconomic History   Marital status: Married    Spouse name: Not on file   Number of children: Not on file   Years of education: Not on file   Highest education level: Not on file  Occupational History   Not on file  Tobacco Use   Smoking status: Never   Smokeless tobacco: Never  Vaping Use   Vaping Use: Never used  Substance and Sexual Activity   Alcohol use: Yes    Comment: moderate half a glass daily   Drug use: No   Sexual activity: Yes  Other Topics Concern   Not on file  Social History Narrative   Lives with spouse.   Has 2 dogs and one cat, sold a horse farm years ago.   Exercises regularly 5 days a week at [employer] and also walks the dog   Social Determinants of Health   Financial Resource Strain: Low Risk    Difficulty of Paying Living Expenses: Not hard at all  Food Insecurity: No Food Insecurity   Worried About Programme researcher, broadcasting/film/video in the Last Year: Never true   Ran Out of Food in the Last Year: Never true  Transportation Needs: No Transportation Needs   Lack of Transportation (Medical): No   Lack of Transportation (Non-Medical): No  Physical Activity: Not on file  Stress: No Stress Concern Present   Feeling of Stress : Not at all  Social Connections: Unknown   Frequency of Communication with Friends and Family: Not on file   Frequency of Social Gatherings with Friends and Family: Not on file   Attends Religious Services: Not on file   Active Member of Clubs or Organizations: Not on file   Attends Banker Meetings: Not on file   Marital Status: Married    Tobacco Counseling Counseling given: Not  Answered   Clinical Intake:  Pre-visit preparation completed: Yes        Diabetes: No  How often do you need to have someone help you when you read instructions, pamphlets, or other written materials from your doctor or pharmacy?: 1 - Never  Interpreter Needed?: No      Activities of Daily Living    05/25/2021    9:09 AM  In your present state of health, do you have any difficulty performing the following activities:  Hearing? 1  Comment L ear deafness  Vision? 1  Comment Sees ophthalmologist every 2 months  Difficulty concentrating  or making decisions? 0  Walking or climbing stairs? 0  Dressing or bathing? 0  Doing errands, shopping? 1  Comment She does not drive due to vision  Preparing Food and eating ? N  Using the Toilet? N  In the past six months, have you accidently leaked urine? Y  Comment Wears daily pads. Stress incontinence.  Do you have problems with loss of bowel control? N  Managing your Medications? N  Managing your Finances? Y  Comment Son assist as needed  Housekeeping or managing your Housekeeping? N    Patient Care Team: [name], MD as PCP - General (Internal Medicine)  Indicate any recent Medical Services you may have received from other than [facility] providers in the past year (date may be approximate).     Assessment:   This is a routine wellness examination for Rita Taylor.  Virtual Visit via Telephone Note  I connected with  Rita Taylor on 05/25/21 at  9:00 AM EDT by telephone and verified that I am speaking with the correct person using two identifiers.  Persons participating in the virtual visit: patient/Nurse Health Advisor   I discussed the limitations of performing an evaluation and management service by telehealth.  The patient expressed understanding and agreed to proceed. We continued and completed visit with audio only. Some vital signs may be absent or patient reported.   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 MRI every 3 months at [facility] Screening - Comments:: Bilateral cataracts removed  Wears glasses Visits ophthalmologist every 2 months  Dietary issues and exercise activities discussed: Current Exercise Habits: The patient does not participate in regular exercise at present Healthy diet Good water intake   Goals Addressed               This Visit's Progress     Patient Stated     Maintain healthy lifestyle (pt-stated)        Stay active Stay hydrated Healthy diet       Depression Screen    05/25/2021    9:09 AM 03/13/2021    9:21 AM 02/11/2021    1:17 PM 09/10/2020    9:11 AM 05/22/2020    9:11 AM 02/18/2020    9:09 AM 02/14/2019    3:27 PM  PHQ 2/9 Scores  PHQ - 2 Score 0 4 0 0 0 0 0  PHQ- 9 Score  8    0 0    Fall Risk    05/25/2021    9:08 AM 03/13/2021    9:18 AM 02/11/2021    1:17 PM 09/10/2020    9:11 AM 05/22/2020    9:15 AM  Fall Risk   Falls in the past year? 0 0 0 0 0  Number falls in past yr: 0  0  0  Injury with Fall?   0  0  Risk for fall due to :  No Fall Risks     Follow up Falls evaluation completed Falls evaluation completed Falls evaluation completed Falls evaluation completed Falls evaluation completed   FALL RISK PREVENTION PERTAINING TO THE HOME: Home free of loose throw rugs in walkways, pet beds, electrical cords, etc? Yes  Adequate lighting in your home to reduce risk of falls? Yes   ASSISTIVE DEVICES UTILIZED TO PREVENT FALLS: Life alert? No  Use of a cane, walker or w/c? Yes , as needed Grab bars in the bathroom? No  Shower chair or  bench in shower? No  Elevated toilet seat or a handicapped toilet? No   TIMED UP AND GO: Was the test performed? No .   Cognitive Function: Patient is alert and oriented x3.  Enjoys reading. Manages her own finances and medications.     01/23/2015   10:45 AM  MMSE - Mini Mental State Exam  Orientation to time 5  Orientation to Place 5   Registration 3  Attention/ Calculation 5  Recall 3  Language- name 2 objects 2  Language- repeat 1  Language- follow 3 step command 3  Language- read & follow direction 1  Write a sentence 1  Copy design 1  Total score 30        Immunizations Immunization History  Administered Date(s) Administered   Influenza Split 11/15/2011   Influenza, High Dose Seasonal PF 11/10/2017, 10/13/2018   Influenza,inj,Quad PF,6+ Mos 11/08/2012, 11/12/2013   Influenza-Unspecified 11/06/2014, 10/30/2015, 10/11/2018, 11/29/2019, 11/27/2020   Moderna Sars-Covid-2 Vaccination 03/24/2019, 04/14/2019, 12/20/2019   Pneumococcal Conjugate-13 11/12/2013   Pneumococcal Polysaccharide-23 11/08/2012   Tdap 10/13/2011   Zoster, Live 07/03/2008   Covid vaccine- 3 completed.   Shingrix Completed?: No.    Education has been provided regarding the importance of this vaccine. Patient has been advised to call insurance company to determine out of pocket expense if they have not yet received this vaccine. Advised may also receive vaccine at local pharmacy or Health Dept. Verbalized acceptance and understanding.  Screening Tests Health Maintenance  Topic Date Due   COVID-19 Vaccine (4 - Booster) 06/10/2021 (Originally 02/14/2020)   Zoster Vaccines- Shingrix (1 of 2) 08/24/2021 (Originally 11/11/1959)   INFLUENZA VACCINE  09/15/2021   TETANUS/TDAP  10/12/2021   Pneumonia Vaccine 12+ Years old  Completed   DEXA SCAN  Completed   HPV VACCINES  Aged Out   MAMMOGRAM  Discontinued   Health Maintenance There are no preventive care reminders to display for this patient.  Lung Cancer Screening: (Low Dose CT Chest recommended if Age 47-80 years, 30 pack-year currently smoking OR have quit w/in 15years.) does not qualify.   Hepatitis C Screening: does not qualify  Vision Screening: Recommended annual ophthalmology exams for early detection of glaucoma and other disorders of the eye.  Dental Screening: Recommended  annual dental exams for proper oral hygiene  Community Resource Referral / Chronic Care Management: CRR required this visit?  No   CCM required this visit?  No      Plan:   Keep all routine maintenance appointments.   Patient reports weight gain since starting anti-depressant medication. Notes medication is working very well for the depression but believes is making her gain weight. Notes weight increase up to 115lb on home scale. Plans to monitor and contact PCP if weight continues to increase, becoming uncomfortable. Nurse encourages healthy diet and exercise.   I have personally reviewed and noted the following in the patient's chart:   Medical and social history Use of alcohol, tobacco or illicit drugs  Current medications and supplements including opioid prescriptions.  Functional ability and status Nutritional status Physical activity Advanced directives List of other physicians Hospitalizations, surgeries, and ER visits in previous 12 months Vitals Screenings to include cognitive, depression, and falls Referrals and appointments  In addition, I have reviewed and discussed with patient certain preventive protocols, quality metrics, and best practice recommendations. A written personalized care plan for preventive services as well as general preventive health recommendations were provided to patient.     OBrien-Blaney, Jadee Golebiewski L, LPN  05/25/2021     I have reviewed the above information and agree with above.   Duncan Dull, MD

## 2021-05-26 DIAGNOSIS — Z9889 Other specified postprocedural states: Secondary | ICD-10-CM | POA: Diagnosis not present

## 2021-05-26 DIAGNOSIS — D333 Benign neoplasm of cranial nerves: Secondary | ICD-10-CM | POA: Diagnosis not present

## 2021-05-26 DIAGNOSIS — E236 Other disorders of pituitary gland: Secondary | ICD-10-CM | POA: Diagnosis not present

## 2021-05-27 DIAGNOSIS — G518 Other disorders of facial nerve: Secondary | ICD-10-CM | POA: Diagnosis not present

## 2021-05-27 DIAGNOSIS — H918X2 Other specified hearing loss, left ear: Secondary | ICD-10-CM | POA: Diagnosis not present

## 2021-05-27 DIAGNOSIS — D333 Benign neoplasm of cranial nerves: Secondary | ICD-10-CM | POA: Diagnosis not present

## 2021-07-03 ENCOUNTER — Encounter: Payer: Self-pay | Admitting: Internal Medicine

## 2021-07-03 ENCOUNTER — Ambulatory Visit (INDEPENDENT_AMBULATORY_CARE_PROVIDER_SITE_OTHER): Payer: Medicare Other | Admitting: Internal Medicine

## 2021-07-03 VITALS — BP 140/88 | HR 84 | Temp 98.6°F | Wt 114.0 lb

## 2021-07-03 DIAGNOSIS — F320 Major depressive disorder, single episode, mild: Secondary | ICD-10-CM

## 2021-07-03 DIAGNOSIS — R7303 Prediabetes: Secondary | ICD-10-CM | POA: Diagnosis not present

## 2021-07-03 DIAGNOSIS — E7800 Pure hypercholesterolemia, unspecified: Secondary | ICD-10-CM | POA: Diagnosis not present

## 2021-07-03 DIAGNOSIS — G8929 Other chronic pain: Secondary | ICD-10-CM

## 2021-07-03 DIAGNOSIS — M546 Pain in thoracic spine: Secondary | ICD-10-CM | POA: Diagnosis not present

## 2021-07-03 MED ORDER — BUPROPION HCL ER (XL) 150 MG PO TB24: 150.0000 mg | ORAL_TABLET | Freq: Every day | ORAL | 2 refills | Status: DC

## 2021-07-03 NOTE — Patient Instructions (Signed)
You can stop the celebrex anytime without a taper ,  and resume if you find that it was helping your back  Starting wellbutrin for depression .  Take dose in the morning with breakfast   Lab Results  Component Value Date   HGBA1C 6.4 09/10/2020

## 2021-07-03 NOTE — Progress Notes (Signed)
Subjective:  Patient ID: Rita Taylor, female    DOB: Jan 30, 1941  Age: 81 y.o. MRN: [id number]  CC: The primary encounter diagnosis was Prediabetes. Diagnoses of Pure hypercholesterolemia, Current mild episode of major depressive disorder without prior episode ([place]), and Chronic bilateral thoracic back pain were also pertinent to this visit.   HPI Rita Taylor presents for  Chief Complaint  Patient presents with   Follow-up    Follow up to discuss medication change   1) she has been having morning back pain  of the left side after using weed eater for several hours last weekend.  Denies pain with inspitration   2) follow up on depression:  she reports that she has no motivation for previously enjoyed activities.  Feels sad all the time .  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  She has met with the the plastic surgeon who has recommended two procedures:  1) a facelift on the left and 2) a procedure to amend the jowl she has developed .  She is apprehensive about the second procedure as it is unfamiliar to her but recognizes that her facial appearance has been a considerable  source of anxiety and disappointment    Outpatient Medications Prior to Visit  Medication Sig Dispense Refill   amLODipine (NORVASC) 5 MG tablet TAKE 1 TABLET BY MOUTH DAILY 90 tablet 1   ASPIRIN 81 PO Take by mouth daily.     Biotin w/ Vitamins C & E (HAIR/SKIN/NAILS PO) Take by mouth daily.     Calcium Carbonate-Vit D-Min (CALCIUM 1200) 1200-1000 MG-UNIT CHEW Chew 1 tablet by mouth daily.     celecoxib (CELEBREX) 100 MG capsule Take 1 capsule (100 mg total) by mouth 2 (two) times daily. 90 capsule 1   Cholecalciferol (VITAMIN D-3) 25 MCG (1000 UT) CAPS Take by mouth.     denosumab (PROLIA) 60 MG/ML SOSY injection Inject into the skin.     erythromycin ophthalmic ointment Apply to sutures 4 times a day for 10-12 days.  Discontinue if allergy develops and call our office 3.5 g 2    losartan (COZAAR) 100 MG tablet Take 1 tablet (100 mg total) by mouth daily. 90 tablet 1   pravastatin (PRAVACHOL) 40 MG tablet TAKE 1 TABLET(40 MG) BY MOUTH DAILY 90 tablet 1   Turmeric 500 MG CAPS Take 1 capsule by mouth 2 (two) times daily.     sertraline (ZOLOFT) 50 MG tablet Take 1.5 tablets (75 mg total) by mouth daily for 14 days, THEN 1 tablet (50 mg total) daily for 14 days, THEN 0.5 tablets (25 mg total) daily for 14 days. (Patient taking differently: Take 1.5 tablets (75 mg total) by mouth daily for 14 days, THEN 1 tablet (50 mg total) daily for 14 days, THEN 0.5 tablets (25 mg total) daily for 14 days. Pt states that she is taking 100 mg daily.) 42 tablet 0   No facility-administered medications prior to visit.    Review of Systems;  Patient denies headache, fevers, malaise, unintentional weight loss, skin rash, eye pain, sinus congestion and sinus pain, sore throat, dysphagia,  hemoptysis , cough, dyspnea, wheezing, chest pain, palpitations, orthopnea, edema, abdominal pain, nausea, melena, diarrhea, constipation, flank pain, dysuria, hematuria, urinary  Frequency, nocturia, numbness, tingling, seizures,  Focal weakness, Loss of consciousness,  Tremor, insomnia,  and suicidal ideation.      Objective:  BP 140/88   Pulse 84   Temp 98.6 F (37 C)  Wt 114 lb (51.7 kg)   BMI 20.52 kg/m   BP Readings from Last 3 Encounters:  07/03/21 140/88  03/13/21 (!) 154/76  02/11/21 (!) 150/70    Wt Readings from Last 3 Encounters:  07/03/21 114 lb (51.7 kg)  05/25/21 113 lb (51.3 kg)  03/13/21 113 lb 12.8 oz (51.6 kg)    General appearance: alert, cooperative and appears stated age Ears: normal TM's and external ear canals both ears Face:  left sided facial droop , with ptosis and jowl formation Throat: lips, mucosa, and tongue normal; teeth and gums normal Neck: no adenopathy, no carotid bruit, supple, symmetrical, trachea midline and thyroid not enlarged, symmetric, no  tenderness/mass/nodules Back: symmetric, no curvature. ROM normal. No CVA tenderness. Lungs: clear to auscultation bilaterally Heart: regular rate and rhythm, S1, S2 normal, no murmur, click, rub or gallop Abdomen: soft, non-tender; bowel sounds normal; no masses,  no organomegaly Pulses: 2+ and symmetric Skin: Skin color, texture, turgor normal. No rashes or lesions Lymph nodes: Cervical, supraclavicular, and axillary nodes normal.  Lab Results  Component Value Date   HGBA1C 6.4 09/10/2020   HGBA1C 6.2 08/17/2019   HGBA1C 6.3 08/01/2018    Lab Results  Component Value Date   CREATININE 0.72 03/13/2021   CREATININE 0.77 02/11/2021   CREATININE 0.74 09/10/2020    Lab Results  Component Value Date   WBC 6.5 09/10/2020   HGB 11.8 (L) 09/10/2020   HCT 35.5 (L) 09/10/2020   PLT 192.0 09/10/2020   GLUCOSE 100 (H) 03/13/2021   CHOL 174 03/13/2021   TRIG 87.0 03/13/2021   HDL 77.00 03/13/2021   LDLDIRECT 137.0 01/06/2015   LDLCALC 80 03/13/2021   ALT 12 03/13/2021   AST 14 03/13/2021   NA 139 03/13/2021   K 4.0 03/13/2021   CL 103 03/13/2021   CREATININE 0.72 03/13/2021   BUN 28 (H) 03/13/2021   CO2 28 03/13/2021   TSH 1.18 10/19/2017   HGBA1C 6.4 09/10/2020   MICROALBUR 1.9 09/10/2020    No results found.  Assessment & Plan:   Problem List Items Addressed This Visit     Prediabetes - Primary   Relevant Orders   Hemoglobin A1c   Comprehensive metabolic panel   Hyperlipidemia   Relevant Orders   Lipid Panel w/reflex Direct LDL   Major depressive disorder with current active episode    Starting wellbutin XL 150 mg for symptoms of anhedonia, lack of motivation       Relevant Medications   buPROPion (WELLBUTRIN XL) 150 MG 24 hr tablet   Chronic bilateral back pain    Exam c/w muscel strain from [place] of weedeater       Relevant Medications   buPROPion (WELLBUTRIN XL) 150 MG 24 hr tablet    I spent a total of  22 minutes with this patient in a face to face  visit on the date of this encounter r in counselling on her new onset depression , her consideration of plastic surgery , and  post visit ordering of testing and therapeutics.   d Follow-up: Return in about 3 months (around 10/03/2021).   Crecencio Mc, MD

## 2021-07-05 ENCOUNTER — Encounter: Payer: Self-pay | Admitting: Internal Medicine

## 2021-07-05 DIAGNOSIS — F329 Major depressive disorder, single episode, unspecified: Secondary | ICD-10-CM | POA: Insufficient documentation

## 2021-07-05 NOTE — Assessment & Plan Note (Signed)
Starting wellbutin XL 150 mg for symptoms of anhedonia, lack of motivation

## 2021-07-05 NOTE — Assessment & Plan Note (Signed)
Exam c/w muscel strain from [place] of weedeater

## 2021-07-07 DIAGNOSIS — G5702 Lesion of sciatic nerve, left lower limb: Secondary | ICD-10-CM | POA: Diagnosis not present

## 2021-07-08 DIAGNOSIS — H26493 Other secondary cataract, bilateral: Secondary | ICD-10-CM | POA: Diagnosis not present

## 2021-07-08 DIAGNOSIS — Z961 Presence of intraocular lens: Secondary | ICD-10-CM | POA: Diagnosis not present

## 2021-07-08 DIAGNOSIS — H168 Other keratitis: Secondary | ICD-10-CM | POA: Diagnosis not present

## 2021-07-08 DIAGNOSIS — H02235 Paralytic lagophthalmos left lower eyelid: Secondary | ICD-10-CM | POA: Diagnosis not present

## 2021-07-19 ENCOUNTER — Other Ambulatory Visit: Payer: Self-pay | Admitting: Internal Medicine

## 2021-07-20 DIAGNOSIS — G5702 Lesion of sciatic nerve, left lower limb: Secondary | ICD-10-CM | POA: Diagnosis not present

## 2021-07-20 DIAGNOSIS — M25551 Pain in right hip: Secondary | ICD-10-CM | POA: Diagnosis not present

## 2021-07-29 ENCOUNTER — Other Ambulatory Visit: Payer: Self-pay | Admitting: Internal Medicine

## 2021-07-29 DIAGNOSIS — M5431 Sciatica, right side: Secondary | ICD-10-CM | POA: Insufficient documentation

## 2021-07-29 DIAGNOSIS — I10 Essential (primary) hypertension: Secondary | ICD-10-CM

## 2021-08-04 DIAGNOSIS — M25551 Pain in right hip: Secondary | ICD-10-CM | POA: Diagnosis not present

## 2021-08-10 DIAGNOSIS — M25551 Pain in right hip: Secondary | ICD-10-CM | POA: Diagnosis not present

## 2021-08-11 ENCOUNTER — Other Ambulatory Visit: Payer: Self-pay | Admitting: Internal Medicine

## 2021-08-13 DIAGNOSIS — M5459 Other low back pain: Secondary | ICD-10-CM | POA: Diagnosis not present

## 2021-08-13 DIAGNOSIS — M25551 Pain in right hip: Secondary | ICD-10-CM | POA: Diagnosis not present

## 2021-08-13 DIAGNOSIS — M7918 Myalgia, other site: Secondary | ICD-10-CM | POA: Insufficient documentation

## 2021-08-19 DIAGNOSIS — H0223B Paralytic lagophthalmos left eye, upper and lower eyelids: Secondary | ICD-10-CM | POA: Diagnosis not present

## 2021-08-19 DIAGNOSIS — G510 Bell's palsy: Secondary | ICD-10-CM | POA: Diagnosis not present

## 2021-08-19 DIAGNOSIS — D3611 Benign neoplasm of peripheral nerves and autonomic nervous system of face, head, and neck: Secondary | ICD-10-CM | POA: Diagnosis not present

## 2021-08-24 ENCOUNTER — Other Ambulatory Visit: Payer: Self-pay | Admitting: Internal Medicine

## 2021-08-25 ENCOUNTER — Other Ambulatory Visit (INDEPENDENT_AMBULATORY_CARE_PROVIDER_SITE_OTHER): Payer: Medicare Other

## 2021-08-25 DIAGNOSIS — E7800 Pure hypercholesterolemia, unspecified: Secondary | ICD-10-CM | POA: Diagnosis not present

## 2021-08-25 DIAGNOSIS — R7303 Prediabetes: Secondary | ICD-10-CM

## 2021-08-25 LAB — COMPREHENSIVE METABOLIC PANEL
ALT: 16 U/L (ref 0–35)
AST: 16 U/L (ref 0–37)
Albumin: 4 g/dL (ref 3.5–5.2)
Alkaline Phosphatase: 45 U/L (ref 39–117)
BUN: 20 mg/dL (ref 6–23)
CO2: 26 mEq/L (ref 19–32)
Calcium: 8.9 mg/dL (ref 8.4–10.5)
Chloride: 105 mEq/L (ref 96–112)
Creatinine, Ser: 0.75 mg/dL (ref 0.40–1.20)
GFR: 74.47 mL/min (ref >60.00)
Glucose, Bld: 112 mg/dL — ABNORMAL HIGH (ref 70–99)
Potassium: 4.1 mEq/L (ref 3.5–5.1)
Sodium: 138 mEq/L (ref 135–145)
Total Bilirubin: 0.6 mg/dL (ref 0.2–1.2)
Total Protein: 6.5 g/dL (ref 6.0–8.3)

## 2021-08-25 LAB — HEMOGLOBIN A1C: Hgb A1c MFr Bld: 6.6 % — ABNORMAL HIGH (ref 4.6–6.5)

## 2021-08-26 LAB — LIPID PANEL W/REFLEX DIRECT LDL
Cholesterol: 167 mg/dL (ref <200)
HDL: 72 mg/dL (ref >=50)
LDL Cholesterol (Calc): 78 mg/dL (calc)
Non-HDL Cholesterol (Calc): 95 mg/dL (calc) (ref <130)
Total CHOL/HDL Ratio: 2.3 (calc) (ref <5.0)
Triglycerides: 89 mg/dL (ref <150)

## 2021-08-31 DIAGNOSIS — G629 Polyneuropathy, unspecified: Secondary | ICD-10-CM | POA: Diagnosis not present

## 2021-08-31 DIAGNOSIS — M5459 Other low back pain: Secondary | ICD-10-CM | POA: Diagnosis not present

## 2021-08-31 DIAGNOSIS — M25551 Pain in right hip: Secondary | ICD-10-CM | POA: Diagnosis not present

## 2021-08-31 DIAGNOSIS — G5702 Lesion of sciatic nerve, left lower limb: Secondary | ICD-10-CM | POA: Diagnosis not present

## 2021-09-01 DIAGNOSIS — M5459 Other low back pain: Secondary | ICD-10-CM | POA: Diagnosis not present

## 2021-09-01 DIAGNOSIS — M25551 Pain in right hip: Secondary | ICD-10-CM | POA: Diagnosis not present

## 2021-09-01 DIAGNOSIS — M6281 Muscle weakness (generalized): Secondary | ICD-10-CM | POA: Diagnosis not present

## 2021-09-03 DIAGNOSIS — M5459 Other low back pain: Secondary | ICD-10-CM | POA: Diagnosis not present

## 2021-09-03 DIAGNOSIS — M6281 Muscle weakness (generalized): Secondary | ICD-10-CM | POA: Insufficient documentation

## 2021-09-03 DIAGNOSIS — M25551 Pain in right hip: Secondary | ICD-10-CM | POA: Diagnosis not present

## 2021-09-07 DIAGNOSIS — M5459 Other low back pain: Secondary | ICD-10-CM | POA: Diagnosis not present

## 2021-09-07 DIAGNOSIS — M25551 Pain in right hip: Secondary | ICD-10-CM | POA: Diagnosis not present

## 2021-09-07 DIAGNOSIS — M6281 Muscle weakness (generalized): Secondary | ICD-10-CM | POA: Diagnosis not present

## 2021-09-10 ENCOUNTER — Ambulatory Visit (INDEPENDENT_AMBULATORY_CARE_PROVIDER_SITE_OTHER): Payer: Medicare Other | Admitting: Internal Medicine

## 2021-09-10 ENCOUNTER — Encounter: Payer: Self-pay | Admitting: Internal Medicine

## 2021-09-10 ENCOUNTER — Ambulatory Visit (INDEPENDENT_AMBULATORY_CARE_PROVIDER_SITE_OTHER): Payer: Medicare Other

## 2021-09-10 VITALS — BP 116/68 | HR 87 | Temp 98.1°F | Ht 62.5 in | Wt 104.8 lb

## 2021-09-10 DIAGNOSIS — M5416 Radiculopathy, lumbar region: Secondary | ICD-10-CM

## 2021-09-10 DIAGNOSIS — M21371 Foot drop, right foot: Secondary | ICD-10-CM | POA: Diagnosis not present

## 2021-09-10 DIAGNOSIS — D333 Benign neoplasm of cranial nerves: Secondary | ICD-10-CM

## 2021-09-10 DIAGNOSIS — E119 Type 2 diabetes mellitus without complications: Secondary | ICD-10-CM

## 2021-09-10 DIAGNOSIS — F320 Major depressive disorder, single episode, mild: Secondary | ICD-10-CM | POA: Diagnosis not present

## 2021-09-10 DIAGNOSIS — M79604 Pain in right leg: Secondary | ICD-10-CM

## 2021-09-10 DIAGNOSIS — I10 Essential (primary) hypertension: Secondary | ICD-10-CM

## 2021-09-10 DIAGNOSIS — M545 Low back pain, unspecified: Secondary | ICD-10-CM | POA: Diagnosis not present

## 2021-09-10 MED ORDER — TRAMADOL HCL 50 MG PO TABS: 50.0000 mg | ORAL_TABLET | Freq: Four times a day (QID) | ORAL | 0 refills | Status: AC | PRN

## 2021-09-10 MED ORDER — GABAPENTIN 100 MG PO CAPS: 100.0000 mg | ORAL_CAPSULE | Freq: Three times a day (TID) | ORAL | 3 refills | Status: DC

## 2021-09-10 NOTE — Assessment & Plan Note (Signed)
Well controlled on current regimen. Renal function stable, no changes today. 

## 2021-09-10 NOTE — Assessment & Plan Note (Addendum)
Etiology unclear. Now present for the past month,  Occurred 1 month after receving I/A steroid injection in right hip.  Denies back pain .  Plain films and neurology evaluation advised and ordered .

## 2021-09-10 NOTE — Assessment & Plan Note (Addendum)
Secondary to family history of type 2 DM  . Patient is by no means overweight or sedentary.  She had  reduced her a1c from 6.5 to 5.4 last year on a a low GI diet  But repeat a1c is  higher again due to overindulgence in sweets .  She is taking a statin and ARB.  Lab Results  Component Value Date   HGBA1C 6.6 (H) 08/25/2021

## 2021-09-10 NOTE — Patient Instructions (Addendum)
Stop the celebrex now because of your upcoming surgery.  You surgeon  may not know you have been taking it.    You can continue tylenol  2000 mg and resume gabapentin as needed up to 3 daily  . Also can add tramadol   I am sending you to Neurology for evaluation of your right foot drop

## 2021-09-10 NOTE — Assessment & Plan Note (Signed)
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  She has decided to undergo corrective surgery and is scheduled for Monday.  Advised to stop taking celebrex today

## 2021-09-10 NOTE — Progress Notes (Signed)
Subjective:  Patient ID: Rita Taylor, female    DOB: [birth year]  Age: 81 y.o. MRN: [id number]  CC: The primary encounter diagnosis was Lumbar radiculopathy, right. 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   HPI Rita Taylor presents for  Chief Complaint  Patient presents with   Follow-up    6 month follow up on hypertension & diabetes    1) HTN:  Hypertension: patient checks blood pressure twice weekly at home.  Readings have been for the most part < 140/80 at rest . Patient is following a reduced salt diet most days and is taking medications as prescribed   2) T2DM:    she has "reined in " her diet after having several months of indulging.   3) Facial palsy:  she is having surgery on Monday to correct the complications of her prior schwannoma surgery   4) Depression:  no change with wellbutrin. The source of her depression is her facial disfigurement, which I am inclined to agree with.   5) h/o pyriformis syndrome.  She has been having another "spell" ; after years of quiescence,   and realized that she had stopped doing her exercises religiously  for unclear reasons (depression,  husband's declining health).  She has been under the care of [facility] and had 2 injections which were ineffective .  Then had an ultrasound guided injection  2 months ago  which  resolved the ain in hip but  one month after the successful injection she developed pain in the right  thigh, stabbing in quality,  worse at night when sitting watching TV,  lateral and posterior thigh and dorsum of foot involving the big toe.   Using celebrex and tylenol.  The pain is improving. Currently taking tylenol at night and celebrex twice daily along with gabapentin once daily ;  she was prescribed it  twice  daily (prescribed by [facility]  initially ), then the dose was reduced.  HOwever   She has developed weakness in tthe right foot that was noticed by PT but not brought to Orthopedics attention   PATIENT HAS FOOT DROP ON THE RIGHT.  DTRS ARE NORMAL.     Outpatient Medications Prior to Visit  Medication Sig Dispense Refill   amLODipine (NORVASC) 5 MG tablet TAKE 1 TABLET BY MOUTH DAILY 90 tablet 1   ASPIRIN 81 PO Take by mouth daily.     Biotin w/ Vitamins C & E (HAIR/SKIN/NAILS PO) Take by mouth daily.     buPROPion (WELLBUTRIN XL) 150 MG 24 hr tablet Take 1 tablet (150 mg total) by mouth daily. 30 tablet 2   Calcium Carbonate-Vit D-Min (CALCIUM 1200) 1200-1000 MG-UNIT CHEW Chew 1 tablet by mouth daily.     celecoxib (CELEBREX) 100 MG capsule TAKE 1 CAPSULE(100 MG) BY MOUTH TWICE DAILY 90 capsule 1   Cholecalciferol (VITAMIN D-3) 25 MCG (1000 UT) CAPS Take by mouth.     denosumab (PROLIA) 60 MG/ML SOSY injection Inject into the skin.     erythromycin ophthalmic ointment Apply to sutures 4 times a day for 10-12 days.  Discontinue if allergy develops and call our office 3.5 g 2   losartan (COZAAR) 100 MG tablet TAKE 1 TABLET(100 MG) BY MOUTH DAILY 90 tablet 1   pravastatin (PRAVACHOL) 40 MG  tablet TAKE 1 TABLET(40 MG) BY MOUTH DAILY 90 tablet 1   Turmeric 500 MG CAPS Take 1 capsule by mouth 2 (two) times daily. (Patient not taking: Reported on 09/10/2021)     No facility-administered medications prior to visit.    Review of Systems;  Patient denies headache, fevers, malaise, unintentional weight loss, skin rash, eye pain, sinus congestion and sinus pain, sore throat, dysphagia,  hemoptysis , cough, dyspnea, wheezing, chest pain, palpitations, orthopnea, edema, abdominal pain, nausea, melena, diarrhea, constipation, flank pain, dysuria, hematuria, urinary  Frequency, nocturia, numbness, tingling, seizures,  Focal weakness, Loss of consciousness,  Tremor, insomnia, depression, anxiety, and  suicidal ideation.      Objective:  BP 116/68 (BP Location: Left Arm, Patient Position: Sitting, Cuff Size: Small)   Pulse 87   Temp 98.1 F (36.7 C) (Oral)   Ht 5' 2.5" (1.588 m)   Wt 104 lb 12.8 oz (47.5 kg)   SpO2 99%   BMI 18.86 kg/m   BP Readings from Last 3 Encounters:  09/10/21 116/68  07/03/21 140/88  03/13/21 (!) 154/76    Wt Readings from Last 3 Encounters:  09/10/21 104 lb 12.8 oz (47.5 kg)  07/03/21 114 lb (51.7 kg)  05/25/21 113 lb (51.3 kg)    General appearance: alert, cooperative and appears stated age Ears: normal TM's and external ear canals both ears Throat: lips, mucosa, and tongue normal; teeth and gums normal Neck: no adenopathy, no carotid bruit, supple, symmetrical, trachea midline and thyroid not enlarged, symmetric, no tenderness/mass/nodules Back: symmetric, no curvature. ROM normal. No CVA tenderness. Lungs: clear to auscultation bilaterally Heart: regular rate and rhythm, S1, S2 normal, no murmur, click, rub or gallop Abdomen: soft, non-tender; bowel sounds normal; no masses,  no organomegaly Pulses: 2+ and symmetric Skin: Skin color, texture, turgor normal. No rashes or lesions Lymph nodes: Cervical, supraclavicular, and axillary nodes normal. Neuro: CNs 2-12 intact. DTRs 2+/4 in biceps, brachioradialis, patellars and absent right achilles . Muscle strength 5/5 in upper and left lower extremity.   RIGHT FOOT UNABLE TO DORSIFLEX.   Lab Results  Component Value Date   HGBA1C 6.6 (H) 08/25/2021   HGBA1C 6.4 09/10/2020   HGBA1C 6.2 08/17/2019    Lab Results  Component Value Date   CREATININE 0.75 08/25/2021   CREATININE 0.72 03/13/2021   CREATININE 0.77 02/11/2021    Lab Results  Component Value Date   WBC 6.5 09/10/2020   HGB 11.8 (L) 09/10/2020   HCT 35.5 (L) 09/10/2020   PLT 192.0 09/10/2020   GLUCOSE 112 (H) 08/25/2021   CHOL 167 08/25/2021   TRIG 89 08/25/2021   HDL 72 08/25/2021   LDLDIRECT 137.0 01/06/2015   LDLCALC 78  08/25/2021   ALT 16 08/25/2021   AST 16 08/25/2021   NA 138 08/25/2021   K 4.1 08/25/2021   CL 105 08/25/2021   CREATININE 0.75 08/25/2021   BUN 20 08/25/2021   CO2 26 08/25/2021   TSH 1.18 10/19/2017   HGBA1C 6.6 (H) 08/25/2021   MICROALBUR 1.9 09/10/2020    No results found.  Assessment & Plan:   Problem List Items Addressed This Visit     Hypertension    Well controlled on current regimen. Renal function stable, no changes today.      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 Patient is by no means overweight or sedentary.  She had  reduced her a1c from  6.5 to 5.4 last year on a a low GI diet  But repeat a1c is  higher again due to overindulgence in sweets .  She is taking a statin and ARB.  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  She has decided to undergo corrective surgery and is scheduled for Monday.  Advised to stop taking celebrex today       Relevant Medications   gabapentin (NEURONTIN) 100 MG capsule   Major depressive disorder with current active episode    Aggravated by her facial disfigurement.  No changes to meds today . Surgery on Monday       Foot drop, right    Etiology unclear. Now present for the past month,  Occurred 1 month after receving I/A steroid injection in right hip.  Denies back pain .  Plain films and neurology evaluation advised and ordered .       Relevant Orders   DG Lumbar Spine Complete   Ambulatory referral to Neurology   Right leg pain   Relevant Orders   DG Lumbar Spine Complete   Ambulatory referral to Neurology   Other Visit Diagnoses     Lumbar radiculopathy, right    -  Primary   Relevant Medications   gabapentin (NEURONTIN) 100 MG capsule       I spent a total of 45 minutes with this patient in a  face to face visit on the date of this encounter reviewing the last office visit with me , her recent orthopedic interventions ,  patient'ss diet and eating habits, home blood pressure readings ,  most recent imaging study ,   and post visit ordering of testing and therapeutics.    Follow-up: No follow-ups on file.   Crecencio Mc, MD

## 2021-09-10 NOTE — Assessment & Plan Note (Signed)
Aggravated by her facial disfigurement.  No changes to meds today . Surgery on Monday

## 2021-09-14 DIAGNOSIS — I10 Essential (primary) hypertension: Secondary | ICD-10-CM | POA: Diagnosis not present

## 2021-09-14 DIAGNOSIS — R262 Difficulty in walking, not elsewhere classified: Secondary | ICD-10-CM | POA: Diagnosis not present

## 2021-09-14 DIAGNOSIS — G510 Bell's palsy: Secondary | ICD-10-CM | POA: Diagnosis not present

## 2021-09-14 DIAGNOSIS — H57812 Brow ptosis, left: Secondary | ICD-10-CM | POA: Diagnosis not present

## 2021-09-14 DIAGNOSIS — M950 Acquired deformity of nose: Secondary | ICD-10-CM | POA: Diagnosis not present

## 2021-09-14 DIAGNOSIS — G8100 Flaccid hemiplegia affecting unspecified side: Secondary | ICD-10-CM | POA: Diagnosis not present

## 2021-09-14 DIAGNOSIS — D3610 Benign neoplasm of peripheral nerves and autonomic nervous system, unspecified: Secondary | ICD-10-CM | POA: Diagnosis not present

## 2021-09-14 DIAGNOSIS — Z86018 Personal history of other benign neoplasm: Secondary | ICD-10-CM | POA: Diagnosis not present

## 2021-09-14 DIAGNOSIS — J3489 Other specified disorders of nose and nasal sinuses: Secondary | ICD-10-CM | POA: Diagnosis not present

## 2021-09-14 DIAGNOSIS — E7800 Pure hypercholesterolemia, unspecified: Secondary | ICD-10-CM | POA: Diagnosis not present

## 2021-09-14 DIAGNOSIS — H02402 Unspecified ptosis of left eyelid: Secondary | ICD-10-CM | POA: Diagnosis not present

## 2021-09-15 DIAGNOSIS — M950 Acquired deformity of nose: Secondary | ICD-10-CM | POA: Diagnosis not present

## 2021-09-15 DIAGNOSIS — G8100 Flaccid hemiplegia affecting unspecified side: Secondary | ICD-10-CM | POA: Diagnosis not present

## 2021-09-15 DIAGNOSIS — R262 Difficulty in walking, not elsewhere classified: Secondary | ICD-10-CM | POA: Diagnosis not present

## 2021-09-15 DIAGNOSIS — E7800 Pure hypercholesterolemia, unspecified: Secondary | ICD-10-CM | POA: Diagnosis not present

## 2021-09-15 DIAGNOSIS — H57812 Brow ptosis, left: Secondary | ICD-10-CM | POA: Diagnosis not present

## 2021-09-15 DIAGNOSIS — I10 Essential (primary) hypertension: Secondary | ICD-10-CM | POA: Diagnosis not present

## 2021-09-26 ENCOUNTER — Other Ambulatory Visit: Payer: Self-pay | Admitting: Internal Medicine

## 2021-09-30 ENCOUNTER — Telehealth: Payer: Self-pay | Admitting: Internal Medicine

## 2021-09-30 NOTE — Telephone Encounter (Signed)
Pt called stating she had facial construction at [facility] and they left some of the staples in and pt would like to know if the provider can remove them. Pt would like to be called

## 2021-10-01 NOTE — Telephone Encounter (Signed)
noted 

## 2021-10-01 NOTE — Telephone Encounter (Signed)
LMTCB. Need to advise pt that she should call the place that did her surgery to have them remove the staples since they were supposed to be removed yesterday and we do not have any availability in the office until later next week.

## 2021-10-01 NOTE — Telephone Encounter (Signed)
LMTCB

## 2021-10-01 NOTE — Telephone Encounter (Signed)
No available appts in our office this week.

## 2021-10-01 NOTE — Telephone Encounter (Signed)
Patient states she is returning our call.  Patient states her staples were supposed to be removed yesterday.

## 2021-10-01 NOTE — Telephone Encounter (Signed)
Patient returned office phone call and note was read. Patient will call her surgeon.

## 2021-10-06 ENCOUNTER — Encounter: Payer: Self-pay | Admitting: Internal Medicine

## 2021-10-07 ENCOUNTER — Ambulatory Visit: Payer: Medicare Other | Admitting: Internal Medicine

## 2021-10-07 NOTE — Telephone Encounter (Signed)
Pt called wanting to make an appointment before she sees her neurologist on Thursday but  the provider does not have any available appointments

## 2021-10-08 ENCOUNTER — Ambulatory Visit
Admission: RE | Admit: 2021-10-08 | Discharge: 2021-10-08 | Disposition: A | Discharge status: Home / Self Care | Payer: Medicare Other | Source: Ambulatory Visit | Attending: Internal Medicine | Admitting: Internal Medicine

## 2021-10-08 DIAGNOSIS — M21371 Foot drop, right foot: Secondary | ICD-10-CM | POA: Diagnosis not present

## 2021-10-08 DIAGNOSIS — M79604 Pain in right leg: Secondary | ICD-10-CM | POA: Insufficient documentation

## 2021-10-08 DIAGNOSIS — M5126 Other intervertebral disc displacement, lumbar region: Secondary | ICD-10-CM | POA: Diagnosis not present

## 2021-10-09 IMAGING — CT CT CHEST W/ CM
2 of 4 series · 15 of 36 positions shown, 18 images · IV contrast (omnipaque)
Comparison: CT abdomen pelvis, 03/09/2019

CLINICAL DATA: Follow-up pulmonary nodules

EXAM:
CT CHEST WITH CONTRAST
TECHNIQUE: Multidetector CT imaging of the chest was performed during
intravenous contrast administration.
CONTRAST:  60mL OMNIPAQUE IOHEXOL 300 MG/ML  SOLN

[Series 2: axial chest 2.00 · axial · 0.51mm/px · z∈[-1177,-911]mm · 12 of 159 slices shown, 15 images]
[im 13/159  mediastinal]
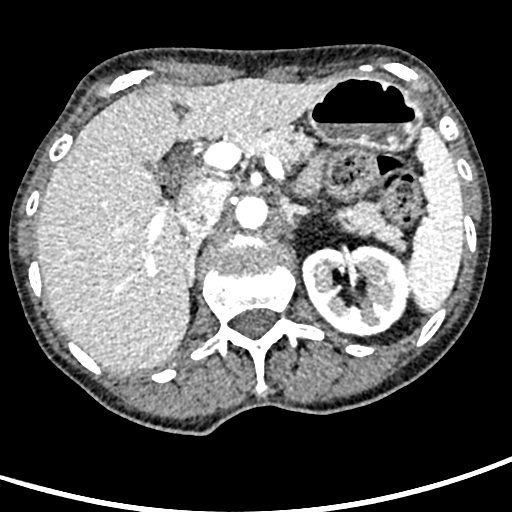
[im 13/159  lung]
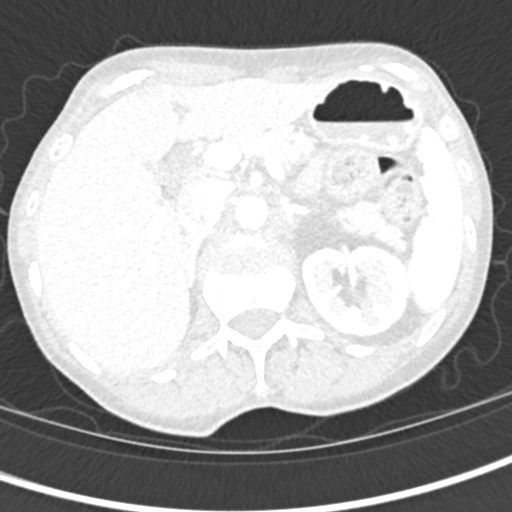
[im 25/159  lung]
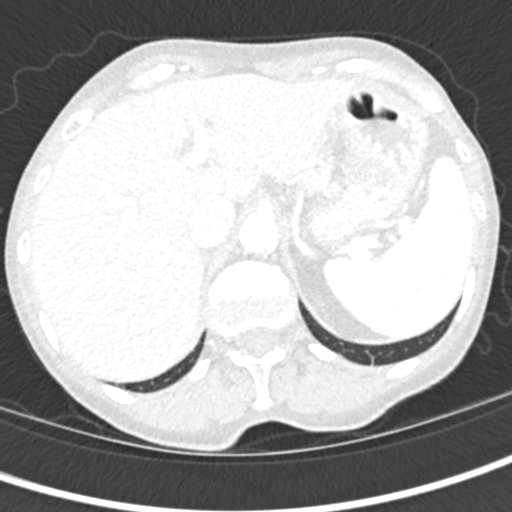
[im 37/159  lung]
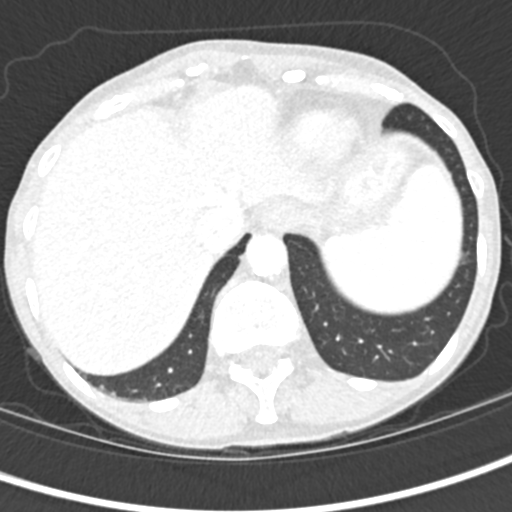
[im 49/159  lung]
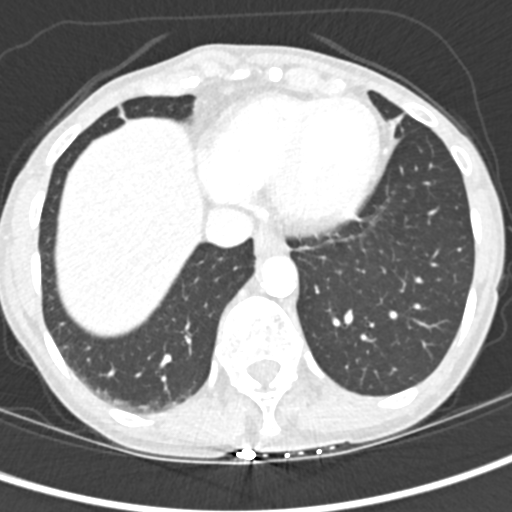
[im 61/159  mediastinal]
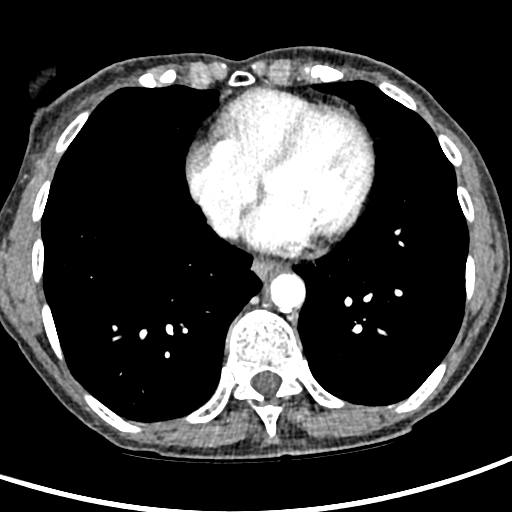
[im 61/159  lung]
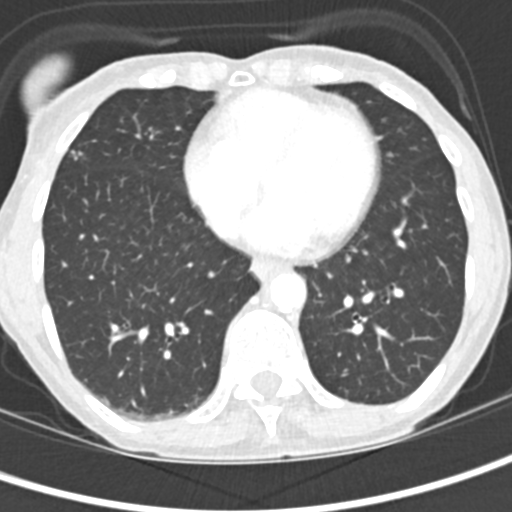
[im 73/159  lung]
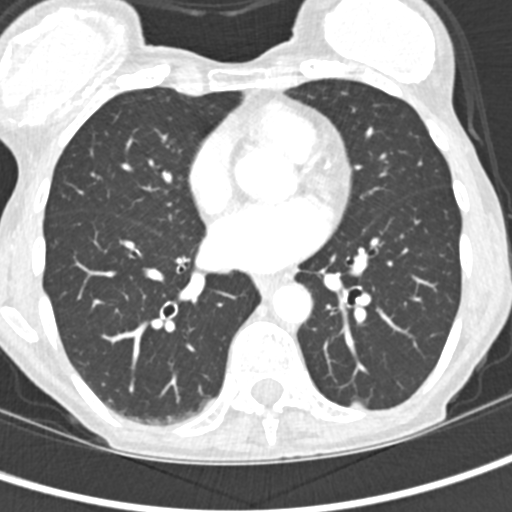
[im 86/159  lung]
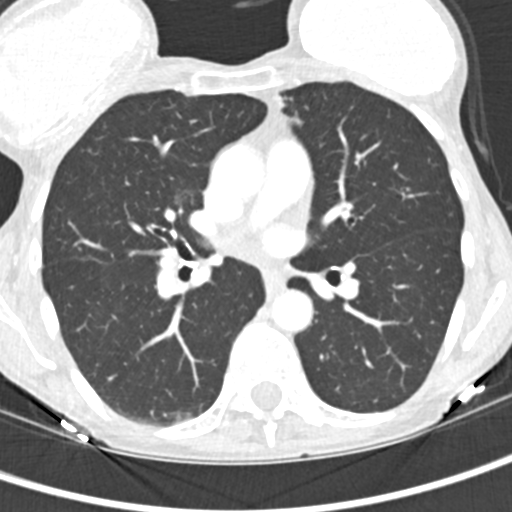
[im 98/159  lung]
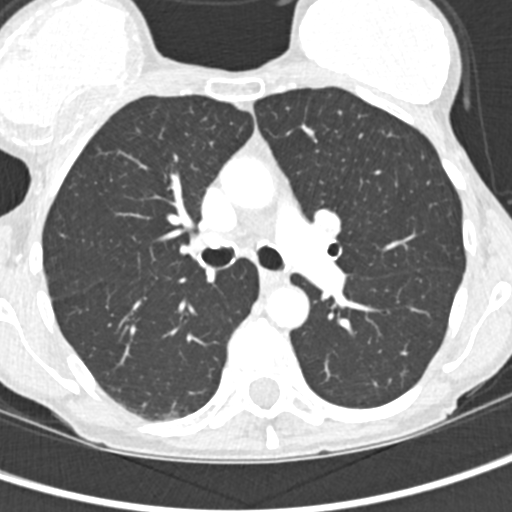
[im 110/159  mediastinal]
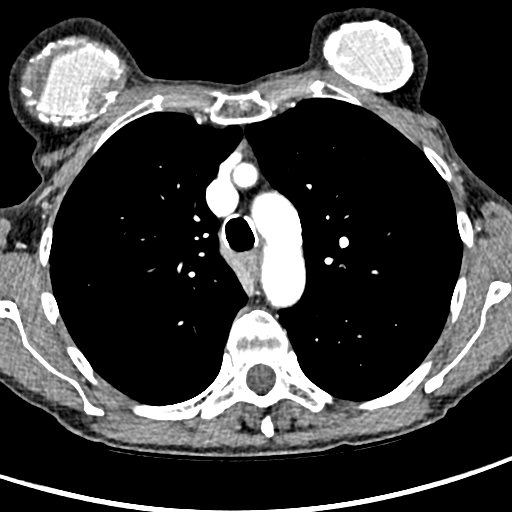
[im 110/159  lung]
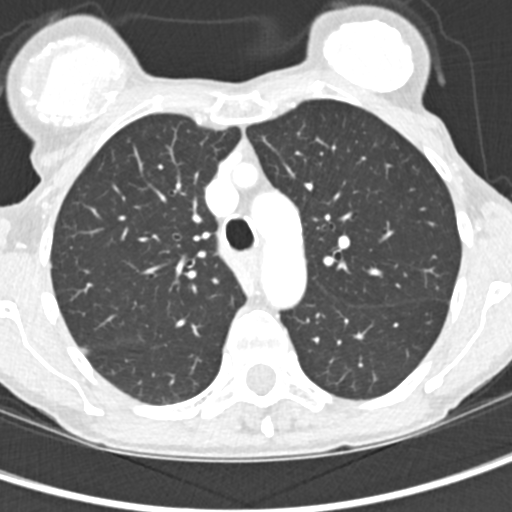
[im 122/159  lung]
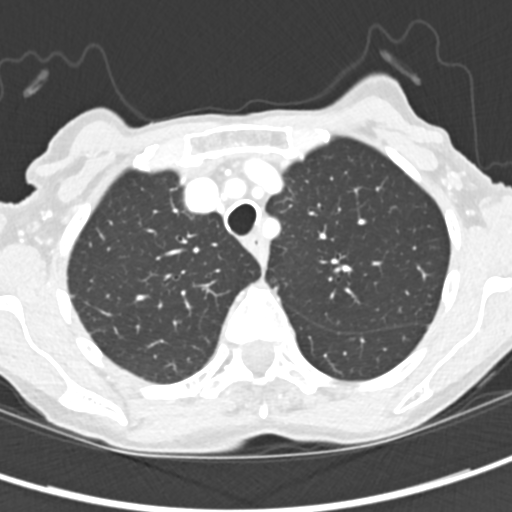
[im 134/159  lung]
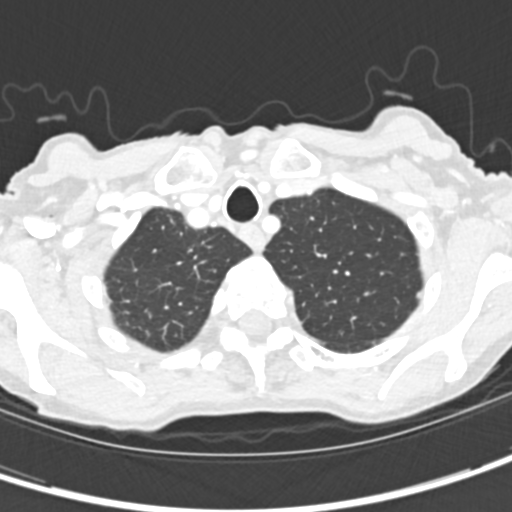
[im 146/159  lung]
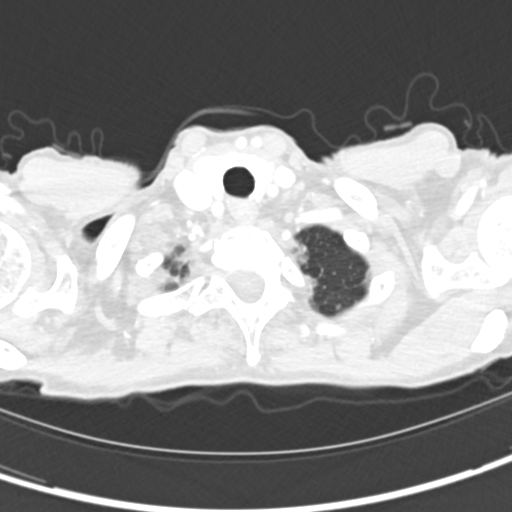

[Series 4: coronal chest 2.00 cor · coronal · 0.51mm/px · 3 of 115 slices shown]
[im 23/115  lung]
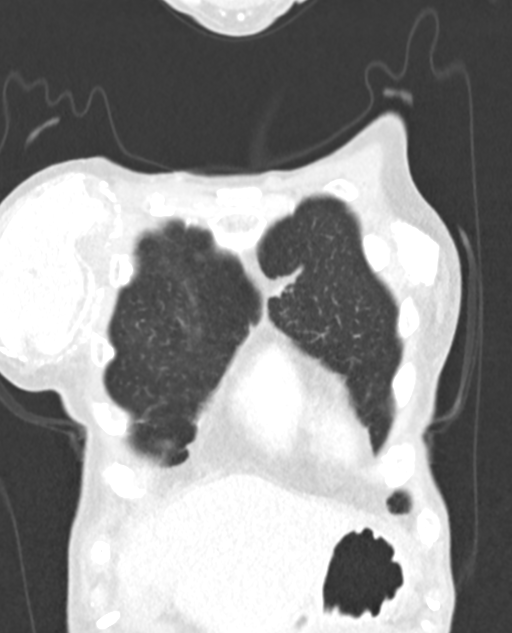
[im 46/115  lung]
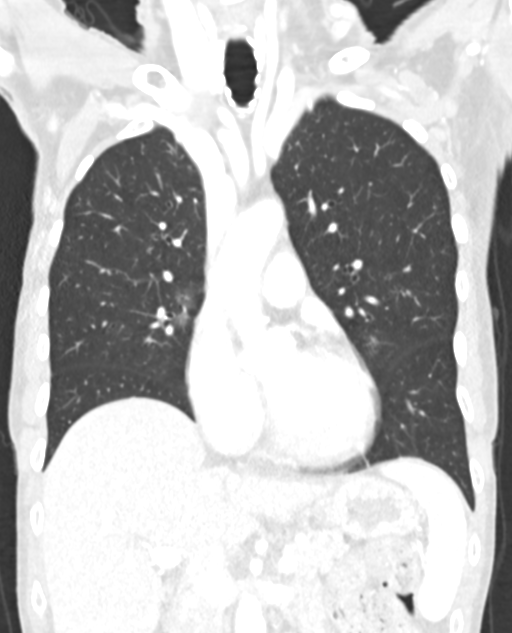
[im 69/115  lung]
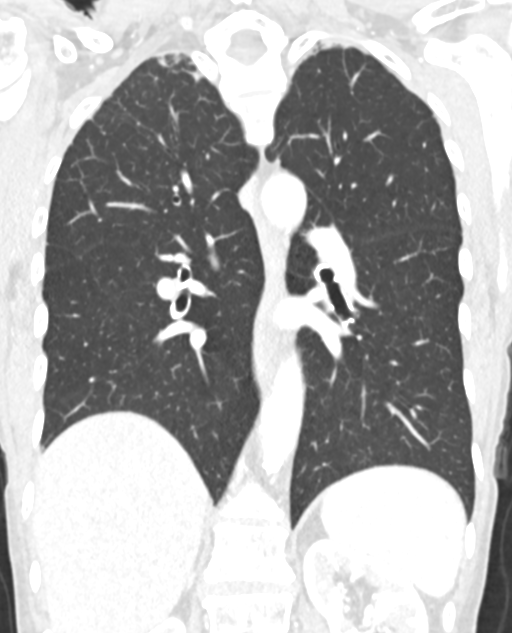

[15 of 36 positions shown; findings below may reference images not displayed]

FINDINGS: Cardiovascular: Aortic atherosclerosis. Normal heart size. Left
coronary artery calcifications. No pericardial effusion.

Mediastinum/Nodes: No enlarged mediastinal, hilar, or axillary lymph
nodes. There is a 9 mm rim calcified nodule of the right lobe of the
thyroid (series 2, image 12) trachea, and esophagus demonstrate no
significant findings.

Lungs/Pleura: Mild biapical pleuroparenchymal scarring. Unchanged
clustered nodularity in the lateral segment right middle lobe
(series 3, image 100). No pleural effusion or pneumothorax.

Upper Abdomen: No acute abnormality.

Musculoskeletal: No chest wall mass or suspicious bone lesions
identified. Calcified bilateral breast implants.
IMPRESSION: 1. Unchanged clustered nodularity in the lateral segment right
middle lobe, consistent with benign sequelae of prior infection or
inflammation, particularly atypical mycobacterial infection. No
further routine CT follow-up is required for these nodules in the
absence of symptoms.
2. Coronary artery disease.  Aortic Atherosclerosis ([id number]).

## 2021-10-10 ENCOUNTER — Other Ambulatory Visit: Payer: Self-pay | Admitting: Internal Medicine

## 2021-10-10 ENCOUNTER — Encounter: Payer: Self-pay | Admitting: Internal Medicine

## 2021-10-10 DIAGNOSIS — D649 Anemia, unspecified: Secondary | ICD-10-CM

## 2021-10-10 DIAGNOSIS — M21371 Foot drop, right foot: Secondary | ICD-10-CM

## 2021-10-10 DIAGNOSIS — M48061 Spinal stenosis, lumbar region without neurogenic claudication: Secondary | ICD-10-CM

## 2021-10-10 DIAGNOSIS — M48062 Spinal stenosis, lumbar region with neurogenic claudication: Secondary | ICD-10-CM | POA: Insufficient documentation

## 2021-10-15 DIAGNOSIS — R200 Anesthesia of skin: Secondary | ICD-10-CM | POA: Diagnosis not present

## 2021-10-15 DIAGNOSIS — R202 Paresthesia of skin: Secondary | ICD-10-CM | POA: Diagnosis not present

## 2021-10-15 DIAGNOSIS — F411 Generalized anxiety disorder: Secondary | ICD-10-CM | POA: Diagnosis not present

## 2021-10-15 DIAGNOSIS — G510 Bell's palsy: Secondary | ICD-10-CM | POA: Diagnosis not present

## 2021-10-15 DIAGNOSIS — G5712 Meralgia paresthetica, left lower limb: Secondary | ICD-10-CM | POA: Diagnosis not present

## 2021-10-15 DIAGNOSIS — D3610 Benign neoplasm of peripheral nerves and autonomic nervous system, unspecified: Secondary | ICD-10-CM | POA: Diagnosis not present

## 2021-10-20 ENCOUNTER — Ambulatory Visit (INDEPENDENT_AMBULATORY_CARE_PROVIDER_SITE_OTHER): Payer: Medicare Other

## 2021-10-20 DIAGNOSIS — D649 Anemia, unspecified: Secondary | ICD-10-CM

## 2021-10-20 DIAGNOSIS — M810 Age-related osteoporosis without current pathological fracture: Secondary | ICD-10-CM | POA: Diagnosis not present

## 2021-10-20 MED ORDER — DENOSUMAB 60 MG/ML ~~LOC~~ SOSY
60.0000 mg | PREFILLED_SYRINGE | Freq: Once | SUBCUTANEOUS | Status: AC
  Administered 2021-10-20: 60 mg via SUBCUTANEOUS

## 2021-10-20 NOTE — Progress Notes (Signed)
Patient came in today for Prolia injection given in left arm SQ. Patient tolerated well with no signs of distress.  °

## 2021-10-21 LAB — CBC WITH DIFFERENTIAL/PLATELET
Basophils Absolute: 0.1 K/uL (ref 0.0–0.1)
Basophils Relative: 1.2 % (ref 0.0–3.0)
Eosinophils Absolute: 0.1 K/uL (ref 0.0–0.7)
Eosinophils Relative: 1.1 % (ref 0.0–5.0)
HCT: 31.6 % — ABNORMAL LOW (ref 36.0–46.0)
Hemoglobin: 10.6 g/dL — ABNORMAL LOW (ref 12.0–15.0)
Lymphocytes Relative: 26.5 % (ref 12.0–46.0)
Lymphs Abs: 1.7 K/uL (ref 0.7–4.0)
MCHC: 33.6 g/dL (ref 30.0–36.0)
MCV: 91.1 fl (ref 78.0–100.0)
Monocytes Absolute: 0.5 K/uL (ref 0.1–1.0)
Monocytes Relative: 7.9 % (ref 3.0–12.0)
Neutro Abs: 4.1 K/uL (ref 1.4–7.7)
Neutrophils Relative %: 63.3 % (ref 43.0–77.0)
Platelets: 213 K/uL (ref 150.0–400.0)
RBC: 3.47 Mil/uL — ABNORMAL LOW (ref 3.87–5.11)
RDW: 14.1 % (ref 11.5–15.5)
WBC: 6.5 K/uL (ref 4.0–10.5)

## 2021-10-21 LAB — B12 AND FOLATE PANEL
Folate: 12.9 ng/mL (ref >5.9)
Vitamin B-12: 347 pg/mL (ref 211–911)

## 2021-10-21 LAB — IFE AND PE, RANDOM URINE

## 2021-10-21 LAB — IBC + FERRITIN
Ferritin: 47.2 ng/mL (ref 10.0–291.0)
Iron: 75 ug/dL (ref 42–145)
Saturation Ratios: 21.2 % (ref 20.0–50.0)
TIBC: 354.2 ug/dL (ref 250.0–450.0)
Transferrin: 253 mg/dL (ref 212.0–360.0)

## 2021-10-23 LAB — PROTEIN ELECTROPHORESIS, SERUM
Albumin ELP: 3.9 g/dL (ref 3.8–4.8)
Alpha 1: 0.3 g/dL (ref 0.2–0.3)
Alpha 2: 0.8 g/dL (ref 0.5–0.9)
Beta 2: 0.3 g/dL (ref 0.2–0.5)
Beta Globulin: 0.4 g/dL (ref 0.4–0.6)
Gamma Globulin: 0.8 g/dL (ref 0.8–1.7)
Total Protein: 6.5 g/dL (ref 6.1–8.1)

## 2021-10-23 NOTE — Addendum Note (Signed)
Addended by: Crecencio Mc on: 10/23/2021 11:48 AM   Modules accepted: Orders

## 2021-10-28 ENCOUNTER — Encounter: Payer: Self-pay | Admitting: Oncology

## 2021-10-28 ENCOUNTER — Inpatient Hospital Stay: Payer: Medicare Other | Attending: Oncology | Admitting: Oncology

## 2021-10-28 ENCOUNTER — Inpatient Hospital Stay: Payer: Medicare Other

## 2021-10-28 VITALS — BP 143/68 | HR 79 | Temp 98.9°F | Resp 18 | Wt 108.2 lb

## 2021-10-28 DIAGNOSIS — E785 Hyperlipidemia, unspecified: Secondary | ICD-10-CM | POA: Diagnosis not present

## 2021-10-28 DIAGNOSIS — Z7982 Long term (current) use of aspirin: Secondary | ICD-10-CM | POA: Insufficient documentation

## 2021-10-28 DIAGNOSIS — Z791 Long term (current) use of non-steroidal anti-inflammatories (NSAID): Secondary | ICD-10-CM | POA: Insufficient documentation

## 2021-10-28 DIAGNOSIS — R600 Localized edema: Secondary | ICD-10-CM | POA: Diagnosis not present

## 2021-10-28 DIAGNOSIS — G8929 Other chronic pain: Secondary | ICD-10-CM | POA: Diagnosis not present

## 2021-10-28 DIAGNOSIS — M5136 Other intervertebral disc degeneration, lumbar region: Secondary | ICD-10-CM | POA: Insufficient documentation

## 2021-10-28 DIAGNOSIS — Z79899 Other long term (current) drug therapy: Secondary | ICD-10-CM | POA: Diagnosis not present

## 2021-10-28 DIAGNOSIS — Z86018 Personal history of other benign neoplasm: Secondary | ICD-10-CM | POA: Diagnosis not present

## 2021-10-28 DIAGNOSIS — Q8502 Neurofibromatosis, type 2: Secondary | ICD-10-CM | POA: Diagnosis not present

## 2021-10-28 DIAGNOSIS — H0223B Paralytic lagophthalmos left eye, upper and lower eyelids: Secondary | ICD-10-CM | POA: Insufficient documentation

## 2021-10-28 DIAGNOSIS — G510 Bell's palsy: Secondary | ICD-10-CM | POA: Insufficient documentation

## 2021-10-28 DIAGNOSIS — M7989 Other specified soft tissue disorders: Secondary | ICD-10-CM | POA: Insufficient documentation

## 2021-10-28 DIAGNOSIS — Z789 Other specified health status: Secondary | ICD-10-CM | POA: Insufficient documentation

## 2021-10-28 DIAGNOSIS — D649 Anemia, unspecified: Secondary | ICD-10-CM | POA: Insufficient documentation

## 2021-10-28 DIAGNOSIS — Z85820 Personal history of malignant melanoma of skin: Secondary | ICD-10-CM | POA: Insufficient documentation

## 2021-10-28 DIAGNOSIS — I10 Essential (primary) hypertension: Secondary | ICD-10-CM | POA: Insufficient documentation

## 2021-10-28 LAB — LACTATE DEHYDROGENASE: LDH: 168 U/L (ref 98–192)

## 2021-10-28 LAB — CBC WITH DIFFERENTIAL/PLATELET
Abs Immature Granulocytes: 0.02 10*3/uL (ref 0.00–0.07)
Basophils Absolute: 0.1 10*3/uL (ref 0.0–0.1)
Basophils Relative: 1 %
Eosinophils Absolute: 0.1 10*3/uL (ref 0.0–0.5)
Eosinophils Relative: 1 %
HCT: 34.9 % — ABNORMAL LOW (ref 36.0–46.0)
Hemoglobin: 11.1 g/dL — ABNORMAL LOW (ref 12.0–15.0)
Immature Granulocytes: 0 %
Lymphocytes Relative: 21 %
Lymphs Abs: 1.4 10*3/uL (ref 0.7–4.0)
MCH: 29.8 pg (ref 26.0–34.0)
MCHC: 31.8 g/dL (ref 30.0–36.0)
MCV: 93.6 fL (ref 80.0–100.0)
Monocytes Absolute: 0.4 10*3/uL (ref 0.1–1.0)
Monocytes Relative: 6 %
Neutro Abs: 4.7 10*3/uL (ref 1.7–7.7)
Neutrophils Relative %: 71 %
Platelets: 211 10*3/uL (ref 150–400)
RBC: 3.73 MIL/uL — ABNORMAL LOW (ref 3.87–5.11)
RDW: 13.4 % (ref 11.5–15.5)
WBC: 6.7 10*3/uL (ref 4.0–10.5)
nRBC: 0 % (ref 0.0–0.2)

## 2021-10-28 LAB — COMPREHENSIVE METABOLIC PANEL
ALT: 17 U/L (ref 0–44)
AST: 18 U/L (ref 15–41)
Albumin: 4.2 g/dL (ref 3.5–5.0)
Alkaline Phosphatase: 50 U/L (ref 38–126)
Anion gap: 8 (ref 5–15)
BUN: 31 mg/dL — ABNORMAL HIGH (ref 8–23)
CO2: 25 mmol/L (ref 22–32)
Calcium: 8.5 mg/dL — ABNORMAL LOW (ref 8.9–10.3)
Chloride: 104 mmol/L (ref 98–111)
Creatinine, Ser: 0.71 mg/dL (ref 0.44–1.00)
GFR, Estimated: >60 mL/min (ref >60)
Glucose, Bld: 105 mg/dL — ABNORMAL HIGH (ref 70–99)
Potassium: 4.4 mmol/L (ref 3.5–5.1)
Sodium: 137 mmol/L (ref 135–145)
Total Bilirubin: 0.7 mg/dL (ref 0.3–1.2)
Total Protein: 7.1 g/dL (ref 6.5–8.1)

## 2021-10-28 LAB — RETIC PANEL
Immature Retic Fract: 4.4 % (ref 2.3–15.9)
RBC.: 3.73 MIL/uL — ABNORMAL LOW (ref 3.87–5.11)
Retic Count, Absolute: 37.7 10*3/uL (ref 19.0–186.0)
Retic Ct Pct: 1 % (ref 0.4–3.1)
Reticulocyte Hemoglobin: 33.2 pg (ref >27.9)

## 2021-10-28 LAB — TECHNOLOGIST SMEAR REVIEW: Plt Morphology: NORMAL

## 2021-10-28 LAB — FERRITIN: Ferritin: 40 ng/mL (ref 11–307)

## 2021-10-28 LAB — IRON AND TIBC
Iron: 89 ug/dL (ref 28–170)
Saturation Ratios: 23 % (ref 10.4–31.8)
TIBC: 386 ug/dL (ref 250–450)
UIBC: 297 ug/dL

## 2021-10-28 NOTE — Assessment & Plan Note (Signed)
Check CBC, smear, CMP, multiple myeloma panel, ulceration, iron TIBC ferritin, smear, LDH, reticulocyte panel Discussed about possibility of bone marrow biopsy if above work-up results were negative and patient has progressive decrease of hemoglobin levels.  She agrees with the plan.

## 2021-10-28 NOTE — Assessment & Plan Note (Signed)
Check right lower extremity ultrasound

## 2021-10-28 NOTE — Assessment & Plan Note (Signed)
Recommend patient to cut back on alcohol use.  Alcohol potentially contributes to bone marrow suppression.

## 2021-10-28 NOTE — Progress Notes (Signed)
Patient here today to establish care.

## 2021-10-28 NOTE — Progress Notes (Addendum)
Hematology/Oncology Consult note [phone] [phone]      Patient Care Team: Crecencio Mc, MD as PCP - General (Internal Medicine)   REFERRING PROVIDER: Crecencio Mc, MD  CHIEF COMPLAINTS/REASON FOR VISIT:  Anemia  ASSESSMENT & PLAN:  Normocytic anemia Check CBC, smear, CMP, multiple myeloma panel, ulceration, iron TIBC ferritin, smear, LDH, reticulocyte panel Discussed about possibility of bone marrow biopsy if above work-up results were negative and patient has progressive decrease of hemoglobin levels.  She agrees with the plan.  Alcohol use Recommend patient to cut back on alcohol use.  Alcohol potentially contributes to bone marrow suppression.  Right leg swelling Check right lower extremity ultrasound  Orders Placed This Encounter  Procedures   US Venous Img Lower Unilateral Right    Standing Status:   Future    Standing Expiration Date:   10/28/2022    Order Specific Question:   Reason for Exam (SYMPTOM  OR DIAGNOSIS REQUIRED)    Answer:   right lower leg swelling    Order Specific Question:   Preferred imaging location?    Answer:   [facility]   Kappa/lambda light chains    Standing Status:   Future    Number of Occurrences:   1    Standing Expiration Date:   10/29/2022   Multiple Myeloma Panel (SPEP&IFE w/QIG)    Standing Status:   Future    Number of Occurrences:   1    Standing Expiration Date:   10/29/2022   Ferritin    Standing Status:   Future    Number of Occurrences:   1    Standing Expiration Date:   04/28/2022   Iron and TIBC    Standing Status:   Future    Number of Occurrences:   1    Standing Expiration Date:   10/29/2022   Comprehensive metabolic panel    Standing Status:   Future    Number of Occurrences:   1    Standing Expiration Date:   10/29/2022   CBC with Differential/Platelet    Standing Status:   Future    Number of Occurrences:   1    Standing Expiration Date:   10/29/2022   Retic Panel     Standing Status:   Future    Number of Occurrences:   1    Standing Expiration Date:   10/29/2022   Lactate dehydrogenase    Standing Status:   Future    Number of Occurrences:   1    Standing Expiration Date:   10/29/2022   Technologist smear review    Standing Status:   Future    Number of Occurrences:   1    Standing Expiration Date:   10/29/2022    Order Specific Question:   Clinical information:    Answer:   anemia    All questions were answered. The patient knows to call the clinic with any problems, questions or concerns.  [name], MD, PhD [facility] Hematology Oncology 10/28/2021     HISTORY OF PRESENTING ILLNESS:  Rita Taylor is a  81 y.o.  female with PMH listed below who was referred to me for anemia Reviewed patient's recent labs that was done.  She was found to have abnormal CBC on 10/20/2021 with a hemoglobin 10.6.  Hematocrit 31.6.  Patient had a normal folate, vitamin B12, iron panel, protein after pheresis negative.  Urine protein electrophoresis immunofixation negative. Reviewed patient's previous labs ordered by primary  care physician's office, anemia is chronic onset , duration is since January 2022. No aggravating or improving factors.  She denies recent chest pain on exertion, shortness of breath on minimal exertion, pre-syncopal episodes, or palpitations She had not noticed any recent bleeding such as epistaxis, hematuria or hematochezia.  She denies over the counter NSAID ingestion.  Patient drinks a glass of wine daily at lunch. 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  + Patient has noticed right extremity swelling for about 2 months. 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  Diffusely heterogeneous bone marrow  signal which is nonspecific.   MEDICAL HISTORY:  Past Medical History:  Diagnosis Date   History of shingles    Hyperlipidemia    Hypertension    Melanoma ([place]) 2008   removed by Koleen Nimrod, Wider excision by Tamala Julian left flank   Neurofibromatosis type II [facility])    Postoperative anemia 07/22/2018   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([place])     SURGICAL HISTORY: Past Surgical History:  Procedure Laterality Date   bitubal ligation  1981   BREAST ENHANCEMENT SURGERY  1990   BROW LIFT Left 01/24/2020   Procedure: TARSORRHAPHY, LATERAL PLACEMENT LEFT LOWER LID;  Surgeon: Karle Starch, MD;  Location: [place];  Service: Ophthalmology;  Laterality: Left;   COLONOSCOPY     ECTROPION REPAIR Left 03/02/2019   Procedure: ECTROPION REPAIR, EXTENSIVE AND TARSORRHAPHY, LATERAL PLACEMENT OF LEFT LOWER LID;  Surgeon: Karle Starch, MD;  Location: [place];  Service: Ophthalmology;  Laterality: Left;   FACIAL COSMETIC SURGERY     GYNECOLOGIC CRYOSURGERY     15 years ago, normal since then, was treated with antibiotics, Annual pap smears for the past 20 years   TUMOR EXCISION Left 06/2018   ear    SOCIAL HISTORY: Social History   Socioeconomic History   Marital status: Married    Spouse name: Not on file   Number of children: Not on file   Years of education: Not on file   Highest education level: Not on file  Occupational History   Not on file  Tobacco Use   Smoking status: Never   Smokeless tobacco: Never  Vaping Use   Vaping Use: Never used  Substance and Sexual Activity   Alcohol use: Yes    Alcohol/week: 1.0 standard drink of alcohol    Types: 1 Glasses of wine per week    Comment: moderate half a glass daily   Drug use: No   Sexual activity: Yes  Other Topics Concern   Not on file  Social History Narrative   Lives with spouse.   Has 2 dogs and one cat, sold a horse farm years ago.   Exercises regularly 5 days a week at [employer] and also walks the dog   Social  Determinants of Health   Financial Resource Strain: Low Risk  (05/25/2021)   Overall Financial Resource Strain (CARDIA)    Difficulty of Paying Living Expenses: Not hard at all  Food Insecurity: No Food Insecurity (05/25/2021)   Hunger Vital Sign    Worried About Running Out of Food in the Last Year: Never true    Ran Out of Food in the Last Year: Never true  Transportation Needs: No Transportation Needs (05/25/2021)   PRAPARE - Hydrologist (  Medical): No    Lack of Transportation (Non-Medical): No  Physical Activity: Not on file  Stress: No Stress Concern Present (05/25/2021)   [place]    Feeling of Stress : Not at all  Social Connections: Unknown (05/25/2021)   Social Connection and Isolation Panel [NHANES]    Frequency of Communication with Friends and Family: Not on file    Frequency of Social Gatherings with Friends and Family: Not on file    Attends Religious Services: Not on file    Active Member of Clubs or Organizations: Not on file    Attends Archivist Meetings: Not on file    Marital Status: Married  Intimate Partner Violence: Not At Risk (05/25/2021)   Humiliation, Afraid, Rape, and Kick questionnaire    Fear of Current or Ex-Partner: No    Emotionally Abused: No    Physically Abused: No    Sexually Abused: No    FAMILY HISTORY: Family History  Problem Relation Age of Onset   Hypertension Mother    High Cholesterol Mother    High Cholesterol Father    Hypertension Father    Hypertension Brother     ALLERGIES:  is allergic to fosamax [alendronate], pseudoephedrine-dm-gg, and risedronate sodium.  MEDICATIONS:  Current Outpatient Medications  Medication Sig Dispense Refill   amLODipine (NORVASC) 5 MG tablet TAKE 1 TABLET BY MOUTH DAILY 90 tablet 1   ASPIRIN 81 PO Take by mouth daily.     Biotin w/ Vitamins C & E (HAIR/SKIN/NAILS PO) Take by mouth daily.      buPROPion (WELLBUTRIN XL) 150 MG 24 hr tablet TAKE 1 TABLET(150 MG) BY MOUTH DAILY 30 tablet 2   Calcium Carbonate-Vit D-Min (CALCIUM 1200) 1200-1000 MG-UNIT CHEW Chew 1 tablet by mouth daily.     celecoxib (CELEBREX) 100 MG capsule TAKE 1 CAPSULE(100 MG) BY MOUTH TWICE DAILY 90 capsule 1   Cholecalciferol (VITAMIN D-3) 25 MCG (1000 UT) CAPS Take by mouth.     denosumab (PROLIA) 60 MG/ML SOSY injection Inject into the skin.     erythromycin ophthalmic ointment Apply to sutures 4 times a day for 10-12 days.  Discontinue if allergy develops and call our office 3.5 g 2   gabapentin (NEURONTIN) 100 MG capsule Take 1 capsule (100 mg total) by mouth 3 (three) times daily. 90 capsule 3   losartan (COZAAR) 100 MG tablet TAKE 1 TABLET(100 MG) BY MOUTH DAILY 90 tablet 1   pravastatin (PRAVACHOL) 40 MG tablet TAKE 1 TABLET(40 MG) BY MOUTH DAILY 90 tablet 1   No current facility-administered medications for this visit.    Review of Systems  Constitutional:  Negative for appetite change, chills, fatigue and fever.  HENT:   Positive for hearing loss. Negative for voice change.   Eyes:  Negative for eye problems.  Respiratory:  Negative for chest tightness and cough.   Cardiovascular:  Positive for leg swelling. Negative for chest pain.  Gastrointestinal:  Negative for abdominal distention, abdominal pain and blood in stool.  Endocrine: Negative for hot flashes.  Genitourinary:  Negative for difficulty urinating and frequency.   Musculoskeletal:  Negative for arthralgias.  Skin:  Negative for itching and rash.  Neurological:  Negative for extremity weakness.       Facial paralysis Foot drop  Hematological:  Negative for adenopathy.  Psychiatric/Behavioral:  Negative for confusion.     PHYSICAL EXAMINATION: ECOG PERFORMANCE STATUS: 1 - Symptomatic but completely ambulatory Vitals:   10/28/21  1055  BP: (!) 143/68  Pulse: 79  Resp: 18  Temp: 98.9 F (37.2 C)   Filed Weights   10/28/21 1055   Weight: 108 lb 3.2 oz (49.1 kg)    Physical Exam Constitutional:      General: She is not in acute distress. HENT:     Head: Normocephalic and atraumatic.  Eyes:     General: No scleral icterus. Cardiovascular:     Rate and Rhythm: Normal rate and regular rhythm.     Heart sounds: Normal heart sounds.  Pulmonary:     Effort: Pulmonary effort is normal. No respiratory distress.     Breath sounds: No wheezing.  Abdominal:     General: Bowel sounds are normal. There is no distension.     Palpations: Abdomen is soft.  Musculoskeletal:        General: No deformity. Normal range of motion.     Cervical back: Normal range of motion and neck supple.     Right lower leg: Edema present.  Skin:    General: Skin is warm and dry.  Neurological:     Mental Status: She is alert and oriented to person, place, and time. Mental status is at baseline.     Comments:  Left lagophthalmos Facial paralysis  Psychiatric:        Mood and Affect: Mood normal.      LABORATORY DATA:  I have reviewed the data as listed    Latest Ref Rng & Units 10/28/2021   11:43 AM 10/20/2021    2:33 PM 09/10/2020    9:42 AM  CBC  WBC 4.0 - 10.5 K/uL 6.7  6.5  6.5   Hemoglobin 12.0 - 15.0 g/dL 11.1  10.6  11.8   Hematocrit 36.0 - 46.0 % 34.9  31.6  35.5   Platelets 150 - 400 K/uL 211  213.0  192.0       Latest Ref Rng & Units 10/28/2021   11:43 AM 10/20/2021    2:33 PM 08/25/2021    7:33 AM  CMP  Glucose 70 - 99 mg/dL 105   112   BUN 8 - 23 mg/dL 31   20   Creatinine 0.44 - 1.00 mg/dL 0.71   0.75   Sodium 135 - 145 mmol/L 137   138   Potassium 3.5 - 5.1 mmol/L 4.4   4.1   Chloride 98 - 111 mmol/L 104   105   CO2 22 - 32 mmol/L 25   26   Calcium 8.9 - 10.3 mg/dL 8.5   8.9   Total Protein 6.5 - 8.1 g/dL 7.1  6.5  6.5   Total Bilirubin 0.3 - 1.2 mg/dL 0.7   0.6   Alkaline Phos 38 - 126 U/L 50   45   AST 15 - 41 U/L 18   16   ALT 0 - 44 U/L 17   16       Component Value Date/Time   IRON 89 10/28/2021  1143   TIBC 386 10/28/2021 1143   FERRITIN 40 10/28/2021 1143   IRONPCTSAT 23 10/28/2021 1143     RADIOGRAPHIC STUDIES: I have personally reviewed the radiological images as listed and agreed with the findings in the report. MR Lumbar Spine Wo Contrast  Result Date: 10/09/2021 CLINICAL DATA:  Lumbar radiculopathy. Bilateral foot drop and leg numbness. EXAM: MRI LUMBAR SPINE WITHOUT CONTRAST TECHNIQUE: Multiplanar, multisequence MR imaging of the lumbar spine was performed. No intravenous contrast was administered. COMPARISON:  Lumbar spine radiographs 09/10/2021 FINDINGS: Segmentation:  Standard. Alignment: Mild lumbar dextroscoliosis. Trace retrolisthesis of L2 on L3 and trace anterolisthesis of L4 on L5. Vertebrae: No fracture or significant marrow edema. Diffusely heterogeneous bone marrow signal with patchy T1 hypointensity throughout the lumbar spine. Conus medullaris and cauda equina: Conus extends to the upper L1 level. Conus and cauda equina appear normal. Paraspinal and other soft tissues: Small left renal cysts measuring up to 1 cm with no follow-up imaging recommended. Disc levels: Disc desiccation throughout the lumbar spine with only at most mild disc space narrowing. T12-L1: Negative. L1-2: Mild disc bulging and mild facet hypertrophy without stenosis. L2-3: Disc bulging and moderate left greater than right facet and ligamentum flavum hypertrophy result in mild left greater than right lateral recess and neural foraminal stenosis without spinal stenosis. L3-4: Disc bulging, a left foraminal disc protrusion, and moderate facet and ligamentum flavum hypertrophy result in mild-to-moderate spinal stenosis, moderate bilateral lateral recess stenosis, and mild right and moderate left neural foraminal stenosis. Potential left L3 and bilateral L4 nerve root impingement. L4-5: Anterolisthesis with bulging uncovered disc and severe facet and ligamentum flavum hypertrophy result in moderate spinal  stenosis, moderate bilateral lateral recess stenosis, and mild right and moderate left neural foraminal stenosis. Potential left L4 and bilateral L5 nerve root impingement. L5-S1: Disc bulging and mild facet hypertrophy result in mild right greater than left lateral recess stenosis and mild-to-moderate right and mild left neural foraminal stenosis without spinal stenosis. IMPRESSION: 1. Multilevel lumbar disc and facet degeneration, most notable at L4-5 where there is severe facet arthrosis with grade 1 anterolisthesis, moderate spinal stenosis, and moderate left neural foraminal stenosis. 2. Mild-to-moderate spinal and moderate lateral recess stenosis at L3-4. 3. Mild-to-moderate neural foraminal stenosis at L3-4 and L5-S1. 4. Diffusely heterogeneous bone marrow signal which is nonspecific, however malignant/infiltrative processes can have this appearance in addition to benign processes (anemia, advancing age, and smoking). Correlate with CBC, and if the patient has a history of malignancy, consider obtaining a nuclear medicine whole-body bone scan for further evaluation. Laboratory evaluation for multiple myeloma could also be considered. Electronically Signed   By: Logan Bores M.D.   On: 10/09/2021 12:55

## 2021-10-29 LAB — KAPPA/LAMBDA LIGHT CHAINS
Kappa free light chain: 20.3 mg/L — ABNORMAL HIGH (ref 3.3–19.4)
Kappa, lambda light chain ratio: 2.14 — ABNORMAL HIGH (ref 0.26–1.65)
Lambda free light chains: 9.5 mg/L (ref 5.7–26.3)

## 2021-11-03 LAB — MULTIPLE MYELOMA PANEL, SERUM
Albumin SerPl Elph-Mcnc: 3.7 g/dL (ref 2.9–4.4)
Albumin/Glob SerPl: 1.4 (ref 0.7–1.7)
Alpha 1: 0.3 g/dL (ref 0.0–0.4)
Alpha2 Glob SerPl Elph-Mcnc: 0.7 g/dL (ref 0.4–1.0)
B-Globulin SerPl Elph-Mcnc: 0.9 g/dL (ref 0.7–1.3)
Gamma Glob SerPl Elph-Mcnc: 0.8 g/dL (ref 0.4–1.8)
Globulin, Total: 2.7 g/dL (ref 2.2–3.9)
IgA: 47 mg/dL — ABNORMAL LOW (ref 64–422)
IgG (Immunoglobin G), Serum: 842 mg/dL (ref 586–1602)
IgM (Immunoglobulin M), Srm: 45 mg/dL (ref 26–217)
Total Protein ELP: 6.4 g/dL (ref 6.0–8.5)

## 2021-11-04 ENCOUNTER — Ambulatory Visit
Admission: RE | Admit: 2021-11-04 | Discharge: 2021-11-04 | Disposition: A | Discharge status: Home / Self Care | Payer: Medicare Other | Source: Ambulatory Visit | Attending: Oncology | Admitting: Oncology

## 2021-11-04 DIAGNOSIS — M7989 Other specified soft tissue disorders: Secondary | ICD-10-CM | POA: Diagnosis not present

## 2021-11-10 ENCOUNTER — Other Ambulatory Visit: Payer: Self-pay | Admitting: Internal Medicine

## 2021-11-10 DIAGNOSIS — I10 Essential (primary) hypertension: Secondary | ICD-10-CM

## 2021-11-15 ENCOUNTER — Telehealth: Payer: Self-pay | Admitting: *Deleted

## 2021-11-18 DIAGNOSIS — Z23 Encounter for immunization: Secondary | ICD-10-CM | POA: Diagnosis not present

## 2021-11-24 ENCOUNTER — Telehealth: Payer: Self-pay | Admitting: Internal Medicine

## 2021-11-24 NOTE — Telephone Encounter (Signed)
Let Patient know that you do recommend a RSV vaccine for her and if she needs a RX you will send one. Patient states she will call and check with her pharmacy and let us know.

## 2021-11-24 NOTE — Telephone Encounter (Signed)
Pt called wanting to know if she should get the RSV vaccine

## 2021-11-25 ENCOUNTER — Inpatient Hospital Stay: Payer: Medicare Other | Attending: Oncology | Admitting: Oncology

## 2021-11-25 ENCOUNTER — Encounter: Payer: Self-pay | Admitting: Oncology

## 2021-11-25 VITALS — BP 141/62 | HR 77 | Resp 18 | Ht 62.5 in | Wt 110.0 lb

## 2021-11-25 DIAGNOSIS — M7989 Other specified soft tissue disorders: Secondary | ICD-10-CM | POA: Diagnosis not present

## 2021-11-25 DIAGNOSIS — Z7982 Long term (current) use of aspirin: Secondary | ICD-10-CM | POA: Diagnosis not present

## 2021-11-25 DIAGNOSIS — I10 Essential (primary) hypertension: Secondary | ICD-10-CM | POA: Diagnosis not present

## 2021-11-25 DIAGNOSIS — Q8502 Neurofibromatosis, type 2: Secondary | ICD-10-CM | POA: Insufficient documentation

## 2021-11-25 DIAGNOSIS — Z86018 Personal history of other benign neoplasm: Secondary | ICD-10-CM | POA: Diagnosis not present

## 2021-11-25 DIAGNOSIS — E785 Hyperlipidemia, unspecified: Secondary | ICD-10-CM | POA: Insufficient documentation

## 2021-11-25 DIAGNOSIS — D649 Anemia, unspecified: Secondary | ICD-10-CM | POA: Diagnosis not present

## 2021-11-25 DIAGNOSIS — Z79899 Other long term (current) drug therapy: Secondary | ICD-10-CM | POA: Diagnosis not present

## 2021-11-25 DIAGNOSIS — Z791 Long term (current) use of non-steroidal anti-inflammatories (NSAID): Secondary | ICD-10-CM | POA: Insufficient documentation

## 2021-11-25 DIAGNOSIS — Z85820 Personal history of malignant melanoma of skin: Secondary | ICD-10-CM | POA: Diagnosis not present

## 2021-11-25 DIAGNOSIS — Z789 Other specified health status: Secondary | ICD-10-CM | POA: Diagnosis not present

## 2021-11-25 DIAGNOSIS — M5136 Other intervertebral disc degeneration, lumbar region: Secondary | ICD-10-CM | POA: Insufficient documentation

## 2021-11-25 MED ORDER — VITAMIN B-12 1000 MCG PO TABS: 1000.0000 ug | ORAL_TABLET | Freq: Every day | ORAL | 1 refills | Status: DC

## 2021-11-25 NOTE — Assessment & Plan Note (Signed)
Labs are reviewed and discussed with patient. Negative M protein on multiple myeloma panel, normal iron TIBC ferritin,normal LDH Light chain ratio slightly elevated, non specific, obtain 24 hour UPEP Hb is stable, slightly increased.  Possible bone marrow suppression from alcohol.  Encourage alcohol cessation, repeat hemoglobin level in 3 months.

## 2021-11-25 NOTE — Assessment & Plan Note (Signed)
Improved. No DVT on [place]

## 2021-11-25 NOTE — Assessment & Plan Note (Signed)
Encourage alcohol cessation  Recommend B12 supplementation 1000 mcg daily

## 2021-11-25 NOTE — Progress Notes (Signed)
Hematology/Oncology Progress note [phone] [phone]         Patient Care Team: Crecencio Mc, MD as PCP - General (Internal Medicine)   REFERRING PROVIDER: Crecencio Mc, MD  CHIEF COMPLAINTS/REASON FOR VISIT:  Anemia  ASSESSMENT & PLAN:   Normocytic anemia Labs are reviewed and discussed with patient. Negative M protein on multiple myeloma panel, normal iron TIBC ferritin,normal LDH Light chain ratio slightly elevated, non specific, obtain 24 hour UPEP Hb is stable, slightly increased.  Possible bone marrow suppression from alcohol.  Encourage alcohol cessation, repeat hemoglobin level in 3 months.   Right leg swelling Improved. No DVT on [place]  Alcohol use Encourage alcohol cessation  Recommend B12 supplementation 1000 mcg daily  Orders Placed This Encounter  Procedures   Retic Panel    Standing Status:   Future    Standing Expiration Date:   11/26/2022   Vitamin B12    Standing Status:   Future    Standing Expiration Date:   11/26/2022   Folate    Standing Status:   Future    Standing Expiration Date:   11/26/2022   IFE+PROTEIN ELECTRO, 24-HR UR    Standing Status:   Future    Standing Expiration Date:   11/26/2022   3 months.  All questions were answered. The patient knows to call the clinic with any problems, questions or concerns.  [name], MD, PhD [facility] Hematology Oncology 11/25/2021     HISTORY OF PRESENTING ILLNESS:  Rita Taylor is a  81 y.o.  female with PMH listed below who was referred to me for anemia Reviewed patient's recent labs that was done.  She was found to have abnormal CBC on 10/20/2021 with a hemoglobin 10.6.  Hematocrit 31.6.  Patient had a normal folate, vitamin B12, iron panel, protein after pheresis negative.  Urine protein electrophoresis immunofixation negative. Reviewed patient's previous labs ordered by primary care physician's office, anemia is chronic onset , duration is since January  2022. No aggravating or improving factors.  She denies recent chest pain on exertion, shortness of breath on minimal exertion, pre-syncopal episodes, or palpitations She had not noticed any recent bleeding such as epistaxis, hematuria or hematochezia.  She denies over the counter NSAID ingestion.  Patient drinks a glass of wine daily at lunch. 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  + Patient has noticed right extremity swelling for about 2 months. 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  Diffusely heterogeneous bone marrow signal which is nonspecific.  INTERVAL HISTORY Rita Taylor is a 81 y.o. female who has above history reviewed by me today presents for follow up visit for anemia She presents to discuss results.    MEDICAL HISTORY:  Past Medical History:  Diagnosis Date   History of shingles    Hyperlipidemia    Hypertension    Melanoma ([name]) 2008   removed by Koleen Nimrod, Wider excision by Tamala Julian left flank   Neurofibromatosis type II [facility])    Postoperative anemia 07/22/2018   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([place])     SURGICAL HISTORY: Past Surgical History:  Procedure Laterality Date   bitubal ligation  1981   BREAST ENHANCEMENT SURGERY  1990   BROW LIFT Left 01/24/2020   Procedure: TARSORRHAPHY, LATERAL PLACEMENT LEFT LOWER LID;  Surgeon: Vickki Muff, Amy  M, MD;  Location: [place];  Service: Ophthalmology;  Laterality: Left;   COLONOSCOPY     ECTROPION REPAIR Left 03/02/2019   Procedure: ECTROPION REPAIR, EXTENSIVE AND TARSORRHAPHY, LATERAL PLACEMENT OF LEFT LOWER LID;  Surgeon: Karle Starch, MD;  Location: [place];  Service: Ophthalmology;  Laterality: Left;   FACIAL COSMETIC SURGERY     GYNECOLOGIC CRYOSURGERY      15 years ago, normal since then, was treated with antibiotics, Annual pap smears for the past 20 years   TUMOR EXCISION Left 06/2018   ear    SOCIAL HISTORY: Social History   Socioeconomic History   Marital status: Married    Spouse name: Not on file   Number of children: Not on file   Years of education: Not on file   Highest education level: Not on file  Occupational History   Not on file  Tobacco Use   Smoking status: Never   Smokeless tobacco: Never  Vaping Use   Vaping Use: Never used  Substance and Sexual Activity   Alcohol use: Yes    Alcohol/week: 1.0 standard drink of alcohol    Types: 1 Glasses of wine per week    Comment: moderate half a glass daily   Drug use: No   Sexual activity: Yes  Other Topics Concern   Not on file  Social History Narrative   Lives with spouse.   Has 2 dogs and one cat, sold a horse farm years ago.   Exercises regularly 5 days a week at [employer] and also walks the dog   Social Determinants of Health   Financial Resource Strain: Low Risk  (05/25/2021)   Overall Financial Resource Strain (CARDIA)    Difficulty of Paying Living Expenses: Not hard at all  Food Insecurity: No Food Insecurity (05/25/2021)   Hunger Vital Sign    Worried About Running Out of Food in the Last Year: Never true    Ran Out of Food in the Last Year: Never true  Transportation Needs: No Transportation Needs (05/25/2021)   PRAPARE - Hydrologist (Medical): No    Lack of Transportation (Non-Medical): No  Physical Activity: Not on file  Stress: No Stress Concern Present (05/25/2021)   [place]    Feeling of Stress : Not at all  Social Connections: Unknown (05/25/2021)   Social Connection and Isolation Panel [NHANES]    Frequency of Communication with Friends and Family: Not on file    Frequency of Social Gatherings with Friends and Family: Not on file    Attends Religious  Services: Not on file    Active Member of Clubs or Organizations: Not on file    Attends Archivist Meetings: Not on file    Marital Status: Married  Intimate Partner Violence: Not At Risk (05/25/2021)   Humiliation, Afraid, Rape, and Kick questionnaire    Fear of Current or Ex-Partner: No    Emotionally Abused: No    Physically Abused: No    Sexually Abused: No    FAMILY HISTORY: Family History  Problem Relation Age of Onset   Hypertension Mother    High Cholesterol Mother    High Cholesterol Father    Hypertension Father    Hypertension Brother     ALLERGIES:  is allergic to dextromethorphan-guaifenesin, fosamax [alendronate], pseudoephedrine-dm-gg, and risedronate sodium.  MEDICATIONS:  Current Outpatient Medications  Medication Sig Dispense Refill   amLODipine (NORVASC) 5  MG tablet Take 1 tablet by mouth daily.     ASPIRIN 81 PO Take by mouth daily.     Biotin w/ Vitamins C & E (HAIR/SKIN/NAILS PO) Take by mouth daily.     buPROPion (WELLBUTRIN XL) 150 MG 24 hr tablet TAKE 1 TABLET(150 MG) BY MOUTH DAILY 30 tablet 2   Calcium Carbonate-Vit D-Min (CALCIUM 1200) 1200-1000 MG-UNIT CHEW Chew 1 tablet by mouth daily.     celecoxib (CELEBREX) 100 MG capsule TAKE 1 CAPSULE(100 MG) BY MOUTH TWICE DAILY 90 capsule 1   Cholecalciferol (VITAMIN D-3) 25 MCG (1000 UT) CAPS Take by mouth.     cyanocobalamin (VITAMIN B12) 1000 MCG tablet Take 1 tablet (1,000 mcg total) by mouth daily. 90 tablet 1   denosumab (PROLIA) 60 MG/ML SOSY injection Inject into the skin.     erythromycin ophthalmic ointment Apply to sutures 4 times a day for 10-12 days.  Discontinue if allergy develops and call our office 3.5 g 2   gabapentin (NEURONTIN) 100 MG capsule Take 1 capsule (100 mg total) by mouth 3 (three) times daily. 90 capsule 3   losartan (COZAAR) 100 MG tablet TAKE 1 TABLET(100 MG) BY MOUTH DAILY 90 tablet 1   pravastatin (PRAVACHOL) 40 MG tablet TAKE 1 TABLET(40 MG) BY MOUTH DAILY 90  tablet 1   No current facility-administered medications for this visit.    Review of Systems  Constitutional:  Negative for appetite change, chills, fatigue and fever.  HENT:   Positive for hearing loss. Negative for voice change.   Eyes:  Negative for eye problems.  Respiratory:  Negative for chest tightness and cough.   Cardiovascular:  Positive for leg swelling. Negative for chest pain.  Gastrointestinal:  Negative for abdominal distention, abdominal pain and blood in stool.  Endocrine: Negative for hot flashes.  Genitourinary:  Negative for difficulty urinating and frequency.   Musculoskeletal:  Negative for arthralgias.  Skin:  Negative for itching and rash.  Neurological:  Negative for extremity weakness.       Facial paralysis Foot drop  Hematological:  Negative for adenopathy.  Psychiatric/Behavioral:  Negative for confusion.     PHYSICAL EXAMINATION: ECOG PERFORMANCE STATUS: 1 - Symptomatic but completely ambulatory Vitals:   11/25/21 1319  BP: (!) 141/62  Pulse: 77  Resp: 18  SpO2: 100%   Filed Weights   11/25/21 1316  Weight: 110 lb (49.9 kg)    Physical Exam Constitutional:      General: She is not in acute distress. HENT:     Head: Normocephalic and atraumatic.  Eyes:     General: No scleral icterus. Cardiovascular:     Rate and Rhythm: Normal rate and regular rhythm.     Heart sounds: Normal heart sounds.  Pulmonary:     Effort: Pulmonary effort is normal. No respiratory distress.     Breath sounds: No wheezing.  Abdominal:     General: Bowel sounds are normal. There is no distension.     Palpations: Abdomen is soft.  Musculoskeletal:        General: No deformity. Normal range of motion.     Cervical back: Normal range of motion and neck supple.     Right lower leg: Edema present.  Skin:    General: Skin is warm and dry.  Neurological:     Mental Status: She is alert and oriented to person, place, and time. Mental status is at baseline.      Comments:  Left lagophthalmos Facial paralysis  Psychiatric:        Mood and Affect: Mood normal.      LABORATORY DATA:  I have reviewed the data as listed    Latest Ref Rng & Units 10/28/2021   11:43 AM 10/20/2021    2:33 PM 09/10/2020    9:42 AM  CBC  WBC 4.0 - 10.5 K/uL 6.7  6.5  6.5   Hemoglobin 12.0 - 15.0 g/dL 11.1  10.6  11.8   Hematocrit 36.0 - 46.0 % 34.9  31.6  35.5   Platelets 150 - 400 K/uL 211  213.0  192.0       Latest Ref Rng & Units 10/28/2021   11:43 AM 10/20/2021    2:33 PM 08/25/2021    7:33 AM  CMP  Glucose 70 - 99 mg/dL 105   112   BUN 8 - 23 mg/dL 31   20   Creatinine 0.44 - 1.00 mg/dL 0.71   0.75   Sodium 135 - 145 mmol/L 137   138   Potassium 3.5 - 5.1 mmol/L 4.4   4.1   Chloride 98 - 111 mmol/L 104   105   CO2 22 - 32 mmol/L 25   26   Calcium 8.9 - 10.3 mg/dL 8.5   8.9   Total Protein 6.5 - 8.1 g/dL 7.1  6.5  6.5   Total Bilirubin 0.3 - 1.2 mg/dL 0.7   0.6   Alkaline Phos 38 - 126 U/L 50   45   AST 15 - 41 U/L 18   16   ALT 0 - 44 U/L 17   16       Component Value Date/Time   IRON 89 10/28/2021 1143   TIBC 386 10/28/2021 1143   FERRITIN 40 10/28/2021 1143   IRONPCTSAT 23 10/28/2021 1143     RADIOGRAPHIC STUDIES: I have personally reviewed the radiological images as listed and agreed with the findings in the report. US Venous Img Lower Unilateral Right  Result Date: 11/04/2021 CLINICAL DATA:  Right lower extremity swelling EXAM: RIGHT LOWER EXTREMITY VENOUS DOPPLER ULTRASOUND TECHNIQUE: Gray-scale sonography with compression, as well as color and duplex ultrasound, were performed to evaluate the deep venous system(s) from the level of the common femoral vein through the popliteal and proximal calf veins. COMPARISON:  None Available. FINDINGS: VENOUS Normal compressibility of the common femoral, superficial femoral, and popliteal veins, as well as the visualized calf veins. Visualized portions of profunda femoral vein and great saphenous vein  unremarkable. No filling defects to suggest DVT on grayscale or color Doppler imaging. Doppler waveforms show normal direction of venous flow, normal respiratory plasticity and response to augmentation. Limited views of the contralateral common femoral vein are unremarkable. OTHER None. Limitations: none IMPRESSION: Negative. Electronically Signed   By: Jacqulynn Cadet M.D.   On: 11/04/2021 16:17

## 2021-11-26 DIAGNOSIS — R202 Paresthesia of skin: Secondary | ICD-10-CM | POA: Diagnosis not present

## 2021-11-26 DIAGNOSIS — D3610 Benign neoplasm of peripheral nerves and autonomic nervous system, unspecified: Secondary | ICD-10-CM | POA: Diagnosis not present

## 2021-11-26 DIAGNOSIS — G5712 Meralgia paresthetica, left lower limb: Secondary | ICD-10-CM | POA: Diagnosis not present

## 2021-11-26 DIAGNOSIS — F411 Generalized anxiety disorder: Secondary | ICD-10-CM | POA: Diagnosis not present

## 2021-11-26 DIAGNOSIS — R200 Anesthesia of skin: Secondary | ICD-10-CM | POA: Diagnosis not present

## 2021-11-26 DIAGNOSIS — G510 Bell's palsy: Secondary | ICD-10-CM | POA: Diagnosis not present

## 2021-11-30 DIAGNOSIS — D333 Benign neoplasm of cranial nerves: Secondary | ICD-10-CM | POA: Diagnosis not present

## 2021-12-02 DIAGNOSIS — D333 Benign neoplasm of cranial nerves: Secondary | ICD-10-CM | POA: Diagnosis not present

## 2021-12-03 ENCOUNTER — Other Ambulatory Visit: Payer: Self-pay

## 2021-12-03 DIAGNOSIS — D649 Anemia, unspecified: Secondary | ICD-10-CM

## 2021-12-03 DIAGNOSIS — Q8502 Neurofibromatosis, type 2: Secondary | ICD-10-CM | POA: Diagnosis not present

## 2021-12-03 DIAGNOSIS — I10 Essential (primary) hypertension: Secondary | ICD-10-CM | POA: Diagnosis not present

## 2021-12-03 DIAGNOSIS — M7989 Other specified soft tissue disorders: Secondary | ICD-10-CM | POA: Diagnosis not present

## 2021-12-03 DIAGNOSIS — M5136 Other intervertebral disc degeneration, lumbar region: Secondary | ICD-10-CM | POA: Diagnosis not present

## 2021-12-03 DIAGNOSIS — E785 Hyperlipidemia, unspecified: Secondary | ICD-10-CM | POA: Diagnosis not present

## 2021-12-04 LAB — IFE+PROTEIN ELECTRO, 24-HR UR
% BETA, Urine: 0 %
ALPHA 1 URINE: 0 %
Albumin, U: 100 %
Alpha 2, Urine: 0 %
GAMMA GLOBULIN URINE: 0 %
Total Protein, Urine-Ur/day: 154 mg/24 hr — ABNORMAL HIGH (ref 30–150)
Total Protein, Urine: 7 mg/dL
Total Volume: 2200

## 2021-12-10 ENCOUNTER — Telehealth: Payer: Self-pay | Admitting: *Deleted

## 2021-12-10 ENCOUNTER — Other Ambulatory Visit: Payer: Self-pay | Admitting: Family

## 2021-12-10 DIAGNOSIS — R200 Anesthesia of skin: Secondary | ICD-10-CM | POA: Diagnosis not present

## 2021-12-10 DIAGNOSIS — R202 Paresthesia of skin: Secondary | ICD-10-CM | POA: Diagnosis not present

## 2021-12-10 NOTE — Telephone Encounter (Signed)
Called and spoke to pt. Informed her of MD response and also informed here that she should reach out to PCP for further evaluation of protein in urine. Pt verbalized understanding.

## 2021-12-10 NOTE — Telephone Encounter (Signed)
Patient called asking about her 24 hour urine test results. Please call her regarding them  Rita Taylor UR Order: [id number] Status: Final result    Visible to patient: Yes (seen)    Next appt: 02/22/2022 at 02:45 PM in Oncology ([facility])    Dx: Normocytic anemia    0 Result Notes      Component Ref Range & Units 7 d ago 1 mo ago  Total Protein, Urine Not Estab. mg/dL 7.0    Total Protein, Urine-Ur/day 30 - 150 mg/24 hr 154 High     Albumin, U % 100.0  CANCELED R, CM   ALPHA 1 URINE % 0.0  CANCELED R, CM   Alpha 2, Urine % 0.0    % BETA, Urine % 0.0  CANCELED R, CM   GAMMA GLOBULIN URINE % 0.0  CANCELED R, CM   M-SPIKE %, Urine Not Observed % Not Observed    Immunofixation Result, Urine  Comment    Comment: (NOTE)  The immunofixation pattern appears unremarkable. Evidence of  monoclonal protein is not apparent.   Note:  Comment    Comment: (NOTE)  Protein electrophoresis scan will follow via computer, mail, or  courier delivery.  Performed At: [facility]  [place], [place] [id number]  Rush Farmer MD FB:[phone]   Total Volume  2,200    Comment: Performed at [facility], [address] [place], [place] [address]  Resulting Agency  [facility] [place]         Specimen Collected: 12/03/21 09:19 Last Resulted: 12/04/21 15:36

## 2021-12-11 ENCOUNTER — Other Ambulatory Visit: Payer: Self-pay | Admitting: Internal Medicine

## 2021-12-11 ENCOUNTER — Telehealth: Payer: Self-pay

## 2021-12-11 NOTE — Telephone Encounter (Signed)
Spoke with pt to let her know that the medication has been sent in. That it was not declined. Pt gave a verbal understanding.

## 2021-12-11 NOTE — Telephone Encounter (Signed)
Patient states she would like to know why Dr. Deborra Medina declined a refill for her buPROPion (WELLBUTRIN XL) 150 MG 24 hr tablet.

## 2022-01-04 DIAGNOSIS — H168 Other keratitis: Secondary | ICD-10-CM | POA: Diagnosis not present

## 2022-01-14 DIAGNOSIS — C44511 Basal cell carcinoma of skin of breast: Secondary | ICD-10-CM | POA: Diagnosis not present

## 2022-01-14 DIAGNOSIS — L821 Other seborrheic keratosis: Secondary | ICD-10-CM | POA: Diagnosis not present

## 2022-01-14 DIAGNOSIS — Z85820 Personal history of malignant melanoma of skin: Secondary | ICD-10-CM | POA: Diagnosis not present

## 2022-01-14 DIAGNOSIS — D485 Neoplasm of uncertain behavior of skin: Secondary | ICD-10-CM | POA: Diagnosis not present

## 2022-01-14 DIAGNOSIS — Z08 Encounter for follow-up examination after completed treatment for malignant neoplasm: Secondary | ICD-10-CM | POA: Diagnosis not present

## 2022-02-03 ENCOUNTER — Other Ambulatory Visit: Payer: Self-pay | Admitting: Internal Medicine

## 2022-02-11 DIAGNOSIS — C44511 Basal cell carcinoma of skin of breast: Secondary | ICD-10-CM | POA: Diagnosis not present

## 2022-02-11 DIAGNOSIS — C44519 Basal cell carcinoma of skin of other part of trunk: Secondary | ICD-10-CM | POA: Diagnosis not present

## 2022-02-22 ENCOUNTER — Other Ambulatory Visit: Payer: Self-pay

## 2022-02-22 ENCOUNTER — Inpatient Hospital Stay: Payer: Medicare Other | Attending: Oncology

## 2022-02-22 DIAGNOSIS — D649 Anemia, unspecified: Secondary | ICD-10-CM | POA: Diagnosis not present

## 2022-02-22 DIAGNOSIS — F1090 Alcohol use, unspecified, uncomplicated: Secondary | ICD-10-CM | POA: Insufficient documentation

## 2022-02-22 DIAGNOSIS — Z79899 Other long term (current) drug therapy: Secondary | ICD-10-CM | POA: Insufficient documentation

## 2022-02-22 DIAGNOSIS — M7989 Other specified soft tissue disorders: Secondary | ICD-10-CM | POA: Insufficient documentation

## 2022-02-22 LAB — RETIC PANEL
Immature Retic Fract: 1.9 % — ABNORMAL LOW (ref 2.3–15.9)
RBC.: 3.83 MIL/uL — ABNORMAL LOW (ref 3.87–5.11)
Retic Count, Absolute: 34.1 10*3/uL (ref 19.0–186.0)
Retic Ct Pct: 0.9 % (ref 0.4–3.1)
Reticulocyte Hemoglobin: 31.8 pg (ref >27.9)

## 2022-02-22 LAB — CBC WITH DIFFERENTIAL/PLATELET
Abs Immature Granulocytes: 0.01 10*3/uL (ref 0.00–0.07)
Basophils Absolute: 0.1 10*3/uL (ref 0.0–0.1)
Basophils Relative: 1 %
Eosinophils Absolute: 0.1 10*3/uL (ref 0.0–0.5)
Eosinophils Relative: 1 %
HCT: 33.5 % — ABNORMAL LOW (ref 36.0–46.0)
Hemoglobin: 11.1 g/dL — ABNORMAL LOW (ref 12.0–15.0)
Immature Granulocytes: 0 %
Lymphocytes Relative: 26 %
Lymphs Abs: 1.8 10*3/uL (ref 0.7–4.0)
MCH: 28.7 pg (ref 26.0–34.0)
MCHC: 33.1 g/dL (ref 30.0–36.0)
MCV: 86.6 fL (ref 80.0–100.0)
Monocytes Absolute: 0.5 10*3/uL (ref 0.1–1.0)
Monocytes Relative: 8 %
Neutro Abs: 4.6 10*3/uL (ref 1.7–7.7)
Neutrophils Relative %: 64 %
Platelets: 192 10*3/uL (ref 150–400)
RBC: 3.87 MIL/uL (ref 3.87–5.11)
RDW: 14.5 % (ref 11.5–15.5)
WBC: 7.1 10*3/uL (ref 4.0–10.5)
nRBC: 0 % (ref 0.0–0.2)

## 2022-02-22 LAB — VITAMIN B12: Vitamin B-12: 1043 pg/mL — ABNORMAL HIGH (ref 180–914)

## 2022-02-22 LAB — FOLATE: Folate: 13.4 ng/mL (ref >5.9)

## 2022-02-25 ENCOUNTER — Encounter: Payer: Self-pay | Admitting: Oncology

## 2022-02-25 ENCOUNTER — Inpatient Hospital Stay (HOSPITAL_BASED_OUTPATIENT_CLINIC_OR_DEPARTMENT_OTHER): Payer: Medicare Other | Admitting: Oncology

## 2022-02-25 ENCOUNTER — Other Ambulatory Visit: Payer: Medicare Other

## 2022-02-25 VITALS — BP 146/59 | HR 80 | Temp 98.0°F | Wt 116.0 lb

## 2022-02-25 DIAGNOSIS — Z79899 Other long term (current) drug therapy: Secondary | ICD-10-CM | POA: Diagnosis not present

## 2022-02-25 DIAGNOSIS — D649 Anemia, unspecified: Secondary | ICD-10-CM

## 2022-02-25 DIAGNOSIS — Z789 Other specified health status: Secondary | ICD-10-CM | POA: Diagnosis not present

## 2022-02-25 DIAGNOSIS — F1090 Alcohol use, unspecified, uncomplicated: Secondary | ICD-10-CM | POA: Diagnosis not present

## 2022-02-25 DIAGNOSIS — M7989 Other specified soft tissue disorders: Secondary | ICD-10-CM | POA: Diagnosis not present

## 2022-02-26 NOTE — Progress Notes (Signed)
Hematology/Oncology Progress note [phone] [phone]         Patient Care Team: Crecencio Mc, MD as PCP - General (Internal Medicine)   REFERRING PROVIDER: Crecencio Mc, MD  CHIEF COMPLAINTS/REASON FOR VISIT:  Anemia  ASSESSMENT & PLAN:   Normocytic anemia Previous work up showed negative M protein on multiple myeloma panel, normal iron TIBC ferritin,normal LDH Light chain ratio slightly elevated, non specific, negative 24 hour UPEP Hb is stable, slightly increased.  Possible bone marrow suppression from alcohol.  Observation.   Alcohol use Encourage alcohol cessation  Recommend B12 supplementation   Orders Placed This Encounter  Procedures   CBC with Differential/Platelet    Standing Status:   Future    Standing Expiration Date:   02/26/2023   Comprehensive metabolic panel    Standing Status:   Future    Standing Expiration Date:   02/25/2023   Ferritin    Standing Status:   Future    Standing Expiration Date:   02/26/2023   Iron and TIBC    Standing Status:   Future    Standing Expiration Date:   02/26/2023   Vitamin B12    Standing Status:   Future    Standing Expiration Date:   02/26/2023   6 months.  All questions were answered. The patient knows to call the clinic with any problems, questions or concerns.  Rita Server, MD, PhD [facility] Hematology Oncology 02/25/2022     HISTORY OF PRESENTING ILLNESS:  Rita Taylor is a  82 y.o.  female with PMH listed below who was referred to me for anemia Reviewed patient's recent labs that was done.  She was found to have abnormal CBC on 10/20/2021 with a hemoglobin 10.6.  Hematocrit 31.6.  Patient had a normal folate, vitamin B12, iron panel, protein after pheresis negative.  Urine protein electrophoresis immunofixation negative. Reviewed patient's previous labs ordered by primary care physician's office, anemia is chronic onset , duration is since January 2022. No aggravating or  improving factors.  She denies recent chest pain on exertion, shortness of breath on minimal exertion, pre-syncopal episodes, or palpitations She had not noticed any recent bleeding such as epistaxis, hematuria or hematochezia.  She denies over the counter NSAID ingestion.  Patient drinks a glass of wine daily at lunch. 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  + Patient has noticed right extremity swelling for about 2 months. 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  Diffusely heterogeneous bone marrow signal which is nonspecific.  INTERVAL HISTORY Rita Taylor is a 82 y.o. female who has above history reviewed by me today presents for follow up visit for anemia Since her last visit, she has stopped daily alcohol drinking, only drinks socially. She has no new complaints.    MEDICAL HISTORY:  Past Medical History:  Diagnosis Date   History of shingles    Hyperlipidemia    Hypertension    Melanoma ([place]) 2008   removed by Koleen Nimrod, Wider excision by Tamala Julian left flank   Neurofibromatosis type II [facility])    Postoperative anemia 07/22/2018   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([place])     SURGICAL HISTORY: Past Surgical History:  Procedure Laterality Date   bitubal ligation  [place] LIFT Left 01/24/2020  Procedure: TARSORRHAPHY, LATERAL PLACEMENT LEFT LOWER LID;  Surgeon: Karle Starch, MD;  Location: [place];  Service: Ophthalmology;  Laterality: Left;   COLONOSCOPY     ECTROPION REPAIR Left 03/02/2019   Procedure: ECTROPION REPAIR, EXTENSIVE AND TARSORRHAPHY, LATERAL PLACEMENT OF LEFT LOWER LID;  Surgeon: Karle Starch, MD;  Location: [place];  Service: Ophthalmology;  Laterality: Left;    FACIAL COSMETIC SURGERY     GYNECOLOGIC CRYOSURGERY     15 years ago, normal since then, was treated with antibiotics, Annual pap smears for the past 20 years   TUMOR EXCISION Left 06/2018   ear    SOCIAL HISTORY: Social History   Socioeconomic History   Marital status: Married    Spouse name: Not on file   Number of children: Not on file   Years of education: Not on file   Highest education level: Not on file  Occupational History   Not on file  Tobacco Use   Smoking status: Never   Smokeless tobacco: Never  Vaping Use   Vaping Use: Never used  Substance and Sexual Activity   Alcohol use: Yes    Alcohol/week: 1.0 standard drink of alcohol    Types: 1 Glasses of wine per week    Comment: moderate half a glass daily   Drug use: No   Sexual activity: Yes  Other Topics Concern   Not on file  Social History Narrative   Lives with spouse.   Has 2 dogs and one cat, sold a horse farm years ago.   Exercises regularly 5 days a week at [employer] and also walks the dog   Social Determinants of Health   Financial Resource Strain: Low Risk  (05/25/2021)   Overall Financial Resource Strain (CARDIA)    Difficulty of Paying Living Expenses: Not hard at all  Food Insecurity: No Food Insecurity (05/25/2021)   Hunger Vital Sign    Worried About Running Out of Food in the Last Year: Never true    Ran Out of Food in the Last Year: Never true  Transportation Needs: No Transportation Needs (05/25/2021)   PRAPARE - Hydrologist (Medical): No    Lack of Transportation (Non-Medical): No  Physical Activity: Not on file  Stress: No Stress Concern Present (05/25/2021)   [place]    Feeling of Stress : Not at all  Social Connections: Unknown (05/25/2021)   Social Connection and Isolation Panel [NHANES]    Frequency of Communication with Friends and Family: Not on file    Frequency of Social Gatherings  with Friends and Family: Not on file    Attends Religious Services: Not on file    Active Member of Clubs or Organizations: Not on file    Attends Archivist Meetings: Not on file    Marital Status: Married  Intimate Partner Violence: Not At Risk (05/25/2021)   Humiliation, Afraid, Rape, and Kick questionnaire    Fear of Current or Ex-Partner: No    Emotionally Abused: No    Physically Abused: No    Sexually Abused: No    FAMILY HISTORY: Family History  Problem Relation Age of Onset   Hypertension Mother    High Cholesterol Mother    High Cholesterol Father    Hypertension Father    Hypertension Brother     ALLERGIES:  is allergic to dextromethorphan-guaifenesin, fosamax [alendronate], pseudoephedrine-dm-gg, and risedronate sodium.  MEDICATIONS:  Current Outpatient  Medications  Medication Sig Dispense Refill   amLODipine (NORVASC) 5 MG tablet Take 1 tablet by mouth daily.     ASPIRIN 81 PO Take by mouth daily.     Biotin w/ Vitamins C & E (HAIR/SKIN/NAILS PO) Take by mouth daily.     buPROPion (WELLBUTRIN XL) 150 MG 24 hr tablet TAKE 1 TABLET(150 MG) BY MOUTH DAILY 90 tablet 1   Calcium Carbonate-Vit D-Min (CALCIUM 1200) 1200-1000 MG-UNIT CHEW Chew 1 tablet by mouth daily.     celecoxib (CELEBREX) 100 MG capsule TAKE 1 CAPSULE(100 MG) BY MOUTH TWICE DAILY 90 capsule 1   Cholecalciferol (VITAMIN D-3) 25 MCG (1000 UT) CAPS Take by mouth.     denosumab (PROLIA) 60 MG/ML SOSY injection Inject into the skin.     erythromycin ophthalmic ointment Apply to sutures 4 times a day for 10-12 days.  Discontinue if allergy develops and call our office 3.5 g 2   gabapentin (NEURONTIN) 100 MG capsule Take 1 capsule (100 mg total) by mouth 3 (three) times daily. 90 capsule 3   losartan (COZAAR) 100 MG tablet TAKE 1 TABLET(100 MG) BY MOUTH DAILY 90 tablet 1   pravastatin (PRAVACHOL) 40 MG tablet TAKE 1 TABLET(40 MG) BY MOUTH DAILY 90 tablet 1   cyanocobalamin (VITAMIN B12) 1000 MCG  tablet Take 1 tablet (1,000 mcg total) by mouth daily. (Patient not taking: Reported on 02/25/2022) 90 tablet 1   No current facility-administered medications for this visit.    Review of Systems  Constitutional:  Negative for appetite change, chills, fatigue and fever.  HENT:   Positive for hearing loss. Negative for voice change.   Eyes:  Negative for eye problems.  Respiratory:  Negative for chest tightness and cough.   Cardiovascular:  Negative for chest pain and leg swelling.  Gastrointestinal:  Negative for abdominal distention, abdominal pain and blood in stool.  Endocrine: Negative for hot flashes.  Genitourinary:  Negative for difficulty urinating and frequency.   Musculoskeletal:  Negative for arthralgias.  Skin:  Negative for itching and rash.  Neurological:  Negative for extremity weakness.       Facial paralysis Foot drop  Hematological:  Negative for adenopathy.  Psychiatric/Behavioral:  Negative for confusion.     PHYSICAL EXAMINATION: ECOG PERFORMANCE STATUS: 1 - Symptomatic but completely ambulatory Vitals:   02/25/22 1431  BP: (!) 146/59  Pulse: 80  Temp: 98 F (36.7 C)  SpO2: 96%   Filed Weights   02/25/22 1431  Weight: 116 lb (52.6 kg)    Physical Exam Constitutional:      General: She is not in acute distress. HENT:     Head: Normocephalic and atraumatic.  Eyes:     General: No scleral icterus. Cardiovascular:     Rate and Rhythm: Normal rate and regular rhythm.     Heart sounds: Normal heart sounds.  Pulmonary:     Effort: Pulmonary effort is normal. No respiratory distress.     Breath sounds: No wheezing.  Abdominal:     General: Bowel sounds are normal. There is no distension.     Palpations: Abdomen is soft.  Musculoskeletal:        General: No deformity. Normal range of motion.     Cervical back: Normal range of motion and neck supple.     Right lower leg: Edema present.  Skin:    General: Skin is warm and dry.  Neurological:      Mental Status: She is alert and oriented to person,  place, and time. Mental status is at baseline.     Comments:  Left lagophthalmos Facial paralysis  Psychiatric:        Mood and Affect: Mood normal.      LABORATORY DATA:  I have reviewed the data as listed    Latest Ref Rng & Units 02/22/2022    2:32 PM 10/28/2021   11:43 AM 10/20/2021    2:33 PM  CBC  WBC 4.0 - 10.5 K/uL 7.1  6.7  6.5   Hemoglobin 12.0 - 15.0 g/dL 11.1  11.1  10.6   Hematocrit 36.0 - 46.0 % 33.5  34.9  31.6   Platelets 150 - 400 K/uL 192  211  213.0       Latest Ref Rng & Units 10/28/2021   11:43 AM 10/20/2021    2:33 PM 08/25/2021    7:33 AM  CMP  Glucose 70 - 99 mg/dL 105   112   BUN 8 - 23 mg/dL 31   20   Creatinine 0.44 - 1.00 mg/dL 0.71   0.75   Sodium 135 - 145 mmol/L 137   138   Potassium 3.5 - 5.1 mmol/L 4.4   4.1   Chloride 98 - 111 mmol/L 104   105   CO2 22 - 32 mmol/L 25   26   Calcium 8.9 - 10.3 mg/dL 8.5   8.9   Total Protein 6.5 - 8.1 g/dL 7.1  6.5  6.5   Total Bilirubin 0.3 - 1.2 mg/dL 0.7   0.6   Alkaline Phos 38 - 126 U/L 50   45   AST 15 - 41 U/L 18   16   ALT 0 - 44 U/L 17   16       Component Value Date/Time   IRON 89 10/28/2021 1143   TIBC 386 10/28/2021 1143   FERRITIN 40 10/28/2021 1143   IRONPCTSAT 23 10/28/2021 1143     RADIOGRAPHIC STUDIES: I have personally reviewed the radiological images as listed and agreed with the findings in the report. No results found.

## 2022-02-26 NOTE — Assessment & Plan Note (Addendum)
Previous work up showed negative M protein on multiple myeloma panel, normal iron TIBC ferritin,normal LDH Light chain ratio slightly elevated, non specific, negative 24 hour UPEP Hb is stable, slightly increased.  Possible bone marrow suppression from alcohol.  Observation.

## 2022-02-26 NOTE — Assessment & Plan Note (Signed)
Encourage alcohol cessation  Recommend B12 supplementation

## 2022-03-04 ENCOUNTER — Telehealth: Payer: Self-pay | Admitting: Internal Medicine

## 2022-03-04 NOTE — Telephone Encounter (Signed)
Pt want to know if she should cancel her appointment at the spine center because she has a brace on. Pt would like to be called

## 2022-03-08 NOTE — Telephone Encounter (Signed)
Spoke with pt and stated that she did not cancel the appt but rescheduled it for later in April. Pt stated that she is getting a new brace for her foot and would like to see if that helps any with her back before seeing the spine doctor.

## 2022-03-13 ENCOUNTER — Other Ambulatory Visit: Payer: Self-pay | Admitting: Internal Medicine

## 2022-03-15 ENCOUNTER — Telehealth: Payer: Self-pay | Admitting: Internal Medicine

## 2022-03-15 NOTE — Telephone Encounter (Signed)
Prescription Request  03/15/2022  Is this a "Controlled Substance" medicine? No  LOV: 09/10/2021  What is the name of the medication or equipment? celecoxib (CELEBREX) 100 MG capsule, Patient is requesting  180 pills, she takes this 2x a day.  Have you contacted your pharmacy to request a refill? Yes   Which pharmacy would you like this sent to?  [id number] - [place], [place] - [address] [place] [address] Phone: [phone] Fax: [phone]    Patient notified that their request is being sent to the clinical staff for review and that they should receive a response within 2 business days.   Please advise at Mobile [phone] (mobile)

## 2022-03-16 MED ORDER — CELECOXIB 100 MG PO CAPS: ORAL_CAPSULE | ORAL | 0 refills | Status: DC

## 2022-03-16 NOTE — Telephone Encounter (Signed)
Medication refilled and pt is aware.

## 2022-03-16 NOTE — Addendum Note (Signed)
Addended by: Adair Laundry on: 03/16/2022 08:45 AM   Modules accepted: Orders

## 2022-03-17 ENCOUNTER — Ambulatory Visit (INDEPENDENT_AMBULATORY_CARE_PROVIDER_SITE_OTHER): Payer: Medicare Other | Admitting: Internal Medicine

## 2022-03-17 ENCOUNTER — Encounter: Payer: Self-pay | Admitting: Internal Medicine

## 2022-03-17 VITALS — BP 156/66 | HR 71 | Temp 97.8°F | Ht 62.5 in | Wt 114.2 lb

## 2022-03-17 DIAGNOSIS — E7800 Pure hypercholesterolemia, unspecified: Secondary | ICD-10-CM

## 2022-03-17 DIAGNOSIS — E119 Type 2 diabetes mellitus without complications: Secondary | ICD-10-CM

## 2022-03-17 DIAGNOSIS — I10 Essential (primary) hypertension: Secondary | ICD-10-CM

## 2022-03-17 DIAGNOSIS — M21371 Foot drop, right foot: Secondary | ICD-10-CM | POA: Diagnosis not present

## 2022-03-17 DIAGNOSIS — R1319 Other dysphagia: Secondary | ICD-10-CM

## 2022-03-17 DIAGNOSIS — M48062 Spinal stenosis, lumbar region with neurogenic claudication: Secondary | ICD-10-CM

## 2022-03-17 DIAGNOSIS — R1310 Dysphagia, unspecified: Secondary | ICD-10-CM | POA: Insufficient documentation

## 2022-03-17 LAB — MICROALBUMIN / CREATININE URINE RATIO
Creatinine,U: 90.2 mg/dL
Microalb Creat Ratio: 2.5 mg/g (ref 0.0–30.0)
Microalb, Ur: 2.3 mg/dL — ABNORMAL HIGH (ref 0.0–1.9)

## 2022-03-17 LAB — COMPREHENSIVE METABOLIC PANEL
ALT: 19 U/L (ref 0–35)
AST: 23 U/L (ref 0–37)
Albumin: 4.4 g/dL (ref 3.5–5.2)
Alkaline Phosphatase: 44 U/L (ref 39–117)
BUN: 20 mg/dL (ref 6–23)
CO2: 30 mEq/L (ref 19–32)
Calcium: 9.1 mg/dL (ref 8.4–10.5)
Chloride: 102 mEq/L (ref 96–112)
Creatinine, Ser: 0.79 mg/dL (ref 0.40–1.20)
GFR: 69.7 mL/min (ref >60.00)
Glucose, Bld: 93 mg/dL (ref 70–99)
Potassium: 3.9 mEq/L (ref 3.5–5.1)
Sodium: 140 mEq/L (ref 135–145)
Total Bilirubin: 0.6 mg/dL (ref 0.2–1.2)
Total Protein: 6.9 g/dL (ref 6.0–8.3)

## 2022-03-17 LAB — LDL CHOLESTEROL, DIRECT: Direct LDL: 77 mg/dL

## 2022-03-17 LAB — LIPID PANEL
Cholesterol: 179 mg/dL (ref 0–200)
HDL: 78.9 mg/dL (ref >39.00)
LDL Cholesterol: 86 mg/dL (ref 0–99)
NonHDL: 99.67
Total CHOL/HDL Ratio: 2
Triglycerides: 66 mg/dL (ref 0.0–149.0)
VLDL: 13.2 mg/dL (ref 0.0–40.0)

## 2022-03-17 LAB — HEMOGLOBIN A1C: Hgb A1c MFr Bld: 6.2 % (ref 4.6–6.5)

## 2022-03-17 MED ORDER — LOSARTAN POTASSIUM 100 MG PO TABS: ORAL_TABLET | ORAL | 1 refills | Status: DC

## 2022-03-17 MED ORDER — PRAVASTATIN SODIUM 40 MG PO TABS: ORAL_TABLET | ORAL | 3 refills | Status: DC

## 2022-03-17 NOTE — Assessment & Plan Note (Addendum)
She has been diagnosed with bilateral meralgia parastheticaby Dr Melrose Nakayama and a right foot drop  but reports bilateral thigh pain that has been managed effectivelly with gabapentin 300 mg tid prescribe by Melrose Nakayama.  no EMG studies have been done , abd he has not seen neurosurgery yet,  patient deferred because Dr Melrose Nakayama gave her a foot brace.   This is the first time I have seen her since I referred her in August to neurology for evaluation .    I am now Referring to [facility] for EMG studies,  referral to Meade Maw in progress as patient prefers to have any surgery done locally.

## 2022-03-17 NOTE — Assessment & Plan Note (Signed)
Suspent secondary to lumbar spinal stenosis.  EMG studies and neurosurgical consultation did not happen in August  unfortunately.  Referrals made today to alternative sources of workup

## 2022-03-17 NOTE — Progress Notes (Signed)
Subjective:  Patient ID: Rita Taylor, female    DOB: November 28, 1940  Age: 82 y.o. MRN: [id number]  CC: The primary encounter diagnosis was Primary hypertension. Diagnoses of DM type 2, goal HbA1c < 7% (HCC), Pure hypercholesterolemia, Spinal stenosis, lumbar region with neurogenic claudication, Esophageal dysphagia, and Foot drop, right were also pertinent to this visit.   HPI MEHREEN Taylor presents for follow up on multiple issues  Chief Complaint  Patient presents with   Medical Management of Chronic Issues    6 month follow up on hypertension and prediabetes  Last seen July 2024 , prior to f=plastic surgery and MRI   1) s/p left fascia lata suspension and 5-7 transfer performed on 7/31 by Dr. Sabra Heck .  Satisfied,  considering one more to correct lower lip /smile   2) right foot drop . And meralgia parasthetic left leg Had lumbar   MRI NOTING L SPINAL STENOSIS In August:   WAS referred to neurology and neurosurgery .  Evaluated by  Melrose Nakayama  by has not had  EMG Nerve conduction studies ,  only fitted for foot brace.  Patient deferred NEUROSURGERY. Consult because she was not aware that the foot brace was only correcting the symptoms .    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 Balance has gotten worse and she is now Having to use a cane  for the past 3 months due to poor balance  Taking 300 mg gabapentin tid for leg pain .  Sleeping great.   3) abnormal BM signal on MRI: referred to hematology. Saw Rita Taylor,    Anemia ,  ,no evidence of MM.  Alcohol cssation and B12 supplemettion advised   4) HTN:  taking losartan 100 mg  and amlodipine 5 mg daily  home readings    New issues:  trouble swallowing ,started after her reconstructive surgery  in July .  Food stops at the epiglottis . More noticeable at evening meal. Happens with soup.not occurring in the morning with pill taking or breakfast.   No cough or choking.  Noting she is burping a lot . Not taking a ppi or H2  blocker   Feels that her depression has resolved, has days where she is  actually happy   Chronic diarrhea: no longer managed with Imodium.  Now having soft stool and fecal leakage, unaware. Has added metamucil to help bulk up stools  Outpatient Medications Prior to Visit  Medication Sig Dispense Refill   amLODipine (NORVASC) 5 MG tablet Take 1 tablet by mouth daily.     ASPIRIN 81 PO Take by mouth daily.     Biotin w/ Vitamins C & E (HAIR/SKIN/NAILS PO) Take by mouth daily.     buPROPion (WELLBUTRIN XL) 150 MG 24 hr tablet TAKE 1 TABLET(150 MG) BY MOUTH DAILY 90 tablet 1   Calcium Carbonate-Vit D-Min (CALCIUM 1200) 1200-1000 MG-UNIT CHEW Chew 1 tablet by mouth daily.     celecoxib (CELEBREX) 100 MG capsule TAKE 1 CAPSULE(100 MG) BY MOUTH TWICE DAILY 180 capsule 0   Cholecalciferol (VITAMIN D-3) 25 MCG (1000 UT) CAPS Take by mouth.     cyanocobalamin (VITAMIN B12) 1000 MCG tablet Take 1 tablet (1,000 mcg total) by mouth daily. 90 tablet 1   denosumab (PROLIA) 60 MG/ML SOSY injection Inject into the skin.     erythromycin ophthalmic ointment Apply to sutures 4 times a day for 10-12 days.  Discontinue if allergy develops and call our office 3.5  g 2   gabapentin (NEURONTIN) 300 MG capsule Take 300 mg by mouth 3 (three) times daily.     losartan (COZAAR) 100 MG tablet TAKE 1 TABLET(100 MG) BY MOUTH DAILY 90 tablet 1   pravastatin (PRAVACHOL) 40 MG tablet TAKE 1 TABLET(40 MG) BY MOUTH DAILY 90 tablet 1   gabapentin (NEURONTIN) 100 MG capsule Take 1 capsule (100 mg total) by mouth 3 (three) times daily. 90 capsule 3   No facility-administered medications prior to visit.    Review of Systems;  Patient denies headache, fevers, malaise, unintentional weight loss, skin rash, eye pain, sinus congestion and sinus pain, sore throat, dysphagia,  hemoptysis , cough, dyspnea, wheezing, chest pain, palpitations, orthopnea, edema, abdominal pain, nausea, melena, diarrhea, constipation, flank pain, dysuria,  hematuria, urinary  Frequency, nocturia, numbness, tingling, seizures,  Focal weakness, Loss of consciousness,  Tremor, insomnia, depression, anxiety, and suicidal ideation.      Objective:  BP (!) 156/66   Pulse 71   Temp 97.8 F (36.6 C) (Oral)   Ht 5' 2.5" (1.588 m)   Wt 114 lb 3.2 oz (51.8 kg)   SpO2 96%   BMI 20.55 kg/m   BP Readings from Last 3 Encounters:  03/17/22 (!) 156/66  02/25/22 (!) 146/59  11/25/21 (!) 141/62    Wt Readings from Last 3 Encounters:  03/17/22 114 lb 3.2 oz (51.8 kg)  02/25/22 116 lb (52.6 kg)  11/25/21 110 lb (49.9 kg)    Physical Exam Vitals reviewed.  Constitutional:      General: She is not in acute distress.    Appearance: Normal appearance. She is normal weight. She is not ill-appearing, toxic-appearing or diaphoretic.  HENT:     Head: Normocephalic.  Eyes:     General: No scleral icterus.       Right eye: No discharge.        Left eye: No discharge.     Conjunctiva/sclera: Conjunctivae normal.  Cardiovascular:     Rate and Rhythm: Normal rate and regular rhythm.     Heart sounds: Normal heart sounds.  Pulmonary:     Effort: Pulmonary effort is normal. No respiratory distress.     Breath sounds: Normal breath sounds.  Musculoskeletal:        General: Normal range of motion.  Skin:    General: Skin is warm and dry.  Neurological:     Mental Status: She is alert and oriented to person, place, and time. Mental status is at baseline.     Cranial Nerves: Cranial nerve deficit and facial asymmetry present.     Sensory: Sensation is intact.     Motor: Weakness present.     Coordination: Coordination is intact.     Gait: Gait abnormal.     Deep Tendon Reflexes:     Reflex Scores:      Patellar reflexes are 0 on the right side and 1+ on the left side.    Comments: LEFT FACIAL NERVE PALSY (CHRONIC) RIGHT FOOT DROP   Psychiatric:        Mood and Affect: Mood normal.        Behavior: Behavior normal.        Thought Content:  Thought content normal.        Judgment: Judgment normal.     Lab Results  Component Value Date   HGBA1C 6.6 (H) 08/25/2021   HGBA1C 6.4 09/10/2020   HGBA1C 6.2 08/17/2019    Lab Results  Component Value Date  CREATININE 0.71 10/28/2021   CREATININE 0.75 08/25/2021   CREATININE 0.72 03/13/2021    Lab Results  Component Value Date   WBC 7.1 02/22/2022   HGB 11.1 (L) 02/22/2022   HCT 33.5 (L) 02/22/2022   PLT 192 02/22/2022   GLUCOSE 105 (H) 10/28/2021   CHOL 167 08/25/2021   TRIG 89 08/25/2021   HDL 72 08/25/2021   LDLDIRECT 137.0 01/06/2015   LDLCALC 78 08/25/2021   ALT 17 10/28/2021   AST 18 10/28/2021   NA 137 10/28/2021   K 4.4 10/28/2021   CL 104 10/28/2021   CREATININE 0.71 10/28/2021   BUN 31 (H) 10/28/2021   CO2 25 10/28/2021   TSH 1.18 10/19/2017   HGBA1C 6.6 (H) 08/25/2021   MICROALBUR 1.9 09/10/2020    US Venous Img Lower Unilateral Right  Result Date: 11/04/2021 CLINICAL DATA:  Right lower extremity swelling EXAM: RIGHT LOWER EXTREMITY VENOUS DOPPLER ULTRASOUND TECHNIQUE: Gray-scale sonography with compression, as well as color and duplex ultrasound, were performed to evaluate the deep venous system(s) from the level of the common femoral vein through the popliteal and proximal calf veins. COMPARISON:  None Available. FINDINGS: VENOUS Normal compressibility of the common femoral, superficial femoral, and popliteal veins, as well as the visualized calf veins. Visualized portions of profunda femoral vein and great saphenous vein unremarkable. No filling defects to suggest DVT on grayscale or color Doppler imaging. Doppler waveforms show normal direction of venous flow, normal respiratory plasticity and response to augmentation. Limited views of the contralateral common femoral vein are unremarkable. OTHER None. Limitations: none IMPRESSION: Negative. Electronically Signed   By: Jacqulynn Cadet M.D.   On: 11/04/2021 16:17    Assessment & Plan:  .Primary  hypertension -     Microalbumin / creatinine urine ratio -     Comprehensive metabolic panel  DM type 2, goal HbA1c < 7% (HCC) -     Hemoglobin A1c -     Microalbumin / creatinine urine ratio -     Comprehensive metabolic panel  Pure hypercholesterolemia -     Lipid panel -     LDL cholesterol, direct  Spinal stenosis, lumbar region with neurogenic claudication Assessment & Plan: She has been diagnosed with bilateral meralgia parastheticaby Dr Melrose Nakayama and a right foot drop  but reports bilateral thigh pain that has been managed effectivelly with gabapentin 300 mg tid prescribe by Melrose Nakayama.  no EMG studies have been done , abd he has not seen neurosurgery yet,  patient deferred because Dr Melrose Nakayama gave her a foot brace.   This is the first time I have seen her since I referred her in August to neurology for evaluation .    I am now Referring to [facility] for EMG studies,  referral to Me[facility] Maw in progress as patient prefers to have any surgery done locally.   Orders: -     Ambulatory referral to Neurology -     Ambulatory referral to Neurosurgery  Esophageal dysphagia Assessment & Plan: Patient reports that food is getting transiently lodged in throat every since her plastic surgery in July.  Only occurring at night.  Morning pills and breakfast not impacted.  No choking or coughing.  Barium swallow ordered   Orders: -     DG ESOPHAGUS W SINGLE CM (SOL OR THIN BA); Future  Foot drop, right Assessment & Plan: Suspent secondary to lumbar spinal stenosis.  EMG studies and neurosurgical consultation did not happen in August  unfortunately.  Referrals made today  to alternative sources of workup   Orders: -     Ambulatory referral to Neurology -     Ambulatory referral to Neurosurgery  Other orders -     Losartan Potassium; TAKE 1 TABLET(100 MG) BY MOUTH DAILY  Dispense: 90 tablet; Refill: 1 -     Pravastatin Sodium; TAKE 1 TABLET(40 MG) BY MOUTH DAILY  Dispense: 90 tablet; Refill:  3     I provided  a total of 70 minutes of face-to-face time during this encounter reviewing patient's last visit with me, patient's  most recent visit with ENT,  plastic surgery and neurology,  recent surgical and non surgical procedures, previous  labs and imaging studies, counseling on currently addressed issues,  and post visit ordering to diagnostics and therapeutics .   Follow-up: No follow-ups on file.   Crecencio Mc, MD

## 2022-03-17 NOTE — Assessment & Plan Note (Signed)
Patient reports that food is getting transiently lodged in throat every since her plastic surgery in July.  Only occurring at night.  Morning pills and breakfast not impacted.  No choking or coughing.  Barium swallow ordered

## 2022-03-17 NOTE — Patient Instructions (Addendum)
Your blood pressure is too high!  Please increase your amlodipine to 10 mg daiy and continue losartan as well.  The ShingRx vaccine is now available in local pharmacies and is  FREE to patients who have medicare.    I am going to see if Meade Maw can see you for local neurosurgery follow up,  do not cancel the appt with neurosurgeon at [facility] for  February 6 until you hear from [place].    I am ordering a swallow evaluation to see why food is stopping

## 2022-03-18 ENCOUNTER — Telehealth: Payer: Self-pay | Admitting: Internal Medicine

## 2022-03-18 NOTE — Telephone Encounter (Signed)
Spoke with pt to let her know that Dr. Derrel Nip does want her to increase the Amlodipine to 10 mg daily. I asked pt if she would like a new rx for the 10 mg sent in or if she would like to finish using the 5 mg tablets by taking two. Pt stated that she will take two of the 5 mg tablets until they are gone and let us know when to send a new rx in.

## 2022-03-18 NOTE — Telephone Encounter (Signed)
Patient was seen by Dr Derrel Nip yesterday. Patient thought that Dr Derrel Nip was going to increase her amLODipine (NORVASC) 5 MG tablet to '10mg'$ . Patient went to pick up medication and pharmacy did not have any prescriptions for '10mg'$  for her. She wanted to know if it was increased or not.

## 2022-03-23 ENCOUNTER — Telehealth: Payer: Self-pay | Admitting: *Deleted

## 2022-03-23 DIAGNOSIS — H02402 Unspecified ptosis of left eyelid: Secondary | ICD-10-CM | POA: Diagnosis not present

## 2022-03-23 DIAGNOSIS — H0223B Paralytic lagophthalmos left eye, upper and lower eyelids: Secondary | ICD-10-CM | POA: Diagnosis not present

## 2022-03-23 DIAGNOSIS — G510 Bell's palsy: Secondary | ICD-10-CM | POA: Diagnosis not present

## 2022-03-23 NOTE — Telephone Encounter (Signed)
$  0 due, PA not required. Pt due on or after 04/20/22. Sent mychart message for pt to schedule appt.

## 2022-03-26 ENCOUNTER — Ambulatory Visit
Admission: RE | Admit: 2022-03-26 | Discharge: 2022-03-26 | Disposition: A | Discharge status: Home / Self Care | Payer: Self-pay | Source: Ambulatory Visit | Attending: Neurosurgery | Admitting: Neurosurgery

## 2022-03-26 ENCOUNTER — Other Ambulatory Visit: Payer: Self-pay

## 2022-03-26 DIAGNOSIS — Z049 Encounter for examination and observation for unspecified reason: Secondary | ICD-10-CM

## 2022-03-29 NOTE — Progress Notes (Unsigned)
Referring Physician:  Crecencio Mc, MD [place],  [place] [id number]  Primary Physician:  Crecencio Mc, MD  History of Present Illness: 03/30/2022 Rita Taylor is here today with a chief complaint of Right foot drop for about 5-6 months she has a foot brace from Dr Melrose Nakayama but has not had her EMG yet.  She reports that she had pain in her right leg extending from her buttock down her leg to her ankle but that stopped approximately 1 month ago when she began gabapentin.  Her pain is no longer an issue.  She has had 5 to 6 months of weakness in her right lower extremity.  She originally tried a less rigid immobilizer which did not help significantly.  She has since been put in an ankle-foot orthotic which has been very helpful.  She is also using a cane.  She had her original episode with back and leg pain approximately 8 years ago, after which she began intermittent exercises with improvement. She has since had a few courses of injections to help with back and leg pain.    Bowel/Bladder Dysfunction: none  Conservative measures:  Physical therapy: has participated at [facility] in 08/2021 for piriformis; has not participated in for foot drop. Multimodal medical therapy including regular antiinflammatories: celebrex, gabapentin, tramadol, medrol dosepack Injections: has not received epidural steroid injections 12/03/14: left SI joint injection at [facility] 07/13/18: left SI joint injection at [facility] 03/13/19: right SI joint injection at [facility] 07/20/21: right hip injection (trochanteric bursa) at [facility] 08/13/21: right piriformis injection at [facility]  Past Surgery: none  Rita Taylor has no symptoms of cervical myelopathy.  The symptoms are causing a significant impact on the patient's life.   I have utilized the care everywhere function in epic to review the outside records available from external health  systems.  Review of Systems:  A 10 point review of systems is negative, except for the pertinent positives and negatives detailed in the HPI.  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[facility])     Past Surgical History: Past Surgical History:  Procedure Laterality Date   bitubal ligation  1981   BREAST ENHANCEMENT SURGERY  1990   BROW LIFT Left 01/24/2020   Procedure: TARSORRHAPHY, LATERAL PLACEMENT LEFT LOWER LID;  Surgeon: Karle Starch, MD;  Location: [place];  Service: Ophthalmology;  Laterality: Left;   COLONOSCOPY     ECTROPION REPAIR Left 03/02/2019   Procedure: ECTROPION REPAIR, EXTENSIVE AND TARSORRHAPHY, LATERAL PLACEMENT OF LEFT LOWER LID;  Surgeon: Karle Starch, MD;  Location: [place];  Service: Ophthalmology;  Laterality: Left;   FACIAL COSMETIC SURGERY     GYNECOLOGIC CRYOSURGERY     15 years ago, normal since then, was treated with antibiotics, Annual pap smears for the past 20 years   TUMOR EXCISION Left 06/2018   ear    Allergies: Allergies as of 03/30/2022 - Review Complete 03/30/2022  Allergen Reaction Noted   Dextromethorphan-guaifenesin Other (See Comments) 11/25/2021   Fosamax [alendronate]  07/03/2012   Pseudoephedrine-[id number]     Risedronate sodium  10/31/2010    Medications: No outpatient medications have  been marked as taking for the 03/30/22 encounter (Office Visit) with Meade Maw, MD.    Social History: Social History   Tobacco Use   Smoking status: Never   Smokeless tobacco: Never  Vaping Use   Vaping Use: Never used  Substance Use Topics   Alcohol use: Yes    Alcohol/week: 1.0 standard drink of alcohol    Types: 1 Glasses of wine per week    Comment: moderate  half a glass daily   Drug use: No    Family Medical History: Family History  Problem Relation Age of Onset   Hypertension Mother    High Cholesterol Mother    High Cholesterol Father    Hypertension Father    Hypertension Brother     Physical Examination: Vitals:   03/30/22 0906  BP: 136/82    General: Patient is well developed, well nourished, calm, collected, and in no apparent distress. Attention to examination is appropriate.  Neck:   Supple.  Full range of motion.  Respiratory: Patient is breathing without any difficulty.   NEUROLOGICAL:     Awake, alert, oriented to person, place, and time.  Speech is clear and fluent.   Cranial Nerves: Pupils equal round and reactive to light.  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 Shoulder shrug is symmetric. Tongue protrusion is midline.  There is no pronator drift.  ROM of spine: full.    Strength: Side Biceps Triceps Deltoid Interossei Grip Wrist Ext. Wrist Flex.  R 5 5 5 5 5 5 5  $ L 5 5 5 5 5 5 5   $ Side Iliopsoas Quads Hamstring PF DF EHL  R 5 5 5 $ 4- 2 2  L 5 5 5 5 5 5   $ Reflexes are 1+ and symmetric at the biceps, triceps, brachioradialis, patella and achilles.   Hoffman's is absent.   She has 2 out of 5 strength in right-sided eversion and inversion of her ankle  Bilateral upper and lower extremity sensation is intact to light touch.    No evidence of dysmetria noted.  Gait is abnormal and requires a cane.     Medical Decision Making  Imaging: MRI L spine 10/08/2021 IMPRESSION: 1. Multilevel lumbar disc and facet degeneration, most notable at L4-5 where there is severe facet arthrosis with grade 1 anterolisthesis, moderate spinal stenosis, and moderate left neural foraminal stenosis. 2. Mild-to-moderate spinal and moderate lateral recess stenosis at L3-4. 3. Mild-to-moderate neural foraminal stenosis at L3-4 and L5-S1. 4. Diffusely heterogeneous bone marrow signal which is  nonspecific, however malignant/infiltrative processes can have this appearance in addition to benign processes (anemia, advancing age, and smoking). Correlate with CBC, and if the patient has a history of malignancy, consider obtaining a nuclear medicine whole-body bone scan for further evaluation. Laboratory evaluation for multiple myeloma could also be considered.     Electronically Signed   By: Logan Bores M.D.   On: 10/09/2021 12:55  I have personally reviewed the images and agree with the above interpretation.  Assessment and Plan: Ms. Wences is a pleasant 82 y.o. female with significant lumbar stenosis without current symptoms of neurogenic claudication.  She previously had pain down her leg which is concerning for lumbar radiculopathy, but she is currently not suffering from this.  She is happy with her current level of pain control.  She has had a right-sided foot drop for 5 to 6 months.  This has not shown significant improvement.  We discussed  that this may be secondary to lumbar radiculopathy due to a pinched nerve, likely at the L4-5 level, or from peroneal palsy.  I think this is more likely due to her pinched nerves at the L4-5 level.  That being said, the available evidence suggests that surgery at this point does not lead to improvement in long-term motor function.  Thus, I have recommended that she consider her level of pain in order to inform any decisions on further intervention.  At this point, she does not have significant pain, so I have not recommended to consider surgical intervention.  I have reviewed her MRI scans of the brain as well.  She is seeing her radiation oncologist soon.  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  That may also complicate her function, as I think she is beginning to have some T2 signal in her cerebellar peduncles.  That may lead to some issues with her balance.  I encouraged her to continue  using her ankle-foot orthotic.  I will arrange for nerve conduction study to help elucidate the cause of her condition.  I will see her back in 2 months or sooner if necessary.  If she begins developing radicular type pain, we can always consider physical therapy and injections for that versus surgery.  I spent a total of 30 minutes in this patient's care today. This time was spent reviewing pertinent records including imaging studies, obtaining and confirming history, performing a directed evaluation, formulating and discussing my recommendations, and documenting the visit within the medical record.    Thank you for involving me in the care of this patient.      Kameron Glazebrook K. Izora Ribas MD, [facility] Neurosurgery

## 2022-03-30 ENCOUNTER — Telehealth: Payer: Self-pay

## 2022-03-30 ENCOUNTER — Encounter: Payer: Self-pay | Admitting: Neurosurgery

## 2022-03-30 ENCOUNTER — Ambulatory Visit (INDEPENDENT_AMBULATORY_CARE_PROVIDER_SITE_OTHER): Payer: Medicare Other | Admitting: Neurosurgery

## 2022-03-30 VITALS — BP 136/82 | Ht 62.5 in | Wt 115.6 lb

## 2022-03-30 DIAGNOSIS — M48061 Spinal stenosis, lumbar region without neurogenic claudication: Secondary | ICD-10-CM

## 2022-03-30 DIAGNOSIS — M21371 Foot drop, right foot: Secondary | ICD-10-CM

## 2022-03-30 NOTE — Telephone Encounter (Signed)
Dr Izora Ribas has ordered an EMG on this patient.

## 2022-03-30 NOTE — Telephone Encounter (Signed)
Referral faxed to [facility].

## 2022-04-05 DIAGNOSIS — R200 Anesthesia of skin: Secondary | ICD-10-CM | POA: Diagnosis not present

## 2022-04-12 ENCOUNTER — Telehealth: Payer: Self-pay | Admitting: *Deleted

## 2022-04-12 ENCOUNTER — Other Ambulatory Visit: Payer: Self-pay | Admitting: Internal Medicine

## 2022-04-12 NOTE — Telephone Encounter (Signed)
Pearline Cables will fax over report from 04/05/2022.

## 2022-04-12 NOTE — Telephone Encounter (Signed)
I called patient to discuss Prolia benefits & she wanted an update on her swallow study. Please contact patient with details.

## 2022-04-20 ENCOUNTER — Other Ambulatory Visit: Payer: Self-pay

## 2022-04-20 ENCOUNTER — Ambulatory Visit (INDEPENDENT_AMBULATORY_CARE_PROVIDER_SITE_OTHER): Payer: Medicare Other

## 2022-04-20 DIAGNOSIS — M810 Age-related osteoporosis without current pathological fracture: Secondary | ICD-10-CM

## 2022-04-20 MED ORDER — AMLODIPINE BESYLATE 10 MG PO TABS: 10.0000 mg | ORAL_TABLET | Freq: Every day | ORAL | 1 refills | Status: DC

## 2022-04-20 MED ORDER — DENOSUMAB 60 MG/ML ~~LOC~~ SOSY
60.0000 mg | PREFILLED_SYRINGE | Freq: Once | SUBCUTANEOUS | Status: AC
  Administered 2022-04-20: 60 mg via SUBCUTANEOUS

## 2022-04-20 NOTE — Telephone Encounter (Signed)
Sent in amlodipine 10 mg per Dr. Lupita Dawn last office note advising pt to increase to 10 mg from 5 mg.

## 2022-04-20 NOTE — Progress Notes (Signed)
Patient presented for Prolia injection to right arm, patient voiced no concerns nor showed any signs of distress during injection

## 2022-04-21 NOTE — Telephone Encounter (Signed)
I called [facility] to request results again

## 2022-04-27 ENCOUNTER — Ambulatory Visit: Payer: Medicare Other

## 2022-04-27 ENCOUNTER — Ambulatory Visit
Admission: RE | Admit: 2022-04-27 | Discharge: 2022-04-27 | Disposition: A | Discharge status: Home / Self Care | Payer: Medicare Other | Source: Ambulatory Visit | Attending: Internal Medicine | Admitting: Internal Medicine

## 2022-04-27 DIAGNOSIS — R1319 Other dysphagia: Secondary | ICD-10-CM

## 2022-04-27 DIAGNOSIS — R1310 Dysphagia, unspecified: Secondary | ICD-10-CM | POA: Diagnosis not present

## 2022-04-28 ENCOUNTER — Telehealth: Payer: Self-pay

## 2022-04-28 ENCOUNTER — Other Ambulatory Visit: Payer: Self-pay | Admitting: Internal Medicine

## 2022-04-28 DIAGNOSIS — K222 Esophageal obstruction: Secondary | ICD-10-CM

## 2022-04-28 NOTE — Telephone Encounter (Signed)
Patient aware of below.

## 2022-04-28 NOTE — Telephone Encounter (Signed)
Rita Taylor from [facility] called regarding her barium swallow test to let us know that the barium tablet did not pass beyond the distal esophagus. Results of swallow study in epic for your review

## 2022-05-03 ENCOUNTER — Other Ambulatory Visit: Payer: Self-pay

## 2022-05-03 ENCOUNTER — Encounter: Payer: Self-pay | Admitting: Internal Medicine

## 2022-05-03 DIAGNOSIS — K222 Esophageal obstruction: Secondary | ICD-10-CM

## 2022-05-03 NOTE — Telephone Encounter (Signed)
Neurology office said they routed it to [facility]. It has not been signed yet.  If he does not get it please let me know they will fax it.

## 2022-05-06 ENCOUNTER — Encounter: Admission: RE | Payer: Self-pay | Source: Home / Self Care

## 2022-05-06 ENCOUNTER — Ambulatory Visit: Admission: RE | Admit: 2022-05-06 | Payer: Medicare Other | Source: Home / Self Care | Admitting: Gastroenterology

## 2022-05-06 SURGERY — ESOPHAGOGASTRODUODENOSCOPY (EGD) WITH PROPOFOL
Anesthesia: General

## 2022-05-18 NOTE — Telephone Encounter (Signed)
EMG in care everywhere.

## 2022-05-28 DIAGNOSIS — R202 Paresthesia of skin: Secondary | ICD-10-CM | POA: Diagnosis not present

## 2022-05-28 DIAGNOSIS — G5712 Meralgia paresthetica, left lower limb: Secondary | ICD-10-CM | POA: Diagnosis not present

## 2022-05-28 DIAGNOSIS — M21371 Foot drop, right foot: Secondary | ICD-10-CM | POA: Diagnosis not present

## 2022-05-28 DIAGNOSIS — G510 Bell's palsy: Secondary | ICD-10-CM | POA: Diagnosis not present

## 2022-05-28 DIAGNOSIS — R200 Anesthesia of skin: Secondary | ICD-10-CM | POA: Diagnosis not present

## 2022-06-01 ENCOUNTER — Ambulatory Visit: Payer: Self-pay | Admitting: Neurosurgery

## 2022-06-03 ENCOUNTER — Ambulatory Visit (INDEPENDENT_AMBULATORY_CARE_PROVIDER_SITE_OTHER): Payer: Medicare Other

## 2022-06-03 VITALS — Ht 62.5 in | Wt 115.0 lb

## 2022-06-03 DIAGNOSIS — Z0000 Encounter for general adult medical examination without abnormal findings: Secondary | ICD-10-CM

## 2022-06-03 NOTE — Progress Notes (Signed)
Subjective:   Rita Taylor is a 82 y.o. female who presents for Medicare Annual (Subsequent) preventive examination.  Review of Systems    No ROS.  Medicare Wellness Virtual Visit.  Visual/audio telehealth visit, UTA vital signs.   See social history for additional risk factors.   Cardiac Risk Factors include: advanced age (>17men, >61 women)     Objective:    Today's Vitals   06/03/22 1539  Weight: 115 lb (52.2 kg)  Height: 5' 2.5" (1.588 m)   Body mass index is 20.7 kg/m.     06/03/2022    3:44 PM 11/25/2021    1:19 PM 10/28/2021   10:49 AM 05/25/2021    9:21 AM 05/22/2020    9:31 AM 01/24/2020   12:09 PM 03/02/2019   10:33 AM  Advanced Directives  Does Patient Have a Medical Advance Directive? Yes Yes Yes Yes Yes Yes Yes  Type of Estate agent of [place];Living will Living will;Healthcare Power of Attorney Living will;Healthcare Power of [employer] Power of [place];Living will Living will;Healthcare Power of [employer] Power of [place];Living will Living will;Healthcare Power of Attorney  Does patient want to make changes to medical advance directive? No - Patient declined No - Patient declined  No - Patient declined No - Patient declined No - Patient declined No - Patient declined  Copy of Healthcare Power of Attorney in Chart? Yes - validated most recent copy scanned in chart (See row information) No - copy requested No - copy requested Yes - validated most recent copy scanned in chart (See row information) Yes - validated most recent copy scanned in chart (See row information) Yes - validated most recent copy scanned in chart (See row information) No - copy requested    Current Medications (verified) Outpatient Encounter Medications as of 06/03/2022  Medication Sig   amLODipine (NORVASC) 10 MG tablet Take 1 tablet (10 mg total) by mouth daily.   amLODipine (NORVASC) 5 MG tablet TAKE 1 TABLET BY MOUTH DAILY   ASPIRIN 81 PO Take  by mouth daily.   Biotin w/ Vitamins C & E (HAIR/SKIN/NAILS PO) Take by mouth daily.   buPROPion (WELLBUTRIN XL) 150 MG 24 hr tablet TAKE 1 TABLET(150 MG) BY MOUTH DAILY   Calcium Carbonate-Vit D-Min (CALCIUM 1200) 1200-1000 MG-UNIT CHEW Chew 1 tablet by mouth daily.   celecoxib (CELEBREX) 100 MG capsule TAKE 1 CAPSULE(100 MG) BY MOUTH TWICE DAILY   Cholecalciferol (VITAMIN D-3) 25 MCG (1000 UT) CAPS Take by mouth.   cyanocobalamin (VITAMIN B12) 1000 MCG tablet Take 1 tablet (1,000 mcg total) by mouth daily.   denosumab (PROLIA) 60 MG/ML SOSY injection Inject into the skin.   erythromycin ophthalmic ointment Apply to sutures 4 times a day for 10-12 days.  Discontinue if allergy develops and call our office (Patient taking differently: Apply to sutures 2 times a day for 10-12 days.  Discontinue if allergy develops and call our office)   gabapentin (NEURONTIN) 300 MG capsule Take 300 mg by mouth 3 (three) times daily.   losartan (COZAAR) 100 MG tablet TAKE 1 TABLET(100 MG) BY MOUTH DAILY   pravastatin (PRAVACHOL) 40 MG tablet TAKE 1 TABLET(40 MG) BY MOUTH DAILY   No facility-administered encounter medications on file as of 06/03/2022.    Allergies (verified) Dextromethorphan-guaifenesin, Fosamax [alendronate], Pseudoephedrine-dm-gg, and Risedronate sodium   History: Past Medical History:  Diagnosis Date   History of shingles    Hyperlipidemia    Hypertension    Melanoma 2008  removed by Orson Aloe, Wider excision by Katrinka Blazing left flank   Neurofibromatosis type II    Postoperative anemia 07/22/2018   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Past Surgical History:  Procedure Laterality Date   bitubal ligation  1981   BREAST ENHANCEMENT SURGERY  1990   BROW LIFT Left 01/24/2020   Procedure: TARSORRHAPHY, LATERAL PLACEMENT LEFT LOWER LID;  Surgeon: Imagene Riches, MD;  Location: [facility];  Service: Ophthalmology;  Laterality: Left;   COLONOSCOPY     ECTROPION REPAIR Left 03/02/2019   Procedure:  ECTROPION REPAIR, EXTENSIVE AND TARSORRHAPHY, LATERAL PLACEMENT OF LEFT LOWER LID;  Surgeon: Imagene Riches, MD;  Location[facility];  Service: Ophthalmology;  Laterality: Left;   FACIAL COSMETIC SURGERY     GYNECOLOGIC CRYOSURGERY     15 years ago, normal since then, was treated with antibiotics, Annual pap smears for the past 20 years   TUMOR EXCISION Left 06/2018   ear   Family History  Problem Relation Age of Onset   Hypertension Mother    High Cholesterol Mother    High Cholesterol Father    Hypertension Father    Hypertension Brother    Social History   Socioeconomic History   Marital status: Married    Spouse name: Not on file   Number of children: Not on file   Years of education: Not on file   Highest education level: Not on file  Occupational History   Not on file  Tobacco Use   Smoking status: Never   Smokeless tobacco: Never  Vaping Use   Vaping Use: Never used  Substance and Sexual Activity   Alcohol use: Yes    Alcohol/week: 1.0 standard drink of alcohol    Types: 1 Glasses of wine per week    Comment: moderate half a glass daily   Drug use: No   Sexual activity: Yes  Other Topics Concern   Not on file  Social History Narrative   Lives with spouse.   Has 2 dogs and one cat, sold a horse farm years ago.   Exercises regularly 5 days a week at [employer] and also walks the dog   Social Determinants of Health   Financial Resource Strain: Low Risk  (06/03/2022)   Overall Financial Resource Strain (CARDIA)    Difficulty of Paying Living Expenses: Not hard at all  Food Insecurity: No Food Insecurity (06/03/2022)   Hunger Vital Sign    Worried About Running Out of Food in the Last Year: Never true    Ran Out of Food in the Last Year: Never true  Transportation Needs: No Transportation Needs (06/03/2022)   PRAPARE - Administrator, Civil Service (Medical): No    Lack of Transportation (Non-Medical): No  Physical Activity: Unknown (06/03/2022)    Exercise Vital Sign    Days of Exercise per Week: 3 days    Minutes of Exercise per Session: Not on file  Stress: No Stress Concern Present (06/03/2022)   [employer] of Occupational Health - Occupational Stress Questionnaire    Feeling of Stress : Not at all  Social Connections: Unknown (06/03/2022)   Social Connection and Isolation Panel [NHANES]    Frequency of Communication with Friends and Family: Not on file    Frequency of Social Gatherings with Friends and Family: Not on file    Attends Religious Services: Not on file    Active Member of Clubs or Organizations: Not on file    Attends Club or  Organization Meetings: Not on file    Marital Status: Married    Tobacco Counseling Counseling given: Not Answered   Clinical Intake:  Pre-visit preparation completed: Yes        Diabetes:  (Followed by PCP)  How often do you need to have someone help you when you read instructions, pamphlets, or other written materials from your doctor or pharmacy?: 1 - Never    Interpreter Needed?: No      Activities of Daily Living    06/03/2022    3:46 PM  In your present state of health, do you have any difficulty performing the following activities:  Hearing? 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 0  Difficulty concentrating or making decisions? 0  Walking or climbing stairs? 0  Comment Paces self with activity  Dressing or bathing? 0  Doing errands, shopping? 1  Comment Husband Insurance claims handler and eating ? N  Using the Toilet? N  In the past six months, have you accidently leaked urine? Y  Comment Managed with heavy pad at bedtime. Small pad at daytime as needed.  Do you have problems with loss of bowel control? N  Managing your Medications? N  Managing your Finances? N  Housekeeping or managing your Housekeeping? N    Patient Care Team: [name], MD as PCP - General (Internal Medicine)  Indicate any recent Medical  Services you may have received from other than [facility] providers in the past year (date may be approximate).     Assessment:   This is a routine wellness examination for Rita Taylor.  I connected with  Rita Taylor on 06/03/22 by a audio enabled telemedicine application and verified that I am speaking with the correct person using two identifiers.  Patient Location: Home  Provider Location: Office/Clinic  I discussed the limitations of evaluation and management by telemedicine. The patient expressed understanding and agreed to proceed.   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 Stay hydrated. Healthy diet.       Depression Screen    06/03/2022    3:46 PM 03/17/2022    9:01 AM 09/10/2021    9:22 AM 09/10/2021    8:44 AM 07/03/2021    4:46 PM 05/25/2021    9:09 AM 03/13/2021    9:21 AM  PHQ 2/9 Scores  PHQ - 2 Score 0 0 4 4 6  0 4  PHQ- 9 Score  0 11  15  8     Fall Risk    06/03/2022    3:46 PM 03/17/2022    9:01 AM 09/10/2021    8:44 AM 07/03/2021    4:09 PM 05/25/2021    9:08 AM  Fall Risk   Falls in the past year? 0 0 1 0 0  Number falls in past yr: 0 0 0  0  Injury with Fall? 0 0 0    Risk  for fall due to :  No Fall Risks History of fall(s) No Fall Risks   Follow up Falls evaluation completed;Falls prevention discussed Falls evaluation completed Falls evaluation completed Falls evaluation completed Falls evaluation completed  Comment Cane in use  as needed        FALL RISK PREVENTION PERTAINING TO THE HOME: Home free of loose throw rugs in walkways, pet beds, electrical cords, etc? Yes  Adequate lighting in your home to reduce risk of falls? Yes   ASSISTIVE DEVICES UTILIZED TO PREVENT FALLS: Life alert? No  Use of a cane, walker or w/c? Yes  Grab bars in the bathroom? No  Shower chair or bench in shower? No  Elevated toilet seat or a handicapped toilet? No   TIMED UP AND GO: Was the test performed? No .    Cognitive Function:    01/23/2015   10:45 AM  MMSE - Mini Mental State Exam  Orientation to time 5  Orientation to Place 5  Registration 3  Attention/ Calculation 5  Recall 3  Language- name 2 objects 2  Language- repeat 1  Language- follow 3 step command 3  Language- read & follow direction 1  Write a sentence 1  Copy design 1  Total score 30        06/03/2022    4:01 PM  6CIT Screen  What Year? 0 points  What month? 0 points  What time? 0 points  Count back from 20 0 points  Months in reverse 0 points  Repeat phrase 0 points  Total Score 0 points    Immunizations Immunization History  Administered Date(s) Administered   Influenza Split 11/15/2011   Influenza, High Dose Seasonal PF 11/10/2017, 10/13/2018   Influenza,inj,Quad PF,6+ Mos 11/08/2012, 11/12/2013   Influenza-Unspecified 11/06/2014, 10/30/2015, 10/11/2018, 11/29/2019, 11/27/2020, 11/15/2021   Moderna Sars-Covid-2 Vaccination 03/24/2019, 04/14/2019, 12/20/2019   Pneumococcal Conjugate-13 11/12/2013   Pneumococcal Polysaccharide-23 11/08/2012   Tdap 10/13/2011   Zoster, Live 07/03/2008   TDAP status: Due, Education has been provided regarding the importance of this vaccine. Advised may receive this vaccine at local pharmacy or Health Dept. Aware to provide a copy of the vaccination record if obtained from local pharmacy or Health Dept. Verbalized acceptance and understanding.   Shingrix Completed?: No.    Education has been provided  regarding the importance of this vaccine. Patient has been advised to call insurance company to determine out of pocket expense if they have not yet received this vaccine. Advised may also receive vaccine at local pharmacy or Health Dept. Verbalized acceptance and understanding.  Screening Tests Health Maintenance  Topic Date Due   FOOT EXAM  Never done   DTaP/Tdap/Td (2 - Td or Tdap) 10/12/2021   COVID-19 Vaccine (4 - 2023-24 season) 06/19/2022 (Originally 10/16/2021)   Zoster Vaccines- Shingrix (1 of 2) 09/02/2022 (Originally 11/11/1959)   HEMOGLOBIN A1C  09/15/2022   INFLUENZA VACCINE  09/16/2022   OPHTHALMOLOGY EXAM  03/02/2023   Diabetic kidney evaluation - eGFR measurement  03/18/2023   Diabetic kidney evaluation - Urine ACR  03/18/2023   Medicare Annual Wellness (AWV)  06/03/2023   Pneumonia Vaccine 62+ Years old  Completed   DEXA SCAN  Completed   HPV VACCINES  Aged Out   MAMMOGRAM  Discontinued    Health Maintenance Health Maintenance Due  Topic Date Due   FOOT EXAM  Never done   DTaP/Tdap/Td (2 - Td or Tdap) 10/12/2021   Lung Cancer Screening: (Low Dose CT Chest recommended if  Age 9-80 years, 30 pack-year currently smoking OR have quit w/in 15years.) does not qualify.   Hepatitis C Screening: does not qualify.  Vision Screening: Recommended annual ophthalmology exams for early detection of glaucoma and other disorders of the eye.  Dental Screening: Recommended annual dental exams for proper oral hygiene  Community Resource Referral / Chronic Care Management: CRR required this visit?  No   CCM required this visit?  No      Plan:     I have personally reviewed and noted the following in the patient's chart:   Medical and social history Use of alcohol, tobacco or illicit drugs  Current medications and supplements including opioid prescriptions. Patient is not currently taking opioid prescriptions. Functional ability and status Nutritional status Physical  activity Advanced directives List of other physicians Hospitalizations, surgeries, and ER visits in previous 12 months Vitals Screenings to include cognitive, depression, and falls Referrals and appointments  In addition, I have reviewed and discussed with patient certain preventive protocols, quality metrics, and best practice recommendations. A written personalized care plan for preventive services as well as general preventive health recommendations were provided to patient.     Cathey Endow, LPN   1/61/0960

## 2022-06-03 NOTE — Patient Instructions (Addendum)
Rita Taylor , Thank you for taking time to come for your Medicare Wellness Visit. I appreciate your ongoing commitment to your health goals. Please review the following plan we discussed and let me know if I can assist you in the future.   These are the goals we discussed:  Goals       Patient Stated     Maintain healthy lifestyle (pt-stated)      Stay active. Stay hydrated. Healthy diet.        This is a list of the screening recommended for you and due dates:  Health Maintenance  Topic Date Due   Complete foot exam   Never done   DTaP/Tdap/Td vaccine (2 - Td or Tdap) 10/12/2021   COVID-19 Vaccine (4 - 2023-24 season) 06/19/2022*   Zoster (Shingles) Vaccine (1 of 2) 09/02/2022*   Hemoglobin A1C  09/15/2022   Flu Shot  09/16/2022   Eye exam for diabetics  03/02/2023   Yearly kidney function blood test for diabetes  03/18/2023   Yearly kidney health urinalysis for diabetes  03/18/2023   Medicare Annual Wellness Visit  06/03/2023   Pneumonia Vaccine  Completed   DEXA scan (bone density measurement)  Completed   HPV Vaccine  Aged Out   Mammogram  Discontinued  *Topic was postponed. The date shown is not the original due date.    Advanced directives: on file  Conditions/risks identified: none new  Next appointment: Follow up in one year for your annual wellness visit    Preventive Care 65 Years and Older, Female Preventive care refers to lifestyle choices and visits with your health care provider that can promote health and wellness. What does preventive care include? A yearly physical exam. This is also called an annual well check. Dental exams once or twice a year. Routine eye exams. Ask your health care provider how often you should have your eyes checked. Personal lifestyle choices, including: Daily care of your teeth and gums. Regular physical activity. Eating a healthy diet. Avoiding tobacco and drug use. Limiting alcohol use. Practicing safe sex. Taking  low-dose aspirin every day. Taking vitamin and mineral supplements as recommended by your health care provider. What happens during an annual well check? The services and screenings done by your health care provider during your annual well check will depend on your age, overall health, lifestyle risk factors, and family history of disease. Counseling  Your health care provider may ask you questions about your: Alcohol use. Tobacco use. Drug use. Emotional well-being. Home and relationship well-being. Sexual activity. Eating habits. History of falls. Memory and ability to understand (cognition). Work and work Astronomer. Reproductive health. Screening  You may have the following tests or measurements: Height, weight, and BMI. Blood pressure. Lipid and cholesterol levels. These may be checked every 5 years, or more frequently if you are over 62 years old. Skin check. Lung cancer screening. You may have this screening every year starting at age 38 if you have a 30-pack-year history of smoking and currently smoke or have quit within the past 15 years. Fecal occult blood test (FOBT) of the stool. You may have this test every year starting at age 11. Flexible sigmoidoscopy or colonoscopy. You may have a sigmoidoscopy every 5 years or a colonoscopy every 10 years starting at age 40. Hepatitis C blood test. Hepatitis B blood test. Sexually transmitted disease (STD) testing. Diabetes screening. This is done by checking your blood sugar (glucose) after you have not eaten for a while (fasting).  You may have this done every 1-3 years. Bone density scan. This is done to screen for osteoporosis. You may have this done starting at age 31. Mammogram. This may be done every 1-2 years. Talk to your health care provider about how often you should have regular mammograms. Talk with your health care provider about your test results, treatment options, and if necessary, the need for more tests. Vaccines   Your health care provider may recommend certain vaccines, such as: Influenza vaccine. This is recommended every year. Tetanus, diphtheria, and acellular pertussis (Tdap, Td) vaccine. You may need a Td booster every 10 years. Zoster vaccine. You may need this after age 72. Pneumococcal 13-valent conjugate (PCV13) vaccine. One dose is recommended after age 31. Pneumococcal polysaccharide (PPSV23) vaccine. One dose is recommended after age 70. Talk to your health care provider about which screenings and vaccines you need and how often you need them. This information is not intended to replace advice given to you by your health care provider. Make sure you discuss any questions you have with your health care provider. Document Released: 02/28/2015 Document Revised: 10/22/2015 Document Reviewed: 12/03/2014 Elsevier Interactive Patient Education  2017 [place] Prevention in the Home Falls can cause injuries. They can happen to people of all ages. There are many things you can do to make your home safe and to help prevent falls. What can I do on the outside of my home? Regularly fix the edges of walkways and driveways and fix any cracks. Remove anything that might make you trip as you walk through a door, such as a raised step or threshold. Trim any bushes or trees on the path to your home. Use bright outdoor lighting. Clear any walking paths of anything that might make someone trip, such as rocks or tools. Regularly check to see if handrails are loose or broken. Make sure that both sides of any steps have handrails. Any raised decks and porches should have guardrails on the edges. Have any leaves, snow, or ice cleared regularly. Use sand or salt on walking paths during winter. Clean up any spills in your garage right away. This includes oil or grease spills. What can I do in the bathroom? Use night lights. Install grab bars by the toilet and in the tub and shower. Do not use towel  bars as grab bars. Use non-skid mats or decals in the tub or shower. If you need to sit down in the shower, use a plastic, non-slip stool. Keep the floor dry. Clean up any water that spills on the floor as soon as it happens. Remove soap buildup in the tub or shower regularly. Attach bath mats securely with double-sided non-slip rug tape. Do not have throw rugs and other things on the floor that can make you trip. What can I do in the bedroom? Use night lights. Make sure that you have a light by your bed that is easy to reach. Do not use any sheets or blankets that are too big for your bed. They should not hang down onto the floor. Have a firm chair that has side arms. You can use this for support while you get dressed. Do not have throw rugs and other things on the floor that can make you trip. What can I do in the kitchen? Clean up any spills right away. Avoid walking on wet floors. Keep items that you use a lot in easy-to-reach places. If you need to reach something above you, use a  strong step stool that has a grab bar. Keep electrical cords out of the way. Do not use floor polish or wax that makes floors slippery. If you must use wax, use non-skid floor wax. Do not have throw rugs and other things on the floor that can make you trip. What can I do with my stairs? Do not leave any items on the stairs. Make sure that there are handrails on both sides of the stairs and use them. Fix handrails that are broken or loose. Make sure that handrails are as long as the stairways. Check any carpeting to make sure that it is firmly attached to the stairs. Fix any carpet that is loose or worn. Avoid having throw rugs at the top or bottom of the stairs. If you do have throw rugs, attach them to the floor with carpet tape. Make sure that you have a light switch at the top of the stairs and the bottom of the stairs. If you do not have them, ask someone to add them for you. What else can I do to help  prevent falls? Wear shoes that: Do not have high heels. Have rubber bottoms. Are comfortable and fit you well. Are closed at the toe. Do not wear sandals. If you use a stepladder: Make sure that it is fully opened. Do not climb a closed stepladder. Make sure that both sides of the stepladder are locked into place. Ask someone to hold it for you, if possible. Clearly mark and make sure that you can see: Any grab bars or handrails. First and last steps. Where the edge of each step is. Use tools that help you move around (mobility aids) if they are needed. These include: Canes. Walkers. Scooters. Crutches. Turn on the lights when you go into a dark area. Replace any light bulbs as soon as they burn out. Set up your furniture so you have a clear path. Avoid moving your furniture around. If any of your floors are uneven, fix them. If there are any pets around you, be aware of where they are. Review your medicines with your doctor. Some medicines can make you feel dizzy. This can increase your chance of falling. Ask your doctor what other things that you can do to help prevent falls. This information is not intended to replace advice given to you by your health care provider. Make sure you discuss any questions you have with your health care provider. Document Released: 11/28/2008 Document Revised: 07/10/2015 Document Reviewed: 03/08/2014 Elsevier Interactive Patient Education  2017 [employer].

## 2022-06-15 ENCOUNTER — Other Ambulatory Visit: Payer: Self-pay | Admitting: Internal Medicine

## 2022-06-16 NOTE — Telephone Encounter (Signed)
Pt called in staying that when she went to pick up med celecoxib (CELEBREX) 100 MG capsule was denied. And she was wondering what's going on? She wants to let Dr. Darrick Huntsman knows.

## 2022-06-17 ENCOUNTER — Ambulatory Visit: Payer: Medicare Other | Admitting: Neurosurgery

## 2022-06-22 ENCOUNTER — Telehealth: Payer: Self-pay | Admitting: Neurosurgery

## 2022-06-22 ENCOUNTER — Encounter: Payer: Self-pay | Admitting: Neurosurgery

## 2022-06-22 ENCOUNTER — Ambulatory Visit (INDEPENDENT_AMBULATORY_CARE_PROVIDER_SITE_OTHER): Payer: Medicare Other | Admitting: Neurosurgery

## 2022-06-22 VITALS — BP 130/72 | Ht 63.0 in | Wt 109.0 lb

## 2022-06-22 DIAGNOSIS — M48062 Spinal stenosis, lumbar region with neurogenic claudication: Secondary | ICD-10-CM | POA: Diagnosis not present

## 2022-06-22 MED ORDER — GABAPENTIN 400 MG PO CAPS: 400.0000 mg | ORAL_CAPSULE | Freq: Three times a day (TID) | ORAL | 0 refills | Status: DC

## 2022-06-22 NOTE — Telephone Encounter (Signed)
Patient agreed to come in today she is in a lot of pain.

## 2022-06-22 NOTE — Progress Notes (Signed)
Referring Physician:  No referring provider defined for this encounter.  Primary Physician:  [name], MD  History of Present Illness: 06/22/2022 Rita Taylor returns to see me.  She is having severe pain down the left leg.  When I previously saw her, she was having some numbness and back pain but no significant leg pain.  She was also having a right-sided foot drop.  She has had an EMG in the meantime.  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  She is not having severe pain on the lateral aspect of her left thigh particular when she stands or walks. 03/30/2022 Rita Taylor is here today with a chief complaint of Right foot drop for about 5-6 months she has a foot brace from Dr Malvin Johns but has not had her EMG yet.  She reports that she had pain in her right leg extending from her buttock down her leg to her ankle but that stopped approximately 1 month ago when she began gabapentin.  Her pain is no longer an issue.  She has had 5 to 6 months of weakness in her right lower extremity.  She originally tried a less rigid immobilizer which did not help significantly.  She has since been put in an ankle-foot orthotic which has been very helpful.  She is also using a cane.  She had her original episode with back and leg pain approximately 8 years ago, after which she began intermittent exercises with improvement. She has since had a few courses of injections to help with back and leg pain.    Bowel/Bladder Dysfunction: none  Conservative measures:  Physical therapy: has participated at [facility] in 08/2021 for piriformis; has not participated in for foot drop. Multimodal medical therapy including regular antiinflammatories: celebrex, gabapentin, tramadol, medrol dosepack Injections: has not received epidural steroid injections 12/03/14: left SI joint injection at [facility] 07/13/18: left SI joint injection at [facility] 03/13/19: right SI joint  injection at [facility] 07/20/21: right hip injection (trochanteric bursa) at [facility] 08/13/21: right piriformis injection at [facility]  Past Surgery: none  Uvaldo Rising Volpi has no symptoms of cervical myelopathy.  The symptoms are causing a significant impact on the patient's life.   I have utilized the care everywhere function in epic to review the outside records available from external health systems.  Review of Systems:  A 10 point review of systems is negative, except for the pertinent positives and negatives detailed in the HPI.  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[facility])     Past Surgical History: Past Surgical History:  Procedure Laterality Date   bitubal ligation  1981   BREAST ENHANCEMENT SURGERY  1990   BROW LIFT Left 01/24/2020   Procedure: TARSORRHAPHY, LATERAL PLACEMENT LEFT LOWER LID;  Surgeon: Imagene Riches, MD;  Location: [facility];  Service: Ophthalmology;  Laterality: Left;   COLONOSCOPY     ECTROPION REPAIR Left 03/02/2019   Procedure: ECTROPION REPAIR, EXTENSIVE AND TARSORRHAPHY, LATERAL PLACEMENT OF LEFT LOWER LID;  Surgeon: Imagene Riches, MD;  Location: [facility];  Service: Ophthalmology;  Laterality: Left;   FACIAL COSMETIC SURGERY  GYNECOLOGIC CRYOSURGERY     15 years ago, normal since then, was treated with antibiotics, Annual pap smears for the past 20 years   TUMOR EXCISION Left 06/2018   ear    Allergies: Allergies as of 06/22/2022 - Review Complete 06/03/2022  Allergen Reaction Noted   Dextromethorphan-guaifenesin Other (See Comments) 11/25/2021   Fosamax [alendronate]  07/03/2012   Pseudoephedrine-dm-gg     Risedronate sodium  10/31/2010    Medications: No  outpatient medications have been marked as taking for the 06/22/22 encounter (Appointment) with Venetia Night, MD.    Social History: Social History   Tobacco Use   Smoking status: Never   Smokeless tobacco: Never  Vaping Use   Vaping Use: Never used  Substance Use Topics   Alcohol use: Yes    Alcohol/week: 1.0 standard drink of alcohol    Types: 1 Glasses of wine per week    Comment: moderate half a glass daily   Drug use: No    Family Medical History: Family History  Problem Relation Age of Onset   Hypertension Mother    High Cholesterol Mother    High Cholesterol Father    Hypertension Father    Hypertension Brother     Physical Examination: There were no vitals filed for this visit.   General: Patient is well developed, well nourished, calm, collected, and in no apparent distress. Attention to examination is appropriate.  Neck:   Supple.  Full range of motion.  Respiratory: Patient is breathing without any difficulty.   NEUROLOGICAL:     Awake, alert, oriented to person, place, and time.  Speech is clear and fluent.   Cranial Nerves: Pupils equal round and reactive to light.  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 Shoulder shrug is symmetric. Tongue protrusion is midline.  There is no pronator drift.  ROM of spine: full.    Strength: Side Biceps Triceps Deltoid Interossei Grip Wrist Ext. Wrist Flex.  R 5 5 5 5 5 5 5   L 5 5 5 5 5 5 5    Side Iliopsoas Quads Hamstring PF DF EHL  R 5 5 5  4- 2 2  L 5 5 5 5 5 5    Reflexes are 1+ and symmetric at the biceps, triceps, brachioradialis, patella and achilles.   Hoffman's is absent.     Bilateral upper and lower extremity sensation is intact to light touch.    No evidence of dysmetria noted.  Gait is untested due to pain.      Medical Decision Making  Imaging: MRI L spine 10/08/2021 IMPRESSION: 1. Multilevel lumbar disc and facet degeneration, most notable at L4-5  where there is severe facet arthrosis with grade 1 anterolisthesis, moderate spinal stenosis, and moderate left neural foraminal stenosis. 2. Mild-to-moderate spinal and moderate lateral recess stenosis at L3-4. 3. Mild-to-moderate neural foraminal stenosis at L3-4 and L5-S1. 4. Diffusely heterogeneous bone marrow signal which is nonspecific, however malignant/infiltrative processes can have this appearance in addition to benign processes (anemia, advancing age, and smoking). Correlate with CBC, and if the patient has a history of malignancy, consider obtaining a nuclear medicine whole-body bone scan for further evaluation. Laboratory evaluation for multiple myeloma could also be considered.     Electronically Signed   By: Sebastian Ache M.D.   On: 10/09/2021 12:55  I have personally reviewed the images and agree with the above interpretation.  EMG 04/05/2022 Impression: Abnormal study. 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   Thank you for the referral of this patient. It was our privilege to participate in care of your patient. Feel free to contact us with any further questions.  _____________________________ Theora Master, M.D.   Assessment and Plan: Rita Taylor is a pleasant 82 y.o. 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  She is having symptoms mostly on the left side.  She would like to try an epidural steroid injection.  I will increase her gabapentin to 400 mg 3 times daily.  I will see her back in 4 to 6 weeks.  If her pain is not better at that time, we will discuss L4-5 decompression.    I encouraged her to continue using her ankle-foot orthotic for her foot drop.  I spent a total of 10 minutes in this patient's care today. This time was spent reviewing pertinent records including imaging studies, obtaining and confirming  history, performing a directed evaluation, formulating and discussing my recommendations, and documenting the visit within the medical record.    Thank you for involving me in the care of this patient.      Ahja Martello K. Myer Haff MD, [facility] Neurosurgery

## 2022-06-22 NOTE — Telephone Encounter (Signed)
Pt called, had been taking 900 mg of the gabapentin with no pain but recently pain has increased and that dosage is no longer "cutting it" Is only able to sit without pain. Says she got desperate and took 1100 mg yesterday but could not tell a difference. Wanted to know if she should/could increase her dosage or if she should be taking something else.  Appt scheduled with Dr. Jeannie Fend 05/09 CB: [phone]

## 2022-06-23 ENCOUNTER — Telehealth: Payer: Self-pay | Admitting: Neurosurgery

## 2022-06-23 DIAGNOSIS — M48062 Spinal stenosis, lumbar region with neurogenic claudication: Secondary | ICD-10-CM

## 2022-06-23 NOTE — Telephone Encounter (Signed)
She has celebrex on her medication list from 06/15/22. Need to contact her to see if she is still taking this before we forward this message to Dr Myer Haff

## 2022-06-23 NOTE — Telephone Encounter (Signed)
Called [facility]  and spoke with Gaylyn Rong patient pick up Celebrex  on 06/18/22. Patient then called back and stated she has been taking the Gabapentin and Tylenol and Celebrex and it is not helping.  And she stated that she has to be in misery until her shot with Dr Cherylann Ratel. She has taken muscle relaxer in the past and has not helped.  Advised the patient will have to discus with provider and we will call her back

## 2022-06-23 NOTE — Telephone Encounter (Signed)
Left a message to verify her medication

## 2022-06-23 NOTE — Telephone Encounter (Signed)
Spoke with Rita Taylor she was not sure if she has Celebrex , she looked through her medications and did not have Celebrex on hand. Will contact the pharmacy to verify Celebrex. Patient stated she will call her PCP office regarding medication

## 2022-06-23 NOTE — Telephone Encounter (Signed)
Patient is calling that her back pain is worse than yesterday. Is there anything else besides Gabapentin 3x aday and tylenol that she can take to relieve some of the pain. She feels that she can not make it through to her appt with Dr.Lateef with out some help.

## 2022-06-24 ENCOUNTER — Ambulatory Visit: Payer: Medicare Other | Admitting: Neurosurgery

## 2022-06-24 MED ORDER — METHOCARBAMOL 500 MG PO TABS: 500.0000 mg | ORAL_TABLET | Freq: Three times a day (TID) | ORAL | 0 refills | Status: DC | PRN

## 2022-06-24 NOTE — Telephone Encounter (Signed)
Per Dr. Jeannie Fend we can send in Methocarbamol 500mg  to take 3 times a day, this has been sent to the pharmacy.   Can you please let her know that the medication has been sent in and to continue taking the celebrex and gabapentin.

## 2022-06-24 NOTE — Telephone Encounter (Signed)
Pt called this morning at 8:30 to f/u. Advised her that her request would be reviewed and we would get back to her with an answer.

## 2022-06-29 ENCOUNTER — Ambulatory Visit
Admission: RE | Admit: 2022-06-29 | Discharge: 2022-06-29 | Disposition: A | Discharge status: Home / Self Care | Payer: Medicare Other | Source: Ambulatory Visit | Attending: Student in an Organized Health Care Education/Training Program | Admitting: Student in an Organized Health Care Education/Training Program

## 2022-06-29 ENCOUNTER — Ambulatory Visit
Payer: Medicare Other | Attending: Student in an Organized Health Care Education/Training Program | Admitting: Student in an Organized Health Care Education/Training Program

## 2022-06-29 ENCOUNTER — Encounter: Payer: Self-pay | Admitting: Student in an Organized Health Care Education/Training Program

## 2022-06-29 VITALS — BP 145/83 | HR 102 | Temp 97.2°F | Resp 14 | Ht 62.0 in | Wt 109.0 lb

## 2022-06-29 DIAGNOSIS — M5416 Radiculopathy, lumbar region: Secondary | ICD-10-CM | POA: Diagnosis not present

## 2022-06-29 MED ORDER — SODIUM CHLORIDE 0.9% FLUSH
2.0000 mL | Freq: Once | INTRAVENOUS | Status: AC
  Administered 2022-06-29: 2 mL

## 2022-06-29 MED ORDER — DEXAMETHASONE SODIUM PHOSPHATE 10 MG/ML IJ SOLN
10.0000 mg | Freq: Once | INTRAMUSCULAR | Status: AC
  Administered 2022-06-29: 10 mg

## 2022-06-29 MED ORDER — IOHEXOL 180 MG/ML  SOLN
INTRAMUSCULAR | Status: AC
  Filled 2022-06-29: qty 20

## 2022-06-29 MED ORDER — DEXAMETHASONE SODIUM PHOSPHATE 10 MG/ML IJ SOLN
INTRAMUSCULAR | Status: AC
  Filled 2022-06-29: qty 1

## 2022-06-29 MED ORDER — ROPIVACAINE HCL 2 MG/ML IJ SOLN
2.0000 mL | Freq: Once | INTRAMUSCULAR | Status: AC
  Administered 2022-06-29: 2 mL via EPIDURAL

## 2022-06-29 MED ORDER — LIDOCAINE HCL (PF) 2 % IJ SOLN
INTRAMUSCULAR | Status: AC
  Filled 2022-06-29: qty 5

## 2022-06-29 MED ORDER — IOHEXOL 180 MG/ML  SOLN
10.0000 mL | Freq: Once | INTRAMUSCULAR | Status: AC
  Administered 2022-06-29: 10 mL via EPIDURAL

## 2022-06-29 MED ORDER — LIDOCAINE HCL 2 % IJ SOLN
20.0000 mL | Freq: Once | INTRAMUSCULAR | Status: AC
  Administered 2022-06-29: 400 mg

## 2022-06-29 MED ORDER — ROPIVACAINE HCL 2 MG/ML IJ SOLN
INTRAMUSCULAR | Status: AC
  Filled 2022-06-29: qty 20

## 2022-06-29 MED ORDER — SODIUM CHLORIDE (PF) 0.9 % IJ SOLN
INTRAMUSCULAR | Status: AC
  Filled 2022-06-29: qty 10

## 2022-06-29 NOTE — Patient Instructions (Signed)
____________________________________________________________________________________________  Post-Procedure Discharge Instructions  Instructions: Apply ice:  Purpose: This will minimize any swelling and discomfort after procedure.  When: Day of procedure, as soon as you get home. How: Fill a plastic sandwich bag with crushed ice. Cover it with a small towel and apply to injection site. How long: (15 min on, 15 min off) Apply for 15 minutes then remove x 15 minutes.  Repeat sequence on day of procedure, until you go to bed. Apply heat:  Purpose: To treat any soreness and discomfort from the procedure. When: Starting the next day after the procedure. How: Apply heat to procedure site starting the day following the procedure. How long: May continue to repeat daily, until discomfort goes away. Food intake: Start with clear liquids (like water) and advance to regular food, as tolerated.  Physical activities: Keep activities to a minimum for the first 8 hours after the procedure. After that, then as tolerated. Driving: If you have received any sedation, be responsible and do not drive. You are not allowed to drive for 24 hours after having sedation. Blood thinner: (Applies only to those taking blood thinners) You may restart your blood thinner 6 hours after your procedure. Insulin: (Applies only to Diabetic patients taking insulin) As soon as you can eat, you may resume your normal dosing schedule. Infection prevention: Keep procedure site clean and dry. Shower daily and clean area with soap and water. Post-procedure Pain Diary: Extremely important that this be done correctly and accurately. Recorded information will be used to determine the next step in treatment. For the purpose of accuracy, follow these rules: Evaluate only the area treated. Do not report or include pain from an untreated area. For the purpose of this evaluation, ignore all other areas of pain, except for the treated  area. After your procedure, avoid taking a long nap and attempting to complete the pain diary after you wake up. Instead, set your alarm clock to go off every hour, on the hour, for the initial 8 hours after the procedure. Document the duration of the numbing medicine, and the relief you are getting from it. Do not go to sleep and attempt to complete it later. It will not be accurate. If you received sedation, it is likely that you were given a medication that may cause amnesia. Because of this, completing the diary at a later time may cause the information to be inaccurate. This information is needed to plan your care. Follow-up appointment: Keep your post-procedure follow-up evaluation appointment after the procedure (usually 2 weeks for most procedures, 6 weeks for radiofrequencies). DO NOT FORGET to bring you pain diary with you.   Expect: (What should I expect to see with my procedure?) From numbing medicine (AKA: Local Anesthetics): Numbness or decrease in pain. You may also experience some weakness, which if present, could last for the duration of the local anesthetic. Onset: Full effect within 15 minutes of injected. Duration: It will depend on the type of local anesthetic used. On the average, 1 to 8 hours.  From steroids (Applies only if steroids were used): Decrease in swelling or inflammation. Once inflammation is improved, relief of the pain will follow. Onset of benefits: Depends on the amount of swelling present. The more swelling, the longer it will take for the benefits to be seen. In some cases, up to 10 days. Duration: Steroids will stay in the system x 2 weeks. Duration of benefits will depend on multiple posibilities including persistent irritating factors. Side-effects: If present, they   may typically last 2 weeks (the duration of the steroids). Frequent: Cramps (if they occur, drink Gatorade and take over-the-counter Magnesium 450-500 mg once to twice a day); water retention with  temporary weight gain; increases in blood sugar; decreased immune system response; increased appetite. Occasional: Facial flushing (red, warm cheeks); mood swings; menstrual changes. Uncommon: Long-term decrease or suppression of natural hormones; bone thinning. (These are more common with higher doses or more frequent use. This is why we prefer that our patients avoid having any injection therapies in other practices.)  Very Rare: Severe mood changes; psychosis; aseptic necrosis. From procedure: Some discomfort is to be expected once the numbing medicine wears off. This should be minimal if ice and heat are applied as instructed.  Call if: (When should I call?) You experience numbness and weakness that gets worse with time, as opposed to wearing off. New onset bowel or bladder incontinence. (Applies only to procedures done in the spine)  Emergency Numbers: Durning business hours (Monday - Thursday, 8:00 AM - 4:00 PM) (Friday, 9:00 AM - 12:00 Noon): [phone] After hours: [phone] NOTE: If you are having a problem and are unable connect with, or to talk to a provider, then go to your nearest urgent care or emergency department. If the problem is serious and urgent, please call 911. ____________________________________________________________________________________________  Epidural Steroid Injection  An epidural steroid injection is a shot of steroid medicine, also called cortisone, and a numbing medicine that is given into the epidural space. This space is between the spinal cord and the bones of the back. This shot helps relieve pain caused by an irritated or swollen nerve root. The pain relief you get from the injection depends on the cause of your condition and how long your pain lasts. You may have a period of slightly more pain after your injection, before the steroid medicine takes effect. This medicine usually starts working within 1-3 days. In some cases, you might need 7-10  days to feel the full effect. Tell your health care provider about: Any allergies you have. All medicines you are taking, including vitamins, herbs, eye drops, creams, and over-the-counter medicines. Any problems you or family members have had with anesthesia. Any bleeding problems you have. Any surgeries you have had. Any medical conditions you have. Whether you are pregnant or may be pregnant. What are the risks? Your health care provider will talk with you about risks. These may include: Headache. Bleeding. Infection. Allergic reaction to medicines or dyes. Nerve damage. Not being able to move (paralysis). This is rare. What happens before the procedure? Medicines You may be given medicines to lower anxiety. Ask your provider about: Changing or stopping your regular medicines. These include any diabetes medicines or blood thinners you take. Taking medicines such as aspirin and ibuprofen. These medicines can thin your blood. Do not take them unless your provider tells you to. Taking over-the-counter medicines, vitamins, herbs, and supplements. General instructions Follow instructions from your provider about what you may eat and drink. Ask your provider what steps will be taken to help prevent infection. If you will be going home right after the procedure, plan to have a responsible adult: Take you home from the hospital or clinic. You will not be allowed to drive. Care for you for the time you are told. What happens during the procedure?  An IV will be inserted into one of your veins. You may be given a sedative to help you relax. You will be asked to sit   or lie on your side. The injection site will be cleaned. An X-ray machine will be used to guide the needle close to the nerve that is causing pain. A needle will be put through your skin into the epidural space. This may cause you some discomfort. Contrast dye may be injected at the site to make sure that the steroid  medicine will be sent to the exact place it needs to go. The steroid medicine and a numbing medicine will be injected into the epidural space for pain relief. The needle will be removed. A bandage (dressing) will be put over the injection site. The procedure may vary among providers and hospitals. What happens after the procedure? Your blood pressure, heart rate, breathing rate, and blood oxygen level will be monitored until you leave the hospital or clinic. Your IV will be removed. Your arm or leg may feel weak or numb for a few hours. This information is not intended to replace advice given to you by your health care provider. Make sure you discuss any questions you have with your health care provider. Document Revised: 09/11/2021 Document Reviewed: 09/11/2021 Elsevier Patient Education  2023 [employer]  

## 2022-06-29 NOTE — Progress Notes (Signed)
PROVIDER NOTE: Interpretation of information contained herein should be left to medically-trained personnel. Specific patient instructions are provided elsewhere under "Patient Instructions" section of medical record. This document was created in part using STT-dictation technology, any transcriptional errors that may result from this process are unintentional.  Patient: Rita Taylor Type: Established DOB: [birth year] MRN: [id number] PCP: [name], MD  Service: Procedure DOS: 06/29/2022 Setting: Ambulatory Location: Ambulatory outpatient facility Delivery: Face-to-face Provider: Edward Jolly, MD Specialty: Interventional Pain Management Specialty designation: 09 Location: Outpatient facility Ref. Prov.: [name], MD       Interventional Therapy   Procedure: Lumbar epidural steroid injection (LESI) (interlaminar) #1    Laterality: Left   Level:  L4-5 Level.  Imaging: Fluoroscopic guidance         Anesthesia: Local anesthesia (1-2% Lidocaine) DOS: 06/29/2022  Performed by: Edward Jolly, MD  Purpose: Diagnostic/Therapeutic Indications: Lumbar radicular pain of intraspinal etiology of more than 4 weeks that has failed to respond to conservative therapy and is severe enough to impact quality of life or function. 1. Lumbar radiculopathy    NAS-11 Pain score:   Pre-procedure: 10-Worst pain ever/10   Post-procedure: 7/10      Position / Prep / Materials:  Position: Prone w/ head of the table raised (slight reverse trendelenburg) to facilitate breathing.  Prep solution: DuraPrep (Iodine Povacrylex [0.7% available iodine] and Isopropyl Alcohol, 74% w/w) Prep Area: Entire Posterior Lumbar Region from lower scapular tip down to mid buttocks area and from flank to flank. Materials:  Tray: Epidural tray Needle(s):  Type: Epidural needle (Tuohy) Gauge (G):  17 Length: Regular (3.5-in) Qty: 1   Pre-op H&P Assessment:  Ms. Shelden is a 82 y.o. (year old), female patient,  seen today for interventional treatment. She  has a past surgical history that includes Gynecologic cryosurgery; Breast enhancement surgery (1990); bitubal ligation (1981); Colonoscopy; Facial cosmetic surgery; Tumor excision (Left, 06/2018); Ectropion repair (Left, 03/02/2019); and Brow lift (Left, 01/24/2020). Ms. Kleinpeter has a current medication list which includes the following prescription(s): amlodipine, amlodipine, aspirin, biotin w/ vitamins c & e, bupropion, calcium 1200, celecoxib, vitamin d-3, cyanocobalamin, denosumab, erythromycin, gabapentin, losartan, methocarbamol, and pravastatin. Her primarily concern today is the Hip Pain (Left)  Initial Vital Signs:  Pulse/HCG Rate: 92  Temp: (!) 97.2 F (36.2 C) Resp: 18 BP: (!) 143/42 SpO2: 100 %  BMI: Estimated body mass index is 19.94 kg/m as calculated from the following:   Height as of this encounter: 5\' 2"  (1.575 m).   Weight as of this encounter: 109 lb (49.4 kg).  Risk Assessment: Allergies: Reviewed. She is allergic to dextromethorphan-guaifenesin, fosamax [alendronate], pseudoephedrine-dm-gg, and risedronate sodium.  Allergy Precautions: None required Coagulopathies: Reviewed. None identified.  Blood-thinner therapy: None at this time Active Infection(s): Reviewed. None identified. Ms. Boggess is afebrile  Site Confirmation: Ms. Virgilio was asked to confirm the procedure and laterality before marking the site Procedure checklist: Completed Consent: Before the procedure and under the influence of no sedative(s), amnesic(s), or anxiolytics, the patient was informed of the treatment options, risks and possible complications. To fulfill our ethical and legal obligations, as recommended by the American Medical Association's Code of Ethics, I have informed the patient of my clinical impression; the nature and purpose of the treatment or procedure; the risks, benefits, and possible complications of the intervention; the alternatives,  including doing nothing; the risk(s) and benefit(s) of the alternative treatment(s) or procedure(s); and the risk(s) and benefit(s) of doing nothing. The patient was provided  information about the general risks and possible complications associated with the procedure. These may include, but are not limited to: failure to achieve desired goals, infection, bleeding, organ or nerve damage, allergic reactions, paralysis, and death. In addition, the patient was informed of those risks and complications associated to Spine-related procedures, such as failure to decrease pain; infection (i.e.: Meningitis, epidural or intraspinal abscess); bleeding (i.e.: epidural hematoma, subarachnoid hemorrhage, or any other type of intraspinal or peri-dural bleeding); organ or nerve damage (i.e.: Any type of peripheral nerve, nerve root, or spinal cord injury) with subsequent damage to sensory, motor, and/or autonomic systems, resulting in permanent pain, numbness, and/or weakness of one or several areas of the body; allergic reactions; (i.e.: anaphylactic reaction); and/or death. Furthermore, the patient was informed of those risks and complications associated with the medications. These include, but are not limited to: allergic reactions (i.e.: anaphylactic or anaphylactoid reaction(s)); adrenal axis suppression; blood sugar elevation that in diabetics may result in ketoacidosis or comma; water retention that in patients with history of congestive heart failure may result in shortness of breath, pulmonary edema, and decompensation with resultant heart failure; weight gain; swelling or edema; medication-induced neural toxicity; particulate matter embolism and blood vessel occlusion with resultant organ, and/or nervous system infarction; and/or aseptic necrosis of one or more joints. Finally, the patient was informed that Medicine is not an exact science; therefore, there is also the possibility of unforeseen or unpredictable risks  and/or possible complications that may result in a catastrophic outcome. The patient indicated having understood very clearly. We have given the patient no guarantees and we have made no promises. Enough time was given to the patient to ask questions, all of which were answered to the patient's satisfaction. Ms. Schaedler has indicated that she wanted to continue with the procedure. Attestation: I, the ordering provider, attest that I have discussed with the patient the benefits, risks, side-effects, alternatives, likelihood of achieving goals, and potential problems during recovery for the procedure that I have provided informed consent. Date  Time: 06/29/2022  9:49 AM   Pre-Procedure Preparation:  Monitoring: As per clinic protocol. Respiration, ETCO2, SpO2, BP, heart rate and rhythm monitor placed and checked for adequate function Safety Precautions: Patient was assessed for positional comfort and pressure points before starting the procedure. Time-out: I initiated and conducted the "Time-out" before starting the procedure, as per protocol. The patient was asked to participate by confirming the accuracy of the "Time Out" information. Verification of the correct person, site, and procedure were performed and confirmed by me, the nursing staff, and the patient. "Time-out" conducted as per Joint Commission's Universal Protocol (UP.01.01.01). Time: 1051 Start Time: 1052 hrs.  Description/Narrative of Procedure:          Target: Epidural space via interlaminar opening, initially targeting the lower laminar border of the superior vertebral body. Region: Lumbar Approach: Percutaneous paravertebral  Rationale (medical necessity): procedure needed and proper for the diagnosis and/or treatment of the patient's medical symptoms and needs. Procedural Technique Safety Precautions: Aspiration looking for blood return was conducted prior to all injections. At no point did we inject any substances, as a needle was  being advanced. No attempts were made at seeking any paresthesias. Safe injection practices and needle disposal techniques used. Medications properly checked for expiration dates. SDV (single dose vial) medications used. Description of the Procedure: Protocol guidelines were followed. The procedure needle was introduced through the skin, ipsilateral to the reported pain, and advanced to the target area. Bone was contacted and  the needle walked caudad, until the lamina was cleared. The epidural space was identified using "loss-of-resistance technique" with 2-3 ml of PF-NaCl (0.9% NSS), in a 5cc LOR glass syringe.  5 cc solution made of 2 cc of preservative-free saline, 2 cc of 0.2% ropivacaine, 1 cc of Decadron 10 mg/cc.   Vitals:   06/29/22 0954 06/29/22 1050 06/29/22 1055 06/29/22 1100  BP: (!) 143/42 (!) 172/84 (!) 159/84 (!) 145/83  Pulse: 92 (!) 102 (!) 105 (!) 102  Resp: 18 13 16 14   Temp: (!) 97.2 F (36.2 C)     TempSrc: Temporal     SpO2: 100% 100% 100% 100%  Weight: 109 lb (49.4 kg)     Height: 5\' 2"  (1.575 m)       Start Time: 1052 hrs. End Time: 1100 hrs.  Imaging Guidance (Spinal):          Type of Imaging Technique: Fluoroscopy Guidance (Spinal) Indication(s): Assistance in needle guidance and placement for procedures requiring needle placement in or near specific anatomical locations not easily accessible without such assistance. Exposure Time: Please see nurses notes. Contrast: Before injecting any contrast, we confirmed that the patient did not have an allergy to iodine, shellfish, or radiological contrast. Once satisfactory needle placement was completed at the desired level, radiological contrast was injected. Contrast injected under live fluoroscopy. No contrast complications. See chart for type and volume of contrast used. Fluoroscopic Guidance: I was personally present during the use of fluoroscopy. "Tunnel Vision Technique" used to obtain the best possible view of the  target area. Parallax error corrected before commencing the procedure. "Direction-depth-direction" technique used to introduce the needle under continuous pulsed fluoroscopy. Once target was reached, antero-posterior, oblique, and lateral fluoroscopic projection used confirm needle placement in all planes. Images permanently stored in EMR. Interpretation: I personally interpreted the imaging intraoperatively. Adequate needle placement confirmed in multiple planes. Appropriate spread of contrast into desired area was observed. No evidence of afferent or efferent intravascular uptake. No intrathecal or subarachnoid spread observed. Permanent images saved into the patient's record.  Antibiotic Prophylaxis:   Anti-infectives (From admission, onward)    None      Indication(s): None identified  Post-operative Assessment:  Post-procedure Vital Signs:  Pulse/HCG Rate: (!) 102  Temp: (!) 97.2 F (36.2 C) Resp: 14 BP: (!) 145/83 SpO2: 100 %  EBL: None  Complications: No immediate post-treatment complications observed by team, or reported by patient.  Note: The patient tolerated the entire procedure well. A repeat set of vitals were taken after the procedure and the patient was kept under observation following institutional policy, for this type of procedure. Post-procedural neurological assessment was performed, showing return to baseline, prior to discharge. The patient was provided with post-procedure discharge instructions, including a section on how to identify potential problems. Should any problems arise concerning this procedure, the patient was given instructions to immediately contact us, at any time, without hesitation. In any case, we plan to contact the patient by telephone for a follow-up status report regarding this interventional procedure.  Comments:  No additional relevant information.  Plan of Care (POC)  Orders:  Orders Placed This Encounter  Procedures   DG PAIN CLINIC  C-ARM 1-60 MIN NO REPORT    Intraoperative interpretation by procedural physician at [facility].    Standing Status:   Standing    Number of Occurrences:   1    Order Specific Question:   Reason for exam:    Answer:   Assistance  in needle guidance and placement for procedures requiring needle placement in or near specific anatomical locations not easily accessible without such assistance.     Medications ordered for procedure: Meds ordered this encounter  Medications   iohexol (OMNIPAQUE) 180 MG/ML injection 10 mL    Must be Myelogram-compatible. If not available, you may substitute with a water-soluble, non-ionic, hypoallergenic, myelogram-compatible radiological contrast medium.   lidocaine (XYLOCAINE) 2 % (with pres) injection 400 mg   sodium chloride flush (NS) 0.9 % injection 2 mL   ropivacaine (PF) 2 mg/mL (0.2%) (NAROPIN) injection 2 mL   dexamethasone (DECADRON) injection 10 mg   Medications administered: We administered iohexol, lidocaine, sodium chloride flush, ropivacaine (PF) 2 mg/mL (0.2%), and dexamethasone.  See the medical record for exact dosing, route, and time of administration.  Follow-up plan:   Return in about 20 days (around 07/19/2022) for Post Procedure Evaluation, virtual.      Recent Visits No visits were found meeting these conditions. Showing recent visits within past 90 days and meeting all other requirements Today's Visits Date Type Provider Dept  06/29/22 Procedure visit Edward Jolly, MD [facility]  Showing today's visits and meeting all other requirements Future Appointments Date Type Provider Dept  07/19/22 Appointment Edward Jolly, MD [facility]  Showing future appointments within next 90 days and meeting all other requirements  Disposition: Discharge home  Discharge (Date  Time): 06/29/2022; 1111 hrs.   Primary Care Physician: [name], MD Location: [facility] Note  by: Edward Jolly, MD (TTS technology used. I apologize for any typographical errors that were not detected and corrected.) Date: 06/29/2022; Time: 11:32 AM  Disclaimer:  Medicine is not an Visual merchandiser. The only guarantee in medicine is that nothing is guaranteed. It is important to note that the decision to proceed with this intervention was based on the information collected from the patient. The Data and conclusions were drawn from the patient's questionnaire, the interview, and the physical examination. Because the information was provided in large part by the patient, it cannot be guaranteed that it has not been purposely or unconsciously manipulated. Every effort has been made to obtain as much relevant data as possible for this evaluation. It is important to note that the conclusions that lead to this procedure are derived in large part from the available data. Always take into account that the treatment will also be dependent on availability of resources and existing treatment guidelines, considered by other Pain Management Practitioners as being common knowledge and practice, at the time of the intervention. For Medico-Legal purposes, it is also important to point out that variation in procedural techniques and pharmacological choices are the acceptable norm. The indications, contraindications, technique, and results of the above procedure should only be interpreted and judged by a Board-Certified Interventional Pain Specialist with extensive familiarity and expertise in the same exact procedure and technique.

## 2022-06-29 NOTE — Progress Notes (Signed)
PROVIDER NOTE: Information contained herein reflects review and annotations entered in association with encounter. Interpretation of such information and data should be left to medically-trained personnel. Information provided to patient can be located elsewhere in the medical record under "Patient Instructions". Document created using STT-dictation technology, any transcriptional errors that may result from process are unintentional.    Patient: Rita Taylor  Service: Procedure (FAST-TRACK REFERRAL)  Provider: Edward Jolly, MD  DOB: [birth year]  DOS: 06/29/2022  Specialty: Interventional Pain Management  MRN: [id number]  Setting: Ambulatory outpatient  Referring Provider: [name], MD  Type: New Patient  Location: Pain clinic facility  office  PCP: [name], MD  NOTE: FAST-TRACK referrals are offered to accelerate the care of patients in the need of specific interventional pain management therapies. This type of referral does not include non-interventional pain management options.   Procedure: Left L4/5 ESI  Primary Reason for Visit: Interventional Pain Management Treatment. CC: Hip Pain (Left)  HPI  Ms. Codispoti is a 82 y.o. year old, female patient, who comes today for a  "Fast-Track" new patient evaluation, as requested by [name], MD. The patient has been made aware that this type of referral option is reserved for the Interventional Pain Management portion of our practice and completely excludes the option of medication management. Her primarily concern today is the Hip Pain (Left)  Pain Assessment: Location: Left Hip Radiating: Radiates from left hip to left outer and inner thigh down to left knee Onset: 1 to 4 weeks ago Duration: Chronic pain Quality: Constant, Aching, Shooting Severity: 10-Worst pain ever/10 (subjective, self-reported pain score)  Effect on ADL: "Hard to be comfortable anywhere now and very limiting" Timing: Constant Modifying factors: Denie  s BP: (!) 143/42  HR: 92  Onset and Duration: Sudden, Date of onset: ~06/15/22, and Present less than 3 months Cause of pain: Unknown Severity: Getting worse, NAS-11 at its worse: 10/10, NAS-11 at its best: 8/10, NAS-11 now: 10/10, and NAS-11 on the average: 8/10 Timing: Not influenced by the time of the day and During activity or exercise Aggravating Factors: Bending, Motion, Prolonged standing, and Walking Alleviating Factors: Resting and Sitting Associated Problems: Depression, Personality changes, Sadness, and Spasms Quality of Pain: Constant, Sharp, and Shooting Previous Examinations or Tests: X-rays Previous Treatments: The patient denies recent treatment  The patient comes into the clinics today, referred to [place] for a lumbar epidural steroid injection by Dr. Marcell Barlow.  Of note the patient is having severe pain radiating down her left thigh towards the medial portion.  She is also having left-sided hip pain and buttock pain.  She she has had left SI joint injections with [facility] but states that this pain is different than what she was being treated for at [facility] in regards to her SI joint.  She sees neurology as well.  She states that this radiating leg pain started approximately 6 weeks ago, no inciting or traumatic event.  She presents today in a wheelchair and states that she has significant difficulty with activities of daily living and ambulating.  Meds   Current Outpatient Medications:    amLODipine (NORVASC) 10 MG tablet, Take 1 tablet (10 mg total) by mouth daily., Disp: 90 tablet, Rfl: 1   amLODipine (NORVASC) 5 MG tablet, TAKE 1 TABLET BY MOUTH DAILY, Disp: 90 tablet, Rfl: 1   ASPIRIN 81 PO, Take by mouth daily., Disp: , Rfl:    Biotin w/ Vitamins C & E (HAIR/SKIN/NAILS PO), Take by mouth daily.,  Disp: , Rfl:    buPROPion (WELLBUTRIN XL) 150 MG 24 hr tablet, TAKE 1 TABLET(150 MG) BY MOUTH DAILY, Disp: 90 tablet, Rfl: 1   Calcium Carbonate-Vit D-Min (CALCIUM 1200)  1200-1000 MG-UNIT CHEW, Chew 1 tablet by mouth daily., Disp: , Rfl:    celecoxib (CELEBREX) 100 MG capsule, TAKE 1 CAPSULE(100 MG) BY MOUTH TWICE DAILY, Disp: 180 capsule, Rfl: 0   Cholecalciferol (VITAMIN D-3) 25 MCG (1000 UT) CAPS, Take by mouth., Disp: , Rfl:    cyanocobalamin (VITAMIN B12) 1000 MCG tablet, Take 1 tablet (1,000 mcg total) by mouth daily., Disp: 90 tablet, Rfl: 1   denosumab (PROLIA) 60 MG/ML SOSY injection, Inject into the skin., Disp: , Rfl:    erythromycin ophthalmic ointment, Apply to sutures 4 times a day for 10-12 days.  Discontinue if allergy develops and call our office (Patient taking differently: Apply to sutures 2 times a day for 10-12 days.  Discontinue if allergy develops and call our office), Disp: 3.5 g, Rfl: 2   gabapentin (NEURONTIN) 400 MG capsule, Take 1 capsule (400 mg total) by mouth 3 (three) times daily., Disp: 180 capsule, Rfl: 0   losartan (COZAAR) 100 MG tablet, TAKE 1 TABLET(100 MG) BY MOUTH DAILY, Disp: 90 tablet, Rfl: 1   methocarbamol (ROBAXIN) 500 MG tablet, Take 1 tablet (500 mg total) by mouth 3 (three) times daily as needed for muscle spasms., Disp: 60 tablet, Rfl: 0   pravastatin (PRAVACHOL) 40 MG tablet, TAKE 1 TABLET(40 MG) BY MOUTH DAILY, Disp: 90 tablet, Rfl: 3  Imaging Review   MR Lumbar Spine Wo Contrast  Narrative CLINICAL DATA:  Lumbar radiculopathy. Bilateral foot drop and leg numbness.  EXAM: MRI LUMBAR SPINE WITHOUT CONTRAST  TECHNIQUE: Multiplanar, multisequence MR imaging of the lumbar spine was performed. No intravenous contrast was administered.  COMPARISON:  Lumbar spine radiographs 09/10/2021  FINDINGS: Segmentation:  Standard.  Alignment: Mild lumbar dextroscoliosis. Trace retrolisthesis of L2 on L3 and trace anterolisthesis of L4 on L5.  Vertebrae: No fracture or significant marrow edema. Diffusely heterogeneous bone marrow signal with patchy T1 hypointensity throughout the lumbar spine.  Conus medullaris  and cauda equina: Conus extends to the upper L1 level. Conus and cauda equina appear normal.  Paraspinal and other soft tissues: Small left renal cysts measuring up to 1 cm with no follow-up imaging recommended.  Disc levels:  Disc desiccation throughout the lumbar spine with only at most mild disc space narrowing.  T12-L1: Negative.  L1-2: Mild disc bulging and mild facet hypertrophy without stenosis.  L2-3: Disc bulging and moderate left greater than right facet and ligamentum flavum hypertrophy result in mild left greater than right lateral recess and neural foraminal stenosis without spinal stenosis.  L3-4: Disc bulging, a left foraminal disc protrusion, and moderate facet and ligamentum flavum hypertrophy result in mild-to-moderate spinal stenosis, moderate bilateral lateral recess stenosis, and mild right and moderate left neural foraminal stenosis. Potential left L3 and bilateral L4 nerve root impingement.  L4-5: Anterolisthesis with bulging uncovered disc and severe facet and ligamentum flavum hypertrophy result in moderate spinal stenosis, moderate bilateral lateral recess stenosis, and mild right and moderate left neural foraminal stenosis. Potential left L4 and bilateral L5 nerve root impingement.  L5-S1: Disc bulging and mild facet hypertrophy result in mild right greater than left lateral recess stenosis and mild-to-moderate right and mild left neural foraminal stenosis without spinal stenosis.  IMPRESSION: 1. Multilevel lumbar disc and facet degeneration, most notable at L4-5 where there is severe facet arthrosis  with grade 1 anterolisthesis, moderate spinal stenosis, and moderate left neural foraminal stenosis. 2. Mild-to-moderate spinal and moderate lateral recess stenosis at L3-4. 3. Mild-to-moderate neural foraminal stenosis at L3-4 and L5-S1. 4. Diffusely heterogeneous bone marrow signal which is nonspecific, however malignant/infiltrative processes can  have this appearance in addition to benign processes (anemia, advancing age, and smoking). Correlate with CBC, and if the patient has a history of malignancy, consider obtaining a nuclear medicine whole-body bone scan for further evaluation. Laboratory evaluation for multiple myeloma could also be considered.   Electronically Signed By: Sebastian Ache M.D. On: 10/09/2021 12:55  DG Lumbar Spine Complete  Narrative CLINICAL DATA:  Right-sided back pain  EXAM: LUMBAR SPINE - COMPLETE 4+ VIEW  COMPARISON:  CT 03/09/2019  FINDINGS: There is no evidence of lumbar spine fracture. Slight thoracolumbar curve. Multiple low-grade vertebral body subluxations unchanged from 03/07/2019. Disc space height is maintained. Mild lumbar spondylosis with prominent anterior osteophytes at L3 and L4. Moderate lower lumbar facet arthropathy. Aortic atherosclerotic calcification.  IMPRESSION: No acute findings in the lumbar spine. Degenerative spondylosis and facet arthropathy.   Electronically Signed By: Minerva Fester M.D. On: 09/11/2021 08:57  Complexity Note: Imaging results reviewed.                         ROS  Cardiovascular: Daily Aspirin intake and High blood pressure (takes ASA 81mg  daily) Pulmonary or Respiratory: No reported pulmonary signs or symptoms such as wheezing and difficulty taking a deep full breath (Asthma), difficulty blowing air out (Emphysema), coughing up mucus (Bronchitis), persistent dry cough, or temporary stoppage of breathing during sleep Neurological: No reported neurological signs or symptoms such as seizures, abnormal skin sensations, urinary and/or fecal incontinence, being born with an abnormal open spine and/or a tethered spinal cord Psychological-Psychiatric: Depressed Gastrointestinal: No reported gastrointestinal signs or symptoms such as vomiting or evacuating blood, reflux, heartburn, alternating episodes of diarrhea and constipation, inflamed or scarred  liver, or pancreas or irrregular and/or infrequent bowel movements Genitourinary: No reported renal or genitourinary signs or symptoms such as difficulty voiding or producing urine, peeing blood, non-functioning kidney, kidney stones, difficulty emptying the bladder, difficulty controlling the flow of urine, or chronic kidney disease Hematological: No reported hematological signs or symptoms such as prolonged bleeding, low or poor functioning platelets, bruising or bleeding easily, hereditary bleeding problems, low energy levels due to low hemoglobin or being anemic Endocrine: No reported endocrine signs or symptoms such as high or low blood sugar, rapid heart rate due to high thyroid levels, obesity or weight gain due to slow thyroid or thyroid disease Rheumatologic: No reported rheumatological signs and symptoms such as fatigue, joint pain, tenderness, swelling, redness, heat, stiffness, decreased range of motion, with or without associated rash Musculoskeletal: Negative for myasthenia gravis, muscular dystrophy, multiple sclerosis or malignant hyperthermia Work History: Retired  Allergies  Ms. Devan is allergic to dextromethorphan-guaifenesin, fosamax [alendronate], pseudoephedrine-dm-gg, and risedronate sodium.  Laboratory Chemistry Profile   Renal Lab Results  Component Value Date   BUN 20 03/17/2022   CREATININE 0.79 03/17/2022   GFR 69.70 03/17/2022   GFRAA 79 07/01/2014   GFRNONAA >60 10/28/2021     Electrolytes Lab Results  Component Value Date   NA 140 03/17/2022   K 3.9 03/17/2022   CL 102 03/17/2022   CALCIUM 9.1 03/17/2022     Hepatic Lab Results  Component Value Date   AST 23 03/17/2022   ALT 19 03/17/2022   ALBUMIN 4.4  03/17/2022   ALKPHOS 44 03/17/2022     ID Lab Results  Component Value Date   SARSCOV2NAA NEGATIVE 01/22/2020   HCVAB NEGATIVE 01/06/2015     Bone Lab Results  Component Value Date   VD25OH 45.05 03/10/2020     Endocrine Lab Results   Component Value Date   GLUCOSE 93 03/17/2022   HGBA1C 6.2 03/17/2022   TSH 1.18 10/19/2017     Neuropathy Lab Results  Component Value Date   VITAMINB12 1,043 (H) 02/22/2022   FOLATE 13.4 02/22/2022   HGBA1C 6.2 03/17/2022     CNS No results found for: "COLORCSF", "APPEARCSF", "RBCCOUNTCSF", "WBCCSF", "POLYSCSF", "LYMPHSCSF", "EOSCSF", "PROTEINCSF", "GLUCCSF", "JCVIRUS", "CSFOLI", "IGGCSF", "LABACHR", "ACETBL"   Inflammation (CRP: Acute  ESR: Chronic) No results found for: "CRP", "ESRSEDRATE", "LATICACIDVEN"   Rheumatology No results found for: "RF", "ANA", "LABURIC", "URICUR", "LYMEIGGIGMAB", "LYMEABIGMQN", "HLAB27"   Coagulation Lab Results  Component Value Date   PLT 192 02/22/2022     Cardiovascular Lab Results  Component Value Date   HGB 11.1 (L) 02/22/2022   HCT 33.5 (L) 02/22/2022     Screening Lab Results  Component Value Date   SARSCOV2NAA NEGATIVE 01/22/2020   HCVAB NEGATIVE 01/06/2015     Cancer No results found for: "CEA", "CA125", "LABCA2"   Allergens No results found for: "ALMOND", "APPLE", "ASPARAGUS", "AVOCADO", "BANANA", "BARLEY", "BASIL", "BAYLEAF", "GREENBEAN", "LIMABEAN", "WHITEBEAN", "BEEFIGE", "REDBEET", "BLUEBERRY", "BROCCOLI", "CABBAGE", "MELON", "CARROT", "CASEIN", "CASHEWNUT", "CAULIFLOWER", "CELERY"     Note: Lab results reviewed.  PFSH  Drug: Ms. Hawksworth  reports no history of drug use. Alcohol:  reports current alcohol use of about 1.0 standard drink of alcohol per week. Tobacco:  reports that she has never smoked. She has never used smokeless tobacco. Medical:  has a past medical history of History of shingles, Hyperlipidemia, Hypertension, Melanoma (HCC) (2008), Neurofibromatosis type II (HCC), Postoperative anemia (07/22/2018), and 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(HCC). Family: family history includes High Cholesterol in her father and mother; Hypertension in her brother, father, and mother.  Past Surgical History:  Procedure Laterality  Date   bitubal ligation  1981   BREAST ENHANCEMENT SURGERY  1990   BROW LIFT Left 01/24/2020   Procedure: TARSORRHAPHY, LATERAL PLACEMENT LEFT LOWER LID;  Surgeon: Imagene Riches, MD;  Location: [facility];  Service: Ophthalmology;  Laterality: Left;   COLONOSCOPY     ECTROPION REPAIR Left 03/02/2019   Procedure: ECTROPION REPAIR, EXTENSIVE AND TARSORRHAPHY, LATERAL PLACEMENT OF LEFT LOWER LID;  Surgeon: Imagene Riches, MD;  Location: [facility];  Service: Ophthalmology;  Laterality: Left;   FACIAL COSMETIC SURGERY     GYNECOLOGIC CRYOSURGERY     15 years ago, normal since then, was treated with antibiotics, Annual pap smears for the past 20 years   TUMOR EXCISION Left 06/2018   ear   Active Ambulatory Problems    Diagnosis Date Noted   Screening for cervical cancer 10/14/2011   Hypertension 11/15/2011   History of melanoma 11/08/2012   Osteoporosis, 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   Routine general medical examination at a health care facility 01/06/2015   Piriformis syndrome of left side 01/27/2015   Hyperlipidemia 07/10/2015   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   Distal radial fracture 07/16/2017   DM type 2, goal HbA1c < 7% (HCC) 01/21/2018   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   Hydroureter 02/14/2019   Hydronephrosis 02/14/2019   Degenerative disc disease, cervical 02/14/2019   Degenerative disc disease, lumbar 02/14/2019  Multiple lung nodules on CT 08/19/2019   Coronary atherosclerosis of native coronary artery 08/19/2019   Abdominal aortic atherosclerosis (HCC) 08/19/2019   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 09/11/2020   Chronic bilateral back pain 02/08/2019   Disorder of breast implant 06/10/2017   Paralytic ectropion of left upper eyelid 10/27/2018   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   Ruptured silicone breast implant 06/10/2017   Sacroiliac joint pain 01/08/2015   Nausea  and vomiting 02/11/2021   Major depressive disorder with current active episode 07/05/2021   Foot drop, right 09/10/2021   Right leg pain 09/10/2021   Spinal stenosis, 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   Normocytic anemia 10/28/2021   Alcohol use 10/28/2021   Ptosis of left eyelid 04/21/2021   Myofascial pain 08/13/2021   Muscle weakness 09/03/2021   Right sided sciatica 07/29/2021   Myofascial pain syndrome 08/13/2021   Dysphagia 03/17/2022   Resolved Ambulatory Problems    Diagnosis Date Noted   Encounter for Medicare annual wellness exam 10/14/2011   Hyponatremia 11/15/2011   Periorbital edema 07/03/2012   Essential hypertension 06/17/2014   Loss of weight 07/06/2014   Bursitis of left hip 11/22/2014   Sacroiliitis (HCC) 01/06/2015   Colon cancer screening 01/16/2017   Breast cancer screening 01/16/2017   Fecal soiling 01/21/2018   Postoperative anemia 07/22/2018   Central abdominal mass 07/22/2018   Anemia 02/14/2019   Generalized anxiety disorder 02/19/2020   Right leg swelling 10/28/2021   Past Medical History:  Diagnosis Date   History of shingles    Melanoma (HCC) 2008   Constitutional Exam  General appearance: Well nourished, well developed, and well hydrated. In no apparent acute distress Vitals:   06/29/22 0954  BP: (!) 143/42  Pulse: 92  Resp: 18  Temp: (!) 97.2 F (36.2 C)  TempSrc: Temporal  SpO2: 100%  Weight: 109 lb (49.4 kg)  Height: 5\' 2"  (1.575 m)   BMI Assessment: Estimated body mass index is 19.94 kg/m as calculated from the following:   Height as of this encounter: 5\' 2"  (1.575 m).   Weight as of this encounter: 109 lb (49.4 kg).  BMI interpretation table: BMI level Category Range association with higher incidence of chronic pain  <18 kg/m2 Underweight   18.5-24.9 kg/m2 Ideal body weight   25-29.9 kg/m2 Overweight Increased incidence by 20%  30-34.9 kg/m2 Obese (Class I) Increased incidence by 68%  35-39.9 kg/m2  Severe obesity (Class II) Increased incidence by 136%  >40 kg/m2 Extreme obesity (Class III) Increased incidence by 254%   Patient's current BMI Ideal Body weight  Body mass index is 19.94 kg/m. Ideal body weight: 50.1 kg (110 lb 7.2 oz)   BMI Readings from Last 4 Encounters:  06/29/22 19.94 kg/m  06/22/22 19.31 kg/m  06/03/22 20.70 kg/m  03/30/22 20.81 kg/m   Wt Readings from Last 4 Encounters:  06/29/22 109 lb (49.4 kg)  06/22/22 109 lb (49.4 kg)  06/03/22 115 lb (52.2 kg)  03/30/22 115 lb 9.6 oz (52.4 kg)    Psych/Mental status: Alert, oriented x 3 (person, place, & time)       Eyes: PERLA Respiratory: No evidence of acute respiratory distress  Lumbar Exam  Skin & Axial Inspection: No masses, redness, or swelling Alignment: Symmetrical Functional ROM: Pain restricted ROM affecting primarily the left Stability: No instability detected Muscle Tone/Strength: Functionally intact. No obvious neuro-muscular anomalies detected. Sensory (Neurological): Dermatomal pain pattern Palpation: Complains of area being tender to palpation  Provocative Tests: Hyperextension/rotation test: deferred today       Lateral bending test: (+) ipsilateral radicular pain, on the left. Positive for left-sided foraminal stenosis.   Gait & Posture Assessment  Ambulation: Patient came in today in a wheel chair Gait: Significantly limited. Dependent on assistive device to ambulate Posture: Difficulty standing up straight, due to pain   Lower Extremity Exam    Side: Right lower extremity  Side: Left lower extremity  Stability: No instability observed          Stability: No instability observed          Skin & Extremity Inspection: Skin color, temperature, and hair growth are WNL. No peripheral edema or cyanosis. No masses, redness, swelling, asymmetry, or associated skin lesions. No contractures.  Skin & Extremity Inspection: Skin color, temperature, and hair growth are WNL. No peripheral  edema or cyanosis. No masses, redness, swelling, asymmetry, or associated skin lesions. No contractures.  Functional ROM: Unrestricted ROM                  Functional ROM: Pain restricted ROM for hip and knee joints          Muscle Tone/Strength: Functionally intact. No obvious neuro-muscular anomalies detected.  Muscle Tone/Strength: Functionally intact. No obvious neuro-muscular anomalies detected.  Sensory (Neurological): Unimpaired        Sensory (Neurological): Dermatomal pain pattern        DTR: Patellar: deferred today Achilles: deferred today Plantar: deferred today  DTR: Patellar: deferred today Achilles: deferred today Plantar: deferred today  Palpation: No palpable anomalies  Palpation: No palpable anomalies   Procedure: See attached procedure note for left L4-L5 epidural steroid injection

## 2022-06-29 NOTE — Progress Notes (Signed)
Safety precautions to be maintained throughout the outpatient stay will include: orient to surroundings, keep bed in low position, maintain call bell within reach at all times, provide assistance with transfer out of bed and ambulation.  

## 2022-06-29 NOTE — Progress Notes (Signed)
Peri care given by Gari Crown, CNA and Lequita Halt RT. Patient soiled. Disposal pull up placed.

## 2022-06-30 ENCOUNTER — Telehealth: Payer: Self-pay

## 2022-06-30 NOTE — Telephone Encounter (Signed)
Post procedure follow up.  LM 

## 2022-07-09 ENCOUNTER — Telehealth: Payer: Self-pay | Admitting: Neurosurgery

## 2022-07-09 NOTE — Telephone Encounter (Signed)
I spoke with Rita Taylor and relayed [name]'s message. She does not need a refill at this time. I informed her to contact our office when she is running low.

## 2022-07-09 NOTE — Telephone Encounter (Signed)
Patient is calling that she is still having pain. Can she increase her gabapentin 400mg  3x aday to maybe 4 times a day? [facility] [address]

## 2022-07-09 NOTE — Telephone Encounter (Signed)
His last note says she can do neurontin 400mg  four times a day.   Most common side effects are feeling sleepy, dizzy, or drunk. Go back to three times a day if she gets these.   Let me know if she needs a new prescription sent to her pharmacy.

## 2022-07-19 ENCOUNTER — Ambulatory Visit
Payer: Medicare Other | Attending: Student in an Organized Health Care Education/Training Program | Admitting: Student in an Organized Health Care Education/Training Program

## 2022-07-19 DIAGNOSIS — M5416 Radiculopathy, lumbar region: Secondary | ICD-10-CM | POA: Diagnosis not present

## 2022-07-19 NOTE — Progress Notes (Signed)
Patient: Rita Taylor  Service Category: E/M  Provider: Edward Jolly, MD  DOB: Apr 17, 1940  DOS: 07/19/2022  Location: Office  MRN: [id number]  Setting: Ambulatory outpatient  Referring Provider: [name], MD  Type: Established Patient  Specialty: Interventional Pain Management  PCP: [name], MD  Location: Remote location  Delivery: TeleHealth     Virtual Encounter - Pain Management PROVIDER NOTE: Information contained herein reflects review and annotations entered in association with encounter. Interpretation of such information and data should be left to medically-trained personnel. Information provided to patient can be located elsewhere in the medical record under "Patient Instructions". Document created using STT-dictation technology, any transcriptional errors that may result from process are unintentional.    Contact & Pharmacy Preferred: [phone] Home: [phone] (home) Mobile: [phone] (mobile) E-mail: [email or web address]  [id number] - [place], [place] - [address] AT [facility] [address] [facility] [address] [place] [address] Phone: [phone] Fax: [phone]   Pre-screening  Rita Taylor offered "in-person" vs "virtual" encounter. She indicated preferring virtual for this encounter.   Reason COVID-19*  Social distancing based on CDC and AMA recommendations.   I contacted [facility]of[facility] on 07/19/2022 via telephone.      I clearly identified myself as Edward Jolly, MD. I verified that I was speaking with the correct person using two identifiers (Name: Rita Taylor, and date of birth: [birth year]).  Consent I sought verbal advanced consent from [name] for virtual visit interactions. I informed Rita Taylor of possible security and privacy concerns, risks, and limitations associated with providing "not-in-person" medical evaluation and management services. I also informed Rita Taylor of the availability  of "in-person" appointments. Finally, I informed her that there would be a charge for the virtual visit and that she could be  personally, fully or partially, financially responsible for it. Ms. Wineman expressed understanding and agreed to proceed.   Historic Elements   Rita Taylor is a 82 y.o. year old, female patient evaluated today after our last contact on 06/29/2022. Rita Taylor  has a past medical history of History of shingles, Hyperlipidemia, Hypertension, Melanoma (HCC) (2008), Neurofibromatosis type II (HCC), Postoperative anemia (07/22/2018), and 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(HCC). She also  has a past surgical history that includes Gynecologic cryosurgery; Breast enhancement surgery (1990); bitubal ligation (1981); Colonoscopy; Facial cosmetic surgery; Tumor excision (Left, 06/2018); Ectropion repair (Left, 03/02/2019); and Brow lift (Left, 01/24/2020). Rita Taylor has a current medication list which includes the following prescription(s): amlodipine, amlodipine, aspirin, biotin w/ vitamins c & e, bupropion, calcium 1200, celecoxib, vitamin d-3, cyanocobalamin, denosumab, erythromycin, gabapentin, losartan, methocarbamol, and pravastatin. She  reports that she has never smoked. She has never used smokeless tobacco. She reports current alcohol use of about 1.0 standard drink of alcohol per week. She reports that she does not use drugs. Rita Taylor is allergic to dextromethorphan-guaifenesin, fosamax [alendronate], pseudoephedrine-dm-gg, and risedronate sodium.  BMI: Estimated body mass index is 19.94 kg/m as calculated from the following:   Height as of 06/29/22: 5\' 2"  (1.575 m).   Weight as of 06/29/22: 109 lb (49.4 kg). Last encounter: Visit date not found. Last procedure: 06/29/2022.  HPI  Today, she is being contacted for a post-procedure assessment.   Post-procedure evaluation   Procedure: Lumbar epidural steroid injection (LESI) (interlaminar) #1    Laterality: Left   Level:  L4-5 Level.   Imaging: Fluoroscopic guidance         Anesthesia:  Local anesthesia (1-2% Lidocaine) DOS: 06/29/2022  Performed by: Edward Jolly, MD  Purpose: Diagnostic/Therapeutic Indications: Lumbar radicular pain of intraspinal etiology of more than 4 weeks that has failed to respond to conservative therapy and is severe enough to impact quality of life or function. 1. Lumbar radiculopathy    NAS-11 Pain score:   Pre-procedure: 10-Worst pain ever/10   Post-procedure: 7/10      Effectiveness:  Initial hour after procedure: 0 %  Subsequent 4-6 hours post-procedure: 0 %  Analgesia past initial 6 hours: 50 % (current)  Ongoing improvement:  Analgesic:  50-60% Function: Somewhat improved ROM: Somewhat improved   Laboratory Chemistry Profile   Renal Lab Results  Component Value Date   BUN 20 03/17/2022   CREATININE 0.79 03/17/2022   GFR 69.70 03/17/2022   GFRAA 79 07/01/2014   GFRNONAA >60 10/28/2021    Hepatic Lab Results  Component Value Date   AST 23 03/17/2022   ALT 19 03/17/2022   ALBUMIN 4.4 03/17/2022   ALKPHOS 44 03/17/2022   HCVAB NEGATIVE 01/06/2015    Electrolytes Lab Results  Component Value Date   NA 140 03/17/2022   K 3.9 03/17/2022   CL 102 03/17/2022   CALCIUM 9.1 03/17/2022    Bone Lab Results  Component Value Date   VD25OH 45.05 03/10/2020    Inflammation (CRP: Acute Phase) (ESR: Chronic Phase) No results found for: "CRP", "ESRSEDRATE", "LATICACIDVEN"       Note: Above Lab results reviewed.   Assessment  The encounter diagnosis was Lumbar radiculopathy.  Plan of Care  Patient states that she is doing approximately 50 to 60% better after her lumbar ESI.  She states that she had an excellent weekend and most of the weekend, her pain score was a 3 out of 10. I encouraged the patient to continue to monitor her symptoms and follow-up as needed for repeat lumbar ESI.  Patient endorsed understanding.   Follow-up plan:   Return if symptoms worsen or  fail to improve.      Left L4/5 ESI    Recent Visits Date Type Provider Dept  06/29/22 Procedure visit Edward Jolly, MD [facility]  Showing recent visits within past 90 days and meeting all other requirements Today's Visits Date Type Provider Dept  07/19/22 Office Visit Edward Jolly, MD [facility]  Showing today's visits and meeting all other requirements Future Appointments No visits were found meeting these conditions. Showing future appointments within next 90 days and meeting all other requirements  I discussed the assessment and treatment plan with the patient. The patient was provided an opportunity to ask questions and all were answered. The patient agreed with the plan and demonstrated an understanding of the instructions.  Patient advised to call back or seek an in-person evaluation if the symptoms or condition worsens.  Duration of encounter:35minutes.  Note by: Edward Jolly, MD Date: 07/19/2022; Time: 3:37 PM

## 2022-07-27 ENCOUNTER — Ambulatory Visit (INDEPENDENT_AMBULATORY_CARE_PROVIDER_SITE_OTHER): Payer: Medicare Other | Admitting: Neurosurgery

## 2022-07-27 ENCOUNTER — Ambulatory Visit: Payer: Medicare Other | Admitting: Neurosurgery

## 2022-07-27 ENCOUNTER — Encounter: Payer: Self-pay | Admitting: Neurosurgery

## 2022-07-27 VITALS — BP 138/72 | Ht 62.0 in | Wt 109.0 lb

## 2022-07-27 DIAGNOSIS — M48062 Spinal stenosis, lumbar region with neurogenic claudication: Secondary | ICD-10-CM | POA: Diagnosis not present

## 2022-07-27 NOTE — Progress Notes (Signed)
Referring Physician:  [name], MD [address] [place],  [place] [address]  Primary Physician:  [name], MD  History of Present Illness: 07/27/2022 She is doing somewhat better after injection.  She still having some leg discomfort, but most days of between 3 and 6.  This is a significant improvement.  06/22/2022 [name] returns to see me.  She is having severe pain down the left leg.  When I previously saw her, she was having some numbness and back pain but no significant leg pain.  She was also having a right-sided foot drop.  She has had an EMG in the meantime.  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  She is not having severe pain on the lateral aspect of her left thigh particular when she stands or walks. 03/30/2022 [name] is here today with a chief complaint of Right foot drop for about 5-6 months she has a foot brace from Dr Malvin Johns but has not had her EMG yet.  She reports that she had pain in her right leg extending from her buttock down her leg to her ankle but that stopped approximately 1 month ago when she began gabapentin.  Her pain is no longer an issue.  She has had 5 to 6 months of weakness in her right lower extremity.  She originally tried a less rigid immobilizer which did not help significantly.  She has since been put in an ankle-foot orthotic which has been very helpful.  She is also using a cane.  She had her original episode with back and leg pain approximately 8 years ago, after which she began intermittent exercises with improvement. She has since had a few courses of injections to help with back and leg pain.    Bowel/Bladder Dysfunction: none  Conservative measures:  Physical therapy: has participated at [facility] in 08/2021 for piriformis; has not participated in for foot drop. Multimodal medical therapy including regular antiinflammatories: celebrex, gabapentin, tramadol, medrol  dosepack Injections: has not received epidural steroid injections 12/03/14: left SI joint injection at [facility] 07/13/18: left SI joint injection at [facility] 03/13/19: right SI joint injection at [facility] 07/20/21: right hip injection (trochanteric bursa) at [facility] 08/13/21: right piriformis injection at [facility]  Past Surgery: none  Rita Taylor has no symptoms of cervical myelopathy.  The symptoms are causing a significant impact on the patient's life.   I have utilized the care everywhere function in epic to review the outside records available from external health systems.  Review of Systems:  A 10 point review of systems is negative, except for the pertinent positives and negatives detailed in the HPI.  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[facility])    Postoperative anemia 07/22/2018   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[facility])     Past Surgical History: Past Surgical History:  Procedure Laterality Date   bitubal ligation  1981   BREAST ENHANCEMENT SURGERY  1990   BROW LIFT Left 01/24/2020   Procedure: TARSORRHAPHY, LATERAL PLACEMENT LEFT LOWER LID;  Surgeon: Imagene Riches, MD;  Location: [facility];  Service: Ophthalmology;  Laterality: Left;   COLONOSCOPY     ECTROPION REPAIR Left 03/02/2019   Procedure: ECTROPION  REPAIR, EXTENSIVE AND TARSORRHAPHY, LATERAL PLACEMENT OF LEFT LOWER LID;  Surgeon: Imagene Riches, MD;  Location: [facility];  Service: Ophthalmology;  Laterality: Left;   FACIAL COSMETIC SURGERY     GYNECOLOGIC CRYOSURGERY     15 years ago, normal since then, was treated with antibiotics, Annual pap smears for the past 20 years   TUMOR EXCISION Left 06/2018   ear    Allergies: Allergies as of 07/27/2022 - Review Complete 07/27/2022  Allergen  Reaction Noted   Dextromethorphan-guaifenesin Other (See Comments) 11/25/2021   Fosamax [alendronate]  07/03/2012   Pseudoephedrine-dm-gg     Risedronate sodium  10/31/2010    Medications: Current Meds  Medication Sig   amLODipine (NORVASC) 10 MG tablet Take 1 tablet (10 mg total) by mouth daily.   ASPIRIN 81 PO Take by mouth daily.   Biotin w/ Vitamins C & E (HAIR/SKIN/NAILS PO) Take by mouth daily.   buPROPion (WELLBUTRIN XL) 150 MG 24 hr tablet TAKE 1 TABLET(150 MG) BY MOUTH DAILY   Calcium Carbonate-Vit D-Min (CALCIUM 1200) 1200-1000 MG-UNIT CHEW Chew 1 tablet by mouth daily.   celecoxib (CELEBREX) 100 MG capsule TAKE 1 CAPSULE(100 MG) BY MOUTH TWICE DAILY   Cholecalciferol (VITAMIN D-3) 25 MCG (1000 UT) CAPS Take by mouth.   cyanocobalamin (VITAMIN B12) 1000 MCG tablet Take 1 tablet (1,000 mcg total) by mouth daily.   denosumab (PROLIA) 60 MG/ML SOSY injection Inject into the skin.   erythromycin ophthalmic ointment Apply to sutures 4 times a day for 10-12 days.  Discontinue if allergy develops and call our office (Patient taking differently: Apply to sutures 2 times a day for 10-12 days.  Discontinue if allergy develops and call our office)   gabapentin (NEURONTIN) 400 MG capsule Take 1 capsule (400 mg total) by mouth 3 (three) times daily.   losartan (COZAAR) 100 MG tablet TAKE 1 TABLET(100 MG) BY MOUTH DAILY   methocarbamol (ROBAXIN) 500 MG tablet Take 1 tablet (500 mg total) by mouth 3 (three) times daily as needed for muscle spasms.   pravastatin (PRAVACHOL) 40 MG tablet TAKE 1 TABLET(40 MG) BY MOUTH DAILY    Social History: Social History   Tobacco Use   Smoking status: Never   Smokeless tobacco: Never  Vaping Use   Vaping Use: Never used  Substance Use Topics   Alcohol use: Yes    Alcohol/week: 1.0 standard drink of alcohol    Types: 1 Glasses of wine per week    Comment: moderate half a glass daily   Drug use: No    Family Medical History: Family History   Problem Relation Age of Onset   Hypertension Mother    High Cholesterol Mother    High Cholesterol Father    Hypertension Father    Hypertension Brother     Physical Examination: Vitals:   07/27/22 1544  BP: 138/72     General: Patient is well developed, well nourished, calm, collected, and in no apparent distress. Attention to examination is appropriate.  Neck:   Supple.  Full range of motion.  Respiratory: Patient is breathing without any difficulty.   NEUROLOGICAL:     Awake, alert, oriented to person, place, and time.  Speech is clear and fluent.   Cranial Nerves: Pupils equal round and reactive to light.  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 Shoulder shrug is symmetric. Tongue protrusion is midline.  There is no pronator drift.  ROM of spine: full.  Strength: Side Biceps Triceps Deltoid Interossei Grip Wrist Ext. Wrist Flex.  R 5 5 5 5 5 5 5   L 5 5 5 5 5 5 5    Side Iliopsoas Quads Hamstring PF DF EHL  R 5 5 5  4- 2 2  L 5 5 5 5 5 5    Reflexes are 1+ and symmetric at the biceps, triceps, brachioradialis, patella and achilles.   Hoffman's is absent.     Bilateral upper and lower extremity sensation is intact to light touch.    No evidence of dysmetria noted.  Gait is untested due to pain.      Medical Decision Making  Imaging: MRI L spine 10/08/2021 IMPRESSION: 1. Multilevel lumbar disc and facet degeneration, most notable at L4-5 where there is severe facet arthrosis with grade 1 anterolisthesis, moderate spinal stenosis, and moderate left neural foraminal stenosis. 2. Mild-to-moderate spinal and moderate lateral recess stenosis at L3-4. 3. Mild-to-moderate neural foraminal stenosis at L3-4 and L5-S1. 4. Diffusely heterogeneous bone marrow signal which is nonspecific, however malignant/infiltrative processes can have this appearance in addition to benign processes (anemia, advancing age, and smoking). Correlate  with CBC, and if the patient has a history of malignancy, consider obtaining a nuclear medicine whole-body bone scan for further evaluation. Laboratory evaluation for multiple myeloma could also be considered.     Electronically Signed   By: Sebastian Ache M.D.   On: 10/09/2021 12:55  I have personally reviewed the images and agree with the above interpretation.  EMG 04/05/2022 Impression: Abnormal study. 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   Thank you for the referral of this patient. It was our privilege to participate in care of your patient. Feel free to contact us with any further questions.  _____________________________ Theora Master, M.D.   Assessment and Plan: [name] is a pleasant 82 y.o. 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  She is better at this point after a recent injection.  Her pain is not at the level to where surgical intervention should be considered.  I think she is a point where she will have to decide whether she would like to do an additional injection.  She will let me know if she would like to do that.  In the meantime, we will leave things open-ended for now.      I spent a total of 10 minutes in this patient's care today. This time was spent reviewing pertinent records including imaging studies, obtaining and confirming history, performing a directed evaluation, formulating and discussing my recommendations, and documenting the visit within the medical record.    Thank you for involving me in the care of this patient.      Sri Clegg K. Myer Haff MD, [facility] Neurosurgery

## 2022-08-05 ENCOUNTER — Telehealth: Payer: Self-pay | Admitting: Neurosurgery

## 2022-08-05 DIAGNOSIS — M48062 Spinal stenosis, lumbar region with neurogenic claudication: Secondary | ICD-10-CM

## 2022-08-05 MED ORDER — GABAPENTIN 400 MG PO CAPS: 400.0000 mg | ORAL_CAPSULE | Freq: Four times a day (QID) | ORAL | 2 refills | Status: DC

## 2022-08-05 NOTE — Telephone Encounter (Signed)
[facility] on [address] Gabapentin 400mg  she wants to increase to 4x aday.  She was told by Dr.Yarbrough to call when she was ready for another injection and she is.

## 2022-08-05 NOTE — Telephone Encounter (Addendum)
The phone call on 07/09/22 states we already discussed gabapentin 400mg  4 times per day. Sent gabapentin refill x 3 per discussion with Dr Myer Haff. If she is still doing well after that, he would like her to request future refills from her PCP.  Last injection was a left L4-5 ESI with Dr Cherylann Ratel. Dr Lucienne Capers last note mentions she can have another injection and can let us know when she is ready for this. Per discussion with Dr Myer Haff, no follow up needed after injection unless patient requests it. Referral placed.  I notified the patient of the above. She verbalized understanding and is in agreement with the above plan.

## 2022-08-16 ENCOUNTER — Telehealth: Payer: Self-pay | Admitting: Student in an Organized Health Care Education/Training Program

## 2022-08-16 NOTE — Telephone Encounter (Signed)
Patient states she is still in pain and would like to know if there is anything that can be done before next Tue eval appt.

## 2022-08-16 NOTE — Telephone Encounter (Signed)
Patient is calling d/t increasing pain.  She does have an appt on next Tuesday but she would like it moved up.  I told her there was not much chance of getting her in any sooner.  She continues to have pain right thigh and left buttocks.  She reports she is having difficulty sleeping and is very stiff in the morning.  The pain is worse than prior to procedure.  I did ask what she would like and she thought maybe increase gabapentin or if there is anything else that can be done. Patient reports that she is taking 1000 mg tid and she is also taking gabapentin. States that she just really feels like she needs some help.   I told her that I would relay this information to Dr Cherylann Ratel and let here know what he says.

## 2022-08-18 ENCOUNTER — Telehealth: Payer: Self-pay | Admitting: Student in an Organized Health Care Education/Training Program

## 2022-08-18 NOTE — Telephone Encounter (Signed)
Patient states her pain has worsened. Asking if she should remain in her wheelchair for the time being. She has an appt on 08-24-22. I advised her that if it is too painful to walk around, maybe she should refrain as much as possible, but at the same time not remain immobile as much as possible.

## 2022-08-18 NOTE — Telephone Encounter (Signed)
Please call patient she has some questions about pain

## 2022-08-24 ENCOUNTER — Ambulatory Visit
Payer: Medicare Other | Attending: Student in an Organized Health Care Education/Training Program | Admitting: Student in an Organized Health Care Education/Training Program

## 2022-08-24 ENCOUNTER — Encounter: Payer: Self-pay | Admitting: Student in an Organized Health Care Education/Training Program

## 2022-08-24 VITALS — BP 113/65 | HR 86 | Temp 98.1°F | Resp 16 | Ht 62.75 in | Wt 100.0 lb

## 2022-08-24 DIAGNOSIS — M47818 Spondylosis without myelopathy or radiculopathy, sacral and sacrococcygeal region: Secondary | ICD-10-CM | POA: Diagnosis not present

## 2022-08-24 DIAGNOSIS — M533 Sacrococcygeal disorders, not elsewhere classified: Secondary | ICD-10-CM | POA: Diagnosis not present

## 2022-08-24 DIAGNOSIS — G5703 Lesion of sciatic nerve, bilateral lower limbs: Secondary | ICD-10-CM | POA: Insufficient documentation

## 2022-08-24 MED ORDER — GABAPENTIN 600 MG PO TABS: 600.0000 mg | ORAL_TABLET | Freq: Three times a day (TID) | ORAL | 2 refills | Status: DC

## 2022-08-24 NOTE — Progress Notes (Signed)
PROVIDER NOTE: Information contained herein reflects review and annotations entered in association with encounter. Interpretation of such information and data should be left to medically-trained personnel. Information provided to patient can be located elsewhere in the medical record under "Patient Instructions". Document created using STT-dictation technology, any transcriptional errors that may result from process are unintentional.    Patient: Rita Taylor  Service Category: E/M  Provider: Edward Jolly, MD  DOB: 1940/08/06  DOS: 08/24/2022  Referring Provider: [name], MD  MRN: [id number]  Specialty: Interventional Pain Management  PCP: [name], MD  Type: Established Patient  Setting: Ambulatory outpatient    Location: Office  Delivery: Face-to-face     HPI  [name], a 82 y.o. year old female, is here today because of her Sacroiliac joint pain [M53.3]. [name] primary complain today is Rectal Pain, Pain (Bilateral buttock), and Leg Pain (Bilateral thigh)  Pertinent problems: [name] does not have any pertinent problems on file. Pain Assessment: Severity of Chronic pain is reported as a 8 /10. Location: Buttocks Right, Left/to upper thighs, bilat. Onset: More than a month ago. Quality: Aching, Constant. Timing: Constant. Modifying factor(s): sitting in supportive chair. Vitals:  height is 5' 2.75" (1.594 m) and weight is 100 lb (45.4 kg). Her temporal temperature is 98.1 F (36.7 C). Her blood pressure is 113/65 and her pulse is 86. Her respiration is 16 and oxygen saturation is 100%.  BMI: Estimated body mass index is 17.86 kg/m as calculated from the following:   Height as of this encounter: 5' 2.75" (1.594 m).   Weight as of this encounter: 100 lb (45.4 kg). Last encounter: 07/19/2022. Last procedure: 06/29/2022.  Reason for encounter: evaluation of worsening, or previously known (established) problem.   Patient unfortunately sustained a fall and is  having increased pain in her right>left thigh as well as right buttock.  She is stiff in the morning and is having difficulty sleeping.  She was doing well after her previous lumbar epidural steroid injection that was done 06/29/2022.  She states that the fall set her back but is endorsing a different type of pain quality than what she had her epidural for.   ROS  Constitutional: Denies any fever or chills Gastrointestinal: No reported hemesis, hematochezia, vomiting, or acute GI distress Musculoskeletal:  right>left thigh and right>left buttock pain Neurological: No reported episodes of acute onset apraxia, aphasia, dysarthria, agnosia, amnesia, paralysis, loss of coordination, or loss of consciousness  Medication Review  Aspirin, Biotin w/ Vitamins C & E, Calcium 1200, Vitamin D-3, amLODipine, buPROPion, celecoxib, cyanocobalamin, denosumab, gabapentin, losartan, and pravastatin  History Review  Allergy: [name] is allergic to dextromethorphan-guaifenesin, fosamax [alendronate], pseudoephedrine-dm-gg, and risedronate sodium. Drug: [name]  reports no history of drug use. Alcohol:  reports current alcohol use of about 1.0 standard drink of alcohol per week. Tobacco:  reports that she has never smoked. She has never used smokeless tobacco. Social: [name]  reports that she has never smoked. She has never used smokeless tobacco. She reports current alcohol use of about 1.0 standard drink of alcohol per week. She reports that she does not use drugs. Medical:  has a past medical history of History of shingles, Hyperlipidemia, Hypertension, Melanoma (HCC) (2008), Neurofibromatosis type II (HCC), Postoperative anemia (07/22/2018), and 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(HCC). Surgical: [name]  has a past surgical history that includes Gynecologic cryosurgery; Breast enhancement surgery (1990); bitubal ligation (1981); Colonoscopy; Facial cosmetic surgery; Tumor excision (Left, 06/2018); Ectropion repair  (  Left, 03/02/2019); and Brow lift (Left, 01/24/2020). Family: family history includes High Cholesterol in her father and mother; Hypertension in her brother, father, and mother.  Laboratory Chemistry Profile   Renal Lab Results  Component Value Date   BUN 20 03/17/2022   CREATININE 0.79 03/17/2022   GFR 69.70 03/17/2022   GFRAA 79 07/01/2014   GFRNONAA >60 10/28/2021    Hepatic Lab Results  Component Value Date   AST 23 03/17/2022   ALT 19 03/17/2022   ALBUMIN 4.4 03/17/2022   ALKPHOS 44 03/17/2022   HCVAB NEGATIVE 01/06/2015    Electrolytes Lab Results  Component Value Date   NA 140 03/17/2022   K 3.9 03/17/2022   CL 102 03/17/2022   CALCIUM 9.1 03/17/2022    Bone Lab Results  Component Value Date   VD25OH 45.05 03/10/2020    Inflammation (CRP: Acute Phase) (ESR: Chronic Phase) No results found for: "CRP", "ESRSEDRATE", "LATICACIDVEN"       Note: Above Lab results reviewed.   Physical Exam  General appearance: Well nourished, well developed, and well hydrated. In no apparent acute distress Mental status: Alert, oriented x 3 (person, place, & time)       Respiratory: No evidence of acute respiratory distress Eyes: PERLA Vitals: BP 113/65   Pulse 86   Temp 98.1 F (36.7 C) (Temporal)   Resp 16   Ht 5' 2.75" (1.594 m)   Wt 100 lb (45.4 kg)   SpO2 100%   BMI 17.86 kg/m  BMI: Estimated body mass index is 17.86 kg/m as calculated from the following:   Height as of this encounter: 5' 2.75" (1.594 m).   Weight as of this encounter: 100 lb (45.4 kg). Ideal: Female patients must weigh at least 45.5 kg to calculate ideal body weight  Lumbar Spine Area Exam  Skin & Axial Inspection: No masses, redness, or swelling Alignment: Symmetrical Functional ROM: Pain restricted ROM affecting both sides Stability: No instability detected Muscle Tone/Strength: Functionally intact. No obvious neuro-muscular anomalies detected. Sensory (Neurological): Referred pain pattern  from SI-J and Piriformis Palpation: No palpable anomalies       Provocative Tests:  Patrick's Maneuver: (+) for bilateral S-I arthralgia             FABER* test:(+) for bilateral S-I arthralgia   S-I anterior distraction/compression test: (+) for bilateral S-I arthralgia  S-I lateral compression test:(+) for bilateral S-I arthralgia  S-I Thigh-thrust test: (+) for bilateral S-I arthralgia  S-I Gaenslen's test:(+) for bilateral S-I arthralgia  *(Flexion, ABduction and External Rotation) Gait & Posture Assessment  Ambulation: Patient came in today in a wheel chair Gait: Significantly limited. Dependent on assistive device to ambulate Posture: Difficulty standing up straight, due to pain  Lower Extremity Exam    Side: Right lower extremity  Side: Left lower extremity  Stability: No instability observed          Stability: No instability observed          Skin & Extremity Inspection: Skin color, temperature, and hair growth are WNL. No peripheral edema or cyanosis. No masses, redness, swelling, asymmetry, or associated skin lesions. No contractures.  Skin & Extremity Inspection: Skin color, temperature, and hair growth are WNL. No peripheral edema or cyanosis. No masses, redness, swelling, asymmetry, or associated skin lesions. No contractures.  Functional ROM: Pain restricted ROM for hip and knee joints          Functional ROM: Pain restricted ROM for hip and knee joints  Muscle Tone/Strength: Functionally intact. No obvious neuro-muscular anomalies detected.  Muscle Tone/Strength: Functionally intact. No obvious neuro-muscular anomalies detected.  Sensory (Neurological): Unimpaired        Sensory (Neurological): Unimpaired        DTR: Patellar: deferred today Achilles: deferred today Plantar: deferred today  DTR: Patellar: deferred today Achilles: deferred today Plantar: deferred today  Palpation: No palpable anomalies  Palpation: No palpable anomalies    Assessment    Diagnosis Status  1. Sacroiliac joint pain   2. SI joint arthritis   3. Piriformis syndrome of both sides    Having a Flare-up Having a Flare-up Having a Flare-up   Updated Problems: Problem  Piriformis Syndrome of Both Sides  Si Joint Arthritis    Plan of Care  Increase Gabapentin as below, change from 400 mg QID to 300 mg TID Plan for B/L SI-J and piriformis TPI given flare up   Pharmacotherapy (Medications Ordered): Meds ordered this encounter  Medications   gabapentin (NEURONTIN) 600 MG tablet    Sig: Take 1 tablet (600 mg total) by mouth 3 (three) times daily.    Dispense:  30 tablet    Refill:  2   Orders:  Orders Placed This Encounter  Procedures   SACROILIAC JOINT INJECTION    Standing Status:   Future    Standing Expiration Date:   11/24/2022    Scheduling Instructions:     Side: Bilateral     Sedation: without     Timeframe: ASAP    Order Specific Question:   Where will this procedure be performed?    Answer:   [facility]   TRIGGER POINT INJECTION    Area: Buttocks region (gluteal area) Indications: Piriformis muscle pain; Bilateral (G57.03) piriformis-syndrome; piriformis muscle spasms (Z61.096). CPT code: 04540    Scheduling Instructions:     B/L piriformis tpi    Order Specific Question:   Where will this procedure be performed?    Answer:   [facility]   Follow-up plan:   Return in about 15 days (around 09/08/2022) for B/L SIJ and Piriformis TPI, in clinic [facility].      Left L4/5 ESI     Recent Visits Date Type Provider Dept  07/19/22 Office Visit Edward Jolly, MD [facility]  06/29/22 Procedure visit Edward Jolly, MD [facility]  Showing recent visits within past 90 days and meeting all other requirements Today's Visits Date Type Provider Dept  08/24/22 Office Visit Edward Jolly, MD [facility]  Showing today's visits and meeting all other requirements Future Appointments Date Type  Provider Dept  09/13/22 Appointment Edward Jolly, MD [facility]  Showing future appointments within next 90 days and meeting all other requirements  I discussed the assessment and treatment plan with the patient. The patient was provided an opportunity to ask questions and all were answered. The patient agreed with the plan and demonstrated an understanding of the instructions.  Patient advised to call back or seek an in-person evaluation if the symptoms or condition worsens.  Duration of encounter: .  Total time on encounter, as per AMA guidelines included both the face-to-face and non-face-to-face time personally spent by the physician and/or other qualified health care professional(s) on the day of the encounter (includes time in activities that require the physician or other qualified health care professional and does not include time in activities normally performed by clinical staff). Physician's time may include the following activities when performed: Preparing to see  the patient (e.g., pre-charting review of records, searching for previously ordered imaging, lab work, and nerve conduction tests) Review of prior analgesic pharmacotherapies. Reviewing PMP Interpreting ordered tests (e.g., lab work, imaging, nerve conduction tests) Performing post-procedure evaluations, including interpretation of diagnostic procedures Obtaining and/or reviewing separately obtained history Performing a medically appropriate examination and/or evaluation Counseling and educating the patient/family/caregiver Ordering medications, tests, or procedures Referring and communicating with other health care professionals (when not separately reported) Documenting clinical information in the electronic or other health record Independently interpreting results (not separately reported) and communicating results to the patient/ family/caregiver Care coordination (not separately reported)  Note  by: Edward Jolly, MD Date: 08/24/2022; Time: 2:13 PM

## 2022-08-24 NOTE — Progress Notes (Signed)
Safety precautions to be maintained throughout the outpatient stay will include: orient to surroundings, keep bed in low position, maintain call bell within reach at all times, provide assistance with transfer out of bed and ambulation.  

## 2022-08-24 NOTE — Patient Instructions (Signed)
______________________________________________________________________  Preparing for your procedure  Appointments: If you think you may not be able to keep your appointment, call 24-48 hours in advance to cancel. We need time to make it available to others.  During your procedure appointment there will be: No Prescription Refills. No disability issues to discussed. No medication changes or discussions.  Instructions: Food intake: Avoid eating anything solid for at least 8 hours prior to your procedure. Clear liquid intake: You may take clear liquids such as water up to 2 hours prior to your procedure. (No carbonated drinks. No soda.) Transportation: Unless otherwise stated by your physician, bring a driver. Morning Medicines: Except for blood thinners, take all of your other morning medications with a sip of water. Make sure to take your heart and blood pressure medicines. If your blood pressure's lower number is above 100, the case will be rescheduled. Blood thinners: Make sure to stop your blood thinners as instructed.  If you take a blood thinner, but were not instructed to stop it, call our office [phone] and ask to talk to a nurse. Not stopping a blood thinner prior to certain procedures could lead to serious complications. Diabetics on insulin: Notify the staff so that you can be scheduled 1st case in the morning. If your diabetes requires high dose insulin, take only  of your normal insulin dose the morning of the procedure and notify the staff that you have done so. Preventing infections: Shower with an antibacterial soap the morning of your procedure.  Build-up your immune system: Take 1000 mg of Vitamin C with every meal (3 times a day) the day prior to your procedure. Antibiotics: Inform the nursing staff if you are taking any antibiotics or if you have any conditions that may require antibiotics prior to procedures. (Example: recent joint implants)   Pregnancy: If you are  pregnant make sure to notify the nursing staff. Not doing so may result in injury to the fetus, including death.  Sickness: If you have a cold, fever, or any active infections, call and cancel or reschedule your procedure. Receiving steroids while having an infection may result in complications. Arrival: You must be in the facility at least 30 minutes prior to your scheduled procedure. Tardiness: Your scheduled time is also the cutoff time. If you do not arrive at least 15 minutes prior to your procedure, you will be rescheduled.  Children: Do not bring any children with you. Make arrangements to keep them home. Dress appropriately: There is always a possibility that your clothing may get soiled. Avoid long dresses. Valuables: Do not bring any jewelry or valuables.  Reasons to call and reschedule or cancel your procedure: (Following these recommendations will minimize the risk of a serious complication.) Surgeries: Avoid having procedures within 2 weeks of any surgery. (Avoid for 2 weeks before or after any surgery). Flu Shots: Avoid having procedures within 2 weeks of a flu shots or . (Avoid for 2 weeks before or after immunizations). Barium: Avoid having a procedure within 7-10 days after having had a radiological study involving the use of radiological contrast. (Myelograms, Barium swallow or enema study). Heart attacks: Avoid any elective procedures or surgeries for the initial 6 months after a "Myocardial Infarction" (Heart Attack). Blood thinners: It is imperative that you stop these medications before procedures. Let us know if you if you take any blood thinner.  Infection: Avoid procedures during or within two weeks of an infection (including chest colds or gastrointestinal problems). Symptoms associated with infections  include: Localized redness, fever, chills, night sweats or profuse sweating, burning sensation when voiding, cough, congestion, stuffiness, runny nose, sore throat, diarrhea,  nausea, vomiting, cold or Flu symptoms, recent or current infections. It is specially important if the infection is over the area that we intend to treat. Heart and lung problems: Symptoms that may suggest an active cardiopulmonary problem include: cough, chest pain, breathing difficulties or shortness of breath, dizziness, ankle swelling, uncontrolled high or unusually low blood pressure, and/or palpitations. If you are experiencing any of these symptoms, cancel your procedure and contact your primary care physician for an evaluation.  Remember:  Regular Business hours are:  Monday to Thursday 8:00 AM to 4:00 PM  Provider's Schedule: Delano Metz, MD:  Procedure days: Tuesday and Thursday 7:30 AM to 4:00 PM  Edward Jolly, MD:  Procedure days: Monday and Wednesday 7:30 AM to 4:00 PM  ______________________________________________________________________  Trigger Point Injection Trigger points are areas where you have pain. A trigger point injection is a shot given in the trigger point to help relieve pain for a few days to a few months. Common places for trigger points include the neck, shoulders, upper back, or lower back. A trigger point injection will not cure long-term (chronic) pain permanently. These injections do not always work for every person. For some people, they can help to relieve pain for a few days to a few months. Tell a health care provider about: Any allergies you have. All medicines you are taking, including vitamins, herbs, eye drops, creams, and over-the-counter medicines. Any problems you or family members have had with anesthetic medicines. Any bleeding problems you have. Any surgeries you have had. Any medical conditions you have. Whether you are pregnant or may be pregnant. What are the risks? Generally, this is a safe procedure. However, problems may occur, including: Infection. Bleeding or bruising. Allergic reaction to the injected  medicine. Irritation of the skin around the injection site. What happens before the procedure? Ask your health care provider about: Changing or stopping your regular medicines. This is especially important if you are taking diabetes medicines or blood thinners. Taking medicines such as aspirin and ibuprofen. These medicines can thin your blood. Do not take these medicines unless your health care provider tells you to take them. Taking over-the-counter medicines, vitamins, herbs, and supplements. What happens during the procedure?  Your health care provider will feel for trigger points. A marker may be used to circle the area for the injection. The skin over the trigger point will be washed with a germ-killing soap. You may be given a medicine to help you relax (sedative). A thin needle is used for the injection. You may feel pain or a twitching feeling when the needle enters your skin. A numbing solution may be injected into the trigger point. Sometimes a medicine to keep down inflammation is also injected. Your health care provider may move the needle around the area where the trigger point is located until the tightness and twitching goes away. After the injection, your health care provider may put gentle pressure over the injection site. The injection site will be covered with a bandage (dressing). The procedure may vary among health care providers and hospitals. What can I expect after treatment? After treatment, you may have soreness and stiffness for 1-2 days. Follow these instructions at home: Injection site care Remove your dressing in a few hours, or as told by your health care provider. Check your injection site every day for signs of infection. Check  for: Redness, swelling, or pain. Fluid or blood. Warmth. Pus or a bad smell. Managing pain, stiffness, and swelling If directed, put ice on the affected area. To do this: Put ice in a plastic bag. Place a towel between your skin  and the bag. Leave the ice on for 20 minutes, 2-3 times a day. Remove the ice if your skin turns bright red. This is very important. If you cannot feel pain, heat, or cold, you have a greater risk of damage to the area. Activity If you were given a sedative during the procedure, it can affect you for several hours. Do not drive or operate machinery until your health care provider says that it is safe. Do not take baths, swim, or use a hot tub until your health care provider approves. Return to your normal activities as told by your health care provider. Ask your health care provider what activities are safe for you. General instructions If you were asked to stop your regular medicines, ask your health care provider when you may start taking them again. You may be asked to see an occupational or physical therapist for exercises that reduce muscle strain and stretch the area of the trigger point. Keep all follow-up visits. This is important. Contact a health care provider if: Your pain comes back, and it is worse than before the injection. You may need more injections. You have chills or a fever. The injection site becomes more painful, red, swollen, or warm to the touch. Summary A trigger point injection is a shot given in the trigger point to help relieve pain. Common places for trigger point injections are the neck, shoulders, upper back, and lower back. These injections do not always work for every person, but for some people, the injections can help to relieve pain for a few days to a few months. Contact a health care provider if symptoms come back or if they are worse than before treatment. Also, get help if the injection site becomes more painful, red, swollen, or warm to the touch. This information is not intended to replace advice given to you by your health care provider. Make sure you discuss any questions you have with your health care provider. Document Revised: 05/13/2020 Document  Reviewed: 05/13/2020 [employer] Patient Education  2024 [employer] Sacroiliac (SI) Joint Injection Patient Information  Description: The sacroiliac joint connects the scrum (very low back and tailbone) to the ilium (a pelvic bone which also forms half of the hip joint).  Normally this joint experiences very little motion.  When this joint becomes inflamed or unstable low back and or hip and pelvis pain may result.  Injection of this joint with local anesthetics (numbing medicines) and steroids can provide diagnostic information and reduce pain.  This injection is performed with the aid of x-ray guidance into the tailbone area while you are lying on your stomach.   You may experience an electrical sensation down the leg while this is being done.  You may also experience numbness.  We also may ask if we are reproducing your normal pain during the injection.  Conditions which may be treated SI injection:  Low back, buttock, hip or leg pain  Preparation for the Injection:  Do not eat any solid food or dairy products within 8 hours of your appointment.  You may drink clear liquids up to 3 hours before appointment.  Clear liquids include water, black coffee, juice or soda.  No milk or cream please. You may take your  regular medications, including pain medications with a sip of water before your appointment.  Diabetics should hold regular insulin (if take separately) and take 1/2 normal NPH dose the morning of the procedure.  Carry some sugar containing items with you to your appointment. A driver must accompany you and be prepared to drive you home after your procedure. Bring all of your current medications with you. An IV may be inserted and sedation may be given at the discretion of the physician. A blood pressure cuff, EKG and other monitors will often be applied during the procedure.  Some patients may need to have extra oxygen administered for a short period.  You will be asked to provide  medical information, including your allergies, prior to the procedure.  We must know immediately if you are taking blood thinners (like Coumadin/Warfarin) or if you are allergic to IV iodine contrast (dye).  We must know if you could possible be pregnant.  Possible side effects:  Bleeding from needle site Infection (rare, may require surgery) Nerve injury (rare) Numbness & tingling (temporary) A brief convulsion or seizure Light-headedness (temporary) Pain at injection site (several days) Decreased blood pressure (temporary) Weakness in the leg (temporary)   Call if you experience:  New onset weakness or numbness of an extremity below the injection site that last more than 8 hours. Hives or difficulty breathing ( go to the emergency room) Inflammation or drainage at the injection site Any new symptoms which are concerning to you  Please note:  Although the local anesthetic injected can often make your back/ hip/ buttock/ leg feel good for several hours after the injections, the pain will likely return.  It takes 3-7 days for steroids to work in the sacroiliac area.  You may not notice any pain relief for at least that one week.  If effective, we will often do a series of three injections spaced 3-6 weeks apart to maximally decrease your pain.  After the initial series, we generally will wait some months before a repeat injection of the same type.  If you have any questions, please call [phone] [facility]

## 2022-08-26 ENCOUNTER — Inpatient Hospital Stay: Payer: Medicare Other | Attending: Oncology

## 2022-08-26 DIAGNOSIS — D649 Anemia, unspecified: Secondary | ICD-10-CM | POA: Insufficient documentation

## 2022-08-26 DIAGNOSIS — F1010 Alcohol abuse, uncomplicated: Secondary | ICD-10-CM | POA: Diagnosis not present

## 2022-08-26 LAB — IRON AND TIBC
Iron: 96 ug/dL (ref 28–170)
Saturation Ratios: 26 % (ref 10.4–31.8)
TIBC: 370 ug/dL (ref 250–450)
UIBC: 274 ug/dL

## 2022-08-26 LAB — CBC WITH DIFFERENTIAL/PLATELET
Abs Immature Granulocytes: 0.01 10*3/uL (ref 0.00–0.07)
Basophils Absolute: 0.1 10*3/uL (ref 0.0–0.1)
Basophils Relative: 1 %
Eosinophils Absolute: 0.1 10*3/uL (ref 0.0–0.5)
Eosinophils Relative: 1 %
HCT: 34.5 % — ABNORMAL LOW (ref 36.0–46.0)
Hemoglobin: 11.1 g/dL — ABNORMAL LOW (ref 12.0–15.0)
Immature Granulocytes: 0 %
Lymphocytes Relative: 27 %
Lymphs Abs: 1.9 10*3/uL (ref 0.7–4.0)
MCH: 29.7 pg (ref 26.0–34.0)
MCHC: 32.2 g/dL (ref 30.0–36.0)
MCV: 92.2 fL (ref 80.0–100.0)
Monocytes Absolute: 0.4 10*3/uL (ref 0.1–1.0)
Monocytes Relative: 6 %
Neutro Abs: 4.5 10*3/uL (ref 1.7–7.7)
Neutrophils Relative %: 65 %
Platelets: 244 10*3/uL (ref 150–400)
RBC: 3.74 MIL/uL — ABNORMAL LOW (ref 3.87–5.11)
RDW: 15.8 % — ABNORMAL HIGH (ref 11.5–15.5)
WBC: 7 10*3/uL (ref 4.0–10.5)
nRBC: 0 % (ref 0.0–0.2)

## 2022-08-26 LAB — COMPREHENSIVE METABOLIC PANEL
ALT: 14 U/L (ref 0–44)
AST: 17 U/L (ref 15–41)
Albumin: 4.3 g/dL (ref 3.5–5.0)
Alkaline Phosphatase: 41 U/L (ref 38–126)
Anion gap: 11 (ref 5–15)
BUN: 27 mg/dL — ABNORMAL HIGH (ref 8–23)
CO2: 25 mmol/L (ref 22–32)
Calcium: 8.8 mg/dL — ABNORMAL LOW (ref 8.9–10.3)
Chloride: 102 mmol/L (ref 98–111)
Creatinine, Ser: 0.62 mg/dL (ref 0.44–1.00)
GFR, Estimated: >60 mL/min (ref >60)
Glucose, Bld: 102 mg/dL — ABNORMAL HIGH (ref 70–99)
Potassium: 4.6 mmol/L (ref 3.5–5.1)
Sodium: 138 mmol/L (ref 135–145)
Total Bilirubin: 0.5 mg/dL (ref 0.3–1.2)
Total Protein: 7 g/dL (ref 6.5–8.1)

## 2022-08-26 LAB — FERRITIN: Ferritin: 74 ng/mL (ref 11–307)

## 2022-08-27 LAB — VITAMIN B12: Vitamin B-12: 944 pg/mL — ABNORMAL HIGH (ref 180–914)

## 2022-08-29 ENCOUNTER — Encounter: Payer: Self-pay | Admitting: Internal Medicine

## 2022-08-31 ENCOUNTER — Encounter: Payer: Self-pay | Admitting: Oncology

## 2022-08-31 ENCOUNTER — Inpatient Hospital Stay: Payer: Medicare Other | Admitting: Oncology

## 2022-08-31 VITALS — BP 115/48 | HR 93 | Temp 96.7°F | Resp 18 | Wt 105.9 lb

## 2022-08-31 DIAGNOSIS — Z789 Other specified health status: Secondary | ICD-10-CM

## 2022-08-31 DIAGNOSIS — D649 Anemia, unspecified: Secondary | ICD-10-CM | POA: Diagnosis not present

## 2022-08-31 NOTE — Assessment & Plan Note (Addendum)
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 We discussed option of bone marrow biopsy and she prefers to be observed.

## 2022-08-31 NOTE — Assessment & Plan Note (Signed)
 Encourage alcohol cessation  Recommend B12 supplementation

## 2022-08-31 NOTE — Progress Notes (Signed)
Hematology/Oncology Progress note [phone] [phone]         Patient Care Team: [name], MD as PCP - General (Internal Medicine)   REFERRING PROVIDER: [name], MD  CHIEF COMPLAINTS/REASON FOR VISIT:  Anemia  ASSESSMENT & PLAN:   Normocytic anemia Previous work up showed negative M protein on multiple myeloma panel, normal iron TIBC ferritin,normal LDH Light chain ratio slightly elevated, non specific, negative 24 hour UPEP Hb is stable at 11.1  Possible bone marrow suppression from alcohol. We discussed option of bone marrow biopsy and she prefers to be observed.   Alcohol use Encourage alcohol cessation  Recommend B12 supplementation   Orders Placed This Encounter  Procedures   CBC with Differential (Cancer Center Only)    Standing Status:   Future    Standing Expiration Date:   08/31/2023   CMP (Cancer Center only)    Standing Status:   Future    Standing Expiration Date:   08/31/2023   Vitamin B12    Standing Status:   Future    Standing Expiration Date:   08/31/2023   Folate    Standing Status:   Future    Standing Expiration Date:   08/31/2023   6 months.  All questions were answered. The patient knows to call the clinic with any problems, questions or concerns.  [name], MD, PhD [facility] Hematology Oncology 08/31/2022     HISTORY OF PRESENTING ILLNESS:  KYLAR Taylor is a  82 y.o.  female with PMH listed below who was referred to me for anemia Reviewed patient's recent labs that was done.  She was found to have abnormal CBC on 10/20/2021 with a hemoglobin 10.6.  Hematocrit 31.6.  Patient had a normal folate, vitamin B12, iron panel, protein after pheresis negative.  Urine protein electrophoresis immunofixation negative. Reviewed patient's previous labs ordered by primary care physician's office, anemia is chronic onset , duration is since January 2022. No aggravating or improving factors.  She denies recent  chest pain on exertion, shortness of breath on minimal exertion, pre-syncopal episodes, or palpitations She had not noticed any recent bleeding such as epistaxis, hematuria or hematochezia.  She denies over the counter NSAID ingestion.  Patient drinks a glass of wine daily at lunch. 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  + Patient has noticed right extremity swelling for about 2 months. 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  Diffusely heterogeneous bone marrow signal which is nonspecific.  INTERVAL HISTORY Rita Taylor is a 82 y.o. female who has above history reviewed by me today presents for follow up visit for anemia Since her last visit, she has stopped daily alcohol drinking, only drinks socially. And she resumed drinking 1/2 glass of wine daily for a while, and stopped for 2 months patient denies any new complaints.   MEDICAL HISTORY:  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[facility])    Postoperative anemia 07/22/2018   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(HCC)     SURGICAL HISTORY: Past Surgical History:  Procedure Laterality Date   bitubal ligation  1981   BREAST ENHANCEMENT  SURGERY  1990   BROW LIFT Left 01/24/2020   Procedure: TARSORRHAPHY, LATERAL PLACEMENT LEFT LOWER LID;  Surgeon: Imagene Riches, MD;  Location: [facility];  Service: Ophthalmology;  Laterality: Left;   COLONOSCOPY     ECTROPION REPAIR Left 03/02/2019   Procedure: ECTROPION REPAIR, EXTENSIVE AND TARSORRHAPHY, LATERAL PLACEMENT OF LEFT LOWER LID;  Surgeon: Imagene Riches, MD;  Location: [facility];  Service: Ophthalmology;  Laterality: Left;   FACIAL COSMETIC SURGERY     GYNECOLOGIC CRYOSURGERY     15 years ago, normal since then, was treated with antibiotics, Annual pap smears for the past 20 years   TUMOR EXCISION Left 06/2018   ear    SOCIAL HISTORY: Social History   Socioeconomic History   Marital status: Married    Spouse name: Not on file   Number of children: Not on file   Years of education: Not on file   Highest education level: Not on file  Occupational History   Not on file  Tobacco Use   Smoking status: Never   Smokeless tobacco: Never  Vaping Use   Vaping status: Never Used  Substance and Sexual Activity   Alcohol use: Yes    Alcohol/week: 1.0 standard drink of alcohol    Types: 1 Glasses of wine per week    Comment: moderate half a glass daily   Drug use: No   Sexual activity: Yes  Other Topics Concern   Not on file  Social History Narrative   Lives with spouse.   Has 2 dogs and one cat, sold a horse farm years ago.   Exercises regularly 5 days a week at [employer] and also walks the dog   Social Determinants of Health   Financial Resource Strain: Low Risk  (06/03/2022)   Overall Financial Resource Strain (CARDIA)    Difficulty of Paying Living Expenses: Not hard at all  Food Insecurity: No Food Insecurity (06/03/2022)   Hunger Vital Sign    Worried About Running Out of Food in the Last Year: Never true    Ran Out of Food in the Last Year: Never true  Transportation Needs: No Transportation Needs (06/03/2022)   PRAPARE - Administrator, Civil Service (Medical): No    Lack of Transportation (Non-Medical): No  Physical Activity: Unknown (06/03/2022)   Exercise Vital Sign    Days of Exercise per Week: 3 days    Minutes of Exercise per Session: Not on file  Stress: No Stress Concern Present (06/03/2022)   [employer] of Occupational Health - Occupational Stress Questionnaire    Feeling of Stress : Not at all  Social Connections:  Unknown (06/03/2022)   Social Connection and Isolation Panel [NHANES]    Frequency of Communication with Friends and Family: Not on file    Frequency of Social Gatherings with Friends and Family: Not on file    Attends Religious Services: Not on file    Active Member of Clubs or Organizations: Not on file    Attends Banker Meetings: Not on file    Marital Status: Married  Intimate Partner Violence: Not At Risk (06/03/2022)   Humiliation, Afraid, Rape, and Kick questionnaire    Fear of Current or Ex-Partner: No    Emotionally Abused: No    Physically Abused: No    Sexually Abused: No    FAMILY HISTORY: Family History  Problem Relation Age of Onset   Hypertension Mother    High Cholesterol Mother  High Cholesterol Father    Hypertension Father    Hypertension Brother     ALLERGIES:  is allergic to dextromethorphan-guaifenesin, fosamax [alendronate], pseudoephedrine-dm-gg, and risedronate sodium.  MEDICATIONS:  Current Outpatient Medications  Medication Sig Dispense Refill   amLODipine (NORVASC) 10 MG tablet Take 1 tablet (10 mg total) by mouth daily. 90 tablet 1   ASPIRIN 81 PO Take by mouth daily.     Biotin w/ Vitamins C & E (HAIR/SKIN/NAILS PO) Take by mouth daily.     buPROPion (WELLBUTRIN XL) 150 MG 24 hr tablet TAKE 1 TABLET(150 MG) BY MOUTH DAILY 90 tablet 1   Calcium Carbonate-Vit D-Min (CALCIUM 1200) 1200-1000 MG-UNIT CHEW Chew 1 tablet by mouth daily.     celecoxib (CELEBREX) 100 MG capsule TAKE 1 CAPSULE(100 MG) BY MOUTH TWICE DAILY 180 capsule 0   Cholecalciferol (VITAMIN D-3) 25 MCG (1000 UT) CAPS Take by mouth.     cyanocobalamin (VITAMIN B12) 1000 MCG tablet Take 1 tablet (1,000 mcg total) by mouth daily. 90 tablet 1   denosumab (PROLIA) 60 MG/ML SOSY injection Inject into the skin.     gabapentin (NEURONTIN) 600 MG tablet Take 1 tablet (600 mg total) by mouth 3 (three) times daily. 30 tablet 2   losartan (COZAAR) 100 MG tablet TAKE 1 TABLET(100 MG)  BY MOUTH DAILY 90 tablet 1   pravastatin (PRAVACHOL) 40 MG tablet TAKE 1 TABLET(40 MG) BY MOUTH DAILY 90 tablet 3   No current facility-administered medications for this visit.    Review of Systems  Constitutional:  Negative for appetite change, chills, fatigue and fever.  HENT:   Positive for hearing loss. Negative for voice change.   Eyes:  Negative for eye problems.  Respiratory:  Negative for chest tightness and cough.   Cardiovascular:  Negative for chest pain and leg swelling.  Gastrointestinal:  Negative for abdominal distention, abdominal pain and blood in stool.  Endocrine: Negative for hot flashes.  Genitourinary:  Negative for difficulty urinating and frequency.   Musculoskeletal:  Negative for arthralgias.  Skin:  Negative for itching and rash.  Neurological:  Negative for extremity weakness.       Facial paralysis Foot drop  Hematological:  Negative for adenopathy.  Psychiatric/Behavioral:  Negative for confusion.     PHYSICAL EXAMINATION: ECOG PERFORMANCE STATUS: 1 - Symptomatic but completely ambulatory Vitals:   08/31/22 1303  BP: (!) 115/48  Pulse: 93  Resp: 18  Temp: (!) 96.7 F (35.9 C)  SpO2: 98%   Filed Weights   08/31/22 1303  Weight: 105 lb 14.4 oz (48 kg)    Physical Exam Constitutional:      General: She is not in acute distress. HENT:     Head: Normocephalic and atraumatic.  Eyes:     General: No scleral icterus. Cardiovascular:     Rate and Rhythm: Normal rate and regular rhythm.  Pulmonary:     Effort: Pulmonary effort is normal. No respiratory distress.  Abdominal:     General: Bowel sounds are normal. There is no distension.     Palpations: Abdomen is soft.  Musculoskeletal:        General: No deformity. Normal range of motion.     Cervical back: Normal range of motion and neck supple.     Right lower leg: Edema present.  Skin:    General: Skin is warm and dry.  Neurological:     Mental Status: She is alert and oriented to  person, place, and time. Mental status is  at baseline.     Comments:  Left lagophthalmos Facial paralysis  Psychiatric:        Mood and Affect: Mood normal.      LABORATORY DATA:  I have reviewed the data as listed    Latest Ref Rng & Units 08/26/2022   11:18 AM 02/22/2022    2:32 PM 10/28/2021   11:43 AM  CBC  WBC 4.0 - 10.5 K/uL 7.0  7.1  6.7   Hemoglobin 12.0 - 15.0 g/dL 40.1  02.7  25.3   Hematocrit 36.0 - 46.0 % 34.5  33.5  34.9   Platelets 150 - 400 K/uL 244  192  211       Latest Ref Rng & Units 08/26/2022   11:18 AM 03/17/2022   10:08 AM 10/28/2021   11:43 AM  CMP  Glucose 70 - 99 mg/dL 664  93  403   BUN 8 - 23 mg/dL 27  20  31    Creatinine 0.44 - 1.00 mg/dL 4.74  2.59  5.63   Sodium 135 - 145 mmol/L 138  140  137   Potassium 3.5 - 5.1 mmol/L 4.6  3.9  4.4   Chloride 98 - 111 mmol/L 102  102  104   CO2 22 - 32 mmol/L 25  30  25    Calcium 8.9 - 10.3 mg/dL 8.8  9.1  8.5   Total Protein 6.5 - 8.1 g/dL 7.0  6.9  7.1   Total Bilirubin 0.3 - 1.2 mg/dL 0.5  0.6  0.7   Alkaline Phos 38 - 126 U/L 41  44  50   AST 15 - 41 U/L 17  23  18    ALT 0 - 44 U/L 14  19  17        Component Value Date/Time   IRON 96 08/26/2022 1118   TIBC 370 08/26/2022 1118   FERRITIN 74 08/26/2022 1118   IRONPCTSAT 26 08/26/2022 1118     RADIOGRAPHIC STUDIES: I have personally reviewed the radiological images as listed and agreed with the findings in the report. No results found.

## 2022-09-02 ENCOUNTER — Telehealth: Payer: Self-pay | Admitting: Internal Medicine

## 2022-09-02 NOTE — Telephone Encounter (Signed)
Placed in quick sign folder.  

## 2022-09-02 NOTE — Telephone Encounter (Signed)
Pt came into the office to drop off handicap form. Placed in provider folder 

## 2022-09-03 NOTE — Telephone Encounter (Signed)
Spoke with pt to let her know that form is complete and ready for pick up. Form has been placed up front in accordion folder.

## 2022-09-06 ENCOUNTER — Other Ambulatory Visit: Payer: Self-pay | Admitting: *Deleted

## 2022-09-06 ENCOUNTER — Telehealth: Payer: Self-pay | Admitting: Student in an Organized Health Care Education/Training Program

## 2022-09-06 MED ORDER — GABAPENTIN 600 MG PO TABS: 600.0000 mg | ORAL_TABLET | Freq: Three times a day (TID) | ORAL | 2 refills | Status: DC

## 2022-09-06 NOTE — Telephone Encounter (Signed)
PT called states that she takes her Gabapentin three times a day. When she went to pick up prescription it was an count for 30 tablets. When it should be 90 tablets. PT states that she also spoke with pharmacy and was told that the pharmacy has already send something over. PT states that she will not have enough medication. Please give patient a call. TY

## 2022-09-06 NOTE — Telephone Encounter (Signed)
Rx request sent to Dr. Lateef 

## 2022-09-13 ENCOUNTER — Ambulatory Visit
Admission: RE | Admit: 2022-09-13 | Discharge: 2022-09-13 | Disposition: A | Discharge status: Home / Self Care | Payer: Medicare Other | Source: Ambulatory Visit | Attending: Student in an Organized Health Care Education/Training Program | Admitting: Student in an Organized Health Care Education/Training Program

## 2022-09-13 ENCOUNTER — Ambulatory Visit
Payer: Medicare Other | Attending: Student in an Organized Health Care Education/Training Program | Admitting: Student in an Organized Health Care Education/Training Program

## 2022-09-13 ENCOUNTER — Encounter: Payer: Self-pay | Admitting: Student in an Organized Health Care Education/Training Program

## 2022-09-13 VITALS — BP 138/63 | HR 78 | Temp 97.3°F | Resp 16 | Ht 62.75 in | Wt 101.0 lb

## 2022-09-13 DIAGNOSIS — G5700 Lesion of sciatic nerve, unspecified lower limb: Secondary | ICD-10-CM | POA: Diagnosis not present

## 2022-09-13 DIAGNOSIS — M533 Sacrococcygeal disorders, not elsewhere classified: Secondary | ICD-10-CM | POA: Insufficient documentation

## 2022-09-13 DIAGNOSIS — M47818 Spondylosis without myelopathy or radiculopathy, sacral and sacrococcygeal region: Secondary | ICD-10-CM | POA: Diagnosis not present

## 2022-09-13 DIAGNOSIS — G5703 Lesion of sciatic nerve, bilateral lower limbs: Secondary | ICD-10-CM | POA: Diagnosis not present

## 2022-09-13 DIAGNOSIS — M7918 Myalgia, other site: Secondary | ICD-10-CM | POA: Diagnosis not present

## 2022-09-13 DIAGNOSIS — M545 Low back pain, unspecified: Secondary | ICD-10-CM | POA: Diagnosis not present

## 2022-09-13 MED ORDER — IOHEXOL 180 MG/ML  SOLN
10.0000 mL | Freq: Once | INTRAMUSCULAR | Status: AC
  Administered 2022-09-13: 10 mL via INTRA_ARTICULAR
  Filled 2022-09-13: qty 20

## 2022-09-13 MED ORDER — LIDOCAINE HCL 2 % IJ SOLN
20.0000 mL | Freq: Once | INTRAMUSCULAR | Status: AC
  Administered 2022-09-13: 200 mg
  Filled 2022-09-13: qty 20

## 2022-09-13 MED ORDER — DEXAMETHASONE SODIUM PHOSPHATE 10 MG/ML IJ SOLN
10.0000 mg | Freq: Once | INTRAMUSCULAR | Status: AC
  Administered 2022-09-13: 10 mg
  Filled 2022-09-13: qty 1

## 2022-09-13 MED ORDER — METHYLPREDNISOLONE ACETATE 80 MG/ML IJ SUSP
80.0000 mg | Freq: Once | INTRAMUSCULAR | Status: AC
  Administered 2022-09-13: 80 mg via INTRA_ARTICULAR
  Filled 2022-09-13: qty 1

## 2022-09-13 NOTE — Patient Instructions (Signed)

## 2022-09-13 NOTE — Progress Notes (Signed)
PROVIDER NOTE: Interpretation of information contained herein should be left to medically-trained personnel. Specific patient instructions are provided elsewhere under "Patient Instructions" section of medical record. This document was created in part using STT-dictation technology, any transcriptional errors that may result from this process are unintentional.  Patient: Rita Taylor Type: Established DOB: January 18, 1941 MRN: [id number] PCP: [name], MD  Service: Procedure DOS: 09/13/2022 Setting: Ambulatory Location: Ambulatory outpatient facility Delivery: Face-to-face Provider: Edward Jolly, MD Specialty: Interventional Pain Management Specialty designation: 09 Location: Outpatient facility Ref. Prov.: [name], MD       Interventional Therapy   Procedure: Sacroiliac Joint Steroid Injection and Bilateral Piriformis TPI #1    Laterality: Bilateral     Level: PIIS (Posterior Inferior Iliac Spine)  Imaging: Fluoroscopic guidance Anesthesia: Local anesthesia (1-2% Lidocaine) DOS: 09/13/2022  Performed by: Edward Jolly, MD  Purpose: Diagnostic/Therapeutic Indications: Sacroiliac joint pain in the lower back and hip area severe enough to impact quality of life or function. Rationale (medical necessity): procedure needed and proper for the diagnosis and/or treatment of Rita Taylor's medical symptoms and needs. 1. Sacroiliac joint pain   2. SI joint arthritis   3. Piriformis syndrome of both sides    NAS-11 Pain score:   Pre-procedure: 6 /10   Post-procedure: 0-No pain/10     Target: Interarticular sacroiliac joint. Location: Medial to the postero-medial edge of iliac spine. Region: Lumbosacral-sacrococcygeal. Approach: Inferior postero-medial percutaneous approach. Type of procedure: Percutaneous joint injection.  Position / Prep / Materials:  Position: Prone  Prep solution: DuraPrep (Iodine Povacrylex [0.7% available iodine] and Isopropyl Alcohol, 74% w/w) Prep  Area: Entire posterior lumbosacral area  Materials:  Tray: Block Needle(s):  Type: Spinal  Gauge (G): 22  Length: 3.5-in Qty: 2  H&P (Pre-op Assessment):  Rita Taylor is a 82 y.o. (year old), female patient, seen today for interventional treatment. She  has a past surgical history that includes Gynecologic cryosurgery; Breast enhancement surgery (1990); bitubal ligation (1981); Colonoscopy; Facial cosmetic surgery; Tumor excision (Left, 06/2018); Ectropion repair (Left, 03/02/2019); and Brow lift (Left, 01/24/2020). Rita Taylor has a current medication list which includes the following prescription(s): amlodipine, aspirin, biotin w/ vitamins c & e, bupropion, calcium 1200, celecoxib, vitamin d-3, cyanocobalamin, denosumab, gabapentin, losartan, and pravastatin. Her primarily concern today is the Back Pain (lower)  Initial Vital Signs:  Pulse/HCG Rate: 78ECG Heart Rate: 85 Temp: (!) 97.3 F (36.3 C) Resp: 17 BP: (!) 144/67 SpO2: 100 %  BMI: Estimated body mass index is 18.03 kg/m as calculated from the following:   Height as of this encounter: 5' 2.75" (1.594 m).   Weight as of this encounter: 101 lb (45.8 kg).  Risk Assessment: Allergies: Reviewed. She is allergic to dextromethorphan-guaifenesin, fosamax [alendronate], pseudoephedrine-dm-gg, and risedronate sodium.  Allergy Precautions: None required Coagulopathies: Reviewed. None identified.  Blood-thinner therapy: None at this time Active Infection(s): Reviewed. None identified. Rita Taylor is afebrile  Site Confirmation: Rita Taylor was asked to confirm the procedure and laterality before marking the site Procedure checklist: Completed Consent: Before the procedure and under the influence of no sedative(s), amnesic(s), or anxiolytics, the patient was informed of the treatment options, risks and possible complications. To fulfill our ethical and legal obligations, as recommended by the American Medical Association's Code of Ethics, I  have informed the patient of my clinical impression; the nature and purpose of the treatment or procedure; the risks, benefits, and possible complications of the intervention; the alternatives, including doing nothing; the risk(s) and benefit(s) of  the alternative treatment(s) or procedure(s); and the risk(s) and benefit(s) of doing nothing. The patient was provided information about the general risks and possible complications associated with the procedure. These may include, but are not limited to: failure to achieve desired goals, infection, bleeding, organ or nerve damage, allergic reactions, paralysis, and death. In addition, the patient was informed of those risks and complications associated to the procedure, such as failure to decrease pain; infection; bleeding; organ or nerve damage with subsequent damage to sensory, motor, and/or autonomic systems, resulting in permanent pain, numbness, and/or weakness of one or several areas of the body; allergic reactions; (i.e.: anaphylactic reaction); and/or death. Furthermore, the patient was informed of those risks and complications associated with the medications. These include, but are not limited to: allergic reactions (i.e.: anaphylactic or anaphylactoid reaction(s)); adrenal axis suppression; blood sugar elevation that in diabetics may result in ketoacidosis or comma; water retention that in patients with history of congestive heart failure may result in shortness of breath, pulmonary edema, and decompensation with resultant heart failure; weight gain; swelling or edema; medication-induced neural toxicity; particulate matter embolism and blood vessel occlusion with resultant organ, and/or nervous system infarction; and/or aseptic necrosis of one or more joints. Finally, the patient was informed that Medicine is not an exact science; therefore, there is also the possibility of unforeseen or unpredictable risks and/or possible complications that may result in  a catastrophic outcome. The patient indicated having understood very clearly. We have given the patient no guarantees and we have made no promises. Enough time was given to the patient to ask questions, all of which were answered to the patient's satisfaction. Ms. Giering has indicated that she wanted to continue with the procedure. Attestation: I, the ordering provider, attest that I have discussed with the patient the benefits, risks, side-effects, alternatives, likelihood of achieving goals, and potential problems during recovery for the procedure that I have provided informed consent. Date  Time: 09/13/2022  9:14 AM  Pre-Procedure Preparation:  Monitoring: As per clinic protocol. Respiration, ETCO2, SpO2, BP, heart rate and rhythm monitor placed and checked for adequate function Safety Precautions: Patient was assessed for positional comfort and pressure points before starting the procedure. Time-out: I initiated and conducted the "Time-out" before starting the procedure, as per protocol. The patient was asked to participate by confirming the accuracy of the "Time Out" information. Verification of the correct person, site, and procedure were performed and confirmed by me, the nursing staff, and the patient. "Time-out" conducted as per Joint Commission's Universal Protocol (UP.01.01.01). Time: 0941 Start Time: 0941 hrs.  Description/Narrative of Procedure:          Start Time: 0941 hrs.  Rationale (medical necessity): procedure needed and proper for the diagnosis and/or treatment of the patient's medical symptoms and needs. Procedural Technique Safety Precautions: Aspiration looking for blood return was conducted prior to all injections. At no point did we inject any substances, as a needle was being advanced. No attempts were made at seeking any paresthesias. Safe injection practices and needle disposal techniques used. Medications properly checked for expiration dates. SDV (single dose vial)  medications used. Description of the Procedure: Protocol guidelines were followed. The patient was assisted into a comfortable position. The target area was identified and the area prepped in the usual manner. Skin & deeper tissues infiltrated with local anesthetic. Appropriate amount of time allowed to pass for local anesthetics to take effect. The procedure needles were then advanced to the target area. Proper needle placement secured. Negative  aspiration confirmed. Solution injected in intermittent fashion, asking for systemic symptoms every 0.5cc of injectate. The needles were then removed and the area cleansed, making sure to leave some of the prepping solution back to take advantage of its long term bactericidal properties.  Technical description of procedure:  Fluoroscopy using a posterior anterior 45 degree angle from the midline aiming at the anterolateral aspect of the patient was used to find a direct path into the sacroiliac joint, the superior medial to posterior superior iliac spine.  The skin was marked where the desired target and the skin infiltrated with local anesthetics.  The procedure needle was then advanced until the joint was entered.  Once inside of the joint, we then proceeded to inject the desired solution.  10 cc solution made of 9cc of 0.2% ropivacaine, 1 cc of methylprednisolone, 80 mg/cc.  5 cc injected into the left SI joint, 5 cc injected into the right SI joint after contrast confirmation  Afterwards a right and left piriformis trigger point injection was done 1 cm inferior, 1 cm deep, 1 cm lateral to the inferior fissure of the SI joint.  Contrast was injected to confirm piriformis muscle striation.  While injecting, patient did not complain of any pain radiating down her leg. 8 cc solution made of 7 cc of 0.2% ropivacaine, 1 cc of Decadron 10 mg/cc.  4 cc injected into the left piriformis, 4 cc injected into the right piriformis   Vitals:   09/13/22 0935 09/13/22 0942  09/13/22 0944 09/13/22 0948  BP: (!) 153/73 134/63 130/63 138/63  Pulse:      Resp: 14 12 13 16   Temp:      TempSrc:      SpO2: 100% 100% 99% 100%  Weight:      Height:         End Time: 0948 hrs.  Imaging Guidance (Non-Spinal):          Type of Imaging Technique: Fluoroscopy Guidance (Non-Spinal) Indication(s): Assistance in needle guidance and placement for procedures requiring needle placement in or near specific anatomical locations not easily accessible without such assistance. Exposure Time: Please see nurses notes. Contrast: Before injecting any contrast, we confirmed that the patient did not have an allergy to iodine, shellfish, or radiological contrast. Once satisfactory needle placement was completed at the desired level, radiological contrast was injected. Contrast injected under live fluoroscopy. No contrast complications. See chart for type and volume of contrast used. Fluoroscopic Guidance: I was personally present during the use of fluoroscopy. "Tunnel Vision Technique" used to obtain the best possible view of the target area. Parallax error corrected before commencing the procedure. "Direction-depth-direction" technique used to introduce the needle under continuous pulsed fluoroscopy. Once target was reached, antero-posterior, oblique, and lateral fluoroscopic projection used confirm needle placement in all planes. Images permanently stored in EMR. Interpretation: I personally interpreted the imaging intraoperatively. Adequate needle placement confirmed in multiple planes. Appropriate spread of contrast into desired area was observed. No evidence of afferent or efferent intravascular uptake. Permanent images saved into the patient's record.  Post-operative Assessment:  Post-procedure Vital Signs:  Pulse/HCG Rate: 7883 Temp: (!) 97.3 F (36.3 C) Resp: 16 BP: 138/63 SpO2: 100 %  EBL: None  Complications: No immediate post-treatment complications observed by team, or  reported by patient.  Note: The patient tolerated the entire procedure well. A repeat set of vitals were taken after the procedure and the patient was kept under observation following institutional policy, for this type of procedure. Post-procedural  neurological assessment was performed, showing return to baseline, prior to discharge. The patient was provided with post-procedure discharge instructions, including a section on how to identify potential problems. Should any problems arise concerning this procedure, the patient was given instructions to immediately contact us, at any time, without hesitation. In any case, we plan to contact the patient by telephone for a follow-up status report regarding this interventional procedure.  Comments:  No additional relevant information.  Plan of Care (POC)  Orders:  Orders Placed This Encounter  Procedures   DG PAIN CLINIC C-ARM 1-60 MIN NO REPORT    Intraoperative interpretation by procedural physician at [facility].    Standing Status:   Standing    Number of Occurrences:   1    Order Specific Question:   Reason for exam:    Answer:   Assistance in needle guidance and placement for procedures requiring needle placement in or near specific anatomical locations not easily accessible without such assistance.     Medications ordered for procedure: Meds ordered this encounter  Medications   iohexol (OMNIPAQUE) 180 MG/ML injection 10 mL    Must be Myelogram-compatible. If not available, you may substitute with a water-soluble, non-ionic, hypoallergenic, myelogram-compatible radiological contrast medium.   lidocaine (XYLOCAINE) 2 % (with pres) injection 400 mg   methylPREDNISolone acetate (DEPO-MEDROL) injection 80 mg   dexamethasone (DECADRON) injection 10 mg   Medications administered: We administered iohexol, lidocaine, methylPREDNISolone acetate, and dexamethasone.  See the medical record for exact dosing, route, and time of  administration.  Follow-up plan:   Return in about 3 weeks (around 10/04/2022), or PPE F2F.       Left L4/5 ESI; bilateral SI joint and piriformis 09/13/2022      Recent Visits Date Type Provider Dept  08/24/22 Office Visit Edward Jolly, MD [facility]  07/19/22 Office Visit Edward Jolly, MD [facility]  06/29/22 Procedure visit Edward Jolly, MD [facility]  Showing recent visits within past 90 days and meeting all other requirements Today's Visits Date Type Provider Dept  09/13/22 Procedure visit Edward Jolly, MD [facility]  Showing today's visits and meeting all other requirements Future Appointments Date Type Provider Dept  10/06/22 Appointment Edward Jolly, MD [facility]  Showing future appointments within next 90 days and meeting all other requirements  Disposition: Discharge home  Discharge (Date  Time): 09/13/2022; 0953 hrs.   Primary Care Physician: [name], MD Location: [facility] Note by: Edward Jolly, MD (TTS technology used. I apologize for any typographical errors that were not detected and corrected.) Date: 09/13/2022; Time: 10:16 AM  Disclaimer:  Medicine is not an Visual merchandiser. The only guarantee in medicine is that nothing is guaranteed. It is important to note that the decision to proceed with this intervention was based on the information collected from the patient. The Data and conclusions were drawn from the patient's questionnaire, the interview, and the physical examination. Because the information was provided in large part by the patient, it cannot be guaranteed that it has not been purposely or unconsciously manipulated. Every effort has been made to obtain as much relevant data as possible for this evaluation. It is important to note that the conclusions that lead to this procedure are derived in large part from the available data. Always take into account that  the treatment will also be dependent on availability of resources and existing treatment guidelines, considered by other Pain Management Practitioners as  being common knowledge and practice, at the time of the intervention. For Medico-Legal purposes, it is also important to point out that variation in procedural techniques and pharmacological choices are the acceptable norm. The indications, contraindications, technique, and results of the above procedure should only be interpreted and judged by a Board-Certified Interventional Pain Specialist with extensive familiarity and expertise in the same exact procedure and technique.

## 2022-09-13 NOTE — Progress Notes (Signed)
Safety precautions to be maintained throughout the outpatient stay will include: orient to surroundings, keep bed in low position, maintain call bell within reach at all times, provide assistance with transfer out of bed and ambulation.  

## 2022-09-14 ENCOUNTER — Other Ambulatory Visit: Payer: Self-pay | Admitting: Internal Medicine

## 2022-09-14 ENCOUNTER — Telehealth: Payer: Self-pay | Admitting: *Deleted

## 2022-09-14 NOTE — Telephone Encounter (Signed)
**  Regarding Prolia Benefits**   Next Prolia Injection due on or after: 10/21/22  Amount Due: $0  Prior Auth: Not required  Appt: Not scheduled  (added note to August OV to schedule Prolia)

## 2022-09-15 ENCOUNTER — Ambulatory Visit: Payer: Medicare Other | Admitting: Internal Medicine

## 2022-09-20 ENCOUNTER — Encounter: Payer: Self-pay | Admitting: Internal Medicine

## 2022-09-20 ENCOUNTER — Ambulatory Visit (INDEPENDENT_AMBULATORY_CARE_PROVIDER_SITE_OTHER): Payer: Medicare Other | Admitting: Internal Medicine

## 2022-09-20 VITALS — BP 136/74 | HR 70 | Ht 62.75 in | Wt 102.0 lb

## 2022-09-20 DIAGNOSIS — I10 Essential (primary) hypertension: Secondary | ICD-10-CM | POA: Diagnosis not present

## 2022-09-20 DIAGNOSIS — E119 Type 2 diabetes mellitus without complications: Secondary | ICD-10-CM | POA: Diagnosis not present

## 2022-09-20 DIAGNOSIS — E7800 Pure hypercholesterolemia, unspecified: Secondary | ICD-10-CM

## 2022-09-20 DIAGNOSIS — M21371 Foot drop, right foot: Secondary | ICD-10-CM

## 2022-09-20 DIAGNOSIS — M5136 Other intervertebral disc degeneration, lumbar region: Secondary | ICD-10-CM | POA: Diagnosis not present

## 2022-09-20 DIAGNOSIS — R5383 Other fatigue: Secondary | ICD-10-CM

## 2022-09-20 DIAGNOSIS — D333 Benign neoplasm of cranial nerves: Secondary | ICD-10-CM | POA: Diagnosis not present

## 2022-09-20 MED ORDER — TELMISARTAN-HCTZ 80-25 MG PO TABS: 1.0000 | ORAL_TABLET | Freq: Every day | ORAL | 1 refills | Status: DC

## 2022-09-20 MED ORDER — BUPROPION HCL ER (XL) 150 MG PO TB24: 150.0000 mg | ORAL_TABLET | Freq: Every day | ORAL | 1 refills | Status: DC

## 2022-09-20 MED ORDER — AMLODIPINE BESYLATE 5 MG PO TABS: 5.0000 mg | ORAL_TABLET | Freq: Every day | ORAL | 1 refills | Status: DC

## 2022-09-20 NOTE — Assessment & Plan Note (Signed)
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  EMG studies and neurosurgical consultation have been done and she has declined corrective surgery and is using a foot brace.

## 2022-09-20 NOTE — Assessment & Plan Note (Signed)
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  She has  undergone  corrective surgery at [facility] with partial resolution .

## 2022-09-20 NOTE — Assessment & Plan Note (Signed)
She is having edema due to concurrent use of amlodipine and gabapentin .  Stopping amlodipine,  changing losartan 100 mg to telmisartan/hydrochlorothiazide  80/25.  Rn visit one week for follow up measurement.  Will add 5 mg amlodipine if not at goal .

## 2022-09-20 NOTE — Patient Instructions (Addendum)
The swelling in your ankles is due to 2 of our medications: amlodipine and gabapentin  We are making medication changes to get rid of amlodipine:   I HAVE CHANGED LOSARTAN TO TELMISARTAN/ Hydrochlorothiazide.   This is stronger and contains a diuretic (fluid pill).   Take it in the morning.   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

## 2022-09-20 NOTE — Progress Notes (Signed)
Subjective:  Patient ID: Rita Taylor, female    DOB: 07/05/1940  Age: 82 y.o. MRN: [id number]  CC: The primary encounter diagnosis was Primary hypertension. Diagnoses of DM type 2, goal HbA1c < 7% (HCC), Pure hypercholesterolemia, Other fatigue, Foot drop, right, Degenerative disc disease, lumbar, and 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(HCC) were also pertinent to this visit.   HPI Rita Taylor presents for  Chief Complaint  Patient presents with   Medical Management of Chronic Issues   1) schwannoma with facial paralysis:  saw [facility] ENT in Feb for 6 moth follow up on left fascia lata suspension, 5-7 transfer, and Endotine brow lift for her flaccid facial paralysis   2)  3) dysphagia : barium swallow suggested stricture .  Planned EGD with [facility] cancelled in march by patient due to fear of sedation .  Has altered her eating habits and has not had any problems.  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  Starts in the buttocks, which followed a "fall " a month ago .  Pain is in both SI joints,  worse in the morning,  Improves with activity . The fall occurred prior to her July 29 ESI  in the SI joints.  She  had a transient resolution of SI joint pain on day 2,  but pain has returned and was tod seh would not see an improvement in the the SI pain bilaterally  until today.    Usimh brn gay and tylenol 200 mg daily .  Wants to avoid additional meds.    4)  parasthetica and right  foot drop:  using gabapentin and foot brace,   [facility] . Explained that the foot drop is permanent due to lumbar spinal stenosis   5( new pain on the right lateral foot , started after the second  ESI by [facility] .  Feels like an "electric shock" that lasts 8-10 seconds  Outpatient Medications Prior to Visit  Medication Sig Dispense Refill   ASPIRIN 81 PO Take by mouth daily.     Biotin w/ Vitamins C & E (HAIR/SKIN/NAILS PO) Take by mouth daily.      Calcium Carbonate-Vit D-Min (CALCIUM 1200) 1200-1000 MG-UNIT CHEW Chew 1 tablet by mouth daily.     celecoxib (CELEBREX) 100 MG capsule TAKE 1 CAPSULE(100 MG) BY MOUTH TWICE DAILY 180 capsule 0   Cholecalciferol (VITAMIN D-3) 25 MCG (1000 UT) CAPS Take by mouth.     cyanocobalamin (VITAMIN B12) 1000 MCG tablet Take 1 tablet (1,000 mcg total) by mouth daily. 90 tablet 1   denosumab (PROLIA) 60 MG/ML SOSY injection Inject into the skin.     erythromycin ophthalmic ointment APPLY SMALL AMOUNT IN LEFT EYE TWICE DAILY AS DIRECTED     gabapentin (NEURONTIN) 600 MG tablet Take 1 tablet (600 mg total) by mouth 3 (three) times daily. 90 tablet 2   pravastatin (PRAVACHOL) 40 MG tablet TAKE 1 TABLET(40 MG) BY MOUTH DAILY 90 tablet 3   amLODipine (NORVASC) 10 MG tablet Take 1 tablet (10 mg total) by mouth daily. 90 tablet 1   buPROPion (WELLBUTRIN XL) 150 MG 24 hr tablet TAKE 1 TABLET(150 MG) BY MOUTH DAILY 90 tablet 1   losartan (COZAAR) 100 MG tablet TAKE 1 TABLET(100 MG) BY MOUTH DAILY 90 tablet 1   No facility-administered medications prior to visit.    Review of Systems;  Patient denies headache, fevers, malaise, unintentional weight  loss, skin rash, eye pain, sinus congestion and sinus pain, sore throat, dysphagia,  hemoptysis , cough, dyspnea, wheezing, chest pain, palpitations, orthopnea, edema, abdominal pain, nausea, melena, diarrhea, constipation, flank pain, dysuria, hematuria, urinary  Frequency, nocturia, numbness, tingling, seizures,  Focal weakness, Loss of consciousness,  Tremor, insomnia, depression, anxiety, and suicidal ideation.      Objective:  BP 136/74   Pulse 70   Ht 5' 2.75" (1.594 m)   Wt 102 lb (46.3 kg)   SpO2 97%   BMI 18.21 kg/m   BP Readings from Last 3 Encounters:  09/20/22 136/74  09/13/22 138/63  08/31/22 (!) 115/48    Wt Readings from Last 3 Encounters:  09/20/22 102 lb (46.3 kg)  09/13/22 101 lb (45.8 kg)  08/31/22 105 lb 14.4 oz (48 kg)    Physical  Exam Vitals reviewed.  Constitutional:      General: She is not in acute distress.    Appearance: Normal appearance. She is normal weight. She is not ill-appearing, toxic-appearing or diaphoretic.  HENT:     Head: Normocephalic.  Eyes:     General: No scleral icterus.       Right eye: No discharge.        Left eye: No discharge.     Conjunctiva/sclera: Conjunctivae normal.  Cardiovascular:     Rate and Rhythm: Normal rate and regular rhythm.     Heart sounds: Normal heart sounds.  Pulmonary:     Effort: Pulmonary effort is normal. No respiratory distress.     Breath sounds: Normal breath sounds.  Musculoskeletal:        General: Normal range of motion.     Left foot: Foot drop present.  Feet:     Comments: Right foot drop  Skin:    General: Skin is warm and dry.  Neurological:     Mental Status: She is alert and oriented to person, place, and time. Mental status is at baseline.     Cranial Nerves: Cranial nerve deficit present.     Motor: Weakness present.     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        Behavior: Behavior normal.        Thought Content: Thought content normal.        Judgment: Judgment normal.    Lab Results  Component Value Date   HGBA1C 6.2 03/17/2022   HGBA1C 6.6 (H) 08/25/2021   HGBA1C 6.4 09/10/2020    Lab Results  Component Value Date   CREATININE 0.62 08/26/2022   CREATININE 0.79 03/17/2022   CREATININE 0.71 10/28/2021    Lab Results  Component Value Date   WBC 7.0 08/26/2022   HGB 11.1 (L) 08/26/2022   HCT 34.5 (L) 08/26/2022   PLT 244 08/26/2022   GLUCOSE 102 (H) 08/26/2022   CHOL 179 03/17/2022   TRIG 66.0 03/17/2022   HDL 78.90 03/17/2022   LDLDIRECT 77.0 03/17/2022   LDLCALC 86 03/17/2022   ALT 14 08/26/2022   AST 17 08/26/2022   NA 138 08/26/2022   K 4.6 08/26/2022   CL 102 08/26/2022   CREATININE 0.62 08/26/2022   BUN 27 (H) 08/26/2022   CO2 25 08/26/2022    TSH 1.18 10/19/2017   HGBA1C 6.2 03/17/2022   MICROALBUR 2.3 (H) 03/17/2022    DG PAIN CLINIC C-ARM 1-60 MIN NO REPORT  Result Date: 09/13/2022 Fluoro was used, but no  Radiologist interpretation will be provided. Please refer to "NOTES" tab for provider progress note.   Assessment & Plan:  .Primary hypertension Assessment & Plan: She is having edema due to concurrent use of amlodipine and gabapentin .  Stopping amlodipine,  changing losartan 100 mg to telmisartan/hydrochlorothiazide  80/25.  Rn visit one week for follow up measurement.  Will add 5 mg amlodipine if not at goal .   DM type 2, goal HbA1c < 7% (HCC) -     Comprehensive metabolic panel; Future -     Hemoglobin A1c; Future  Pure hypercholesterolemia -     LDL cholesterol, direct; Future -     Lipid panel; Future  Other fatigue -     TSH; Future  Foot drop, right Assessment & Plan: secondary to  L4 nerve root impingement secondary to lumbar spinal stenosis.  EMG studies and neurosurgical consultation have been done and she has declined corrective surgery and is using a foot brace.    Degenerative disc disease, lumbar Assessment & Plan: noted on MRI spine done by neurosurgeon at [facility].  With spinal stenosis noted. . 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  She has  undergone  corrective surgery at [facility] with partial resolution .    Other orders -     amLODIPine Besylate; Take 1 tablet (5 mg total) by mouth daily.  Dispense: 90 tablet; Refill: 1 -     buPROPion HCl ER (XL); Take 1 tablet (150 mg total) by mouth daily.  Dispense: 90 tablet; Refill: 1 -     Telmisartan-HCTZ; Take 1 tablet by mouth daily.  Dispense: 90 tablet; Refill: 1     I provided 30  minutes of face-to-face time during this encounter reviewing patient's last visit with me, patient's  most recent visit with ENT, neurosurgery, pain management and neurology,  recent surgical and non surgical procedures, previous  labs and imaging studies, counseling on currently addressed issues,  and post visit ordering to diagnostics and therapeutics .   Follow-up: Return in about 1 week (around 09/27/2022).   [name], MD

## 2022-09-20 NOTE — Assessment & Plan Note (Signed)
noted on MRI spine done by neurosurgeon at [facility].  With spinal stenosis noted. . She is using gabapentin and ESI for pain management and has deferred surgical intervention at this time despite hav ing foot drop

## 2022-09-21 DIAGNOSIS — Z961 Presence of intraocular lens: Secondary | ICD-10-CM | POA: Diagnosis not present

## 2022-09-21 DIAGNOSIS — H26491 Other secondary cataract, right eye: Secondary | ICD-10-CM | POA: Diagnosis not present

## 2022-09-27 ENCOUNTER — Telehealth: Payer: Self-pay | Admitting: Student in an Organized Health Care Education/Training Program

## 2022-09-27 ENCOUNTER — Ambulatory Visit (INDEPENDENT_AMBULATORY_CARE_PROVIDER_SITE_OTHER): Payer: Medicare Other

## 2022-09-27 VITALS — BP 130/69 | HR 78

## 2022-09-27 DIAGNOSIS — R5383 Other fatigue: Secondary | ICD-10-CM

## 2022-09-27 DIAGNOSIS — E7800 Pure hypercholesterolemia, unspecified: Secondary | ICD-10-CM

## 2022-09-27 DIAGNOSIS — R944 Abnormal results of kidney function studies: Secondary | ICD-10-CM

## 2022-09-27 DIAGNOSIS — I10 Essential (primary) hypertension: Secondary | ICD-10-CM | POA: Diagnosis not present

## 2022-09-27 DIAGNOSIS — E119 Type 2 diabetes mellitus without complications: Secondary | ICD-10-CM

## 2022-09-27 LAB — COMPREHENSIVE METABOLIC PANEL
ALT: 11 U/L (ref 0–35)
AST: 12 U/L (ref 0–37)
Albumin: 4.3 g/dL (ref 3.5–5.2)
Alkaline Phosphatase: 38 U/L — ABNORMAL LOW (ref 39–117)
BUN: 29 mg/dL — ABNORMAL HIGH (ref 6–23)
CO2: 27 mEq/L (ref 19–32)
Calcium: 9.3 mg/dL (ref 8.4–10.5)
Chloride: 100 mEq/L (ref 96–112)
Creatinine, Ser: 0.92 mg/dL (ref 0.40–1.20)
GFR: 57.84 mL/min — ABNORMAL LOW (ref >60.00)
Glucose, Bld: 112 mg/dL — ABNORMAL HIGH (ref 70–99)
Potassium: 3.9 mEq/L (ref 3.5–5.1)
Sodium: 136 mEq/L (ref 135–145)
Total Bilirubin: 0.6 mg/dL (ref 0.2–1.2)
Total Protein: 6.4 g/dL (ref 6.0–8.3)

## 2022-09-27 LAB — LIPID PANEL
Cholesterol: 181 mg/dL (ref 0–200)
HDL: 62.1 mg/dL (ref >39.00)
LDL Cholesterol: 94 mg/dL (ref 0–99)
NonHDL: 118.52
Total CHOL/HDL Ratio: 3
Triglycerides: 121 mg/dL (ref 0.0–149.0)
VLDL: 24.2 mg/dL (ref 0.0–40.0)

## 2022-09-27 LAB — HEMOGLOBIN A1C: Hgb A1c MFr Bld: 6 % (ref 4.6–6.5)

## 2022-09-27 LAB — LDL CHOLESTEROL, DIRECT: Direct LDL: 103 mg/dL

## 2022-09-27 LAB — TSH: TSH: 0.5 u[IU]/mL (ref 0.35–5.50)

## 2022-09-27 NOTE — Telephone Encounter (Signed)
Informed patient that sometimes results from procedures are not identical to those of previous procedures. Instructed her to document on the post procedure diary and discuss at follow-up.

## 2022-09-27 NOTE — Progress Notes (Signed)
Pt presented for BP check and fasting labs. Pt was identified through two identifiers. After going over meds pt confirmed she takes telmisartan-hydrochlorothiazide (MICARDIS HCT) 80-25 MG and has been consistent.   Pt denies headaches, nausea, blurred vision, chest pain, SOB, dizziness, or numbness.  ~1st reading~ BP: 130/69 P: 78   Due to BP being within normal limits and pt not having any symptoms I released pt to the lab.

## 2022-09-27 NOTE — Telephone Encounter (Signed)
PT stated that before she had the injection done she would only have pain when got up in the morning. PT stated now she is having pain all day since the procedure and her pain is worst. Please give patient a call. TY

## 2022-09-29 ENCOUNTER — Telehealth: Payer: Self-pay | Admitting: Internal Medicine

## 2022-09-29 DIAGNOSIS — D2261 Melanocytic nevi of right upper limb, including shoulder: Secondary | ICD-10-CM | POA: Diagnosis not present

## 2022-09-29 DIAGNOSIS — Z85828 Personal history of other malignant neoplasm of skin: Secondary | ICD-10-CM | POA: Diagnosis not present

## 2022-09-29 DIAGNOSIS — D2262 Melanocytic nevi of left upper limb, including shoulder: Secondary | ICD-10-CM | POA: Diagnosis not present

## 2022-09-29 DIAGNOSIS — D225 Melanocytic nevi of trunk: Secondary | ICD-10-CM | POA: Diagnosis not present

## 2022-09-29 DIAGNOSIS — Z85820 Personal history of malignant melanoma of skin: Secondary | ICD-10-CM | POA: Diagnosis not present

## 2022-09-29 DIAGNOSIS — D2271 Melanocytic nevi of right lower limb, including hip: Secondary | ICD-10-CM | POA: Diagnosis not present

## 2022-09-29 NOTE — Telephone Encounter (Signed)
Patient called and said she had her labs done on Monday. She was looking at her results and she didn't see her A1C. She would like to know what her results was for her A1C. Her number is [phone].

## 2022-09-29 NOTE — Addendum Note (Signed)
Addended by: [name] on: 09/29/2022 12:00 PM   Modules accepted: Orders

## 2022-09-29 NOTE — Telephone Encounter (Signed)
Spoke with pt to let her know that her A1c result is 6.0. Pt gave a verbal understanding.

## 2022-09-30 ENCOUNTER — Telehealth: Payer: Self-pay | Admitting: Student in an Organized Health Care Education/Training Program

## 2022-09-30 NOTE — Telephone Encounter (Signed)
error 

## 2022-09-30 NOTE — Telephone Encounter (Signed)
PT states that she has some extra Gabapentin wants to know will it be okay for her to take some extra to help with her pain. Please give patient a call. TY

## 2022-09-30 NOTE — Telephone Encounter (Signed)
Per Dr. Cherylann Ratel patient may take one extra dose per day for a total of 2400 mg/day. Patient called, informed and instructed. Call for any issues with the increased dose.

## 2022-10-06 ENCOUNTER — Ambulatory Visit
Payer: Medicare Other | Attending: Student in an Organized Health Care Education/Training Program | Admitting: Student in an Organized Health Care Education/Training Program

## 2022-10-06 ENCOUNTER — Encounter: Payer: Self-pay | Admitting: Student in an Organized Health Care Education/Training Program

## 2022-10-06 VITALS — BP 121/55 | HR 80 | Temp 97.2°F | Resp 16 | Ht 62.75 in | Wt 99.0 lb

## 2022-10-06 DIAGNOSIS — M47816 Spondylosis without myelopathy or radiculopathy, lumbar region: Secondary | ICD-10-CM | POA: Diagnosis not present

## 2022-10-06 DIAGNOSIS — G894 Chronic pain syndrome: Secondary | ICD-10-CM

## 2022-10-06 MED ORDER — GABAPENTIN 600 MG PO TABS
600.0000 mg | ORAL_TABLET | Freq: Four times a day (QID) | ORAL | 2 refills | Status: DC
Start: 2022-10-06 — End: 2023-01-10

## 2022-10-06 NOTE — Progress Notes (Signed)
PROVIDER NOTE: Information contained herein reflects review and annotations entered in association with encounter. Interpretation of such information and data should be left to medically-trained personnel. Information provided to patient can be located elsewhere in the medical record under "Patient Instructions". Document created using STT-dictation technology, any transcriptional errors that may result from process are unintentional.    Patient: Rita Taylor  Service Category: E/M  Provider: Edward Jolly, MD  DOB: 08/29/1940  DOS: 10/06/2022  Referring Provider: [name], MD  MRN: [id number]  Specialty: Interventional Pain Management  PCP: [name], MD  Type: Established Patient  Setting: Ambulatory outpatient    Location: Office  Delivery: Face-to-face     HPI  [name], a 82 y.o. year old female, is here today because of her Lumbar facet arthropathy [M47.816]. [name] primary complain today is Back Pain  Pertinent problems: [name] does not have any pertinent problems on file. Pain Assessment: Severity of Other (Comment) is reported as a 6 /10. Location: Foot Right/Denies. Pain oter right foot, the heel, and outer 3 toes. Onset: 1 to 4 weeks ago. Quality: Burning, Tingling. Timing: Intermittent. Modifying factor(s): Denies. Vitals:  height is 5' 2.75" (1.594 m) and weight is 99 lb (44.9 kg). Her temporal temperature is 97.2 F (36.2 C) (abnormal). Her blood pressure is 121/55 (abnormal) and her pulse is 80. Her respiration is 16 and oxygen saturation is 100%.  BMI: Estimated body mass index is 17.68 kg/m as calculated from the following:   Height as of this encounter: 5' 2.75" (1.594 m).   Weight as of this encounter: 99 lb (44.9 kg). Last encounter: 08/24/2022. Last procedure: 09/13/2022.  Reason for encounter: post-procedure evaluation and assessment.   Post-procedure evaluation   Procedure: Sacroiliac Joint Steroid Injection and Bilateral Piriformis TPI  #1    Laterality: Bilateral     Level: PIIS (Posterior Inferior Iliac Spine)  Imaging: Fluoroscopic guidance Anesthesia: Local anesthesia (1-2% Lidocaine) DOS: 09/13/2022  Performed by: Edward Jolly, MD  Purpose: Diagnostic/Therapeutic Indications: Sacroiliac joint pain in the lower back and hip area severe enough to impact quality of life or function. Rationale (medical necessity): procedure needed and proper for the diagnosis and/or treatment of [name]'s medical symptoms and needs. 1. Sacroiliac joint pain   2. SI joint arthritis   3. Piriformis syndrome of both sides    NAS-11 Pain score:   Pre-procedure: 6 /10   Post-procedure: 0-No pain/10      Effectiveness:  Initial hour after procedure: 100 %  Subsequent 4-6 hours post-procedure: 100 %  Analgesia past initial 6 hours: 0 % (100% relief 1-2 days after; pain returned on Day 3)  Ongoing improvement: 0%    ROS  Constitutional: Denies any fever or chills Gastrointestinal: No reported hemesis, hematochezia, vomiting, or acute GI distress Musculoskeletal:  LBP Neurological: No reported episodes of acute onset apraxia, aphasia, dysarthria, agnosia, amnesia, paralysis, loss of coordination, or loss of consciousness  Medication Review  Aspirin, Biotin w/ Vitamins C & E, Calcium 1200, Vitamin D-3, buPROPion, celecoxib, cyanocobalamin, denosumab, erythromycin, gabapentin, pravastatin, and telmisartan-hydrochlorothiazide  History Review  Allergy: [name] is allergic to dextromethorphan-guaifenesin, fosamax [alendronate], pseudoephedrine-dm-gg, and risedronate sodium. Drug: [name]  reports no history of drug use. Alcohol:  reports current alcohol use of about 1.0 standard drink of alcohol per week. Tobacco:  reports that she has never smoked. She has never used smokeless tobacco. Social: [name]  reports that she has never smoked. She has never used  smokeless tobacco. She reports current alcohol use of about 1.0  standard drink of alcohol per week. She reports that she does not use drugs. Medical:  has a past medical history of History of shingles, Hyperlipidemia, Hypertension, Melanoma (HCC) (2008), Neurofibromatosis type II (HCC), Postoperative anemia (07/22/2018), and 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(HCC). Surgical: [name]  has a past surgical history that includes Gynecologic cryosurgery; Breast enhancement surgery (1990); bitubal ligation (1981); Colonoscopy; Facial cosmetic surgery; Tumor excision (Left, 06/2018); Ectropion repair (Left, 03/02/2019); and Brow lift (Left, 01/24/2020). Family: family history includes High Cholesterol in her father and mother; Hypertension in her brother, father, and mother.  Laboratory Chemistry Profile   Renal Lab Results  Component Value Date   BUN 29 (H) 09/27/2022   CREATININE 0.92 09/27/2022   GFR 57.84 (L) 09/27/2022   GFRAA 79 07/01/2014   GFRNONAA >60 08/26/2022    Hepatic Lab Results  Component Value Date   AST 12 09/27/2022   ALT 11 09/27/2022   ALBUMIN 4.3 09/27/2022   ALKPHOS 38 (L) 09/27/2022   HCVAB NEGATIVE 01/06/2015    Electrolytes Lab Results  Component Value Date   NA 136 09/27/2022   K 3.9 09/27/2022   CL 100 09/27/2022   CALCIUM 9.3 09/27/2022    Bone Lab Results  Component Value Date   VD25OH 45.05 03/10/2020    Inflammation (CRP: Acute Phase) (ESR: Chronic Phase) No results found for: "CRP", "ESRSEDRATE", "LATICACIDVEN"       Note: Above Lab results reviewed.   Narrative & Impression  CLINICAL DATA:  Lumbar radiculopathy. Bilateral foot drop and leg numbness.   EXAM: MRI LUMBAR SPINE WITHOUT CONTRAST   TECHNIQUE: Multiplanar, multisequence MR imaging of the lumbar spine was performed. No intravenous contrast was administered.   COMPARISON:  Lumbar spine radiographs 09/10/2021   FINDINGS: Segmentation:  Standard.   Alignment: Mild lumbar dextroscoliosis. Trace retrolisthesis of L2 on L3 and trace anterolisthesis of  L4 on L5.   Vertebrae: No fracture or significant marrow edema. Diffusely heterogeneous bone marrow signal with patchy T1 hypointensity throughout the lumbar spine.   Conus medullaris and cauda equina: Conus extends to the upper L1 level. Conus and cauda equina appear normal.   Paraspinal and other soft tissues: Small left renal cysts measuring up to 1 cm with no follow-up imaging recommended.   Disc levels:   Disc desiccation throughout the lumbar spine with only at most mild disc space narrowing.   T12-L1: Negative.   L1-2: Mild disc bulging and mild facet hypertrophy without stenosis.   L2-3: Disc bulging and moderate left greater than right facet and ligamentum flavum hypertrophy result in mild left greater than right lateral recess and neural foraminal stenosis without spinal stenosis.   L3-4: Disc bulging, a left foraminal disc protrusion, and moderate facet and ligamentum flavum hypertrophy result in mild-to-moderate spinal stenosis, moderate bilateral lateral recess stenosis, and mild right and moderate left neural foraminal stenosis. Potential left L3 and bilateral L4 nerve root impingement.   L4-5: Anterolisthesis with bulging uncovered disc and severe facet and ligamentum flavum hypertrophy result in moderate spinal stenosis, moderate bilateral lateral recess stenosis, and mild right and moderate left neural foraminal stenosis. Potential left L4 and bilateral L5 nerve root impingement.   L5-S1: Disc bulging and mild facet hypertrophy result in mild right greater than left lateral recess stenosis and mild-to-moderate right and mild left neural foraminal stenosis without spinal stenosis.   IMPRESSION: 1. Multilevel lumbar disc and facet degeneration, most notable at L4-5 where there is  severe facet arthrosis with grade 1 anterolisthesis, moderate spinal stenosis, and moderate left neural foraminal stenosis. 2. Mild-to-moderate spinal and moderate lateral recess  stenosis at L3-4. 3. Mild-to-moderate neural foraminal stenosis at L3-4 and L5-S1. 4. Diffusely heterogeneous bone marrow signal which is nonspecific, however malignant/infiltrative processes can have this appearance in addition to benign processes (anemia, advancing age, and smoking). Correlate with CBC, and if the patient has a history of malignancy, consider obtaining a nuclear medicine whole-body bone scan for further evaluation. Laboratory evaluation for multiple myeloma could also be considered.      Physical Exam  General appearance: Well nourished, well developed, and well hydrated. In no apparent acute distress Mental status: Alert, oriented x 3 (person, place, & time)       Respiratory: No evidence of acute respiratory distress Eyes: PERLA Vitals: BP (!) 121/55   Pulse 80   Temp (!) 97.2 F (36.2 C) (Temporal)   Resp 16   Ht 5' 2.75" (1.594 m)   Wt 99 lb (44.9 kg)   SpO2 100%   BMI 17.68 kg/m  BMI: Estimated body mass index is 17.68 kg/m as calculated from the following:   Height as of this encounter: 5' 2.75" (1.594 m).   Weight as of this encounter: 99 lb (44.9 kg). Ideal: Female patients must weigh at least 45.5 kg to calculate ideal body weight  Lumbar Spine Area Exam  Skin & Axial Inspection: No masses, redness, or swelling Alignment: Symmetrical Functional ROM: Pain restricted ROM affecting both sides Stability: No instability detected Muscle Tone/Strength: Functionally intact. No obvious neuro-muscular anomalies detected. Sensory (Neurological): Musculoskeletal pain pattern Palpation: No palpable anomalies       Provocative Tests: Hyperextension/rotation test: (+) bilaterally for facet joint pain. Lumbar quadrant test (Kemp's test): (+) bilaterally for facet joint pain.  Gait & Posture Assessment  Ambulation: Patient came in today in a wheel chair Gait: Antalgic gait (limping) Posture: Difficulty standing up straight, due to pain  Lower Extremity  Exam    Side: Right lower extremity  Side: Left lower extremity  Stability: No instability observed          Stability: No instability observed          Skin & Extremity Inspection: Skin color, temperature, and hair growth are WNL. No peripheral edema or cyanosis. No masses, redness, swelling, asymmetry, or associated skin lesions. No contractures.  Skin & Extremity Inspection: Skin color, temperature, and hair growth are WNL. No peripheral edema or cyanosis. No masses, redness, swelling, asymmetry, or associated skin lesions. No contractures.  Functional ROM: Unrestricted ROM                  Functional ROM: Unrestricted ROM                  Muscle Tone/Strength: Functionally intact. No obvious neuro-muscular anomalies detected.  Muscle Tone/Strength: Functionally intact. No obvious neuro-muscular anomalies detected.  Sensory (Neurological): Unimpaired        Sensory (Neurological): Unimpaired        DTR: Patellar: deferred today Achilles: deferred today Plantar: deferred today  DTR: Patellar: deferred today Achilles: deferred today Plantar: deferred today  Palpation: No palpable anomalies  Palpation: No palpable anomalies    Assessment   Diagnosis Status  1. Lumbar facet arthropathy   2. Lumbar spondylosis   3. Chronic pain syndrome    Persistent Persistent Controlled     Plan of Care  Rita Taylor has a history of greater than 3 months  of moderate to severe pain which is resulted in functional impairment.  The patient has tried various conservative therapeutic options such as NSAIDs, Tylenol, muscle relaxants, physical therapy which was inadequately effective.  Patient's pain is predominantly axial with physical exam and Lumbar MRI findings suggestive of facet arthropathy. Lumbar facet medial branch nerve blocks were discussed with the patient.  Risks and benefits were reviewed.  Patient would like to proceed with bilateral L3, L4, L5 medial branch nerve block.   [name] has a current medication list which includes the following long-term medication(s): bupropion, calcium 1200, pravastatin, telmisartan-hydrochlorothiazide, and gabapentin.  Pharmacotherapy (Medications Ordered): Meds ordered this encounter  Medications   gabapentin (NEURONTIN) 600 MG tablet    Sig: Take 1 tablet (600 mg total) by mouth in the morning, at noon, in the evening, and at bedtime.    Dispense:  120 tablet    Refill:  2   Orders:  Orders Placed This Encounter  Procedures   LUMBAR FACET(MEDIAL BRANCH NERVE BLOCK) MBNB    Standing Status:   Future    Standing Expiration Date:   01/06/2023    Scheduling Instructions:     Procedure: Lumbar facet block (AKA.: Lumbosacral medial branch nerve block)     Side: Bilateral     Level: L3-4, L4-5, Facets (L3, L4, L5, Medial Branch)     Sedation: without     Timeframe: ASAA    Order Specific Question:   Where will this procedure be performed?    Answer:   [facility]   Follow-up plan:   Return in about 19 days (around 10/25/2022) for B/L L3, 4, 5 MBNB, in clinic NS.      Left L4/5 ESI; bilateral SI joint and piriformis 09/13/2022     Recent Visits Date Type Provider Dept  09/13/22 Procedure visit Edward Jolly, MD [facility]  08/24/22 Office Visit Edward Jolly, MD [facility]  07/19/22 Office Visit Edward Jolly, MD [facility]  Showing recent visits within past 90 days and meeting all other requirements Today's Visits Date Type Provider Dept  10/06/22 Office Visit Edward Jolly, MD [facility]  Showing today's visits and meeting all other requirements Future Appointments No visits were found meeting these conditions. Showing future appointments within next 90 days and meeting all other requirements  I discussed the assessment and treatment plan with the patient. The patient was provided an opportunity to ask questions and all were answered. The patient agreed with the  plan and demonstrated an understanding of the instructions.  Patient advised to call back or seek an in-person evaluation if the symptoms or condition worsens.  Duration of encounter: .  Total time on encounter, as per AMA guidelines included both the face-to-face and non-face-to-face time personally spent by the physician and/or other qualified health care professional(s) on the day of the encounter (includes time in activities that require the physician or other qualified health care professional and does not include time in activities normally performed by clinical staff). Physician's time may include the following activities when performed: Preparing to see the patient (e.g., pre-charting review of records, searching for previously ordered imaging, lab work, and nerve conduction tests) Review of prior analgesic pharmacotherapies. Reviewing PMP Interpreting ordered tests (e.g., lab work, imaging, nerve conduction tests) Performing post-procedure evaluations, including interpretation of diagnostic procedures Obtaining and/or reviewing separately obtained history Performing a medically appropriate examination and/or evaluation Counseling and educating the patient/family/caregiver Ordering medications, tests, or procedures Referring  and communicating with other health care professionals (when not separately reported) Documenting clinical information in the electronic or other health record Independently interpreting results (not separately reported) and communicating results to the patient/ family/caregiver Care coordination (not separately reported)  Note by: Edward Jolly, MD Date: 10/06/2022; Time: 3:35 PM

## 2022-10-06 NOTE — Progress Notes (Signed)
Safety precautions to be maintained throughout the outpatient stay will include: orient to surroundings, keep bed in low position, maintain call bell within reach at all times, provide assistance with transfer out of bed and ambulation.  

## 2022-10-06 NOTE — Patient Instructions (Signed)
Facet Blocks Patient Information  Description: The facets are joints in the spine between the vertebrae.  Like any joints in the body, facets can become irritated and painful.  Arthritis can also effect the facets.  By injecting steroids and local anesthetic in and around these joints, we can temporarily block the nerve supply to them.  Steroids act directly on irritated nerves and tissues to reduce selling and inflammation which often leads to decreased pain.  Facet blocks may be done anywhere along the spine from the neck to the low back depending upon the location of your pain.   After numbing the skin with local anesthetic (like Novocaine), a small needle is passed onto the facet joints under x-ray guidance.  You may experience a sensation of pressure while this is being done.  The entire block usually lasts about 15-25 minutes.   Conditions which may be treated by facet blocks:  Low back/buttock pain Neck/shoulder pain Certain types of headaches  Preparation for the injection:  Do not eat any solid food or dairy products within 8 hours of your appointment. You may drink clear liquid up to 3 hours before appointment.  Clear liquids include water, black coffee, juice or soda.  No milk or cream please. You may take your regular medication, including pain medications, with a sip of water before your appointment.  Diabetics should hold regular insulin (if taken separately) and take 1/2 normal NPH dose the morning of the procedure.  Carry some sugar containing items with you to your appointment. A driver must accompany you and be prepared to drive you home after your procedure. Bring all your current medications with you. An IV may be inserted and sedation may be given at the discretion of the physician. A blood pressure cuff, EKG and other monitors will often be applied during the procedure.  Some patients may need to have extra oxygen administered for a short period. You will be asked to  provide medical information, including your allergies and medications, prior to the procedure.  We must know immediately if you are taking blood thinners (like Coumadin/Warfarin) or if you are allergic to IV iodine contrast (dye).  We must know if you could possible be pregnant.  Possible side-effects:  Bleeding from needle site Infection (rare, may require surgery) Nerve injury (rare) Numbness & tingling (temporary) Difficulty urinating (rare, temporary) Spinal headache (a headache worse with upright posture) Light-headedness (temporary) Pain at injection site (serveral days) Decreased blood pressure (rare, temporary) Weakness in arm/leg (temporary) Pressure sensation in back/neck (temporary)   Call if you experience:  Fever/chills associated with headache or increased back/neck pain Headache worsened by an upright position New onset, weakness or numbness of an extremity below the injection site Hives or difficulty breathing (go to the emergency room) Inflammation or drainage at the injection site(s) Severe back/neck pain greater than usual New symptoms which are concerning to you  Please note:  Although the local anesthetic injected can often make your back or neck feel good for several hours after the injection, the pain will likely return. It takes 3-7 days for steroids to work.  You may not notice any pain relief for at least one week.  If effective, we will often do a series of 2-3 injections spaced 3-6 weeks apart to maximally decrease your pain.  After the initial series, you may be a candidate for a more permanent nerve block of the facets.  If you have any questions, please call #[phone] [facility]  Procedure instructions  Do not eat or drink fluids (other than water) for 6 hours before your procedure  No water for 2 hours before your procedure  Take your blood pressure medicine with a sip of water  Arrive 30 minutes before  your appointment  Carefully read the "Preparing for your procedure" detailed instructions  If you have questions call us at [phone]  _____________________________________________________________________    ______________________________________________________________________  Preparing for your procedure  Appointments: If you think you may not be able to keep your appointment, call 24-48 hours in advance to cancel. We need time to make it available to others.  During your procedure appointment there will be: No Prescription Refills. No disability issues to discussed. No medication changes or discussions.  Instructions: Food intake: Avoid eating anything solid for at least 8 hours prior to your procedure. Clear liquid intake: You may take clear liquids such as water up to 2 hours prior to your procedure. (No carbonated drinks. No soda.) Transportation: Unless otherwise stated by your physician, bring a driver. Morning Medicines: Except for blood thinners, take all of your other morning medications with a sip of water. Make sure to take your heart and blood pressure medicines. If your blood pressure's lower number is above 100, the case will be rescheduled. Blood thinners: Make sure to stop your blood thinners as instructed.  If you take a blood thinner, but were not instructed to stop it, call our office [phone] and ask to talk to a nurse. Not stopping a blood thinner prior to certain procedures could lead to serious complications. Diabetics on insulin: Notify the staff so that you can be scheduled 1st case in the morning. If your diabetes requires high dose insulin, take only  of your normal insulin dose the morning of the procedure and notify the staff that you have done so. Preventing infections: Shower with an antibacterial soap the morning of your procedure.  Build-up your immune system: Take 1000 mg of Vitamin C with every meal (3 times a day) the day prior to your  procedure. Antibiotics: Inform the nursing staff if you are taking any antibiotics or if you have any conditions that may require antibiotics prior to procedures. (Example: recent joint implants)   Pregnancy: If you are pregnant make sure to notify the nursing staff. Not doing so may result in injury to the fetus, including death.  Sickness: If you have a cold, fever, or any active infections, call and cancel or reschedule your procedure. Receiving steroids while having an infection may result in complications. Arrival: You must be in the facility at least 30 minutes prior to your scheduled procedure. Tardiness: Your scheduled time is also the cutoff time. If you do not arrive at least 15 minutes prior to your procedure, you will be rescheduled.  Children: Do not bring any children with you. Make arrangements to keep them home. Dress appropriately: There is always a possibility that your clothing may get soiled. Avoid long dresses. Valuables: Do not bring any jewelry or valuables.  Reasons to call and reschedule or cancel your procedure: (Following these recommendations will minimize the risk of a serious complication.) Surgeries: Avoid having procedures within 2 weeks of any surgery. (Avoid for 2 weeks before or after any surgery). Flu Shots: Avoid having procedures within 2 weeks of a flu shots or . (Avoid for 2 weeks before or after immunizations). Barium: Avoid having a procedure within 7-10 days after having had a radiological study involving the  use of radiological contrast. (Myelograms, Barium swallow or enema study). Heart attacks: Avoid any elective procedures or surgeries for the initial 6 months after a "Myocardial Infarction" (Heart Attack). Blood thinners: It is imperative that you stop these medications before procedures. Let us know if you if you take any blood thinner.  Infection: Avoid procedures during or within two weeks of an infection (including chest colds or gastrointestinal  problems). Symptoms associated with infections include: Localized redness, fever, chills, night sweats or profuse sweating, burning sensation when voiding, cough, congestion, stuffiness, runny nose, sore throat, diarrhea, nausea, vomiting, cold or Flu symptoms, recent or current infections. It is specially important if the infection is over the area that we intend to treat. Heart and lung problems: Symptoms that may suggest an active cardiopulmonary problem include: cough, chest pain, breathing difficulties or shortness of breath, dizziness, ankle swelling, uncontrolled high or unusually low blood pressure, and/or palpitations. If you are experiencing any of these symptoms, cancel your procedure and contact your primary care physician for an evaluation.  Remember:  Regular Business hours are:  Monday to Thursday 8:00 AM to 4:00 PM  Provider's Schedule: Delano Metz, MD:  Procedure days: Tuesday and Thursday 7:30 AM to 4:00 PM  Edward Jolly, MD:  Procedure days: Monday and Wednesday 7:30 AM to 4:00 PM

## 2022-10-08 ENCOUNTER — Encounter: Payer: Self-pay | Admitting: Internal Medicine

## 2022-10-13 ENCOUNTER — Telehealth: Payer: Self-pay

## 2022-10-13 NOTE — Telephone Encounter (Signed)
She is having a lot of pain and she wants to know if she can take more of her medication. I am waiting on prior auth for her procedure.

## 2022-10-13 NOTE — Telephone Encounter (Signed)
Spoke with patient and told her that I am unable to instruct her to take additional gabapentin.  She reports that she is taking the maximum dose of tylenol as well.  I know that she awaiting PA for procedure with BL.  I told her the only other thing I could recommend were topicals, ice/heat or warm baths/showers.  I also told her that if she feels like she needs to increase her gabapentin, that would need to be conversation with Dr Cherylann Ratel.  Patient verbalizes u/o information.

## 2022-11-01 ENCOUNTER — Ambulatory Visit
Payer: Medicare Other | Attending: Student in an Organized Health Care Education/Training Program | Admitting: Student in an Organized Health Care Education/Training Program

## 2022-11-01 ENCOUNTER — Ambulatory Visit
Admission: RE | Admit: 2022-11-01 | Discharge: 2022-11-01 | Disposition: A | Discharge status: Home / Self Care | Payer: Medicare Other | Source: Ambulatory Visit | Attending: Student in an Organized Health Care Education/Training Program | Admitting: Student in an Organized Health Care Education/Training Program

## 2022-11-01 ENCOUNTER — Encounter: Payer: Self-pay | Admitting: Student in an Organized Health Care Education/Training Program

## 2022-11-01 DIAGNOSIS — G894 Chronic pain syndrome: Secondary | ICD-10-CM | POA: Insufficient documentation

## 2022-11-01 DIAGNOSIS — M47816 Spondylosis without myelopathy or radiculopathy, lumbar region: Secondary | ICD-10-CM | POA: Insufficient documentation

## 2022-11-01 MED ORDER — ROPIVACAINE HCL 2 MG/ML IJ SOLN
9.0000 mL | Freq: Once | INTRAMUSCULAR | Status: DC
  Filled 2022-11-01: qty 20

## 2022-11-01 MED ORDER — LIDOCAINE HCL 2 % IJ SOLN
20.0000 mL | Freq: Once | INTRAMUSCULAR | Status: AC
  Administered 2022-11-01: 400 mg
  Filled 2022-11-01: qty 20

## 2022-11-01 MED ORDER — DEXAMETHASONE SODIUM PHOSPHATE 10 MG/ML IJ SOLN
20.0000 mg | Freq: Once | INTRAMUSCULAR | Status: AC
  Administered 2022-11-01: 20 mg
  Filled 2022-11-01: qty 2

## 2022-11-01 MED ORDER — ROPIVACAINE HCL 2 MG/ML IJ SOLN
9.0000 mL | Freq: Once | INTRAMUSCULAR | Status: AC
  Administered 2022-11-01: 18 mL via PERINEURAL
  Filled 2022-11-01: qty 20

## 2022-11-01 NOTE — Progress Notes (Signed)
Safety precautions to be maintained throughout the outpatient stay will include: orient to surroundings, keep bed in low position, maintain call bell within reach at all times, provide assistance with transfer out of bed and ambulation.  

## 2022-11-01 NOTE — Progress Notes (Signed)
PROVIDER NOTE: Interpretation of information contained herein should be left to medically-trained personnel. Specific patient instructions are provided elsewhere under "Patient Instructions" section of medical record. This document was created in part using STT-dictation technology, any transcriptional errors that may result from this process are unintentional.  Patient: Rita Taylor Type: Established DOB: 1940/02/23 MRN: [id number] PCP: [name], MD  Service: Procedure DOS: 11/01/2022 Setting: Ambulatory Location: Ambulatory outpatient facility Delivery: Face-to-face Provider: Edward Jolly, MD Specialty: Interventional Pain Management Specialty designation: 09 Location: Outpatient facility Ref. Prov.: Edward Jolly, MD       Interventional Therapy   Procedure: Lumbar Facet, Medial Branch Block(s) #1  Laterality: Bilateral  Level: L3, L4, and L5 Medial Branch Level(s). Injecting these levels blocks the L3-4 and L4-5 lumbar facet joints. Imaging: Fluoroscopic guidance         Anesthesia: Local anesthesia (1-2% Lidocaine) Sedation: No Sedation                       DOS: 11/01/2022 Performed by: Edward Jolly, MD  Primary Purpose: Diagnostic/Therapeutic Indications: Low back pain severe enough to impact quality of life or function. 1. Lumbar facet arthropathy   2. Lumbar spondylosis   3. Chronic pain syndrome    NAS-11 Pain score:   Pre-procedure: 10-Worst pain ever/10   Post-procedure: 0-No pain/10     Position / Prep / Materials:  Position: Prone  Prep solution: DuraPrep (Iodine Povacrylex [0.7% available iodine] and Isopropyl Alcohol, 74% w/w) Area Prepped: Posterolateral Lumbosacral Spine (Wide prep: From the lower border of the scapula down to the end of the tailbone and from flank to flank.)  Materials:  Tray: Block Needle(s):  Type: Spinal  Gauge (G): 22  Length: 3.5-in Qty: 2      H&P (Pre-op Assessment):  Rita Taylor is a 82 y.o. (year old), female  patient, seen today for interventional treatment. She  has a past surgical history that includes Gynecologic cryosurgery; Breast enhancement surgery (1990); bitubal ligation (1981); Colonoscopy; Facial cosmetic surgery; Tumor excision (Left, 06/2018); Ectropion repair (Left, 03/02/2019); and Brow lift (Left, 01/24/2020). Rita Taylor has a current medication list which includes the following prescription(s): aspirin, biotin w/ vitamins c & e, bupropion, calcium 1200, celecoxib, vitamin d-3, cyanocobalamin, denosumab, erythromycin, gabapentin, pravastatin, and telmisartan-hydrochlorothiazide, and the following Facility-Administered Medications: ropivacaine (pf) 2 mg/ml (0.2%). Her primarily concern today is the Back Pain (Lower back and trunk area)  Initial Vital Signs:  Pulse/HCG Rate: 72ECG Heart Rate: 74 Temp: (!) 97.3 F (36.3 C) Resp: 15 BP: (!) 140/64 SpO2: 100 %  BMI: Estimated body mass index is 18.29 kg/m as calculated from the following:   Height as of this encounter: 5\' 2"  (1.575 m).   Weight as of this encounter: 100 lb (45.4 kg).  Risk Assessment: Allergies: Reviewed. She is allergic to dextromethorphan-guaifenesin, fosamax [alendronate], pseudoephedrine-dm-gg, and risedronate sodium.  Allergy Precautions: None required Coagulopathies: Reviewed. None identified.  Blood-thinner therapy: None at this time Active Infection(s): Reviewed. None identified. Rita Taylor is afebrile  Site Confirmation: Rita Taylor was asked to confirm the procedure and laterality before marking the site Procedure checklist: Completed Consent: Before the procedure and under the influence of no sedative(s), amnesic(s), or anxiolytics, the patient was informed of the treatment options, risks and possible complications. To fulfill our ethical and legal obligations, as recommended by the American Medical Association's Code of Ethics, I have informed the patient of my clinical impression; the nature and purpose of the  treatment or procedure;  the risks, benefits, and possible complications of the intervention; the alternatives, including doing nothing; the risk(s) and benefit(s) of the alternative treatment(s) or procedure(s); and the risk(s) and benefit(s) of doing nothing. The patient was provided information about the general risks and possible complications associated with the procedure. These may include, but are not limited to: failure to achieve desired goals, infection, bleeding, organ or nerve damage, allergic reactions, paralysis, and death. In addition, the patient was informed of those risks and complications associated to Spine-related procedures, such as failure to decrease pain; infection (i.e.: Meningitis, epidural or intraspinal abscess); bleeding (i.e.: epidural hematoma, subarachnoid hemorrhage, or any other type of intraspinal or peri-dural bleeding); organ or nerve damage (i.e.: Any type of peripheral nerve, nerve root, or spinal cord injury) with subsequent damage to sensory, motor, and/or autonomic systems, resulting in permanent pain, numbness, and/or weakness of one or several areas of the body; allergic reactions; (i.e.: anaphylactic reaction); and/or death. Furthermore, the patient was informed of those risks and complications associated with the medications. These include, but are not limited to: allergic reactions (i.e.: anaphylactic or anaphylactoid reaction(s)); adrenal axis suppression; blood sugar elevation that in diabetics may result in ketoacidosis or comma; water retention that in patients with history of congestive heart failure may result in shortness of breath, pulmonary edema, and decompensation with resultant heart failure; weight gain; swelling or edema; medication-induced neural toxicity; particulate matter embolism and blood vessel occlusion with resultant organ, and/or nervous system infarction; and/or aseptic necrosis of one or more joints. Finally, the patient was informed that  Medicine is not an exact science; therefore, there is also the possibility of unforeseen or unpredictable risks and/or possible complications that may result in a catastrophic outcome. The patient indicated having understood very clearly. We have given the patient no guarantees and we have made no promises. Enough time was given to the patient to ask questions, all of which were answered to the patient's satisfaction. Rita Taylor has indicated that she wanted to continue with the procedure. Attestation: I, the ordering provider, attest that I have discussed with the patient the benefits, risks, side-effects, alternatives, likelihood of achieving goals, and potential problems during recovery for the procedure that I have provided informed consent. Date  Time: 11/01/2022  9:34 AM   Pre-Procedure Preparation:  Monitoring: As per clinic protocol. Respiration, ETCO2, SpO2, BP, heart rate and rhythm monitor placed and checked for adequate function Safety Precautions: Patient was assessed for positional comfort and pressure points before starting the procedure. Time-out: I initiated and conducted the "Time-out" before starting the procedure, as per protocol. The patient was asked to participate by confirming the accuracy of the "Time Out" information. Verification of the correct person, site, and procedure were performed and confirmed by me, the nursing staff, and the patient. "Time-out" conducted as per Joint Commission's Universal Protocol (UP.01.01.01). Time: 0937 Start Time: 0937 hrs.  Description of Procedure:          Laterality: (see above) Targeted Levels: (see above)  Safety Precautions: Aspiration looking for blood return was conducted prior to all injections. At no point did we inject any substances, as a needle was being advanced. Before injecting, the patient was told to immediately notify me if she was experiencing any new onset of "ringing in the ears, or metallic taste in the mouth". No  attempts were made at seeking any paresthesias. Safe injection practices and needle disposal techniques used. Medications properly checked for expiration dates. SDV (single dose vial) medications used. After the completion  of the procedure, all disposable equipment used was discarded in the proper designated medical waste containers. Local Anesthesia: Protocol guidelines were followed. The patient was positioned over the fluoroscopy table. The area was prepped in the usual manner. The time-out was completed. The target area was identified using fluoroscopy. A 12-in long, straight, sterile hemostat was used with fluoroscopic guidance to locate the targets for each level blocked. Once located, the skin was marked with an approved surgical skin marker. Once all sites were marked, the skin (epidermis, dermis, and hypodermis), as well as deeper tissues (fat, connective tissue and muscle) were infiltrated with a small amount of a short-acting local anesthetic, loaded on a 10cc syringe with a 25G, 1.5-in  Needle. An appropriate amount of time was allowed for local anesthetics to take effect before proceeding to the next step. Local Anesthetic: Lidocaine 2.0% The unused portion of the local anesthetic was discarded in the proper designated containers. Technical description of process:  L3 Medial Branch Nerve Block (MBB): The target area for the L3 medial branch is at the junction of the postero-lateral aspect of the superior articular process and the superior, posterior, and medial edge of the transverse process of L4. Under fluoroscopic guidance, a Quincke needle was inserted until contact was made with os over the superior postero-lateral aspect of the pedicular shadow (target area). After negative aspiration for blood, 2mL of the nerve block solution was injected without difficulty or complication. The needle was removed intact. L4 Medial Branch Nerve Block (MBB): The target area for the L4 medial branch is at the  junction of the postero-lateral aspect of the superior articular process and the superior, posterior, and medial edge of the transverse process of L5. Under fluoroscopic guidance, a Quincke needle was inserted until contact was made with os over the superior postero-lateral aspect of the pedicular shadow (target area). After negative aspiration for blood, 2mL of the nerve block solution was injected without difficulty or complication. The needle was removed intact. L5 Medial Branch Nerve Block (MBB): The target area for the L5 medial branch is at the junction of the postero-lateral aspect of the superior articular process and the superior, posterior, and medial edge of the sacral ala. Under fluoroscopic guidance, a Quincke needle was inserted until contact was made with os over the superior postero-lateral aspect of the pedicular shadow (target area). After negative aspiration for blood, 2mL of the nerve block solution was injected without difficulty or complication. The needle was removed intact.   Once the entire procedure was completed, the treated area was cleaned, making sure to leave some of the prepping solution back to take advantage of its long term bactericidal properties.         Illustration of the posterior view of the lumbar spine and the posterior neural structures. Laminae of L2 through S1 are labeled. DPRL5, dorsal primary ramus of L5; DPRS1, dorsal primary ramus of S1; DPR3, dorsal primary ramus of L3; FJ, facet (zygapophyseal) joint L3-L4; I, inferior articular process of L4; LB1, lateral branch of dorsal primary ramus of L1; IAB, inferior articular branches from L3 medial branch (supplies L4-L5 facet joint); IBP, intermediate branch plexus; MB3, medial branch of dorsal primary ramus of L3; NR3, third lumbar nerve root; S, superior articular process of L5; SAB, superior articular branches from L4 (supplies L4-5 facet joint also); TP3, transverse process of L3.   Facet Joint  Innervation (* possible contribution)  L1-2 T12, L1 (L2*)  Medial Branch  L2-3 L1, L2 (L3*)         "          "  L3-4 L2, L3 (L4*)         "          "  L4-5 L3, L4 (L5*)         "          "  L5-S1 L4, L5, S1          "          "    Vitals:   11/01/22 1030 11/01/22 1035 11/01/22 1040 11/01/22 1045  BP: (!) 180/74 (!) 144/69 (!) 158/83 (!) 163/72  Pulse:      Resp: 15 16 12 12   Temp:      TempSrc:      SpO2: 100% 100% 100% 100%  Weight:      Height:         End Time: 1045 hrs.  Imaging Guidance (Spinal):          Type of Imaging Technique: Fluoroscopy Guidance (Spinal) Indication(s): Assistance in needle guidance and placement for procedures requiring needle placement in or near specific anatomical locations not easily accessible without such assistance. Exposure Time: Please see nurses notes. Contrast: None used. Fluoroscopic Guidance: I was personally present during the use of fluoroscopy. "Tunnel Vision Technique" used to obtain the best possible view of the target area. Parallax error corrected before commencing the procedure. "Direction-depth-direction" technique used to introduce the needle under continuous pulsed fluoroscopy. Once target was reached, antero-posterior, oblique, and lateral fluoroscopic projection used confirm needle placement in all planes. Images permanently stored in EMR. Interpretation: No contrast injected. I personally interpreted the imaging intraoperatively. Adequate needle placement confirmed in multiple planes. Permanent images saved into the patient's record.  Post-operative Assessment:  Post-procedure Vital Signs:  Pulse/HCG Rate: 7273 Temp: (!) 97.3 F (36.3 C) Resp: 12 BP: (!) 163/72 SpO2: 100 %  EBL: None  Complications: No immediate post-treatment complications observed by team, or reported by patient.  Note: The patient tolerated the entire procedure well. A repeat set of vitals were taken after the procedure and the patient was kept  under observation following institutional policy, for this type of procedure. Post-procedural neurological assessment was performed, showing return to baseline, prior to discharge. The patient was provided with post-procedure discharge instructions, including a section on how to identify potential problems. Should any problems arise concerning this procedure, the patient was given instructions to immediately contact us, at any time, without hesitation. In any case, we plan to contact the patient by telephone for a follow-up status report regarding this interventional procedure.  Comments:  No additional relevant information.  Plan of Care (POC)  Orders:  Orders Placed This Encounter  Procedures   DG PAIN CLINIC C-ARM 1-60 MIN NO REPORT    Intraoperative interpretation by procedural physician at [facility].    Standing Status:   Standing    Number of Occurrences:   1    Order Specific Question:   Reason for exam:    Answer:   Assistance in needle guidance and placement for procedures requiring needle placement in or near specific anatomical locations not easily accessible without such assistance.    Medications ordered for procedure: Meds ordered this encounter  Medications   lidocaine (XYLOCAINE) 2 % (with pres) injection 400 mg   dexamethasone (DECADRON) injection 20 mg   ropivacaine (PF) 2 mg/mL (0.2%) (NAROPIN) injection 9 mL   ropivacaine (PF) 2 mg/mL (0.2%) (NAROPIN) injection 9 mL   Medications administered: We administered lidocaine, dexamethasone, and ropivacaine (PF) 2 mg/mL (  0.2%).  See the medical record for exact dosing, route, and time of administration.  Follow-up plan:   Return in about 4 weeks (around 11/29/2022), or F2F PPE.       Left L4/5 ESI; bilateral SI joint and piriformis 09/13/2022. BLF L3,4,5 11/01/22      Recent Visits Date Type Provider Dept  10/06/22 Office Visit Edward Jolly, MD [facility]  09/13/22 Procedure visit Edward Jolly, MD [facility]  08/24/22 Office Visit Edward Jolly, MD [facility]  Showing recent visits within past 90 days and meeting all other requirements Today's Visits Date Type Provider Dept  11/01/22 Procedure visit Edward Jolly, MD [facility]  Showing today's visits and meeting all other requirements Future Appointments Date Type Provider Dept  11/29/22 Appointment Edward Jolly, MD [facility]  Showing future appointments within next 90 days and meeting all other requirements  Disposition: Discharge home  Discharge (Date  Time): 11/01/2022; 1048 hrs.   Primary Care Physician: [name], MD Location: [facility] Note by: Edward Jolly, MD (TTS technology used. I apologize for any typographical errors that were not detected and corrected.) Date: 11/01/2022; Time: 11:45 AM  Disclaimer:  Medicine is not an Visual merchandiser. The only guarantee in medicine is that nothing is guaranteed. It is important to note that the decision to proceed with this intervention was based on the information collected from the patient. The Data and conclusions were drawn from the patient's questionnaire, the interview, and the physical examination. Because the information was provided in large part by the patient, it cannot be guaranteed that it has not been purposely or unconsciously manipulated. Every effort has been made to obtain as much relevant data as possible for this evaluation. It is important to note that the conclusions that lead to this procedure are derived in large part from the available data. Always take into account that the treatment will also be dependent on availability of resources and existing treatment guidelines, considered by other Pain Management Practitioners as being common knowledge and practice, at the time of the intervention. For Medico-Legal purposes, it is also important to point out that variation in  procedural techniques and pharmacological choices are the acceptable norm. The indications, contraindications, technique, and results of the above procedure should only be interpreted and judged by a Board-Certified Interventional Pain Specialist with extensive familiarity and expertise in the same exact procedure and technique.

## 2022-11-01 NOTE — Patient Instructions (Signed)

## 2022-11-02 ENCOUNTER — Telehealth: Payer: Self-pay

## 2022-11-02 NOTE — Telephone Encounter (Signed)
Post procedure follow up.  Patient states she is doing well.   ?

## 2022-11-03 ENCOUNTER — Other Ambulatory Visit: Payer: Self-pay | Admitting: Internal Medicine

## 2022-11-12 ENCOUNTER — Encounter: Payer: Self-pay | Admitting: *Deleted

## 2022-11-15 DIAGNOSIS — Z23 Encounter for immunization: Secondary | ICD-10-CM | POA: Diagnosis not present

## 2022-11-24 DIAGNOSIS — D3610 Benign neoplasm of peripheral nerves and autonomic nervous system, unspecified: Secondary | ICD-10-CM | POA: Diagnosis not present

## 2022-11-24 DIAGNOSIS — M79605 Pain in left leg: Secondary | ICD-10-CM | POA: Diagnosis not present

## 2022-11-24 DIAGNOSIS — M79604 Pain in right leg: Secondary | ICD-10-CM | POA: Diagnosis not present

## 2022-11-24 DIAGNOSIS — M21371 Foot drop, right foot: Secondary | ICD-10-CM | POA: Diagnosis not present

## 2022-11-24 DIAGNOSIS — G510 Bell's palsy: Secondary | ICD-10-CM | POA: Diagnosis not present

## 2022-11-24 DIAGNOSIS — R200 Anesthesia of skin: Secondary | ICD-10-CM | POA: Diagnosis not present

## 2022-11-24 DIAGNOSIS — R202 Paresthesia of skin: Secondary | ICD-10-CM | POA: Diagnosis not present

## 2022-11-29 ENCOUNTER — Ambulatory Visit
Payer: Medicare Other | Attending: Student in an Organized Health Care Education/Training Program | Admitting: Student in an Organized Health Care Education/Training Program

## 2022-11-29 ENCOUNTER — Encounter: Payer: Self-pay | Admitting: *Deleted

## 2022-11-29 ENCOUNTER — Encounter: Payer: Self-pay | Admitting: Student in an Organized Health Care Education/Training Program

## 2022-11-29 VITALS — BP 157/77 | HR 85 | Temp 97.6°F | Resp 16 | Ht 62.75 in | Wt 102.0 lb

## 2022-11-29 DIAGNOSIS — G894 Chronic pain syndrome: Secondary | ICD-10-CM | POA: Diagnosis not present

## 2022-11-29 DIAGNOSIS — M47816 Spondylosis without myelopathy or radiculopathy, lumbar region: Secondary | ICD-10-CM | POA: Insufficient documentation

## 2022-11-29 NOTE — Patient Instructions (Signed)
Preparing for your procedure (without sedation) ?Instructions: ?Oral Intake: Do not eat or drink anything for at least 3 hours prior to your procedure. ?Transportation: Unless otherwise stated by your physician, you may drive yourself after the procedure. ?Blood Pressure Medicine: Take your blood pressure medicine with a sip of water the morning of the procedure. ?Insulin: Take only ? of your normal insulin dose. ?Preventing infections: Shower with an antibacterial soap the morning of your procedure. ?Build-up your immune system: Take 1000 mg of Vitamin C with every meal (3 times a day) the day prior to your procedure. ?Pregnancy: If you are pregnant, call and cancel the procedure. ?Sickness: If you have a cold, fever, or any active infections, call and cancel the procedure. ?Arrival: You must be in the facility at least 30 minutes prior to your scheduled procedure. ?Children: Do not bring any children with you. ?Dress appropriately: Bring dark clothing that you would not mind if they get stained. ?Valuables: Do not bring any jewelry or valuables. ?Procedure appointments are reserved for interventional treatments only. ?No Prescription Refills. ?No medication changes will be discussed during procedure appointments. ?No disability issues will be discussed.  ?

## 2022-11-29 NOTE — Progress Notes (Signed)
PROVIDER NOTE: Information contained herein reflects review and annotations entered in association with encounter. Interpretation of such information and data should be left to medically-trained personnel. Information provided to patient can be located elsewhere in the medical record under "Patient Instructions". Document created using STT-dictation technology, any transcriptional errors that may result from process are unintentional.    Patient: Rita Taylor  Service Category: E/M  Provider: Edward Jolly, MD  DOB: [birth year]  DOS: 11/29/2022  Referring Provider: [name], MD  MRN: [id number]  Specialty: Interventional Pain Management  PCP: [name], MD  Type: Established Patient  Setting: Ambulatory outpatient    Location: Office  Delivery: Face-to-face     HPI  [name], a 82 y.o. year old female, is here today because of her Lumbar facet arthropathy [M47.816]. Ms. Riff primary complain today is Back Pain  Pain Assessment: Severity of Chronic pain is reported as a 6 /10. Location: Back Lower/to buttocks bilat and down legs to ankles. Onset: More than a month ago. Quality: Aching, Other (Comment) (left thigh and side and top of right foot "are hypersensitive to touch"). Timing: Constant. Modifying factor(s): topicals. Vitals:  height is 5' 2.75" (1.594 m) and weight is 102 lb (46.3 kg). Her temperature is 97.6 F (36.4 C). Her blood pressure is 157/77 (abnormal) and her pulse is 85. Her respiration is 16 and oxygen saturation is 100%.  BMI: Estimated body mass index is 18.21 kg/m as calculated from the following:   Height as of this encounter: 5' 2.75" (1.594 m).   Weight as of this encounter: 102 lb (46.3 kg). Last encounter: 10/06/2022. Last procedure: 11/01/2022.  Reason for encounter: post-procedure evaluation and assessment.    Post-procedure evaluation   Procedure: Lumbar Facet, Medial Branch Block(s) #1  Laterality: Bilateral  Level: L3, L4, and L5  Medial Branch Level(s). Injecting these levels blocks the L3-4 and L4-5 lumbar facet joints. Imaging: Fluoroscopic guidance         Anesthesia: Local anesthesia (1-2% Lidocaine) Sedation: No Sedation                       DOS: 11/01/2022 Performed by: Edward Jolly, MD  Primary Purpose: Diagnostic/Therapeutic Indications: Low back pain severe enough to impact quality of life or function. 1. Lumbar facet arthropathy   2. Lumbar spondylosis   3. Chronic pain syndrome    NAS-11 Pain score:   Pre-procedure: 10-Worst pain ever/10   Post-procedure: 0-No pain/10      Effectiveness:  Initial hour after procedure: 100 %  Subsequent 4-6 hours post-procedure: 45 % (lasted 2 weeks)  Analgesia past initial 6 hours: 45% for 2 weeks and then patient fell and exacerbated her low back pain Ongoing improvement:  Analgesic:  ?  Patient experiencing acute pain from her recent fall last week.  She states that prior to that she was noticing improvement in her low back pain and functional status   ROS  Constitutional: Denies any fever or chills Gastrointestinal: No reported hemesis, hematochezia, vomiting, or acute GI distress Musculoskeletal:  Low back pain Neurological: No reported episodes of acute onset apraxia, aphasia, dysarthria, agnosia, amnesia, paralysis, loss of coordination, or loss of consciousness  Medication Review  Aspirin, Biotin w/ Vitamins C & E, Calcium 1200, Vitamin D-3, buPROPion, celecoxib, cyanocobalamin, denosumab, erythromycin, gabapentin, nortriptyline, pravastatin, and telmisartan-hydrochlorothiazide  History Review  Allergy: Ms. Alvarenga is allergic to dextromethorphan-guaifenesin, fosamax [alendronate], pseudoephedrine-dm-gg, and risedronate sodium. Drug: Ms. Balderston  reports no history  of drug use. Alcohol:  reports current alcohol use of about 1.0 standard drink of alcohol per week. Tobacco:  reports that she has never smoked. She has never used smokeless tobacco. Social:  Ms. Yandyke  reports that she has never smoked. She has never used smokeless tobacco. She reports current alcohol use of about 1.0 standard drink of alcohol per week. She reports that she does not use drugs. Medical:  has a past medical history of History of shingles, Hyperlipidemia, Hypertension, Melanoma (HCC) (2008), Neurofibromatosis type II (HCC), Postoperative anemia (07/22/2018), and 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(HCC). Surgical: Ms. Beaudoin  has a past surgical history that includes Gynecologic cryosurgery; Breast enhancement surgery (1990); bitubal ligation (1981); Colonoscopy; Facial cosmetic surgery; Tumor excision (Left, 06/2018); Ectropion repair (Left, 03/02/2019); Brow lift (Left, 01/24/2020); and Colon surgery. Family: family history includes High Cholesterol in her father and mother; Hypertension in her brother, father, and mother.  Laboratory Chemistry Profile   Renal Lab Results  Component Value Date   BUN 29 (H) 09/27/2022   CREATININE 0.92 09/27/2022   GFR 57.84 (L) 09/27/2022   GFRAA 79 07/01/2014   GFRNONAA >60 08/26/2022    Hepatic Lab Results  Component Value Date   AST 12 09/27/2022   ALT 11 09/27/2022   ALBUMIN 4.3 09/27/2022   ALKPHOS 38 (L) 09/27/2022   HCVAB NEGATIVE 01/06/2015    Electrolytes Lab Results  Component Value Date   NA 136 09/27/2022   K 3.9 09/27/2022   CL 100 09/27/2022   CALCIUM 9.3 09/27/2022    Bone Lab Results  Component Value Date   VD25OH 45.05 03/10/2020    Inflammation (CRP: Acute Phase) (ESR: Chronic Phase) No results found for: "CRP", "ESRSEDRATE", "LATICACIDVEN"       Note: Above Lab results reviewed.   Physical Exam  General appearance: Well nourished, well developed, and well hydrated. In no apparent acute distress Mental status: Alert, oriented x 3 (person, place, & time)       Respiratory: No evidence of acute respiratory distress Eyes: PERLA Vitals: BP (!) 157/77   Pulse 85   Temp 97.6 F (36.4 C)   Resp 16   Ht 5'  2.75" (1.594 m)   Wt 102 lb (46.3 kg)   SpO2 100%   BMI 18.21 kg/m  BMI: Estimated body mass index is 18.21 kg/m as calculated from the following:   Height as of this encounter: 5' 2.75" (1.594 m).   Weight as of this encounter: 102 lb (46.3 kg). Ideal: Ideal body weight: 51.8 kg (114 lb 4.1 oz)  Lumbar Spine Area Exam  Skin & Axial Inspection: No masses, redness, or swelling Alignment: Symmetrical Functional ROM: Pain restricted ROM affecting both sides Stability: No instability detected Muscle Tone/Strength: Functionally intact. No obvious neuro-muscular anomalies detected. Sensory (Neurological): Musculoskeletal pain pattern Palpation: No palpable anomalies       Provocative Tests: Hyperextension/rotation test: (+) bilaterally for facet joint pain. Lumbar quadrant test (Kemp's test): (+) bilaterally for facet joint pain.   Gait & Posture Assessment  Ambulation: Patient came in today in a wheel chair Gait: Antalgic gait (limping) Posture: Difficulty standing up straight, due to pain  Lower Extremity Exam      Side: Right lower extremity   Side: Left lower extremity  Stability: No instability observed           Stability: No instability observed          Skin & Extremity Inspection: Skin color, temperature, and hair growth are WNL. No peripheral  edema or cyanosis. No masses, redness, swelling, asymmetry, or associated skin lesions. No contractures.   Skin & Extremity Inspection: Skin color, temperature, and hair growth are WNL. No peripheral edema or cyanosis. No masses, redness, swelling, asymmetry, or associated skin lesions. No contractures.  Functional ROM: Unrestricted ROM                   Functional ROM: Unrestricted ROM                  Muscle Tone/Strength: Functionally intact. No obvious neuro-muscular anomalies detected.   Muscle Tone/Strength: Functionally intact. No obvious neuro-muscular anomalies detected.  Sensory (Neurological): Unimpaired         Sensory  (Neurological): Unimpaired        DTR: Patellar: deferred today Achilles: deferred today Plantar: deferred today   DTR: Patellar: deferred today Achilles: deferred today Plantar: deferred today  Palpation: No palpable anomalies   Palpation: No palpable anomalies     Assessment   Diagnosis  1. Lumbar facet arthropathy   2. Lumbar spondylosis   3. Chronic pain syndrome       Plan of Care  I encouraged patient to monitor her symptoms as she is experiencing acute on chronic pain from her recent fall.  She states that she was obtaining relief after her diagnostic lumbar facet injections but that assessment is more difficult to make in the context of her acute pain.  We discussed repeating diagnostic lumbar facet medial branch nerve blocks #2 and then considering lumbar RFA.  Patient states that she will think about this further and let us know  Orders:  Orders Placed This Encounter  Procedures   LUMBAR FACET(MEDIAL BRANCH NERVE BLOCK) MBNB    Standing Status:   Standing    Number of Occurrences:   2    Standing Expiration Date:   05/30/2023    Scheduling Instructions:     Procedure: Lumbar facet block (AKA.: Lumbosacral medial branch nerve block)     Side: Bilateral     Level: , L3-4, L4-5, Facets (L3, L4, L5, Medial Branch)     Sedation: Patient's choice.     Timeframe: PRN    Order Specific Question:   Where will this procedure be performed?    Answer:   [facility]   Follow-up plan:   Return for PRN: lumbar facet #2.      Left L4/5 ESI; bilateral SI joint and piriformis 09/13/2022. BLF L3,4,5 11/01/22     Recent Visits Date Type Provider Dept  11/01/22 Procedure visit Edward Jolly, MD [facility]  10/06/22 Office Visit Edward Jolly, MD [facility]  09/13/22 Procedure visit Edward Jolly, MD [facility]  Showing recent visits within past 90 days and meeting all other requirements Today's Visits Date Type Provider Dept  11/29/22  Office Visit Edward Jolly, MD [facility]  Showing today's visits and meeting all other requirements Future Appointments No visits were found meeting these conditions. Showing future appointments within next 90 days and meeting all other requirements  I discussed the assessment and treatment plan with the patient. The patient was provided an opportunity to ask questions and all were answered. The patient agreed with the plan and demonstrated an understanding of the instructions.  Patient advised to call back or seek an in-person evaluation if the symptoms or condition worsens.  Duration of encounter: .  Total time on encounter, as per AMA guidelines included both the face-to-face and non-face-to-face time personally  spent by the physician and/or other qualified health care professional(s) on the day of the encounter (includes time in activities that require the physician or other qualified health care professional and does not include time in activities normally performed by clinical staff). Physician's time may include the following activities when performed: Preparing to see the patient (e.g., pre-charting review of records, searching for previously ordered imaging, lab work, and nerve conduction tests) Review of prior analgesic pharmacotherapies. Reviewing PMP Interpreting ordered tests (e.g., lab work, imaging, nerve conduction tests) Performing post-procedure evaluations, including interpretation of diagnostic procedures Obtaining and/or reviewing separately obtained history Performing a medically appropriate examination and/or evaluation Counseling and educating the patient/family/caregiver Ordering medications, tests, or procedures Referring and communicating with other health care professionals (when not separately reported) Documenting clinical information in the electronic or other health record Independently interpreting results (not separately reported) and  communicating results to the patient/ family/caregiver Care coordination (not separately reported)  Note by: Edward Jolly, MD Date: 11/29/2022; Time: 3:30 PM

## 2022-11-29 NOTE — Progress Notes (Signed)
Safety precautions to be maintained throughout the outpatient stay will include: orient to surroundings, keep bed in low position, maintain call bell within reach at all times, provide assistance with transfer out of bed and ambulation.  

## 2022-12-02 NOTE — Telephone Encounter (Signed)
Pt scheduled for 12/03/22 @ 4pm for Prolia.   $0 due for Prolia; PA required

## 2022-12-03 ENCOUNTER — Ambulatory Visit (INDEPENDENT_AMBULATORY_CARE_PROVIDER_SITE_OTHER): Payer: Medicare Other

## 2022-12-03 DIAGNOSIS — M810 Age-related osteoporosis without current pathological fracture: Secondary | ICD-10-CM

## 2022-12-03 MED ORDER — DENOSUMAB 60 MG/ML ~~LOC~~ SOSY
60.0000 mg | PREFILLED_SYRINGE | Freq: Once | SUBCUTANEOUS | Status: AC
Start: 2022-12-03 — End: 2022-12-03
  Administered 2022-12-03: 60 mg via SUBCUTANEOUS

## 2022-12-03 NOTE — Progress Notes (Signed)
Patient presented for Prolia to left arm, patient voiced no concerns nor showed any signs of distress

## 2022-12-13 ENCOUNTER — Other Ambulatory Visit: Payer: Self-pay | Admitting: Internal Medicine

## 2022-12-22 DIAGNOSIS — H168 Other keratitis: Secondary | ICD-10-CM | POA: Diagnosis not present

## 2022-12-25 ENCOUNTER — Other Ambulatory Visit: Payer: Self-pay | Admitting: Internal Medicine

## 2022-12-27 NOTE — Telephone Encounter (Signed)
Medication was discontinued on 10/06/2022 by Dr. Cherylann Ratel. Is it okay to refuse request?

## 2023-01-07 ENCOUNTER — Other Ambulatory Visit: Payer: Self-pay | Admitting: Student in an Organized Health Care Education/Training Program

## 2023-01-07 DIAGNOSIS — G894 Chronic pain syndrome: Secondary | ICD-10-CM

## 2023-01-10 ENCOUNTER — Other Ambulatory Visit: Payer: Self-pay | Admitting: *Deleted

## 2023-01-10 ENCOUNTER — Telehealth: Payer: Self-pay | Admitting: Student in an Organized Health Care Education/Training Program

## 2023-01-10 DIAGNOSIS — G894 Chronic pain syndrome: Secondary | ICD-10-CM

## 2023-01-10 MED ORDER — GABAPENTIN 600 MG PO TABS: 600.0000 mg | ORAL_TABLET | Freq: Four times a day (QID) | ORAL | 2 refills | Status: DC

## 2023-01-10 NOTE — Telephone Encounter (Signed)
Rx request sent to Dr. Cherylann Ratel

## 2023-01-10 NOTE — Telephone Encounter (Signed)
PT called stated that she need a refill on gabapentin.

## 2023-01-11 DIAGNOSIS — D333 Benign neoplasm of cranial nerves: Secondary | ICD-10-CM | POA: Diagnosis not present

## 2023-01-11 DIAGNOSIS — E236 Other disorders of pituitary gland: Secondary | ICD-10-CM | POA: Diagnosis not present

## 2023-01-11 NOTE — Telephone Encounter (Signed)
Patient notified

## 2023-01-12 DIAGNOSIS — D333 Benign neoplasm of cranial nerves: Secondary | ICD-10-CM | POA: Diagnosis not present

## 2023-01-19 DIAGNOSIS — D225 Melanocytic nevi of trunk: Secondary | ICD-10-CM | POA: Diagnosis not present

## 2023-01-19 DIAGNOSIS — Z85820 Personal history of malignant melanoma of skin: Secondary | ICD-10-CM | POA: Diagnosis not present

## 2023-01-19 DIAGNOSIS — Z85828 Personal history of other malignant neoplasm of skin: Secondary | ICD-10-CM | POA: Diagnosis not present

## 2023-01-19 DIAGNOSIS — Z08 Encounter for follow-up examination after completed treatment for malignant neoplasm: Secondary | ICD-10-CM | POA: Diagnosis not present

## 2023-01-19 DIAGNOSIS — L821 Other seborrheic keratosis: Secondary | ICD-10-CM | POA: Diagnosis not present

## 2023-01-24 DIAGNOSIS — Z23 Encounter for immunization: Secondary | ICD-10-CM | POA: Diagnosis not present

## 2023-02-18 ENCOUNTER — Telehealth: Payer: Self-pay

## 2023-02-18 NOTE — Telephone Encounter (Signed)
 Copied from CRM [id number]. Topic: Clinical - Prescription Issue >> Feb 18, 2023  3:42 PM Laurier C wrote: Reason for CRM: Patient went to pick up her medication: telmisartan -hydrochlorothiazide (MICARDIS  HCT) 80-25 MG tablet and it cost $45. Patient is wanting to know if there is a generic brand of that medication or something cheaper she could switch too.

## 2023-02-21 NOTE — Telephone Encounter (Signed)
 Pt stated that she was not at home but that she would give [place] a call back once at home.

## 2023-02-22 MED ORDER — LOSARTAN POTASSIUM-HCTZ 100-12.5 MG PO TABS: 1.0000 | ORAL_TABLET | Freq: Every day | ORAL | 0 refills | Status: DC

## 2023-02-22 NOTE — Telephone Encounter (Signed)
 Pt called back and stated that the $45 is for a 30 day supply.

## 2023-02-22 NOTE — Addendum Note (Signed)
 Addended by: [name] on: 02/22/2023 12:50 PM   Modules accepted: Orders

## 2023-02-23 NOTE — Telephone Encounter (Signed)
 Spoke with pt yesterday evening and she stated that she is going to finish the Telmisartan that she has already paid for before she switches to the losartan hydrochlorothiazide.

## 2023-03-03 ENCOUNTER — Other Ambulatory Visit: Payer: Medicare Other

## 2023-03-03 ENCOUNTER — Inpatient Hospital Stay: Payer: Medicare Other | Attending: Oncology

## 2023-03-03 DIAGNOSIS — D649 Anemia, unspecified: Secondary | ICD-10-CM | POA: Insufficient documentation

## 2023-03-03 DIAGNOSIS — G8929 Other chronic pain: Secondary | ICD-10-CM | POA: Insufficient documentation

## 2023-03-03 DIAGNOSIS — F1090 Alcohol use, unspecified, uncomplicated: Secondary | ICD-10-CM | POA: Insufficient documentation

## 2023-03-03 DIAGNOSIS — M549 Dorsalgia, unspecified: Secondary | ICD-10-CM | POA: Insufficient documentation

## 2023-03-07 ENCOUNTER — Inpatient Hospital Stay: Payer: Medicare Other

## 2023-03-07 ENCOUNTER — Encounter: Payer: Self-pay | Admitting: *Deleted

## 2023-03-07 ENCOUNTER — Telehealth: Payer: Self-pay

## 2023-03-07 DIAGNOSIS — D649 Anemia, unspecified: Secondary | ICD-10-CM | POA: Diagnosis not present

## 2023-03-07 DIAGNOSIS — F1090 Alcohol use, unspecified, uncomplicated: Secondary | ICD-10-CM | POA: Diagnosis not present

## 2023-03-07 DIAGNOSIS — M549 Dorsalgia, unspecified: Secondary | ICD-10-CM | POA: Diagnosis not present

## 2023-03-07 DIAGNOSIS — G8929 Other chronic pain: Secondary | ICD-10-CM | POA: Diagnosis not present

## 2023-03-07 LAB — CBC WITH DIFFERENTIAL (CANCER CENTER ONLY)
Abs Immature Granulocytes: 0.02 10*3/uL (ref 0.00–0.07)
Basophils Absolute: 0.1 10*3/uL (ref 0.0–0.1)
Basophils Relative: 1 %
Eosinophils Absolute: 0.1 10*3/uL (ref 0.0–0.5)
Eosinophils Relative: 2 %
HCT: 31.6 % — ABNORMAL LOW (ref 36.0–46.0)
Hemoglobin: 10.1 g/dL — ABNORMAL LOW (ref 12.0–15.0)
Immature Granulocytes: 0 %
Lymphocytes Relative: 35 %
Lymphs Abs: 2.4 10*3/uL (ref 0.7–4.0)
MCH: 30 pg (ref 26.0–34.0)
MCHC: 32 g/dL (ref 30.0–36.0)
MCV: 93.8 fL (ref 80.0–100.0)
Monocytes Absolute: 0.4 10*3/uL (ref 0.1–1.0)
Monocytes Relative: 6 %
Neutro Abs: 3.8 10*3/uL (ref 1.7–7.7)
Neutrophils Relative %: 56 %
Platelet Count: 213 10*3/uL (ref 150–400)
RBC: 3.37 MIL/uL — ABNORMAL LOW (ref 3.87–5.11)
RDW: 13.4 % (ref 11.5–15.5)
WBC Count: 6.9 10*3/uL (ref 4.0–10.5)
nRBC: 0 % (ref 0.0–0.2)

## 2023-03-07 LAB — CMP (CANCER CENTER ONLY)
ALT: 22 U/L (ref 0–44)
AST: 23 U/L (ref 15–41)
Albumin: 3.9 g/dL (ref 3.5–5.0)
Alkaline Phosphatase: 45 U/L (ref 38–126)
Anion gap: 8 (ref 5–15)
BUN: 35 mg/dL — ABNORMAL HIGH (ref 8–23)
CO2: 28 mmol/L (ref 22–32)
Calcium: 8.6 mg/dL — ABNORMAL LOW (ref 8.9–10.3)
Chloride: 102 mmol/L (ref 98–111)
Creatinine: 0.92 mg/dL (ref 0.44–1.00)
GFR, Estimated: >60 mL/min (ref >60)
Glucose, Bld: 102 mg/dL — ABNORMAL HIGH (ref 70–99)
Potassium: 3.7 mmol/L (ref 3.5–5.1)
Sodium: 138 mmol/L (ref 135–145)
Total Bilirubin: 0.5 mg/dL (ref 0.0–1.2)
Total Protein: 6.6 g/dL (ref 6.5–8.1)

## 2023-03-07 LAB — FOLATE: Folate: 12 ng/mL (ref >5.9)

## 2023-03-07 LAB — VITAMIN B12: Vitamin B-12: 1390 pg/mL — ABNORMAL HIGH (ref 180–914)

## 2023-03-07 NOTE — Telephone Encounter (Signed)
Copied from CRM [id number]. Topic: Clinical - Medical Advice >> Mar 07, 2023  2:14 PM Alona Bene A wrote: Reason for CRM: Patient called in to see if the prolia shot could be added to her lab visit on 03/14/23. Please contact patient via MyChart or phone to discuss.

## 2023-03-08 ENCOUNTER — Inpatient Hospital Stay (HOSPITAL_BASED_OUTPATIENT_CLINIC_OR_DEPARTMENT_OTHER): Payer: Medicare Other | Admitting: Oncology

## 2023-03-08 ENCOUNTER — Encounter: Payer: Self-pay | Admitting: Oncology

## 2023-03-08 VITALS — BP 157/70 | HR 82 | Temp 97.1°F | Resp 18 | Wt 113.6 lb

## 2023-03-08 DIAGNOSIS — D649 Anemia, unspecified: Secondary | ICD-10-CM

## 2023-03-08 DIAGNOSIS — M549 Dorsalgia, unspecified: Secondary | ICD-10-CM | POA: Diagnosis not present

## 2023-03-08 DIAGNOSIS — Z789 Other specified health status: Secondary | ICD-10-CM | POA: Diagnosis not present

## 2023-03-08 DIAGNOSIS — F1090 Alcohol use, unspecified, uncomplicated: Secondary | ICD-10-CM | POA: Diagnosis not present

## 2023-03-08 DIAGNOSIS — G8929 Other chronic pain: Secondary | ICD-10-CM | POA: Diagnosis not present

## 2023-03-08 NOTE — Assessment & Plan Note (Addendum)
Previous work up showed negative M protein on multiple myeloma panel, normal iron TIBC ferritin,normal LDH Light chain ratio slightly elevated, non specific, negative 24 hour UPEP Hb ihas decreased to 10.1 despite cessation of alcohol  We discussed option of bone marrow biopsy and she prefers to be observed.  Shared decision was made to repeat lab in 3 months to close monitor

## 2023-03-08 NOTE — Assessment & Plan Note (Addendum)
Encourage alcohol cessation, encourage her cessation effort  B12 level is adequate
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
## 2023-03-14 ENCOUNTER — Other Ambulatory Visit (INDEPENDENT_AMBULATORY_CARE_PROVIDER_SITE_OTHER): Payer: Medicare Other

## 2023-03-14 DIAGNOSIS — R944 Abnormal results of kidney function studies: Secondary | ICD-10-CM | POA: Diagnosis not present

## 2023-03-14 LAB — BASIC METABOLIC PANEL
BUN: 31 mg/dL — ABNORMAL HIGH (ref 6–23)
CO2: 31 meq/L (ref 19–32)
Calcium: 9.2 mg/dL (ref 8.4–10.5)
Chloride: 101 meq/L (ref 96–112)
Creatinine, Ser: 0.93 mg/dL (ref 0.40–1.20)
GFR: 56.91 mL/min — ABNORMAL LOW (ref >60.00)
Glucose, Bld: 111 mg/dL — ABNORMAL HIGH (ref 70–99)
Potassium: 4.1 meq/L (ref 3.5–5.1)
Sodium: 140 meq/L (ref 135–145)

## 2023-03-16 ENCOUNTER — Encounter: Payer: Self-pay | Admitting: Internal Medicine

## 2023-03-16 DIAGNOSIS — R944 Abnormal results of kidney function studies: Secondary | ICD-10-CM | POA: Insufficient documentation

## 2023-03-16 NOTE — Addendum Note (Signed)
Addended by: [name] on: 03/16/2023 12:54 PM   Modules accepted: Orders

## 2023-03-16 NOTE — Assessment & Plan Note (Signed)
GFR has been < 60 since July 2024,  advised to suspend celebrex and repeat BMET in one week  Lab Results  Component Value Date   CREATININE 0.93 03/14/2023   Lab Results  Component Value Date   NA 140 03/14/2023   K 4.1 03/14/2023   CL 101 03/14/2023   CO2 31 03/14/2023

## 2023-03-21 ENCOUNTER — Encounter: Payer: Self-pay | Admitting: Internal Medicine

## 2023-03-21 ENCOUNTER — Ambulatory Visit (INDEPENDENT_AMBULATORY_CARE_PROVIDER_SITE_OTHER): Payer: Medicare Other | Admitting: Internal Medicine

## 2023-03-21 VITALS — BP 158/74 | HR 83 | Ht 62.75 in | Wt 110.2 lb

## 2023-03-21 DIAGNOSIS — E7800 Pure hypercholesterolemia, unspecified: Secondary | ICD-10-CM

## 2023-03-21 DIAGNOSIS — I2510 Atherosclerotic heart disease of native coronary artery without angina pectoris: Secondary | ICD-10-CM

## 2023-03-21 DIAGNOSIS — E119 Type 2 diabetes mellitus without complications: Secondary | ICD-10-CM | POA: Diagnosis not present

## 2023-03-21 DIAGNOSIS — R944 Abnormal results of kidney function studies: Secondary | ICD-10-CM

## 2023-03-21 DIAGNOSIS — I10 Essential (primary) hypertension: Secondary | ICD-10-CM | POA: Diagnosis not present

## 2023-03-21 DIAGNOSIS — I700 Atherosclerosis of aorta: Secondary | ICD-10-CM

## 2023-03-21 DIAGNOSIS — Z789 Other specified health status: Secondary | ICD-10-CM | POA: Diagnosis not present

## 2023-03-21 DIAGNOSIS — E0849 Diabetes mellitus due to underlying condition with other diabetic neurological complication: Secondary | ICD-10-CM

## 2023-03-21 DIAGNOSIS — D333 Benign neoplasm of cranial nerves: Secondary | ICD-10-CM

## 2023-03-21 DIAGNOSIS — F1090 Alcohol use, unspecified, uncomplicated: Secondary | ICD-10-CM

## 2023-03-21 MED ORDER — PRAVASTATIN SODIUM 40 MG PO TABS: ORAL_TABLET | ORAL | 1 refills | Status: DC

## 2023-03-21 MED ORDER — LOSARTAN POTASSIUM-HCTZ 100-12.5 MG PO TABS: 1.0000 | ORAL_TABLET | Freq: Every day | ORAL | 1 refills | Status: DC

## 2023-03-21 MED ORDER — AMLODIPINE BESYLATE 5 MG PO TABS: 5.0000 mg | ORAL_TABLET | Freq: Every day | ORAL | 1 refills | Status: DC

## 2023-03-21 MED ORDER — TRAMADOL HCL 50 MG PO TABS: 50.0000 mg | ORAL_TABLET | Freq: Four times a day (QID) | ORAL | 0 refills | Status: AC | PRN

## 2023-03-21 NOTE — Progress Notes (Unsigned)
Subjective:  Patient ID: Rita Taylor, female    DOB: March 18, 1940  Age: 83 y.o. MRN: [id number]  CC: The primary encounter diagnosis was Primary hypertension. Diagnoses of DM type 2, goal HbA1c < 7% (HCC), Pure hypercholesterolemia, Diabetes due to undrl condition w oth diabetic neuro comp (HCC), 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(HCC), Alcohol use, Abdominal aortic atherosclerosis (HCC), Atherosclerosis of native coronary artery of native heart without angina pectoris, White coat syndrome with hypertension, and Decreased GFR were also pertinent to this visit.   HPI Rita Taylor presents for  Chief Complaint  Patient presents with   Medical Management of Chronic Issues   1) facial paralysis/droop on left side of face. Since her last visit she has had several more  procedures,  with no improvement in the facial droop  2) history of fall:  she was knocked over by her daughter's dog,3 weeks ago,    landed on her tailbone , still sore.   3) Dec GFR:  she was advised to stop using  celebrex 5 days   ago,  has been using tylenol for pain managament  4) HTN:  Hypertension: patient checks blood pressure twice weekly at home.  Readings have been for the most part >130/80 at rest . Patient is following a reduced salt diet most days and is taking medications as prescribed    5) normocytic anemia:  seeing oncology ; workup thus far non revealing except for slightly elevated light chain ratio.  Anemia is worsening; BM biopsy recommended but deferred by patient.    Outpatient Medications Prior to Visit  Medication Sig Dispense Refill   ASPIRIN 81 PO Take by mouth daily.     Biotin w/ Vitamins C & E (HAIR/SKIN/NAILS PO) Take by mouth daily.     buPROPion (WELLBUTRIN XL) 150 MG 24 hr tablet Take 1 tablet (150 mg total) by mouth daily. 90 tablet 1   Calcium Carbonate-Vit D-Min (CALCIUM 1200) 1200-1000 MG-UNIT CHEW Chew 1 tablet by mouth daily.     Cholecalciferol (VITAMIN D-3) 25 MCG (1000 UT) CAPS Take by  mouth.     cyanocobalamin (VITAMIN B12) 1000 MCG tablet Take 1 tablet (1,000 mcg total) by mouth daily. 90 tablet 1   denosumab (PROLIA) 60 MG/ML SOSY injection Inject into the skin.     erythromycin ophthalmic ointment APPLY SMALL AMOUNT IN LEFT EYE TWICE DAILY AS DIRECTED     gabapentin (NEURONTIN) 600 MG tablet Take 1 tablet (600 mg total) by mouth in the morning, at noon, in the evening, and at bedtime. 120 tablet 2   nortriptyline (PAMELOR) 10 MG capsule Take 10 mg by mouth at bedtime.     losartan-hydrochlorothiazide (HYZAAR) 100-12.5 MG tablet Take 1 tablet by mouth daily. 90 tablet 0   pravastatin (PRAVACHOL) 40 MG tablet TAKE 1 TABLET(40 MG) BY MOUTH DAILY 90 tablet 3   celecoxib (CELEBREX) 100 MG capsule TAKE 1 CAPSULE(100 MG) BY MOUTH TWICE DAILY (Patient not taking: Reported on 03/21/2023) 180 capsule 0   No facility-administered medications prior to visit.    Review of Systems;  Patient denies headache, fevers, malaise, unintentional weight loss, skin rash, eye pain, sinus congestion and sinus pain, sore throat, dysphagia,  hemoptysis , cough, dyspnea, wheezing, chest pain, palpitations, orthopnea, edema, abdominal pain, nausea, melena, diarrhea, constipation, flank pain, dysuria, hematuria, urinary  Frequency, nocturia, numbness, tingling, seizures,  Focal weakness, Loss of consciousness,  Tremor, insomnia, depression, anxiety, and suicidal ideation.      Objective:  BP (!) 158/74  Pulse 83   Ht 5' 2.75" (1.594 m)   Wt 110 lb 3.2 oz (50 kg)   SpO2 100%   BMI 19.68 kg/m   BP Readings from Last 3 Encounters:  03/21/23 (!) 158/74  03/08/23 (!) 157/70  11/29/22 (!) 157/77    Wt Readings from Last 3 Encounters:  03/21/23 110 lb 3.2 oz (50 kg)  03/08/23 113 lb 9.6 oz (51.5 kg)  11/29/22 102 lb (46.3 kg)    Physical Exam Vitals reviewed.  Constitutional:      General: She is not in acute distress.    Appearance: Normal appearance. She is normal weight. She is not  ill-appearing, toxic-appearing or diaphoretic.  HENT:     Head: Normocephalic.  Eyes:     General: No scleral icterus.       Right eye: No discharge.        Left eye: No discharge.     Conjunctiva/sclera: Conjunctivae normal.  Cardiovascular:     Rate and Rhythm: Normal rate and regular rhythm.     Heart sounds: Normal heart sounds.  Pulmonary:     Effort: Pulmonary effort is normal. No respiratory distress.     Breath sounds: Normal breath sounds.  Musculoskeletal:        General: Normal range of motion.  Skin:    General: Skin is warm and dry.  Neurological:     Mental Status: She is alert. Mental status is at baseline. She is disoriented.     Cranial Nerves: Facial asymmetry present. No dysarthria.     Motor: Motor function is intact.     Coordination: Coordination is intact.     Gait: Gait is intact.     Comments: Left sided facial droop  Psychiatric:        Mood and Affect: Mood normal.        Behavior: Behavior normal.        Thought Content: Thought content normal.        Judgment: Judgment normal.   Lab Results  Component Value Date   HGBA1C 6.0 09/27/2022   HGBA1C 6.2 03/17/2022   HGBA1C 6.6 (H) 08/25/2021    Lab Results  Component Value Date   CREATININE 0.93 03/14/2023   CREATININE 0.92 03/07/2023   CREATININE 0.92 09/27/2022    Lab Results  Component Value Date   WBC 6.9 03/07/2023   HGB 10.1 (L) 03/07/2023   HCT 31.6 (L) 03/07/2023   PLT 213 03/07/2023   GLUCOSE 111 (H) 03/14/2023   CHOL 181 09/27/2022   TRIG 121.0 09/27/2022   HDL 62.10 09/27/2022   LDLDIRECT 103.0 09/27/2022   LDLCALC 94 09/27/2022   ALT 22 03/07/2023   AST 23 03/07/2023   NA 140 03/14/2023   K 4.1 03/14/2023   CL 101 03/14/2023   CREATININE 0.93 03/14/2023   BUN 31 (H) 03/14/2023   CO2 31 03/14/2023   TSH 0.50 09/27/2022   HGBA1C 6.0 09/27/2022   MICROALBUR 2.3 (H) 03/17/2022    DG PAIN CLINIC C-ARM 1-60 MIN NO REPORT Result Date: 11/01/2022 Fluoro was used, but  no Radiologist interpretation will be provided. Please refer to "NOTES" tab for provider progress note.   Assessment & Plan:  .Primary hypertension Assessment & Plan: Recent Home readings reviewed and are elevated.  Adding amlodipine 5 mg daily    Orders: -     Comprehensive metabolic panel; Future  DM type 2, goal HbA1c < 7% (HCC) -     Hemoglobin A1c; Future -  Comprehensive metabolic panel; Future -     Microalbumin / creatinine urine ratio; Future  Pure hypercholesterolemia -     Lipid panel; Future -     LDL cholesterol, direct; Future -     Microalbumin / creatinine urine ratio; Future  Diabetes due to undrl condition w oth diabetic neuro comp (HCC)  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(HCC)  Alcohol use Assessment & Plan: She has reduced her daily glass of wine to less than one glass per week on advice of oncology   Abdominal aortic atherosclerosis (HCC) Assessment & Plan: Reviewed findings of prior CT scan today..  Patient has increased statin therapy to 40 mg pravastatin /  Lab Results  Component Value Date   CHOL 181 09/27/2022   HDL 62.10 09/27/2022   LDLCALC 94 09/27/2022   LDLDIRECT 103.0 09/27/2022   TRIG 121.0 09/27/2022   CHOLHDL 3 09/27/2022     Atherosclerosis of native coronary artery of native heart without angina pectoris Assessment & Plan: Noted on Jan 2021 CT .  Continues to be Asymptomatic.  Prefers to continue low potency statin  .Goal is LDL 70 or less   Lab Results  Component Value Date   CHOL 181 09/27/2022   HDL 62.10 09/27/2022   LDLCALC 94 09/27/2022   LDLDIRECT 103.0 09/27/2022   TRIG 121.0 09/27/2022   CHOLHDL 3 09/27/2022      White coat syndrome with hypertension Assessment & Plan: Recent Home readings reviewed and are elevated.  Adding amlodipine 5 mg daily     Decreased GFR Assessment & Plan: New onset,  in the setting of chronic NSAID use and worsening normocytic anemia. Will repeat  GFR after 2 week suspension of NSAID.      Other orders -     Losartan Potassium-HCTZ; Take 1 tablet by mouth daily.  Dispense: 90 tablet; Refill: 1 -     Pravastatin Sodium; TAKE 1 TABLET(40 MG) BY MOUTH DAILY  Dispense: 90 tablet; Refill: 1 -     amLODIPine Besylate; Take 1 tablet (5 mg total) by mouth daily.  Dispense: 90 tablet; Refill: 1 -     traMADol HCl; Take 1 tablet (50 mg total) by mouth every 6 (six) hours as needed for up to 7 days.  Dispense: 28 tablet; Refill: 0     I provided 30 minutes of face-to-face time during this encounter reviewing patient's last visit with me, patient's  most recent visit with oncology, plastic surgery,  recent surgical and non surgical procedures, previous  labs and imaging studies, counseling on currently addressed issues,  and post visit ordering to diagnostics and therapeutics .   Follow-up: Return in about 2 weeks (around 04/04/2023).   [name], MD

## 2023-03-21 NOTE — Patient Instructions (Addendum)
Continue suspending the celebrex  You can take 4 tylenol daily (maximum daily dose 2000 mg)  Add tramadol if needed for pain management   Adding amlodipine 5 mg daily for high blood pressure  IF YOU DEVELOP SWELLING,  I WILL CALL IN A DIURETIC TO USE 1-2 TIMES PER WEEK    Rn visit and lab visit 2 weeks   Dr Liliane Channel is concerned that you have transitioned to multiple myeloma.  That's why a bone marrow biopsy is recommended

## 2023-03-22 NOTE — Assessment & Plan Note (Addendum)
Recent Home readings reviewed and are elevated.  Adding amlodipine 5 mg daily

## 2023-03-22 NOTE — Assessment & Plan Note (Signed)
 Reviewed findings of prior CT scan today..  Patient has increased statin therapy to 40 mg pravastatin  /  Lab Results  Component Value Date   CHOL 181 09/27/2022   HDL 62.10 09/27/2022   LDLCALC 94 09/27/2022   LDLDIRECT 103.0 09/27/2022   TRIG 121.0 09/27/2022   CHOLHDL 3 09/27/2022

## 2023-03-22 NOTE — Assessment & Plan Note (Signed)
She has reduced her daily glass of wine to less than one glass per week on advice of oncology

## 2023-03-22 NOTE — Assessment & Plan Note (Signed)
 Noted on Jan 2021 CT .  Continues to be Asymptomatic.  Prefers to continue low potency statin  .Goal is LDL 70 or less   Lab Results  Component Value Date   CHOL 181 09/27/2022   HDL 62.10 09/27/2022   LDLCALC 94 09/27/2022   LDLDIRECT 103.0 09/27/2022   TRIG 121.0 09/27/2022   CHOLHDL 3 09/27/2022

## 2023-03-22 NOTE — Assessment & Plan Note (Addendum)
New onset,  in the setting of chronic NSAID use and worsening normocytic anemia. Will repeat  GFR after 2 week suspension of NSAID.

## 2023-03-28 ENCOUNTER — Encounter: Payer: Self-pay | Admitting: Internal Medicine

## 2023-03-30 ENCOUNTER — Emergency Department: Payer: No Typology Code available for payment source

## 2023-03-30 ENCOUNTER — Encounter (HOSPITAL_COMMUNITY): Payer: Self-pay

## 2023-03-30 ENCOUNTER — Inpatient Hospital Stay
Admission: EM | Admit: 2023-03-30 | Discharge: 2023-04-08 | DRG: 429 | Disposition: A | Discharge status: Rehabilitation Facility | Payer: No Typology Code available for payment source | Attending: Internal Medicine | Admitting: Internal Medicine

## 2023-03-30 ENCOUNTER — Other Ambulatory Visit: Payer: Self-pay

## 2023-03-30 ENCOUNTER — Inpatient Hospital Stay (HOSPITAL_COMMUNITY): Admit: 2023-03-30 | Payer: Medicare Other | Admitting: Surgery

## 2023-03-30 DIAGNOSIS — E876 Hypokalemia: Secondary | ICD-10-CM | POA: Diagnosis present

## 2023-03-30 DIAGNOSIS — M25532 Pain in left wrist: Secondary | ICD-10-CM | POA: Diagnosis not present

## 2023-03-30 DIAGNOSIS — M25531 Pain in right wrist: Secondary | ICD-10-CM | POA: Diagnosis not present

## 2023-03-30 DIAGNOSIS — Z79899 Other long term (current) drug therapy: Secondary | ICD-10-CM

## 2023-03-30 DIAGNOSIS — S60511A Abrasion of right hand, initial encounter: Secondary | ICD-10-CM | POA: Diagnosis present

## 2023-03-30 DIAGNOSIS — S61412A Laceration without foreign body of left hand, initial encounter: Secondary | ICD-10-CM | POA: Diagnosis not present

## 2023-03-30 DIAGNOSIS — S12401A Unspecified nondisplaced fracture of fifth cervical vertebra, initial encounter for closed fracture: Secondary | ICD-10-CM | POA: Diagnosis not present

## 2023-03-30 DIAGNOSIS — S129XXA Fracture of neck, unspecified, initial encounter: Secondary | ICD-10-CM | POA: Diagnosis not present

## 2023-03-30 DIAGNOSIS — S12601A Unspecified nondisplaced fracture of seventh cervical vertebra, initial encounter for closed fracture: Secondary | ICD-10-CM | POA: Diagnosis not present

## 2023-03-30 DIAGNOSIS — M21371 Foot drop, right foot: Secondary | ICD-10-CM | POA: Diagnosis present

## 2023-03-30 DIAGNOSIS — I10 Essential (primary) hypertension: Secondary | ICD-10-CM | POA: Diagnosis present

## 2023-03-30 DIAGNOSIS — S22069A Unspecified fracture of T7-T8 vertebra, initial encounter for closed fracture: Secondary | ICD-10-CM | POA: Diagnosis present

## 2023-03-30 DIAGNOSIS — S12501A Unspecified nondisplaced fracture of sixth cervical vertebra, initial encounter for closed fracture: Secondary | ICD-10-CM | POA: Diagnosis not present

## 2023-03-30 DIAGNOSIS — M549 Dorsalgia, unspecified: Secondary | ICD-10-CM | POA: Diagnosis not present

## 2023-03-30 DIAGNOSIS — M532X2 Spinal instabilities, cervical region: Secondary | ICD-10-CM | POA: Diagnosis not present

## 2023-03-30 DIAGNOSIS — S22039A Unspecified fracture of third thoracic vertebra, initial encounter for closed fracture: Secondary | ICD-10-CM | POA: Diagnosis not present

## 2023-03-30 DIAGNOSIS — Q8502 Neurofibromatosis, type 2: Secondary | ICD-10-CM

## 2023-03-30 DIAGNOSIS — Z7982 Long term (current) use of aspirin: Secondary | ICD-10-CM | POA: Diagnosis not present

## 2023-03-30 DIAGNOSIS — M5020 Other cervical disc displacement, unspecified cervical region: Secondary | ICD-10-CM | POA: Diagnosis not present

## 2023-03-30 DIAGNOSIS — M47816 Spondylosis without myelopathy or radiculopathy, lumbar region: Secondary | ICD-10-CM | POA: Diagnosis not present

## 2023-03-30 DIAGNOSIS — R079 Chest pain, unspecified: Secondary | ICD-10-CM | POA: Diagnosis not present

## 2023-03-30 DIAGNOSIS — G936 Cerebral edema: Secondary | ICD-10-CM | POA: Diagnosis not present

## 2023-03-30 DIAGNOSIS — R5381 Other malaise: Secondary | ICD-10-CM | POA: Diagnosis present

## 2023-03-30 DIAGNOSIS — D62 Acute posthemorrhagic anemia: Secondary | ICD-10-CM | POA: Diagnosis present

## 2023-03-30 DIAGNOSIS — I959 Hypotension, unspecified: Secondary | ICD-10-CM | POA: Diagnosis not present

## 2023-03-30 DIAGNOSIS — S22009A Unspecified fracture of unspecified thoracic vertebra, initial encounter for closed fracture: Secondary | ICD-10-CM | POA: Insufficient documentation

## 2023-03-30 DIAGNOSIS — S2222XA Fracture of body of sternum, initial encounter for closed fracture: Secondary | ICD-10-CM | POA: Diagnosis not present

## 2023-03-30 DIAGNOSIS — S22031A Stable burst fracture of third thoracic vertebra, initial encounter for closed fracture: Secondary | ICD-10-CM | POA: Diagnosis not present

## 2023-03-30 DIAGNOSIS — I2510 Atherosclerotic heart disease of native coronary artery without angina pectoris: Secondary | ICD-10-CM | POA: Diagnosis not present

## 2023-03-30 DIAGNOSIS — Z8249 Family history of ischemic heart disease and other diseases of the circulatory system: Secondary | ICD-10-CM | POA: Diagnosis not present

## 2023-03-30 DIAGNOSIS — Z981 Arthrodesis status: Secondary | ICD-10-CM | POA: Diagnosis not present

## 2023-03-30 DIAGNOSIS — Z83438 Family history of other disorder of lipoprotein metabolism and other lipidemia: Secondary | ICD-10-CM

## 2023-03-30 DIAGNOSIS — M4802 Spinal stenosis, cervical region: Secondary | ICD-10-CM

## 2023-03-30 DIAGNOSIS — S12500A Unspecified displaced fracture of sixth cervical vertebra, initial encounter for closed fracture: Secondary | ICD-10-CM | POA: Diagnosis not present

## 2023-03-30 DIAGNOSIS — Y92410 Unspecified street and highway as the place of occurrence of the external cause: Secondary | ICD-10-CM

## 2023-03-30 DIAGNOSIS — S129XXS Fracture of neck, unspecified, sequela: Secondary | ICD-10-CM | POA: Diagnosis present

## 2023-03-30 DIAGNOSIS — S12600A Unspecified displaced fracture of seventh cervical vertebra, initial encounter for closed fracture: Secondary | ICD-10-CM | POA: Diagnosis not present

## 2023-03-30 DIAGNOSIS — G9341 Metabolic encephalopathy: Secondary | ICD-10-CM | POA: Diagnosis not present

## 2023-03-30 DIAGNOSIS — Z85820 Personal history of malignant melanoma of skin: Secondary | ICD-10-CM | POA: Diagnosis not present

## 2023-03-30 DIAGNOSIS — M4856XA Collapsed vertebra, not elsewhere classified, lumbar region, initial encounter for fracture: Secondary | ICD-10-CM | POA: Diagnosis not present

## 2023-03-30 DIAGNOSIS — M542 Cervicalgia: Secondary | ICD-10-CM | POA: Diagnosis present

## 2023-03-30 DIAGNOSIS — R1310 Dysphagia, unspecified: Secondary | ICD-10-CM | POA: Diagnosis not present

## 2023-03-30 DIAGNOSIS — I700 Atherosclerosis of aorta: Secondary | ICD-10-CM | POA: Diagnosis not present

## 2023-03-30 DIAGNOSIS — S3991XA Unspecified injury of abdomen, initial encounter: Secondary | ICD-10-CM | POA: Diagnosis not present

## 2023-03-30 DIAGNOSIS — S299XXA Unspecified injury of thorax, initial encounter: Secondary | ICD-10-CM | POA: Diagnosis not present

## 2023-03-30 DIAGNOSIS — S2243XA Multiple fractures of ribs, bilateral, initial encounter for closed fracture: Secondary | ICD-10-CM

## 2023-03-30 DIAGNOSIS — S3992XA Unspecified injury of lower back, initial encounter: Secondary | ICD-10-CM | POA: Diagnosis not present

## 2023-03-30 DIAGNOSIS — E785 Hyperlipidemia, unspecified: Secondary | ICD-10-CM | POA: Diagnosis present

## 2023-03-30 DIAGNOSIS — I6523 Occlusion and stenosis of bilateral carotid arteries: Secondary | ICD-10-CM | POA: Diagnosis not present

## 2023-03-30 DIAGNOSIS — G510 Bell's palsy: Secondary | ICD-10-CM | POA: Diagnosis not present

## 2023-03-30 DIAGNOSIS — Z888 Allergy status to other drugs, medicaments and biological substances status: Secondary | ICD-10-CM

## 2023-03-30 DIAGNOSIS — S199XXA Unspecified injury of neck, initial encounter: Secondary | ICD-10-CM | POA: Diagnosis not present

## 2023-03-30 DIAGNOSIS — S22001A Stable burst fracture of unspecified thoracic vertebra, initial encounter for closed fracture: Secondary | ICD-10-CM

## 2023-03-30 DIAGNOSIS — M40209 Unspecified kyphosis, site unspecified: Secondary | ICD-10-CM | POA: Diagnosis not present

## 2023-03-30 DIAGNOSIS — R29890 Loss of height: Secondary | ICD-10-CM | POA: Diagnosis not present

## 2023-03-30 DIAGNOSIS — Z8619 Personal history of other infectious and parasitic diseases: Secondary | ICD-10-CM

## 2023-03-30 DIAGNOSIS — R0989 Other specified symptoms and signs involving the circulatory and respiratory systems: Secondary | ICD-10-CM | POA: Diagnosis not present

## 2023-03-30 DIAGNOSIS — M4312 Spondylolisthesis, cervical region: Secondary | ICD-10-CM | POA: Diagnosis not present

## 2023-03-30 DIAGNOSIS — S12601G Unspecified nondisplaced fracture of seventh cervical vertebra, subsequent encounter for fracture with delayed healing: Secondary | ICD-10-CM | POA: Diagnosis not present

## 2023-03-30 DIAGNOSIS — M1812 Unilateral primary osteoarthritis of first carpometacarpal joint, left hand: Secondary | ICD-10-CM | POA: Diagnosis not present

## 2023-03-30 DIAGNOSIS — S2249XD Multiple fractures of ribs, unspecified side, subsequent encounter for fracture with routine healing: Secondary | ICD-10-CM | POA: Diagnosis not present

## 2023-03-30 LAB — BASIC METABOLIC PANEL
Anion gap: 10 (ref 5–15)
BUN: 30 mg/dL — ABNORMAL HIGH (ref 8–23)
CO2: 26 mmol/L (ref 22–32)
Calcium: 8.7 mg/dL — ABNORMAL LOW (ref 8.9–10.3)
Chloride: 102 mmol/L (ref 98–111)
Creatinine, Ser: 0.76 mg/dL (ref 0.44–1.00)
GFR, Estimated: >60 mL/min (ref >60)
Glucose, Bld: 168 mg/dL — ABNORMAL HIGH (ref 70–99)
Potassium: 3.9 mmol/L (ref 3.5–5.1)
Sodium: 138 mmol/L (ref 135–145)

## 2023-03-30 LAB — CBC
HCT: 33.8 % — ABNORMAL LOW (ref 36.0–46.0)
Hemoglobin: 10.8 g/dL — ABNORMAL LOW (ref 12.0–15.0)
MCH: 30.3 pg (ref 26.0–34.0)
MCHC: 32 g/dL (ref 30.0–36.0)
MCV: 94.9 fL (ref 80.0–100.0)
Platelets: 279 10*3/uL (ref 150–400)
RBC: 3.56 MIL/uL — ABNORMAL LOW (ref 3.87–5.11)
RDW: 13.2 % (ref 11.5–15.5)
WBC: 9 10*3/uL (ref 4.0–10.5)
nRBC: 0 % (ref 0.0–0.2)

## 2023-03-30 LAB — TROPONIN I (HIGH SENSITIVITY): Troponin I (High Sensitivity): 5 ng/L (ref <18)

## 2023-03-30 MED ORDER — MORPHINE SULFATE (PF) 4 MG/ML IV SOLN
4.0000 mg | Freq: Once | INTRAVENOUS | Status: AC
  Administered 2023-03-30: 4 mg via INTRAVENOUS
  Filled 2023-03-30: qty 1

## 2023-03-30 MED ORDER — ONDANSETRON HCL 4 MG/2ML IJ SOLN
INTRAMUSCULAR | Status: AC
  Filled 2023-03-30: qty 2

## 2023-03-30 MED ORDER — IOHEXOL 350 MG/ML SOLN
100.0000 mL | Freq: Once | INTRAVENOUS | Status: AC | PRN
  Administered 2023-03-30: 100 mL via INTRAVENOUS

## 2023-03-30 MED ORDER — ONDANSETRON HCL 4 MG/2ML IJ SOLN
4.0000 mg | Freq: Once | INTRAMUSCULAR | Status: AC
  Administered 2023-03-30: 4 mg via INTRAVENOUS
  Filled 2023-03-30: qty 2

## 2023-03-30 MED ORDER — MORPHINE SULFATE (PF) 4 MG/ML IV SOLN
INTRAVENOUS | Status: AC
  Filled 2023-03-30: qty 1

## 2023-03-30 NOTE — ED Notes (Signed)
Sweater removed at this time by this RN and Siadecki, MD while holding c-spine. With pt's permission this RN cut off long sleeve under shirt as well as tank top.

## 2023-03-30 NOTE — Telephone Encounter (Signed)
Medication added to chart

## 2023-03-30 NOTE — ED Notes (Signed)
Transfer consent form signed by son at bedside per pt's request. This RN as witness.
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
## 2023-03-30 NOTE — ED Notes (Signed)
Pt in MRI at this time

## 2023-03-31 DIAGNOSIS — R5381 Other malaise: Secondary | ICD-10-CM | POA: Diagnosis present

## 2023-03-31 DIAGNOSIS — E871 Hypo-osmolality and hyponatremia: Secondary | ICD-10-CM | POA: Diagnosis not present

## 2023-03-31 DIAGNOSIS — Z8249 Family history of ischemic heart disease and other diseases of the circulatory system: Secondary | ICD-10-CM | POA: Diagnosis not present

## 2023-03-31 DIAGNOSIS — R197 Diarrhea, unspecified: Secondary | ICD-10-CM | POA: Diagnosis not present

## 2023-03-31 DIAGNOSIS — E43 Unspecified severe protein-calorie malnutrition: Secondary | ICD-10-CM | POA: Diagnosis not present

## 2023-03-31 DIAGNOSIS — G510 Bell's palsy: Secondary | ICD-10-CM | POA: Diagnosis present

## 2023-03-31 DIAGNOSIS — D333 Benign neoplasm of cranial nerves: Secondary | ICD-10-CM | POA: Diagnosis not present

## 2023-03-31 DIAGNOSIS — R7303 Prediabetes: Secondary | ICD-10-CM | POA: Diagnosis not present

## 2023-03-31 DIAGNOSIS — S129XXS Fracture of neck, unspecified, sequela: Secondary | ICD-10-CM | POA: Diagnosis present

## 2023-03-31 DIAGNOSIS — M546 Pain in thoracic spine: Secondary | ICD-10-CM | POA: Diagnosis not present

## 2023-03-31 DIAGNOSIS — S22031A Stable burst fracture of third thoracic vertebra, initial encounter for closed fracture: Secondary | ICD-10-CM | POA: Diagnosis present

## 2023-03-31 DIAGNOSIS — M4802 Spinal stenosis, cervical region: Secondary | ICD-10-CM | POA: Diagnosis present

## 2023-03-31 DIAGNOSIS — S22039A Unspecified fracture of third thoracic vertebra, initial encounter for closed fracture: Secondary | ICD-10-CM | POA: Diagnosis not present

## 2023-03-31 DIAGNOSIS — R4189 Other symptoms and signs involving cognitive functions and awareness: Secondary | ICD-10-CM | POA: Diagnosis not present

## 2023-03-31 DIAGNOSIS — S12601G Unspecified nondisplaced fracture of seventh cervical vertebra, subsequent encounter for fracture with delayed healing: Secondary | ICD-10-CM

## 2023-03-31 DIAGNOSIS — E876 Hypokalemia: Secondary | ICD-10-CM | POA: Diagnosis present

## 2023-03-31 DIAGNOSIS — S12500A Unspecified displaced fracture of sixth cervical vertebra, initial encounter for closed fracture: Secondary | ICD-10-CM

## 2023-03-31 DIAGNOSIS — S2243XA Multiple fractures of ribs, bilateral, initial encounter for closed fracture: Secondary | ICD-10-CM | POA: Diagnosis present

## 2023-03-31 DIAGNOSIS — S2249XD Multiple fractures of ribs, unspecified side, subsequent encounter for fracture with routine healing: Secondary | ICD-10-CM | POA: Diagnosis not present

## 2023-03-31 DIAGNOSIS — R4182 Altered mental status, unspecified: Secondary | ICD-10-CM | POA: Diagnosis not present

## 2023-03-31 DIAGNOSIS — E785 Hyperlipidemia, unspecified: Secondary | ICD-10-CM | POA: Diagnosis present

## 2023-03-31 DIAGNOSIS — G918 Other hydrocephalus: Secondary | ICD-10-CM | POA: Diagnosis not present

## 2023-03-31 DIAGNOSIS — M542 Cervicalgia: Secondary | ICD-10-CM | POA: Diagnosis present

## 2023-03-31 DIAGNOSIS — M25532 Pain in left wrist: Secondary | ICD-10-CM | POA: Diagnosis present

## 2023-03-31 DIAGNOSIS — S129XXA Fracture of neck, unspecified, initial encounter: Secondary | ICD-10-CM | POA: Diagnosis present

## 2023-03-31 DIAGNOSIS — S61412A Laceration without foreign body of left hand, initial encounter: Secondary | ICD-10-CM | POA: Diagnosis present

## 2023-03-31 DIAGNOSIS — D62 Acute posthemorrhagic anemia: Secondary | ICD-10-CM | POA: Diagnosis present

## 2023-03-31 DIAGNOSIS — M25531 Pain in right wrist: Secondary | ICD-10-CM | POA: Diagnosis present

## 2023-03-31 DIAGNOSIS — Z7982 Long term (current) use of aspirin: Secondary | ICD-10-CM | POA: Diagnosis not present

## 2023-03-31 DIAGNOSIS — R410 Disorientation, unspecified: Secondary | ICD-10-CM | POA: Diagnosis not present

## 2023-03-31 DIAGNOSIS — S129XXD Fracture of neck, unspecified, subsequent encounter: Secondary | ICD-10-CM | POA: Diagnosis not present

## 2023-03-31 DIAGNOSIS — I951 Orthostatic hypotension: Secondary | ICD-10-CM | POA: Diagnosis not present

## 2023-03-31 DIAGNOSIS — Z85820 Personal history of malignant melanoma of skin: Secondary | ICD-10-CM | POA: Diagnosis not present

## 2023-03-31 DIAGNOSIS — G936 Cerebral edema: Secondary | ICD-10-CM | POA: Diagnosis not present

## 2023-03-31 DIAGNOSIS — S22069A Unspecified fracture of T7-T8 vertebra, initial encounter for closed fracture: Secondary | ICD-10-CM | POA: Diagnosis present

## 2023-03-31 DIAGNOSIS — R32 Unspecified urinary incontinence: Secondary | ICD-10-CM | POA: Diagnosis not present

## 2023-03-31 DIAGNOSIS — Q8502 Neurofibromatosis, type 2: Secondary | ICD-10-CM | POA: Diagnosis not present

## 2023-03-31 DIAGNOSIS — M532X2 Spinal instabilities, cervical region: Secondary | ICD-10-CM | POA: Diagnosis present

## 2023-03-31 DIAGNOSIS — Z79899 Other long term (current) drug therapy: Secondary | ICD-10-CM | POA: Diagnosis not present

## 2023-03-31 DIAGNOSIS — G9341 Metabolic encephalopathy: Secondary | ICD-10-CM | POA: Diagnosis not present

## 2023-03-31 DIAGNOSIS — S2222XA Fracture of body of sternum, initial encounter for closed fracture: Secondary | ICD-10-CM | POA: Diagnosis present

## 2023-03-31 DIAGNOSIS — S12601A Unspecified nondisplaced fracture of seventh cervical vertebra, initial encounter for closed fracture: Secondary | ICD-10-CM

## 2023-03-31 DIAGNOSIS — Z981 Arthrodesis status: Secondary | ICD-10-CM | POA: Diagnosis not present

## 2023-03-31 DIAGNOSIS — S12591D Other nondisplaced fracture of sixth cervical vertebra, subsequent encounter for fracture with routine healing: Secondary | ICD-10-CM | POA: Diagnosis not present

## 2023-03-31 DIAGNOSIS — G911 Obstructive hydrocephalus: Secondary | ICD-10-CM | POA: Diagnosis not present

## 2023-03-31 DIAGNOSIS — D72829 Elevated white blood cell count, unspecified: Secondary | ICD-10-CM | POA: Diagnosis not present

## 2023-03-31 DIAGNOSIS — Y92410 Unspecified street and highway as the place of occurrence of the external cause: Secondary | ICD-10-CM | POA: Diagnosis not present

## 2023-03-31 DIAGNOSIS — R1310 Dysphagia, unspecified: Secondary | ICD-10-CM | POA: Diagnosis present

## 2023-03-31 DIAGNOSIS — T1490XA Injury, unspecified, initial encounter: Secondary | ICD-10-CM | POA: Diagnosis not present

## 2023-03-31 DIAGNOSIS — S12600A Unspecified displaced fracture of seventh cervical vertebra, initial encounter for closed fracture: Secondary | ICD-10-CM | POA: Diagnosis present

## 2023-03-31 DIAGNOSIS — I2510 Atherosclerotic heart disease of native coronary artery without angina pectoris: Secondary | ICD-10-CM | POA: Diagnosis not present

## 2023-03-31 DIAGNOSIS — I10 Essential (primary) hypertension: Secondary | ICD-10-CM | POA: Diagnosis present

## 2023-03-31 LAB — CBC
HCT: 31.9 % — ABNORMAL LOW (ref 36.0–46.0)
Hemoglobin: 10.3 g/dL — ABNORMAL LOW (ref 12.0–15.0)
MCH: 30.4 pg (ref 26.0–34.0)
MCHC: 32.3 g/dL (ref 30.0–36.0)
MCV: 94.1 fL (ref 80.0–100.0)
Platelets: 183 10*3/uL (ref 150–400)
RBC: 3.39 MIL/uL — ABNORMAL LOW (ref 3.87–5.11)
RDW: 13.2 % (ref 11.5–15.5)
WBC: 11.2 10*3/uL — ABNORMAL HIGH (ref 4.0–10.5)
nRBC: 0 % (ref 0.0–0.2)

## 2023-03-31 LAB — BASIC METABOLIC PANEL
Anion gap: 10 (ref 5–15)
BUN: 24 mg/dL — ABNORMAL HIGH (ref 8–23)
CO2: 26 mmol/L (ref 22–32)
Calcium: 8.7 mg/dL — ABNORMAL LOW (ref 8.9–10.3)
Chloride: 101 mmol/L (ref 98–111)
Creatinine, Ser: 0.54 mg/dL (ref 0.44–1.00)
GFR, Estimated: >60 mL/min (ref >60)
Glucose, Bld: 145 mg/dL — ABNORMAL HIGH (ref 70–99)
Potassium: 3.8 mmol/L (ref 3.5–5.1)
Sodium: 137 mmol/L (ref 135–145)

## 2023-03-31 LAB — GLUCOSE, CAPILLARY: Glucose-Capillary: 100 mg/dL — ABNORMAL HIGH (ref 70–99)

## 2023-03-31 LAB — TROPONIN I (HIGH SENSITIVITY): Troponin I (High Sensitivity): 7 ng/L (ref <18)

## 2023-03-31 LAB — MRSA NEXT GEN BY PCR, NASAL: MRSA by PCR Next Gen: NOT DETECTED

## 2023-03-31 MED ORDER — OXYCODONE HCL 5 MG PO TABS
2.5000 mg | ORAL_TABLET | ORAL | Status: DC | PRN
  Administered 2023-03-31: 5 mg via ORAL
  Filled 2023-03-31: qty 1

## 2023-03-31 MED ORDER — ACETAMINOPHEN 325 MG PO TABS: 650.0000 mg | ORAL_TABLET | Freq: Four times a day (QID) | ORAL | Status: DC

## 2023-03-31 MED ORDER — METHOCARBAMOL 1000 MG/10ML IJ SOLN
500.0000 mg | Freq: Three times a day (TID) | INTRAMUSCULAR | Status: DC
  Administered 2023-03-31 – 2023-04-01 (×4): 500 mg via INTRAVENOUS
  Filled 2023-03-31 (×5): qty 5

## 2023-03-31 MED ORDER — METHOCARBAMOL 500 MG PO TABS: 500.0000 mg | ORAL_TABLET | Freq: Three times a day (TID) | ORAL | Status: DC

## 2023-03-31 MED ORDER — FENTANYL CITRATE PF 50 MCG/ML IJ SOSY
50.0000 ug | PREFILLED_SYRINGE | Freq: Once | INTRAMUSCULAR | Status: AC
  Administered 2023-03-31: 50 ug via INTRAVENOUS
  Filled 2023-03-31: qty 1

## 2023-03-31 MED ORDER — MORPHINE SULFATE (PF) 4 MG/ML IV SOLN
4.0000 mg | Freq: Once | INTRAVENOUS | Status: AC
  Administered 2023-03-31: 4 mg via INTRAVENOUS
  Filled 2023-03-31: qty 1

## 2023-03-31 MED ORDER — ACETAMINOPHEN 10 MG/ML IV SOLN
1000.0000 mg | Freq: Four times a day (QID) | INTRAVENOUS | Status: AC
  Administered 2023-03-31 – 2023-04-01 (×4): 1000 mg via INTRAVENOUS
  Filled 2023-03-31 (×5): qty 100

## 2023-03-31 MED ORDER — LACTATED RINGERS IV SOLN: INTRAVENOUS | Status: AC

## 2023-03-31 MED ORDER — AMLODIPINE BESYLATE 10 MG PO TABS
10.0000 mg | ORAL_TABLET | Freq: Every day | ORAL | Status: DC
  Administered 2023-03-31 – 2023-04-08 (×8): 10 mg via ORAL
  Filled 2023-03-31 (×8): qty 1

## 2023-03-31 MED ORDER — POLYETHYLENE GLYCOL 3350 17 G PO PACK: 17.0000 g | PACK | Freq: Every day | ORAL | Status: DC | PRN

## 2023-03-31 MED ORDER — DOCUSATE SODIUM 100 MG PO CAPS: 100.0000 mg | ORAL_CAPSULE | Freq: Two times a day (BID) | ORAL | Status: DC | PRN

## 2023-03-31 MED ORDER — LIDOCAINE 5 % EX PTCH
2.0000 | MEDICATED_PATCH | CUTANEOUS | Status: DC
  Administered 2023-04-02 – 2023-04-07 (×4): 2 via TRANSDERMAL
  Filled 2023-03-31 (×8): qty 2

## 2023-03-31 MED ORDER — CHLORHEXIDINE GLUCONATE CLOTH 2 % EX PADS
6.0000 | MEDICATED_PAD | Freq: Every day | CUTANEOUS | Status: DC
  Administered 2023-03-31 – 2023-04-07 (×6): 6 via TOPICAL

## 2023-03-31 MED ORDER — MORPHINE SULFATE (PF) 2 MG/ML IV SOLN
2.0000 mg | INTRAVENOUS | Status: DC | PRN
  Administered 2023-03-31: 2 mg via INTRAVENOUS
  Filled 2023-03-31: qty 1
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
## 2023-03-31 NOTE — ED Notes (Addendum)
This RN at bedside and found patient had taken top off c-collar. This RN placed c-collar back in place and education patient on importance of keeping c-collar in place. Pt agreeable. Ray, MD made aware.   Incentive spirometer at bedside and patient educated on use.
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
## 2023-03-31 NOTE — ED Notes (Signed)
Attempted to give report to ICU at this time. Call phone number for call back.

## 2023-03-31 NOTE — ED Notes (Signed)
Patient bladder scan showing . Pt states she does not feel like she needs to go. Patient hooked up to purewick do to being unable to move at this time.
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
## 2023-03-31 NOTE — ED Notes (Signed)
Per Ray, MD after speaking with [facility]- pt not to move to take po pill safety. PO meds held at this time and IV meds ordered by Rosalia Hammers, MD. Patient to continue laying flat.
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
## 2023-04-01 ENCOUNTER — Inpatient Hospital Stay: Payer: No Typology Code available for payment source

## 2023-04-01 ENCOUNTER — Other Ambulatory Visit: Payer: Self-pay

## 2023-04-01 ENCOUNTER — Encounter
Admission: EM | Disposition: A | Discharge status: Rehabilitation Facility | Payer: Self-pay | Source: Home / Self Care | Attending: Pulmonary Disease

## 2023-04-01 ENCOUNTER — Inpatient Hospital Stay: Payer: No Typology Code available for payment source | Admitting: Anesthesiology

## 2023-04-01 DIAGNOSIS — S22009A Unspecified fracture of unspecified thoracic vertebra, initial encounter for closed fracture: Secondary | ICD-10-CM | POA: Insufficient documentation

## 2023-04-01 DIAGNOSIS — S12500A Unspecified displaced fracture of sixth cervical vertebra, initial encounter for closed fracture: Secondary | ICD-10-CM | POA: Diagnosis not present

## 2023-04-01 DIAGNOSIS — S129XXA Fracture of neck, unspecified, initial encounter: Secondary | ICD-10-CM

## 2023-04-01 DIAGNOSIS — M4802 Spinal stenosis, cervical region: Secondary | ICD-10-CM

## 2023-04-01 DIAGNOSIS — S12601G Unspecified nondisplaced fracture of seventh cervical vertebra, subsequent encounter for fracture with delayed healing: Secondary | ICD-10-CM | POA: Diagnosis not present

## 2023-04-01 DIAGNOSIS — M532X2 Spinal instabilities, cervical region: Secondary | ICD-10-CM | POA: Diagnosis not present

## 2023-04-01 DIAGNOSIS — S12601A Unspecified nondisplaced fracture of seventh cervical vertebra, initial encounter for closed fracture: Secondary | ICD-10-CM | POA: Diagnosis not present

## 2023-04-01 DIAGNOSIS — S22039A Unspecified fracture of third thoracic vertebra, initial encounter for closed fracture: Secondary | ICD-10-CM | POA: Diagnosis not present

## 2023-04-01 DIAGNOSIS — S22001A Stable burst fracture of unspecified thoracic vertebra, initial encounter for closed fracture: Secondary | ICD-10-CM

## 2023-04-01 HISTORY — PX: APPLICATION OF INTRAOPERATIVE CT SCAN: SHX6668

## 2023-04-01 HISTORY — PX: ANTERIOR CERVICAL DECOMP/DISCECTOMY FUSION: SHX1161

## 2023-04-01 HISTORY — PX: POSTERIOR CERVICAL FUSION/FORAMINOTOMY: SHX5038

## 2023-04-01 LAB — CBC
HCT: 31.8 % — ABNORMAL LOW (ref 36.0–46.0)
Hemoglobin: 10.6 g/dL — ABNORMAL LOW (ref 12.0–15.0)
MCH: 30.3 pg (ref 26.0–34.0)
MCHC: 33.3 g/dL (ref 30.0–36.0)
MCV: 90.9 fL (ref 80.0–100.0)
Platelets: 189 10*3/uL (ref 150–400)
RBC: 3.5 MIL/uL — ABNORMAL LOW (ref 3.87–5.11)
RDW: 13.2 % (ref 11.5–15.5)
WBC: 8.9 10*3/uL (ref 4.0–10.5)
nRBC: 0 % (ref 0.0–0.2)

## 2023-04-01 LAB — RENAL FUNCTION PANEL
Albumin: 3.6 g/dL (ref 3.5–5.0)
Anion gap: 10 (ref 5–15)
BUN: 17 mg/dL (ref 8–23)
CO2: 25 mmol/L (ref 22–32)
Calcium: 8.4 mg/dL — ABNORMAL LOW (ref 8.9–10.3)
Chloride: 101 mmol/L (ref 98–111)
Creatinine, Ser: 0.52 mg/dL (ref 0.44–1.00)
GFR, Estimated: >60 mL/min (ref >60)
Glucose, Bld: 120 mg/dL — ABNORMAL HIGH (ref 70–99)
Phosphorus: 2.8 mg/dL (ref 2.5–4.6)
Potassium: 3.7 mmol/L (ref 3.5–5.1)
Sodium: 136 mmol/L (ref 135–145)

## 2023-04-01 LAB — PROTIME-INR
INR: 1 (ref 0.8–1.2)
Prothrombin Time: 13.5 s (ref 11.4–15.2)

## 2023-04-01 LAB — ABO/RH: ABO/RH(D): O POS

## 2023-04-01 LAB — MAGNESIUM: Magnesium: 2 mg/dL (ref 1.7–2.4)

## 2023-04-01 SURGERY — ANTERIOR CERVICAL DECOMPRESSION/DISCECTOMY FUSION 1 LEVEL
Anesthesia: General

## 2023-04-01 MED ORDER — FENTANYL CITRATE (PF) 100 MCG/2ML IJ SOLN
INTRAMUSCULAR | Status: DC | PRN
Start: 2023-04-01 — End: 2023-04-01
  Administered 2023-04-01 (×3): 50 ug via INTRAVENOUS

## 2023-04-01 MED ORDER — METHOCARBAMOL 1000 MG/10ML IJ SOLN
500.0000 mg | Freq: Four times a day (QID) | INTRAMUSCULAR | Status: DC | PRN
  Administered 2023-04-01 – 2023-04-02 (×4): 500 mg via INTRAVENOUS
  Filled 2023-04-01 (×5): qty 5

## 2023-04-01 MED ORDER — SENNA 8.6 MG PO TABS
1.0000 | ORAL_TABLET | Freq: Two times a day (BID) | ORAL | Status: DC
  Administered 2023-04-02 – 2023-04-05 (×6): 8.6 mg via ORAL
  Filled 2023-04-01 (×8): qty 1

## 2023-04-01 MED ORDER — ONDANSETRON HCL 4 MG/2ML IJ SOLN
INTRAMUSCULAR | Status: AC
  Filled 2023-04-01: qty 2

## 2023-04-01 MED ORDER — ACETAMINOPHEN 500 MG PO TABS: 1000.0000 mg | ORAL_TABLET | Freq: Four times a day (QID) | ORAL | Status: DC

## 2023-04-01 MED ORDER — REMIFENTANIL HCL 1 MG IV SOLR
INTRAVENOUS | Status: AC
  Filled 2023-04-01: qty 1000

## 2023-04-01 MED ORDER — OXYCODONE HCL 5 MG PO TABS
5.0000 mg | ORAL_TABLET | ORAL | Status: DC | PRN
  Administered 2023-04-02: 5 mg via ORAL
  Filled 2023-04-01: qty 1

## 2023-04-01 MED ORDER — DEXMEDETOMIDINE HCL IN NACL 400 MCG/100ML IV SOLN
0.0000 ug/kg/h | INTRAVENOUS | Status: DC
  Administered 2023-04-01: 0.4 ug/kg/h via INTRAVENOUS
  Administered 2023-04-02: 1 ug/kg/h via INTRAVENOUS
  Filled 2023-04-01 (×2): qty 100

## 2023-04-01 MED ORDER — ONDANSETRON HCL 4 MG/2ML IJ SOLN
4.0000 mg | Freq: Four times a day (QID) | INTRAMUSCULAR | Status: DC | PRN
  Administered 2023-04-04: 4 mg via INTRAVENOUS
  Filled 2023-04-01: qty 2

## 2023-04-01 MED ORDER — SUCCINYLCHOLINE CHLORIDE 200 MG/10ML IV SOSY
PREFILLED_SYRINGE | INTRAVENOUS | Status: DC | PRN
  Administered 2023-04-01: 80 mg via INTRAVENOUS

## 2023-04-01 MED ORDER — SURGIFLO WITH THROMBIN (HEMOSTATIC MATRIX KIT) OPTIME
TOPICAL | Status: DC | PRN
  Administered 2023-04-01: 2 via TOPICAL

## 2023-04-01 MED ORDER — 0.9 % SODIUM CHLORIDE (POUR BTL) OPTIME
TOPICAL | Status: DC | PRN
  Administered 2023-04-01: 500 mL

## 2023-04-01 MED ORDER — SODIUM CHLORIDE (PF) 0.9 % IJ SOLN
INTRAMUSCULAR | Status: DC | PRN
  Administered 2023-04-01: 60 mL

## 2023-04-01 MED ORDER — PHENYLEPHRINE 80 MCG/ML (10ML) SYRINGE FOR IV PUSH (FOR BLOOD PRESSURE SUPPORT)
PREFILLED_SYRINGE | INTRAVENOUS | Status: DC | PRN
  Administered 2023-04-01 (×2): 80 ug via INTRAVENOUS
  Administered 2023-04-01: 160 ug via INTRAVENOUS

## 2023-04-01 MED ORDER — ONDANSETRON HCL 4 MG/2ML IJ SOLN
INTRAMUSCULAR | Status: DC | PRN
  Administered 2023-04-01: 4 mg via INTRAVENOUS

## 2023-04-01 MED ORDER — KETOROLAC TROMETHAMINE 15 MG/ML IJ SOLN
7.5000 mg | Freq: Four times a day (QID) | INTRAMUSCULAR | Status: DC
  Administered 2023-04-01 – 2023-04-02 (×3): 7.5 mg via INTRAVENOUS
  Filled 2023-04-01 (×3): qty 1

## 2023-04-01 MED ORDER — PROPOFOL 10 MG/ML IV BOLUS
INTRAVENOUS | Status: AC
  Filled 2023-04-01: qty 40

## 2023-04-01 MED ORDER — ACETAMINOPHEN 325 MG PO TABS: 650.0000 mg | ORAL_TABLET | ORAL | Status: DC | PRN

## 2023-04-01 MED ORDER — POLYETHYLENE GLYCOL 3350 17 G PO PACK: 17.0000 g | PACK | Freq: Every day | ORAL | Status: DC | PRN

## 2023-04-01 MED ORDER — LABETALOL HCL 5 MG/ML IV SOLN
10.0000 mg | Freq: Once | INTRAVENOUS | Status: AC
  Administered 2023-04-01: 10 mg via INTRAVENOUS

## 2023-04-01 MED ORDER — METHOCARBAMOL 500 MG PO TABS
500.0000 mg | ORAL_TABLET | Freq: Four times a day (QID) | ORAL | Status: DC | PRN
  Filled 2023-04-01 (×2): qty 1

## 2023-04-01 MED ORDER — MORPHINE SULFATE (PF) 2 MG/ML IV SOLN
2.0000 mg | Freq: Once | INTRAVENOUS | Status: AC
  Administered 2023-04-01: 2 mg via INTRAVENOUS

## 2023-04-01 MED ORDER — HYDROMORPHONE HCL 1 MG/ML IJ SOLN
INTRAMUSCULAR | Status: AC
  Filled 2023-04-01: qty 1

## 2023-04-01 MED ORDER — DEXMEDETOMIDINE HCL IN NACL 80 MCG/20ML IV SOLN
INTRAVENOUS | Status: DC | PRN
  Administered 2023-04-01: 12 ug via INTRAVENOUS
  Administered 2023-04-01: 8 ug via INTRAVENOUS

## 2023-04-01 MED ORDER — DOCUSATE SODIUM 100 MG PO CAPS
100.0000 mg | ORAL_CAPSULE | Freq: Two times a day (BID) | ORAL | Status: DC
  Administered 2023-04-02 – 2023-04-05 (×5): 100 mg via ORAL
  Filled 2023-04-01 (×8): qty 1

## 2023-04-01 MED ORDER — HYDROMORPHONE HCL 1 MG/ML IJ SOLN
0.5000 mg | INTRAMUSCULAR | Status: DC | PRN
  Administered 2023-04-01 – 2023-04-02 (×4): 1 mg via INTRAVENOUS
  Filled 2023-04-01 (×4): qty 1

## 2023-04-01 MED ORDER — FENTANYL CITRATE (PF) 250 MCG/5ML IJ SOLN
INTRAMUSCULAR | Status: AC
  Filled 2023-04-01: qty 5

## 2023-04-01 MED ORDER — NICARDIPINE HCL IN NACL 20-0.86 MG/200ML-% IV SOLN
3.0000 mg/h | INTRAVENOUS | Status: DC
  Administered 2023-04-01 (×2): 7.5 mg/h via INTRAVENOUS
  Administered 2023-04-01 – 2023-04-02 (×4): 5 mg/h via INTRAVENOUS
  Filled 2023-04-01 (×7): qty 200

## 2023-04-01 MED ORDER — CEFAZOLIN SODIUM-DEXTROSE 1-4 GM/50ML-% IV SOLN
INTRAVENOUS | Status: DC | PRN
Start: 2023-04-01 — End: 2023-04-01
  Administered 2023-04-01: 1 g via INTRAVENOUS

## 2023-04-01 MED ORDER — LABETALOL HCL 5 MG/ML IV SOLN
INTRAVENOUS | Status: AC
  Filled 2023-04-01: qty 4

## 2023-04-01 MED ORDER — SODIUM CHLORIDE 0.9 % IV SOLN: INTRAVENOUS | Status: DC | PRN

## 2023-04-01 MED ORDER — LABETALOL HCL 5 MG/ML IV SOLN
10.0000 mg | INTRAVENOUS | Status: DC | PRN
  Administered 2023-04-02: 10 mg via INTRAVENOUS
  Filled 2023-04-01: qty 4

## 2023-04-01 MED ORDER — SODIUM CHLORIDE 0.9% FLUSH: 3.0000 mL | INTRAVENOUS | Status: DC | PRN

## 2023-04-01 MED ORDER — SODIUM CHLORIDE 0.9 % IV SOLN: 250.0000 mL | INTRAVENOUS | Status: AC

## 2023-04-01 MED ORDER — REMIFENTANIL HCL 1 MG IV SOLR
INTRAVENOUS | Status: DC | PRN
  Administered 2023-04-01: .2 ug/kg/min via INTRAVENOUS

## 2023-04-01 MED ORDER — PROPOFOL 1000 MG/100ML IV EMUL
INTRAVENOUS | Status: AC
  Filled 2023-04-01: qty 100

## 2023-04-01 MED ORDER — LIDOCAINE HCL (PF) 2 % IJ SOLN
INTRAMUSCULAR | Status: AC
  Filled 2023-04-01: qty 5

## 2023-04-01 MED ORDER — ACETAMINOPHEN 650 MG RE SUPP: 650.0000 mg | RECTAL | Status: DC | PRN

## 2023-04-01 MED ORDER — SUCCINYLCHOLINE CHLORIDE 200 MG/10ML IV SOSY
PREFILLED_SYRINGE | INTRAVENOUS | Status: AC
  Filled 2023-04-01: qty 10

## 2023-04-01 MED ORDER — PHENYLEPHRINE HCL-NACL 20-0.9 MG/250ML-% IV SOLN
INTRAVENOUS | Status: DC | PRN
  Administered 2023-04-01: 40 ug/min via INTRAVENOUS

## 2023-04-01 MED ORDER — MENTHOL 3 MG MT LOZG: 1.0000 | LOZENGE | OROMUCOSAL | Status: DC | PRN

## 2023-04-01 MED ORDER — SORBITOL 70 % SOLN: 30.0000 mL | Freq: Every day | Status: DC | PRN

## 2023-04-01 MED ORDER — BUPIVACAINE-EPINEPHRINE (PF) 0.5% -1:200000 IJ SOLN
INTRAMUSCULAR | Status: DC | PRN
  Administered 2023-04-01: 16 mL

## 2023-04-01 MED ORDER — HALOPERIDOL LACTATE 5 MG/ML IJ SOLN
1.0000 mg | Freq: Four times a day (QID) | INTRAMUSCULAR | Status: DC | PRN
Start: 2023-04-01 — End: 2023-04-02
  Administered 2023-04-01 – 2023-04-02 (×2): 1 mg via INTRAVENOUS
  Filled 2023-04-01 (×2): qty 1

## 2023-04-01 MED ORDER — MORPHINE SULFATE (PF) 2 MG/ML IV SOLN
INTRAVENOUS | Status: AC
  Filled 2023-04-01: qty 1

## 2023-04-01 MED ORDER — KETOROLAC TROMETHAMINE 30 MG/ML IJ SOLN
INTRAMUSCULAR | Status: DC | PRN
  Administered 2023-04-01: 30 mg via INTRAVENOUS

## 2023-04-01 MED ORDER — MORPHINE SULFATE (PF) 2 MG/ML IV SOLN: 1.0000 mg | INTRAVENOUS | Status: DC | PRN

## 2023-04-01 MED ORDER — HYDROMORPHONE HCL 1 MG/ML IJ SOLN
INTRAMUSCULAR | Status: DC | PRN
  Administered 2023-04-01 (×2): .5 mg via INTRAVENOUS

## 2023-04-01 MED ORDER — KETAMINE HCL 50 MG/5ML IJ SOSY
PREFILLED_SYRINGE | INTRAMUSCULAR | Status: DC | PRN
  Administered 2023-04-01: 20 mg via INTRAVENOUS
  Administered 2023-04-01: 30 mg via INTRAVENOUS

## 2023-04-01 MED ORDER — PHENOL 1.4 % MT LIQD: 1.0000 | OROMUCOSAL | Status: DC | PRN

## 2023-04-01 MED ORDER — DEXAMETHASONE SODIUM PHOSPHATE 10 MG/ML IJ SOLN
INTRAMUSCULAR | Status: DC | PRN
  Administered 2023-04-01: 10 mg via INTRAVENOUS

## 2023-04-01 MED ORDER — MAGNESIUM CITRATE PO SOLN: 1.0000 | Freq: Once | ORAL | Status: DC | PRN

## 2023-04-01 MED ORDER — PROPOFOL 10 MG/ML IV BOLUS
INTRAVENOUS | Status: DC | PRN
  Administered 2023-04-01: 70 mg via INTRAVENOUS
  Administered 2023-04-01: 50 mg via INTRAVENOUS
  Administered 2023-04-01: 70 ug/kg/min via INTRAVENOUS

## 2023-04-01 MED ORDER — KETAMINE HCL 50 MG/5ML IJ SOSY
PREFILLED_SYRINGE | INTRAMUSCULAR | Status: AC
Start: 2023-04-01 — End: ?
  Filled 2023-04-01: qty 5

## 2023-04-01 MED ORDER — LIDOCAINE HCL (CARDIAC) PF 100 MG/5ML IV SOSY
PREFILLED_SYRINGE | INTRAVENOUS | Status: DC | PRN
  Administered 2023-04-01: 60 mg via INTRAVENOUS

## 2023-04-01 MED ORDER — PHENYLEPHRINE HCL-NACL 20-0.9 MG/250ML-% IV SOLN
INTRAVENOUS | Status: AC
Start: 2023-04-01 — End: ?
  Filled 2023-04-01: qty 250

## 2023-04-01 MED ORDER — OXYCODONE HCL 5 MG PO TABS: 10.0000 mg | ORAL_TABLET | ORAL | Status: DC | PRN

## 2023-04-01 MED ORDER — DEXAMETHASONE SODIUM PHOSPHATE 10 MG/ML IJ SOLN
INTRAMUSCULAR | Status: AC
  Filled 2023-04-01: qty 1

## 2023-04-01 MED ORDER — SODIUM CHLORIDE 0.9% FLUSH
3.0000 mL | Freq: Two times a day (BID) | INTRAVENOUS | Status: DC
  Administered 2023-04-01 – 2023-04-08 (×15): 3 mL via INTRAVENOUS

## 2023-04-01 MED ORDER — ENOXAPARIN SODIUM 40 MG/0.4ML IJ SOSY
40.0000 mg | PREFILLED_SYRINGE | INTRAMUSCULAR | Status: DC
  Administered 2023-04-02 – 2023-04-08 (×7): 40 mg via SUBCUTANEOUS
  Filled 2023-04-01 (×7): qty 0.4

## 2023-04-01 MED ORDER — ONDANSETRON HCL 4 MG PO TABS: 4.0000 mg | ORAL_TABLET | Freq: Four times a day (QID) | ORAL | Status: DC | PRN

## 2023-04-01 SURGICAL SUPPLY — 76 items
ALLOGRAFT BONE FIBER KORE 10CC (Bone Implant) IMPLANT
ALLOGRAFT BONE FIBER KORE 1CC (Bone Implant) IMPLANT
BASIN KIT SINGLE STR (MISCELLANEOUS) ×1 IMPLANT
BIT DRILL NS LF DISP RELINE C (BIT) IMPLANT
BIT DRL NS LF DISP RELINE C (BIT) ×1 IMPLANT
BUR NEURO DRILL SOFT 3.0X3.8M (BURR) ×1 IMPLANT
COVERAGE SUPP BRAINLAB NG SPNE (MISCELLANEOUS) ×1 IMPLANT
COVERAGE SUPPORT SPINE BRAINLB (MISCELLANEOUS) ×1
DERMABOND ADVANCED .7 DNX12 (GAUZE/BANDAGES/DRESSINGS) ×1 IMPLANT
DRAIN CHANNEL JP 10F RND 20C F (MISCELLANEOUS) IMPLANT
DRAPE C ARM PK CFD 31 SPINE (DRAPES) ×1 IMPLANT
DRAPE LAPAROTOMY 100X77 ABD (DRAPES) ×1 IMPLANT
DRAPE LAPAROTOMY 77X122 PED (DRAPES) ×1 IMPLANT
DRAPE MICROSCOPE SPINE 48X150 (DRAPES) ×1 IMPLANT
DRAPE SCAN PATIENT (DRAPES) ×1 IMPLANT
DRSG TEGADERM 2-3/8X2-3/4 SM (GAUZE/BANDAGES/DRESSINGS) IMPLANT
DRSG TEGADERM 4X4.75 (GAUZE/BANDAGES/DRESSINGS) IMPLANT
ELECT REM PT RETURN 9FT ADLT (ELECTROSURGICAL) ×2 IMPLANT
ELECTRODE REM PT RTRN 9FT ADLT (ELECTROSURGICAL) ×1 IMPLANT
EVACUATOR 1/8 PVC DRAIN (DRAIN) ×1 IMPLANT
EVACUATOR SILICONE 100CC (DRAIN) IMPLANT
FEE CVG SUPP BRAINLAB NG SPNE (MISCELLANEOUS) IMPLANT
GAUZE SPONGE 2X2 STRL 8-PLY (GAUZE/BANDAGES/DRESSINGS) IMPLANT
GLOVE BIOGEL PI IND STRL 6.5 (GLOVE) ×1 IMPLANT
GLOVE SURG SYN 6.5 ES PF (GLOVE) ×8 IMPLANT
GLOVE SURG SYN 6.5 PF PI (GLOVE) ×1 IMPLANT
GLOVE SURG SYN 8.5 E (GLOVE) ×6 IMPLANT
GLOVE SURG SYN 8.5 PF PI (GLOVE) ×3 IMPLANT
GOWN SRG LRG LVL 4 IMPRV REINF (GOWNS) ×1 IMPLANT
GOWN SRG XL LVL 3 NONREINFORCE (GOWNS) ×1 IMPLANT
HOLDER FOLEY CATH W/STRAP (MISCELLANEOUS) ×1 IMPLANT
JELLY LUBE 2OZ STRL (MISCELLANEOUS) IMPLANT
JET LAVAGE IRRISEPT WOUND (IRRIGATION / IRRIGATOR) ×1 IMPLANT
KIT PREVENA INCISION MGT 13 (CANNISTER) ×1 IMPLANT
KIT SPINAL PRONEVIEW (KITS) ×1 IMPLANT
KIT TURNOVER KIT A (KITS) ×1 IMPLANT
LAVAGE JET IRRISEPT WOUND (IRRIGATION / IRRIGATOR) ×1 IMPLANT
MANIFOLD NEPTUNE II (INSTRUMENTS) ×1 IMPLANT
MARKER SKIN DUAL TIP RULER LAB (MISCELLANEOUS) ×1 IMPLANT
MARKER SPHERE PSV REFLC 13MM (MARKER) ×7 IMPLANT
NDL SAFETY ECLIPSE 18X1.5 (NEEDLE) ×1 IMPLANT
NS IRRIG 1000ML POUR BTL (IV SOLUTION) ×1 IMPLANT
NS IRRIG 500ML POUR BTL (IV SOLUTION) ×1 IMPLANT
PACK LAMINECTOMY ARMC (PACKS) ×1 IMPLANT
PAD ARMBOARD 7.5X6 YLW CONV (MISCELLANEOUS) ×2 IMPLANT
PIN CASPAR 14 (PIN) ×1 IMPLANT
PIN CASPAR 14MM (PIN) ×1 IMPLANT
PLATE ACP 1.6X16 1LVL (Plate) IMPLANT
ROD STRT RELINE C 3.5X120 (Rod) IMPLANT
SCREW ACP VA SD 3.5X15 (Screw) IMPLANT
SCREW LOCK RELINE C OPEN (Screw) IMPLANT
SCREW MA RELINE C 3.5X14 (Screw) ×6 IMPLANT
SCREW MA RELINE C 3.5X16 (Screw) ×1 IMPLANT
SCREW RELINE C MA 5.5X25 (Screw) ×2 IMPLANT
SCREW SP MA RELINE-C 3.5X14 (Screw) IMPLANT
SCREW SP MA RELINE-C 3.5X16 (Screw) IMPLANT
SCREW SP MA TH RELINE-C 3.5X30 (Screw) IMPLANT
SCREW SP MA TH RELINE-C 5.5X25 (Screw) IMPLANT
SCREW SP MA TH RELINE-C 5.5X35 (Screw) IMPLANT
SCREW SPINAL RELINE-C 3.5 X 30 (Screw) ×2 IMPLANT
SCREW SPINAL RELINE-C 5.5X35 (Screw) ×4 IMPLANT
SPACER C HEDRON 12X14 6 7D (Spacer) IMPLANT
SPONGE KITTNER 5P (MISCELLANEOUS) ×1 IMPLANT
STAPLER SKIN PROX 35W (STAPLE) ×2 IMPLANT
SURGIFLO W/THROMBIN 8M KIT (HEMOSTASIS) ×1 IMPLANT
SUT ETHILON 3-0 FS-10 30 BLK (SUTURE) ×2 IMPLANT
SUT STRATA 3-0 15 PS-2 (SUTURE) ×1 IMPLANT
SUT VIC AB 0 CT1 27XCR 8 STRN (SUTURE) ×3 IMPLANT
SUT VIC AB 2-0 CT1 18 (SUTURE) ×2 IMPLANT
SUT VIC AB 3-0 SH 8-18 (SUTURE) ×1 IMPLANT
SUTURE EHLN 3-0 FS-10 30 BLK (SUTURE) IMPLANT
SYR 20ML LL LF (SYRINGE) ×1 IMPLANT
SYR 30ML LL (SYRINGE) ×2 IMPLANT
TAPE CLOTH 3X10 WHT NS LF (GAUZE/BANDAGES/DRESSINGS) ×2 IMPLANT
TRAP FLUID SMOKE EVACUATOR (MISCELLANEOUS) ×1 IMPLANT
TRAY FOLEY SLVR 16FR LF STAT (SET/KITS/TRAYS/PACK) ×1 IMPLANT
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
## 2023-04-01 NOTE — Plan of Care (Signed)
  Problem: Education: Goal: Knowledge of General Education information will improve Description: Including pain rating scale, medication(s)/side effects and non-pharmacologic comfort measures Outcome: Not Progressing   Problem: Health Behavior/Discharge Planning: Goal: Ability to manage health-related needs will improve Outcome: Not Progressing   Problem: Clinical Measurements: Goal: Ability to maintain clinical measurements within normal limits will improve Outcome: Not Progressing Goal: Will remain free from infection Outcome: Not Progressing Goal: Diagnostic test results will improve Outcome: Not Progressing Goal: Respiratory complications will improve Outcome: Not Progressing Goal: Cardiovascular complication will be avoided Outcome: Not Progressing   Problem: Activity: Goal: Risk for activity intolerance will decrease Outcome: Not Progressing   Problem: Nutrition: Goal: Adequate nutrition will be maintained Outcome: Not Progressing   Problem: Coping: Goal: Level of anxiety will decrease Outcome: Not Progressing   Problem: Elimination: Goal: Will not experience complications related to bowel motility Outcome: Not Progressing Goal: Will not experience complications related to urinary retention Outcome: Not Progressing   Problem: Pain Managment: Goal: General experience of comfort will improve and/or be controlled Outcome: Not Progressing   Problem: Safety: Goal: Ability to remain free from injury will improve Outcome: Not Progressing   Problem: Skin Integrity: Goal: Risk for impaired skin integrity will decrease Outcome: Not Progressing   Problem: Education: Goal: Ability to verbalize activity precautions or restrictions will improve Outcome: Not Progressing Goal: Knowledge of the prescribed therapeutic regimen will improve Outcome: Not Progressing Goal: Understanding of discharge needs will improve Outcome: Not Progressing   Problem: Activity: Goal:  Ability to avoid complications of mobility impairment will improve Outcome: Not Progressing Goal: Ability to tolerate increased activity will improve Outcome: Not Progressing Goal: Will remain free from falls Outcome: Not Progressing   Problem: Bowel/Gastric: Goal: Gastrointestinal status for postoperative course will improve Outcome: Not Progressing   Problem: Clinical Measurements: Goal: Ability to maintain clinical measurements within normal limits will improve Outcome: Not Progressing Goal: Postoperative complications will be avoided or minimized Outcome: Not Progressing Goal: Diagnostic test results will improve Outcome: Not Progressing   Problem: Pain Management: Goal: Pain level will decrease Outcome: Not Progressing   Problem: Skin Integrity: Goal: Will show signs of wound healing Outcome: Not Progressing   Problem: Health Behavior/Discharge Planning: Goal: Identification of resources available to assist in meeting health care needs will improve Outcome: Not Progressing   Problem: Bladder/Genitourinary: Goal: Urinary functional status for postoperative course will improve Outcome: Not Progressing
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
## 2023-04-01 NOTE — Interval H&P Note (Signed)
History and Physical Interval Note:  04/01/2023 6:57 AM  Rita Taylor  has presented today for surgery, with the diagnosis of three column cervical fracture, C5-6 disc injury, cervical stenosis, T3 fracture,cervical instability.  The various methods of treatment have been discussed with the patient and family. After consideration of risks, benefits and other options for treatment, the patient has consented to  Procedure(s) with comments: ANTERIOR CERVICAL DECOMPRESSION/DISCECTOMY FUSION 1 LEVEL (N/A) - C5-6 ACDF C4-T4 posterior fusion, ORIF C6, C7, T3 fractures (N/A) - with Brainlab, Globus APPLICATION OF INTRAOPERATIVE CT SCAN (N/A) as a surgical intervention.  The patient's history has been reviewed, patient examined, no change in status, stable for surgery.  I have reviewed the patient's chart and labs.  Questions were answered to the patient's satisfaction.    Heart sounds normal no MRG. Chest Clear to Auscultation Bilaterally.    Tremar Wickens

## 2023-04-01 NOTE — Transfer of Care (Signed)
Immediate Anesthesia Transfer of Care Note  Patient: Rita Taylor  Procedure(s) Performed: ANTERIOR CERVICAL DECOMPRESSION/DISCECTOMY FUSION 1 LEVEL C4-T4 posterior fusion, ORIF C6, C7, T3 fractures APPLICATION OF INTRAOPERATIVE CT SCAN  Patient Location: PACU  Anesthesia Type:General  Level of Consciousness: drowsy  Airway & Oxygen Therapy: Patient Spontanous Breathing and Patient connected to face mask oxygen  Post-op Assessment: Report given to RN, Post -op Vital signs reviewed and stable, and Patient moving all extremities  Post vital signs: Reviewed and stable  Last Vitals:  Vitals Value Taken Time  BP 150/75 04/01/23 1300  Temp    Pulse 96 04/01/23 1303  Resp 14 04/01/23 1303  SpO2 100 % 04/01/23 1303  Vitals shown include unfiled device data.  Last Pain:  Vitals:   04/01/23 0400  TempSrc: Oral  PainSc: Asleep      Patients Stated Pain Goal: 0 (03/31/23 1730)  Complications: No notable events documented.

## 2023-04-01 NOTE — Anesthesia Procedure Notes (Signed)
Arterial Line Insertion Start/End2/14/2025 7:45 AM, 04/01/2023 7:50 AM Performed by: Corinda Gubler, MD, anesthesiologist  Patient location: OR. Preanesthetic checklist: patient identified, IV checked, site marked, risks and benefits discussed, surgical consent, monitors and equipment checked, pre-op evaluation, timeout performed and anesthesia consent Right, radial was placed Catheter size: 20 G Hand hygiene performed  and maximum sterile barriers used   Attempts: 1 Procedure performed using ultrasound guided technique. Following insertion, dressing applied. Post procedure assessment: normal and unchanged  Patient tolerated the procedure well with no immediate complications.

## 2023-04-01 NOTE — Progress Notes (Addendum)
0720 patient alert able to make needs known on room air patient leaving for surgery at this time unable to complete full assessment. 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

## 2023-04-01 NOTE — Anesthesia Procedure Notes (Signed)
Procedure Name: Intubation Date/Time: 04/01/2023 7:42 AM  Performed by: Katherine Basset, CRNAPre-anesthesia Checklist: Patient identified, Emergency Drugs available, Suction available and Patient being monitored Patient Re-evaluated:Patient Re-evaluated prior to induction Oxygen Delivery Method: Circle system utilized Preoxygenation: Pre-oxygenation with 100% oxygen Induction Type: IV induction Laryngoscope Size: McGrath and 3 Grade View: Grade I Tube type: Oral Tube size: 6.5 mm Number of attempts: 1 Airway Equipment and Method: Stylet, Oral airway, LTA kit utilized and Bite block Placement Confirmation: ETT inserted through vocal cords under direct vision, positive ETCO2 and breath sounds checked- equal and bilateral Secured at: 20 cm Tube secured with: Tape Dental Injury: Teeth and Oropharynx as per pre-operative assessment  Comments: Cervical instability, neutral neck stabilization throughout induction/intubation, Pt tolerated well

## 2023-04-02 DIAGNOSIS — S12601G Unspecified nondisplaced fracture of seventh cervical vertebra, subsequent encounter for fracture with delayed healing: Secondary | ICD-10-CM | POA: Diagnosis not present

## 2023-04-02 LAB — RENAL FUNCTION PANEL
Albumin: 2.9 g/dL — ABNORMAL LOW (ref 3.5–5.0)
Anion gap: 7 (ref 5–15)
BUN: 27 mg/dL — ABNORMAL HIGH (ref 8–23)
CO2: 23 mmol/L (ref 22–32)
Calcium: 7.6 mg/dL — ABNORMAL LOW (ref 8.9–10.3)
Chloride: 108 mmol/L (ref 98–111)
Creatinine, Ser: 0.52 mg/dL (ref 0.44–1.00)
GFR, Estimated: >60 mL/min (ref >60)
Glucose, Bld: 179 mg/dL — ABNORMAL HIGH (ref 70–99)
Phosphorus: 2.8 mg/dL (ref 2.5–4.6)
Potassium: 4 mmol/L (ref 3.5–5.1)
Sodium: 138 mmol/L (ref 135–145)

## 2023-04-02 LAB — CBC
HCT: 21.5 % — ABNORMAL LOW (ref 36.0–46.0)
Hemoglobin: 7.1 g/dL — ABNORMAL LOW (ref 12.0–15.0)
MCH: 30.1 pg (ref 26.0–34.0)
MCHC: 33 g/dL (ref 30.0–36.0)
MCV: 91.1 fL (ref 80.0–100.0)
Platelets: 137 10*3/uL — ABNORMAL LOW (ref 150–400)
RBC: 2.36 MIL/uL — ABNORMAL LOW (ref 3.87–5.11)
RDW: 12.8 % (ref 11.5–15.5)
WBC: 8.9 10*3/uL (ref 4.0–10.5)
nRBC: 0 % (ref 0.0–0.2)

## 2023-04-02 LAB — HEMOGLOBIN AND HEMATOCRIT, BLOOD
HCT: 25.9 % — ABNORMAL LOW (ref 36.0–46.0)
Hemoglobin: 9 g/dL — ABNORMAL LOW (ref 12.0–15.0)

## 2023-04-02 LAB — MAGNESIUM: Magnesium: 2.1 mg/dL (ref 1.7–2.4)

## 2023-04-02 LAB — PREPARE RBC (CROSSMATCH)

## 2023-04-02 MED ORDER — ACETAMINOPHEN 325 MG PO TABS
975.0000 mg | ORAL_TABLET | Freq: Three times a day (TID) | ORAL | Status: DC
  Administered 2023-04-02 – 2023-04-08 (×14): 975 mg via ORAL
  Filled 2023-04-02 (×15): qty 3

## 2023-04-02 MED ORDER — HYDROMORPHONE HCL 1 MG/ML IJ SOLN
0.5000 mg | INTRAMUSCULAR | Status: DC | PRN
  Administered 2023-04-05: 1 mg via INTRAVENOUS
  Filled 2023-04-02 (×2): qty 1

## 2023-04-02 MED ORDER — QUETIAPINE FUMARATE 25 MG PO TABS
12.5000 mg | ORAL_TABLET | Freq: Every day | ORAL | Status: DC
Start: 2023-04-02 — End: 2023-04-03
  Administered 2023-04-02: 12.5 mg via ORAL
  Filled 2023-04-02: qty 1

## 2023-04-02 MED ORDER — SODIUM CHLORIDE 0.9% IV SOLUTION: Freq: Once | INTRAVENOUS | Status: AC

## 2023-04-02 MED ORDER — ACETAMINOPHEN 650 MG RE SUPP: 650.0000 mg | Freq: Three times a day (TID) | RECTAL | Status: DC

## 2023-04-02 MED ORDER — ENSURE ENLIVE PO LIQD
237.0000 mL | Freq: Two times a day (BID) | ORAL | Status: DC
  Administered 2023-04-02 – 2023-04-07 (×8): 237 mL via ORAL

## 2023-04-02 MED ORDER — LOSARTAN POTASSIUM 50 MG PO TABS
50.0000 mg | ORAL_TABLET | Freq: Every day | ORAL | Status: DC
  Administered 2023-04-02 – 2023-04-03 (×2): 50 mg via ORAL
  Filled 2023-04-02 (×2): qty 1
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
## 2023-04-02 NOTE — Anesthesia Postprocedure Evaluation (Signed)
 Anesthesia Post Note  Patient: Rita Taylor  Procedure(s) Performed: ANTERIOR CERVICAL DECOMPRESSION/DISCECTOMY FUSION 1 LEVEL C4-T4 posterior fusion, ORIF C6, C7, T3 fractures APPLICATION OF INTRAOPERATIVE CT SCAN  Patient location during evaluation: SICU Anesthesia Type: General Level of consciousness: sedated and confused Pain management: pain level controlled Vital Signs Assessment: vitals unstable Respiratory status: spontaneous breathing Cardiovascular status: unstable Postop Assessment: no apparent nausea or vomiting Anesthetic complications: no Comments: Patient's status decreased overnight as she is A&O to herself only. Respiratory status decreased overnight. Patient went from room air to requiring 6L of O2 on [place]. Patient's saturations in the mid 90s. Patient's blood pressure is uncontrolled in the 180s/90s. Patient was started on a cardene drip with a goal of <160. Neurosurgery and critical care are aware and are managing patient.   No notable events documented.   Last Vitals:  Vitals:   04/02/23 0700 04/02/23 0733  BP: (!) 114/36   Pulse: 93 99  Resp: 18 20  Temp:  (!) 36.4 C  SpO2: 92% 91%    Last Pain:  Vitals:   04/02/23 0733  TempSrc: Axillary  PainSc:                  Stephanie Coup

## 2023-04-02 NOTE — Plan of Care (Signed)
  Problem: Education: Goal: Knowledge of General Education information will improve Description: Including pain rating scale, medication(s)/side effects and non-pharmacologic comfort measures Outcome: Not Progressing   Problem: Health Behavior/Discharge Planning: Goal: Ability to manage health-related needs will improve Outcome: Not Progressing   Problem: Clinical Measurements: Goal: Ability to maintain clinical measurements within normal limits will improve Outcome: Not Progressing Goal: Will remain free from infection Outcome: Not Progressing Goal: Diagnostic test results will improve Outcome: Not Progressing Goal: Respiratory complications will improve Outcome: Not Progressing Goal: Cardiovascular complication will be avoided Outcome: Not Progressing   Problem: Activity: Goal: Risk for activity intolerance will decrease Outcome: Progressing   Problem: Nutrition: Goal: Adequate nutrition will be maintained Outcome: Progressing   Problem: Coping: Goal: Level of anxiety will decrease Outcome: Not Progressing   Problem: Elimination: Goal: Will not experience complications related to bowel motility Outcome: Not Progressing Goal: Will not experience complications related to urinary retention Outcome: Not Progressing   Problem: Pain Managment: Goal: General experience of comfort will improve and/or be controlled Outcome: Progressing   Problem: Safety: Goal: Ability to remain free from injury will improve Outcome: Not Progressing   Problem: Skin Integrity: Goal: Risk for impaired skin integrity will decrease Outcome: Not Progressing   Problem: Education: Goal: Ability to verbalize activity precautions or restrictions will improve Outcome: Not Progressing Goal: Knowledge of the prescribed therapeutic regimen will improve Outcome: Not Progressing Goal: Understanding of discharge needs will improve Outcome: Not Progressing   Problem: Activity: Goal: Ability to  avoid complications of mobility impairment will improve Outcome: Progressing Goal: Ability to tolerate increased activity will improve Outcome: Not Progressing Goal: Will remain free from falls Outcome: Not Progressing   Problem: Bowel/Gastric: Goal: Gastrointestinal status for postoperative course will improve Outcome: Not Progressing   Problem: Clinical Measurements: Goal: Ability to maintain clinical measurements within normal limits will improve Outcome: Not Progressing Goal: Postoperative complications will be avoided or minimized Outcome: Not Progressing Goal: Diagnostic test results will improve Outcome: Not Progressing   Problem: Pain Management: Goal: Pain level will decrease Outcome: Progressing   Problem: Skin Integrity: Goal: Will show signs of wound healing Outcome: Progressing   Problem: Health Behavior/Discharge Planning: Goal: Identification of resources available to assist in meeting health care needs will improve Outcome: Not Progressing   Problem: Bladder/Genitourinary: Goal: Urinary functional status for postoperative course will improve Outcome: Not Progressing
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
## 2023-04-03 DIAGNOSIS — S12601G Unspecified nondisplaced fracture of seventh cervical vertebra, subsequent encounter for fracture with delayed healing: Secondary | ICD-10-CM | POA: Diagnosis not present

## 2023-04-03 LAB — RENAL FUNCTION PANEL
Albumin: 3.2 g/dL — ABNORMAL LOW (ref 3.5–5.0)
Anion gap: 8 (ref 5–15)
BUN: 24 mg/dL — ABNORMAL HIGH (ref 8–23)
CO2: 23 mmol/L (ref 22–32)
Calcium: 7.9 mg/dL — ABNORMAL LOW (ref 8.9–10.3)
Chloride: 109 mmol/L (ref 98–111)
Creatinine, Ser: 0.53 mg/dL (ref 0.44–1.00)
GFR, Estimated: >60 mL/min (ref >60)
Glucose, Bld: 138 mg/dL — ABNORMAL HIGH (ref 70–99)
Phosphorus: 1.6 mg/dL — ABNORMAL LOW (ref 2.5–4.6)
Potassium: 3.4 mmol/L — ABNORMAL LOW (ref 3.5–5.1)
Sodium: 140 mmol/L (ref 135–145)

## 2023-04-03 LAB — CBC
HCT: 26.6 % — ABNORMAL LOW (ref 36.0–46.0)
Hemoglobin: 9 g/dL — ABNORMAL LOW (ref 12.0–15.0)
MCH: 29.7 pg (ref 26.0–34.0)
MCHC: 33.8 g/dL (ref 30.0–36.0)
MCV: 87.8 fL (ref 80.0–100.0)
Platelets: 174 10*3/uL (ref 150–400)
RBC: 3.03 MIL/uL — ABNORMAL LOW (ref 3.87–5.11)
RDW: 15 % (ref 11.5–15.5)
WBC: 10.6 10*3/uL — ABNORMAL HIGH (ref 4.0–10.5)
nRBC: 0 % (ref 0.0–0.2)

## 2023-04-03 MED ORDER — LOSARTAN POTASSIUM 50 MG PO TABS
100.0000 mg | ORAL_TABLET | Freq: Every day | ORAL | Status: DC
  Administered 2023-04-04 – 2023-04-08 (×5): 100 mg via ORAL
  Filled 2023-04-03 (×5): qty 2

## 2023-04-03 MED ORDER — METOPROLOL TARTRATE 5 MG/5ML IV SOLN
2.5000 mg | Freq: Once | INTRAVENOUS | Status: AC
  Administered 2023-04-03: 2.5 mg via INTRAVENOUS
  Filled 2023-04-03: qty 5

## 2023-04-03 MED ORDER — POTASSIUM PHOSPHATES 15 MMOLE/5ML IV SOLN
30.0000 mmol | Freq: Once | INTRAVENOUS | Status: AC
  Administered 2023-04-03: 30 mmol via INTRAVENOUS
  Filled 2023-04-03: qty 10

## 2023-04-03 MED ORDER — GABAPENTIN 300 MG PO CAPS
300.0000 mg | ORAL_CAPSULE | Freq: Three times a day (TID) | ORAL | Status: DC
Start: 2023-04-03 — End: 2023-04-08
  Administered 2023-04-03 – 2023-04-08 (×17): 300 mg via ORAL
  Filled 2023-04-03 (×18): qty 1

## 2023-04-03 MED ORDER — POTASSIUM CHLORIDE 20 MEQ PO PACK
40.0000 meq | PACK | Freq: Once | ORAL | Status: AC
  Administered 2023-04-03: 40 meq via ORAL
  Filled 2023-04-03: qty 2

## 2023-04-03 MED ORDER — LOSARTAN POTASSIUM 50 MG PO TABS
50.0000 mg | ORAL_TABLET | Freq: Once | ORAL | Status: AC
  Administered 2023-04-03: 50 mg via ORAL
  Filled 2023-04-03: qty 1

## 2023-04-03 MED ORDER — BUPROPION HCL ER (XL) 150 MG PO TB24
150.0000 mg | ORAL_TABLET | Freq: Every day | ORAL | Status: DC
  Administered 2023-04-03 – 2023-04-08 (×6): 150 mg via ORAL
  Filled 2023-04-03 (×6): qty 1

## 2023-04-03 NOTE — Plan of Care (Signed)
  Problem: Education: Goal: Knowledge of General Education information will improve Description: Including pain rating scale, medication(s)/side effects and non-pharmacologic comfort measures Outcome: Not Progressing   Problem: Clinical Measurements: Goal: Ability to maintain clinical measurements within normal limits will improve Outcome: Not Progressing Goal: Will remain free from infection Outcome: Not Progressing Goal: Diagnostic test results will improve Outcome: Not Progressing Goal: Respiratory complications will improve Outcome: Not Progressing Goal: Cardiovascular complication will be avoided Outcome: Not Progressing   Problem: Activity: Goal: Risk for activity intolerance will decrease Outcome: Not Progressing   Problem: Nutrition: Goal: Adequate nutrition will be maintained Outcome: Not Progressing   Problem: Coping: Goal: Level of anxiety will decrease Outcome: Not Progressing   Problem: Elimination: Goal: Will not experience complications related to bowel motility Outcome: Not Progressing   Problem: Elimination: Goal: Will not experience complications related to urinary retention Outcome: Not Progressing   Problem: Pain Managment: Goal: General experience of comfort will improve and/or be controlled Outcome: Not Progressing   Problem: Safety: Goal: Ability to remain free from injury will improve Outcome: Not Progressing   Problem: Skin Integrity: Goal: Risk for impaired skin integrity will decrease Outcome: Not Progressing   Problem: Education: Goal: Ability to verbalize activity precautions or restrictions will improve Outcome: Not Progressing Goal: Knowledge of the prescribed therapeutic regimen will improve Outcome: Not Progressing Goal: Understanding of discharge needs will improve Outcome: Not Progressing   Problem: Activity: Goal: Ability to avoid complications of mobility impairment will improve Outcome: Not Progressing Goal: Ability to  tolerate increased activity will improve Outcome: Not Progressing Goal: Will remain free from falls Outcome: Not Progressing   Problem: Bowel/Gastric: Goal: Gastrointestinal status for postoperative course will improve Outcome: Not Progressing   Problem: Clinical Measurements: Goal: Ability to maintain clinical measurements within normal limits will improve Outcome: Not Progressing Goal: Postoperative complications will be avoided or minimized Outcome: Not Progressing Goal: Diagnostic test results will improve Outcome: Not Progressing   Problem: Pain Management: Goal: Pain level will decrease Outcome: Not Progressing   Problem: Skin Integrity: Goal: Will show signs of wound healing Outcome: Not Progressing   Problem: Health Behavior/Discharge Planning: Goal: Identification of resources available to assist in meeting health care needs will improve Outcome: Not Progressing   Problem: Bladder/Genitourinary: Goal: Urinary functional status for postoperative course will improve Outcome: Not Progressing
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
## 2023-04-03 NOTE — Progress Notes (Addendum)
 0730 patient in bed on room air alert to self only mitts on patient restless.patient unable to make all needs known due to confusion.  1800 patient total care needs to be asked if she has to use bathroom or she will not go or be incontinent. Patient shuffles feet with walker max assist to stand and get out of bed patient doesn't follow directions well. Patient will just try to sit with nothing behind her she leans forward on BSC and sitting to side of bed needs to have staff literally have a hand on her so she doesn't fall forward.  Patient needs to be feed meals having trouble chewing food needs constant reminders to chew and swallow food, patient will not open mouth even for food in needs to be reminded to open mouth. Diet down graded for patient safety. Patient drinks with a straw on right side of mouth but will need reminders how to drink from a straw. Patient even water with spoon and patient doesn't attempt to take it or swallow it she lets it fall from her mouth.  1845 patient back in bed after BSC mitt placed on since patient remains confused

## 2023-04-04 ENCOUNTER — Ambulatory Visit: Payer: Medicare Other

## 2023-04-04 ENCOUNTER — Telehealth: Payer: Self-pay

## 2023-04-04 ENCOUNTER — Other Ambulatory Visit: Payer: Medicare Other

## 2023-04-04 DIAGNOSIS — S12601G Unspecified nondisplaced fracture of seventh cervical vertebra, subsequent encounter for fracture with delayed healing: Secondary | ICD-10-CM | POA: Diagnosis not present

## 2023-04-04 LAB — MAGNESIUM: Magnesium: 2.5 mg/dL — ABNORMAL HIGH (ref 1.7–2.4)

## 2023-04-04 LAB — CBC
HCT: 32.1 % — ABNORMAL LOW (ref 36.0–46.0)
Hemoglobin: 10.8 g/dL — ABNORMAL LOW (ref 12.0–15.0)
MCH: 29.5 pg (ref 26.0–34.0)
MCHC: 33.6 g/dL (ref 30.0–36.0)
MCV: 87.7 fL (ref 80.0–100.0)
Platelets: 229 10*3/uL (ref 150–400)
RBC: 3.66 MIL/uL — ABNORMAL LOW (ref 3.87–5.11)
RDW: 14.9 % (ref 11.5–15.5)
WBC: 13.5 10*3/uL — ABNORMAL HIGH (ref 4.0–10.5)
nRBC: 0 % (ref 0.0–0.2)

## 2023-04-04 LAB — RENAL FUNCTION PANEL
Albumin: 3 g/dL — ABNORMAL LOW (ref 3.5–5.0)
Anion gap: 9 (ref 5–15)
BUN: 18 mg/dL (ref 8–23)
CO2: 23 mmol/L (ref 22–32)
Calcium: 7.9 mg/dL — ABNORMAL LOW (ref 8.9–10.3)
Chloride: 108 mmol/L (ref 98–111)
Creatinine, Ser: 0.47 mg/dL (ref 0.44–1.00)
GFR, Estimated: >60 mL/min (ref >60)
Glucose, Bld: 130 mg/dL — ABNORMAL HIGH (ref 70–99)
Phosphorus: 2.2 mg/dL — ABNORMAL LOW (ref 2.5–4.6)
Potassium: 3.8 mmol/L (ref 3.5–5.1)
Sodium: 140 mmol/L (ref 135–145)

## 2023-04-04 MED ORDER — POTASSIUM & SODIUM PHOSPHATES 280-160-250 MG PO PACK: 2.0000 | PACK | ORAL | Status: DC

## 2023-04-04 MED ORDER — POTASSIUM & SODIUM PHOSPHATES 280-160-250 MG PO PACK
2.0000 | PACK | Freq: Once | ORAL | Status: DC
  Filled 2023-04-04: qty 2

## 2023-04-04 MED ORDER — DEXTROSE IN LACTATED RINGERS 5 % IV SOLN: INTRAVENOUS | Status: DC

## 2023-04-04 MED ORDER — LACTATED RINGERS IV SOLN: INTRAVENOUS | Status: DC
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
## 2023-04-04 NOTE — Progress Notes (Deleted)
 [facility] ADULT ICU REPLACEMENT PROTOCOL   The patient does apply for the [facility] Adult ICU Electrolyte Replacment Protocol based on the criteria listed below:   1.Exclusion criteria: TCTS, ECMO, Dialysis, and Myasthenia Gravis patients 2. Is GFR >/= 30 ml/min? Yes.    Patient's GFR today is >60 3. Is SCr </= 2? Yes.   Patient's SCr is 0.53 mg/dL 4. Did SCr increase >/= 0.5 in 24 hours? No. 5.Pt's weight >40kg  Yes.   6. Abnormal electrolyte(s): Phos, K  7. Electrolytes replaced per protocol 8.  Call MD STAT for K+ </= 2.5, Phos </= 1, or Mag </= 1 Physician:  Merryl [facility] 04/04/2023 5:33 AM

## 2023-04-04 NOTE — Progress Notes (Signed)
 Patient arrived to unit at 1324 in hospital bed in stable condition.  Patient alert, oriented to person, place and situation.  Patient oriented to room and unit and call light placed within reach.

## 2023-04-04 NOTE — Telephone Encounter (Signed)
 FYI

## 2023-04-04 NOTE — Discharge Instructions (Signed)
 Your surgeon has performed an operation on your cervical spine (neck) to relieve pressure on the spinal cord and/or nerves. This involved making an incision in the front of your neck and removing one or more of the discs that support your spine. Next, a small piece of bone, a titanium plate, and screws were used to fuse two or more of the vertebrae (bones) together.  The following are instructions to help in your recovery once you have been discharged from the hospital. Even if you feel well, it is important that you follow these activity guidelines. If you do not let your neck heal properly from the surgery, you can increase the chance of return of your symptoms and other complications.  * Do not take anti-inflammatory medications for 3 months after surgery (naproxen [Aleve], ibuprofen [Advil, Motrin], celecoxib [Celebrex], etc.). These medications can prevent your bones from healing properly.  Activity    No bending, lifting, or twisting ("BLT"). Avoid lifting objects heavier than 10 pounds (gallon milk jug).  Where possible, avoid household activities that involve lifting, bending, reaching, pushing, or pulling such as laundry, vacuuming, grocery shopping, and childcare. Try to arrange for help from friends and family for these activities while your back heals.  Increase physical activity slowly as tolerated.  Taking short walks is encouraged, but avoid strenuous exercise. Do not jog, run, bicycle, lift weights, or participate in any other exercises unless specifically allowed by your doctor.  Talk to your doctor before resuming sexual activity.  You should not drive until cleared by your doctor.  Until released by your doctor, you should not return to work or school.  You should rest at home and let your body heal.   You may shower three days after your surgery.  After showering, lightly dab your incision dry. Do not take a tub bath or go swimming until approved by your doctor at your  follow-up appointment.  If your doctor ordered a cervical collar (neck brace) for you, you should wear it whenever you are out of bed. You may remove it when lying down or sleeping, but you should wear it at all other times. Not all neck surgeries require a cervical collar.  If you smoke, we strongly recommend that you quit.  Smoking has been proven to interfere with normal bone healing and will dramatically reduce the success rate of your surgery. Please contact [facility] (800-QUIT-NOW) and use the resources at www.[facility].com for assistance in stopping smoking.  Surgical Incision   If you have a dressing on your incision, you may remove it two days after your surgery. Keep your incision area clean and dry.  If you have staples or stitches on your incision, you should have a follow up scheduled for removal. If you do not have staples or stitches, you will have steri-strips (small pieces of surgical tape) or Dermabond glue. The steri-strips/glue should begin to peel away within about a week (it is fine if the steri-strips fall off before then). If the strips are still in place one week after your surgery, you may gently remove them.  Diet           You may return to your usual diet. However, you may experience discomfort when swallowing in the first month after your surgery. This is normal. You may find that softer foods are more comfortable for you to swallow. Be sure to stay hydrated.  When to Contact us  You may experience pain in your neck and/or pain between your shoulder  blades. This is normal and should improve in the next few weeks with the help of pain medication, muscle relaxers, and rest. Some patients report that a warm compress on the back of the neck or between the shoulder blades helps.  However, should you experience any of the following, contact us immediately: New numbness or weakness Pain that is progressively getting worse, and is not relieved by your pain medication,  muscle relaxers, rest, and warm compresses Bleeding, redness, swelling, pain, or drainage from surgical incision Chills or flu-like symptoms Fever greater than 101.0 F (38.3 C) Inability to eat, drink fluids, or take medications Problems with bowel or bladder functions Difficulty breathing or shortness of breath Warmth, tenderness, or swelling in your calf Contact Information How to contact us:  If you have any questions/concerns before or after surgery, you can reach [place] at [phone], or you can send a mychart message. We can be reached by phone or mychart 8am-4pm, Monday-Friday.  *Please note: Calls after 4pm are forwarded to a third party answering service. Mychart messages are not routinely monitored during evenings, weekends, and holidays. Please call our office to contact the answering service for urgent concerns during non-business hours.
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
## 2023-04-05 DIAGNOSIS — S12601G Unspecified nondisplaced fracture of seventh cervical vertebra, subsequent encounter for fracture with delayed healing: Secondary | ICD-10-CM | POA: Diagnosis not present

## 2023-04-05 LAB — CBC
HCT: 32.4 % — ABNORMAL LOW (ref 36.0–46.0)
Hemoglobin: 10.5 g/dL — ABNORMAL LOW (ref 12.0–15.0)
MCH: 29.1 pg (ref 26.0–34.0)
MCHC: 32.4 g/dL (ref 30.0–36.0)
MCV: 89.8 fL (ref 80.0–100.0)
Platelets: 246 10*3/uL (ref 150–400)
RBC: 3.61 MIL/uL — ABNORMAL LOW (ref 3.87–5.11)
RDW: 14.8 % (ref 11.5–15.5)
WBC: 12.9 10*3/uL — ABNORMAL HIGH (ref 4.0–10.5)
nRBC: 0 % (ref 0.0–0.2)

## 2023-04-05 LAB — RENAL FUNCTION PANEL
Albumin: 3.1 g/dL — ABNORMAL LOW (ref 3.5–5.0)
Anion gap: 12 (ref 5–15)
BUN: 21 mg/dL (ref 8–23)
CO2: 23 mmol/L (ref 22–32)
Calcium: 8 mg/dL — ABNORMAL LOW (ref 8.9–10.3)
Chloride: 107 mmol/L (ref 98–111)
Creatinine, Ser: 0.54 mg/dL (ref 0.44–1.00)
GFR, Estimated: >60 mL/min (ref >60)
Glucose, Bld: 111 mg/dL — ABNORMAL HIGH (ref 70–99)
Phosphorus: 2.1 mg/dL — ABNORMAL LOW (ref 2.5–4.6)
Potassium: 3.6 mmol/L (ref 3.5–5.1)
Sodium: 142 mmol/L (ref 135–145)

## 2023-04-05 LAB — MAGNESIUM: Magnesium: 2.7 mg/dL — ABNORMAL HIGH (ref 1.7–2.4)

## 2023-04-05 MED ORDER — POTASSIUM & SODIUM PHOSPHATES 280-160-250 MG PO PACK
2.0000 | PACK | Freq: Once | ORAL | Status: AC
  Administered 2023-04-05: 2 via ORAL
  Filled 2023-04-05: qty 2

## 2023-04-05 NOTE — Plan of Care (Signed)
  Problem: Education: Goal: Knowledge of General Education information will improve Description: Including pain rating scale, medication(s)/side effects and non-pharmacologic comfort measures Outcome: Progressing   Problem: Clinical Measurements: Goal: Will remain free from infection Outcome: Progressing   Problem: Activity: Goal: Risk for activity intolerance will decrease Outcome: Progressing   Problem: Nutrition: Goal: Adequate nutrition will be maintained Outcome: Progressing   Problem: Coping: Goal: Level of anxiety will decrease Outcome: Progressing   Problem: Elimination: Goal: Will not experience complications related to urinary retention Outcome: Progressing   Problem: Pain Managment: Goal: General experience of comfort will improve and/or be controlled Outcome: Progressing   Problem: Safety: Goal: Ability to remain free from injury will improve Outcome: Progressing   Problem: Skin Integrity: Goal: Risk for impaired skin integrity will decrease Outcome: Progressing
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
## 2023-04-05 NOTE — Progress Notes (Signed)
 Inpatient Rehab Admissions:  Inpatient Rehab Consult received.  Attempted to contact pt but no one answered the phone. I spoke with patient's daughter [name]  on the telephone for rehabilitation assessment and to discuss goals and expectations of an inpatient rehab admission.  Discussed average length of stay and discharge home after completion of CIR. She acknowledged understanding. She is going to discuss rehab options with her brother prior to making a decision regarding CIR. Will continue to follow.  Signed: Wolfgang Phoenix, MS, CCC-SLP Admissions Coordinator [phone]

## 2023-04-05 NOTE — TOC Progression Note (Signed)
 Transition of Care [facility]) - Progression Note    Patient Details  Name: Rita Taylor MRN: [id number] Date of Birth: Jan 07, 1941  Transition of [facility]) CM/SW Contact  Allena Katz, LCSW Phone Number: 04/05/2023, 11:24 AM  Clinical Narrative:  CIR evaluating. TOC following.         Expected Discharge Plan and Services                                               Social Determinants of Health (SDOH) Interventions SDOH Screenings   Food Insecurity: No Food Insecurity (03/31/2023)  Housing: Low Risk  (03/31/2023)  Transportation Needs: No Transportation Needs (03/31/2023)  Utilities: Not At Risk (03/31/2023)  Depression (PHQ2-9): Low Risk  (11/29/2022)  Financial Resource Strain: Low Risk  (06/03/2022)  Physical Activity: Unknown (06/03/2022)  Social Connections: Unknown (04/01/2023)  Stress: No Stress Concern Present (06/03/2022)  Tobacco Use: Low Risk  (03/30/2023)    Readmission Risk Interventions     No data to display

## 2023-04-05 NOTE — Progress Notes (Signed)

## 2023-04-05 NOTE — Care Management Important Message (Signed)
 Important Message  Patient Details  Name: Rita Taylor MRN: [id number] Date of Birth: Jul 07, 1940   Important Message Given:  Yes - Medicare IM     Cristela Blue, CMA 04/05/2023, 10:28 AM

## 2023-04-05 NOTE — Progress Notes (Signed)
 PT Cancellation Note  Patient Details Name: Rita Taylor MRN: [id number] DOB: 08-10-1940   Cancelled Treatment:     PT attempt. SLP in room working with pt. Author will return later and continue to follow per current POC.    Rushie Chestnut 04/05/2023, 10:14 AM
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
## 2023-04-06 ENCOUNTER — Encounter: Payer: Self-pay | Admitting: Neurosurgery

## 2023-04-06 DIAGNOSIS — S129XXA Fracture of neck, unspecified, initial encounter: Secondary | ICD-10-CM | POA: Diagnosis not present

## 2023-04-06 DIAGNOSIS — S2243XA Multiple fractures of ribs, bilateral, initial encounter for closed fracture: Secondary | ICD-10-CM

## 2023-04-06 DIAGNOSIS — S2222XA Fracture of body of sternum, initial encounter for closed fracture: Secondary | ICD-10-CM

## 2023-04-06 DIAGNOSIS — S12601G Unspecified nondisplaced fracture of seventh cervical vertebra, subsequent encounter for fracture with delayed healing: Secondary | ICD-10-CM | POA: Diagnosis not present

## 2023-04-06 LAB — RENAL FUNCTION PANEL
Albumin: 2.9 g/dL — ABNORMAL LOW (ref 3.5–5.0)
Anion gap: 12 (ref 5–15)
BUN: 18 mg/dL (ref 8–23)
CO2: 23 mmol/L (ref 22–32)
Calcium: 8.1 mg/dL — ABNORMAL LOW (ref 8.9–10.3)
Chloride: 106 mmol/L (ref 98–111)
Creatinine, Ser: 0.44 mg/dL (ref 0.44–1.00)
GFR, Estimated: >60 mL/min (ref >60)
Glucose, Bld: 108 mg/dL — ABNORMAL HIGH (ref 70–99)
Phosphorus: 2.2 mg/dL — ABNORMAL LOW (ref 2.5–4.6)
Potassium: 3.1 mmol/L — ABNORMAL LOW (ref 3.5–5.1)
Sodium: 141 mmol/L (ref 135–145)

## 2023-04-06 MED ORDER — POTASSIUM CHLORIDE CRYS ER 10 MEQ PO TBCR
20.0000 meq | EXTENDED_RELEASE_TABLET | ORAL | Status: AC
Start: 2023-04-06 — End: 2023-04-06
  Administered 2023-04-06 (×2): 20 meq via ORAL
  Filled 2023-04-06 (×2): qty 2

## 2023-04-06 MED ORDER — K PHOS MONO-SOD PHOS DI & MONO 155-852-130 MG PO TABS
500.0000 mg | ORAL_TABLET | ORAL | Status: AC
  Administered 2023-04-06 (×2): 500 mg via ORAL
  Filled 2023-04-06 (×2): qty 2

## 2023-04-06 NOTE — Plan of Care (Signed)
  Problem: Pain Managment: Goal: General experience of comfort will improve and/or be controlled Outcome: Progressing   Problem: Safety: Goal: Ability to remain free from injury will improve Outcome: Progressing   Problem: Skin Integrity: Goal: Risk for impaired skin integrity will decrease Outcome: Progressing   Problem: Activity: Goal: Ability to tolerate increased activity will improve Outcome: Progressing

## 2023-04-06 NOTE — TOC Progression Note (Signed)
 Transition of Care [facility]) - Progression Note    Patient Details  Name: SINDIA KOWALCZYK MRN: [id number] Date of Birth: Jun 01, 1940  Transition of Care [facility]) CM/SW Contact  Allena Katz, LCSW Phone Number: 04/06/2023, 1:36 PM  Clinical Narrative:    CSW spoke with doug [phone] who states his dad is discharging home and he is trying to piece together 24/7 care so his mom can go to CIR.        Expected Discharge Plan and Services                                               Social Determinants of Health (SDOH) Interventions SDOH Screenings   Food Insecurity: No Food Insecurity (03/31/2023)  Housing: Low Risk  (03/31/2023)  Transportation Needs: No Transportation Needs (03/31/2023)  Utilities: Not At Risk (03/31/2023)  Depression (PHQ2-9): Low Risk  (11/29/2022)  Financial Resource Strain: Low Risk  (06/03/2022)  Physical Activity: Unknown (06/03/2022)  Social Connections: Unknown (04/01/2023)  Stress: No Stress Concern Present (06/03/2022)  Tobacco Use: Low Risk  (03/30/2023)    Readmission Risk Interventions     No data to display
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
## 2023-04-06 NOTE — Plan of Care (Signed)
  Problem: Activity: Goal: Ability to tolerate increased activity will improve Outcome: Progressing   Problem: Pain Management: Goal: Pain level will decrease Outcome: Progressing   Problem: Skin Integrity: Goal: Will show signs of wound healing Outcome: Progressing
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
## 2023-04-06 NOTE — Plan of Care (Signed)

## 2023-04-06 NOTE — Progress Notes (Signed)
  Inpatient Rehabilitation Admissions Coordinator   I contacted patient's daughter, [name], by phone to follow up with yesterdays' conversation with Leotis Shames, my [facility] partner. [name] needs to clarify with her brother if they would like placement of their parents together or separate. Also for a possible CIR admit, I would need clarification that they could provide 24/7 caregiver supports that she will need even after CIR. [name] to further clarify with family and let me know.  Ottie Glazier, RN, MSN Rehab Admissions Coordinator [phone] 04/06/2023 11:28 AM

## 2023-04-07 DIAGNOSIS — S12601G Unspecified nondisplaced fracture of seventh cervical vertebra, subsequent encounter for fracture with delayed healing: Secondary | ICD-10-CM | POA: Diagnosis not present

## 2023-04-07 DIAGNOSIS — S2243XA Multiple fractures of ribs, bilateral, initial encounter for closed fracture: Secondary | ICD-10-CM | POA: Diagnosis not present

## 2023-04-07 DIAGNOSIS — S129XXA Fracture of neck, unspecified, initial encounter: Secondary | ICD-10-CM | POA: Diagnosis not present

## 2023-04-07 DIAGNOSIS — S2222XA Fracture of body of sternum, initial encounter for closed fracture: Secondary | ICD-10-CM | POA: Diagnosis not present

## 2023-04-07 LAB — RENAL FUNCTION PANEL
Albumin: 3 g/dL — ABNORMAL LOW (ref 3.5–5.0)
Anion gap: 11 (ref 5–15)
BUN: 17 mg/dL (ref 8–23)
CO2: 24 mmol/L (ref 22–32)
Calcium: 7.9 mg/dL — ABNORMAL LOW (ref 8.9–10.3)
Chloride: 105 mmol/L (ref 98–111)
Creatinine, Ser: 0.52 mg/dL (ref 0.44–1.00)
GFR, Estimated: >60 mL/min (ref >60)
Glucose, Bld: 115 mg/dL — ABNORMAL HIGH (ref 70–99)
Phosphorus: 3.2 mg/dL (ref 2.5–4.6)
Potassium: 3.3 mmol/L — ABNORMAL LOW (ref 3.5–5.1)
Sodium: 140 mmol/L (ref 135–145)

## 2023-04-07 LAB — GLUCOSE, CAPILLARY: Glucose-Capillary: 112 mg/dL — ABNORMAL HIGH (ref 70–99)

## 2023-04-07 MED ORDER — ADULT MULTIVITAMIN W/MINERALS CH
1.0000 | ORAL_TABLET | Freq: Every day | ORAL | Status: DC
  Administered 2023-04-07 – 2023-04-08 (×2): 1 via ORAL
  Filled 2023-04-07 (×2): qty 1

## 2023-04-07 MED ORDER — ENSURE ENLIVE PO LIQD
237.0000 mL | Freq: Three times a day (TID) | ORAL | Status: DC
  Administered 2023-04-07 – 2023-04-08 (×2): 237 mL via ORAL

## 2023-04-07 MED ORDER — POTASSIUM CHLORIDE CRYS ER 10 MEQ PO TBCR
40.0000 meq | EXTENDED_RELEASE_TABLET | Freq: Once | ORAL | Status: AC
  Administered 2023-04-07: 40 meq via ORAL
  Filled 2023-04-07: qty 4
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
## 2023-04-07 NOTE — Plan of Care (Signed)
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
## 2023-04-07 NOTE — TOC Progression Note (Signed)
 Transition of Care [facility]) - Progression Note    Patient Details  Name: Rita Taylor MRN: [id number] Date of Birth: May 23, 1940  Transition of Care [facility]) CM/SW Contact  Allena Katz, LCSW Phone Number: 04/07/2023, 3:22 PM  Clinical Narrative:     CSW spoke with son who states that they have decided to bring the patient home with [facility]. They want to use amedysis. Referral accepted by cheryl.      Expected Discharge Plan and Services                                               Social Determinants of Health (SDOH) Interventions SDOH Screenings   Food Insecurity: No Food Insecurity (03/31/2023)  Housing: Low Risk  (03/31/2023)  Transportation Needs: No Transportation Needs (03/31/2023)  Utilities: Not At Risk (03/31/2023)  Depression (PHQ2-9): Low Risk  (11/29/2022)  Financial Resource Strain: Low Risk  (06/03/2022)  Physical Activity: Unknown (06/03/2022)  Social Connections: Unknown (04/06/2023)  Stress: No Stress Concern Present (06/03/2022)  Tobacco Use: Low Risk  (03/30/2023)    Readmission Risk Interventions     No data to display

## 2023-04-08 ENCOUNTER — Encounter (HOSPITAL_COMMUNITY): Payer: Self-pay | Admitting: Physical Medicine and Rehabilitation

## 2023-04-08 ENCOUNTER — Inpatient Hospital Stay (HOSPITAL_COMMUNITY)
Admission: AD | Admit: 2023-04-08 | Discharge: 2023-04-13 | DRG: 559 | Disposition: A | Discharge status: Other Acute Inpatient Hospital | Payer: Medicare Other | Source: Other Acute Inpatient Hospital | Attending: Physical Medicine and Rehabilitation | Admitting: Physical Medicine and Rehabilitation

## 2023-04-08 DIAGNOSIS — I951 Orthostatic hypotension: Secondary | ICD-10-CM | POA: Diagnosis not present

## 2023-04-08 DIAGNOSIS — Z85820 Personal history of malignant melanoma of skin: Secondary | ICD-10-CM

## 2023-04-08 DIAGNOSIS — S2243XD Multiple fractures of ribs, bilateral, subsequent encounter for fracture with routine healing: Secondary | ICD-10-CM

## 2023-04-08 DIAGNOSIS — E785 Hyperlipidemia, unspecified: Secondary | ICD-10-CM | POA: Diagnosis present

## 2023-04-08 DIAGNOSIS — S129XXD Fracture of neck, unspecified, subsequent encounter: Secondary | ICD-10-CM | POA: Diagnosis not present

## 2023-04-08 DIAGNOSIS — S233XXD Sprain of ligaments of thoracic spine, subsequent encounter: Secondary | ICD-10-CM | POA: Diagnosis not present

## 2023-04-08 DIAGNOSIS — R32 Unspecified urinary incontinence: Secondary | ICD-10-CM | POA: Diagnosis not present

## 2023-04-08 DIAGNOSIS — D75838 Other thrombocytosis: Secondary | ICD-10-CM | POA: Diagnosis present

## 2023-04-08 DIAGNOSIS — D62 Acute posthemorrhagic anemia: Secondary | ICD-10-CM | POA: Diagnosis present

## 2023-04-08 DIAGNOSIS — S2249XD Multiple fractures of ribs, unspecified side, subsequent encounter for fracture with routine healing: Secondary | ICD-10-CM | POA: Diagnosis not present

## 2023-04-08 DIAGNOSIS — T1490XA Injury, unspecified, initial encounter: Principal | ICD-10-CM | POA: Insufficient documentation

## 2023-04-08 DIAGNOSIS — S134XXD Sprain of ligaments of cervical spine, subsequent encounter: Secondary | ICD-10-CM

## 2023-04-08 DIAGNOSIS — R7303 Prediabetes: Secondary | ICD-10-CM | POA: Diagnosis not present

## 2023-04-08 DIAGNOSIS — E871 Hypo-osmolality and hyponatremia: Secondary | ICD-10-CM | POA: Diagnosis not present

## 2023-04-08 DIAGNOSIS — M21371 Foot drop, right foot: Secondary | ICD-10-CM | POA: Diagnosis present

## 2023-04-08 DIAGNOSIS — Z8249 Family history of ischemic heart disease and other diseases of the circulatory system: Secondary | ICD-10-CM | POA: Diagnosis not present

## 2023-04-08 DIAGNOSIS — S22069D Unspecified fracture of T7-T8 vertebra, subsequent encounter for fracture with routine healing: Secondary | ICD-10-CM | POA: Diagnosis not present

## 2023-04-08 DIAGNOSIS — S12500D Unspecified displaced fracture of sixth cervical vertebra, subsequent encounter for fracture with routine healing: Principal | ICD-10-CM

## 2023-04-08 DIAGNOSIS — Z7983 Long term (current) use of bisphosphonates: Secondary | ICD-10-CM

## 2023-04-08 DIAGNOSIS — S12591D Other nondisplaced fracture of sixth cervical vertebra, subsequent encounter for fracture with routine healing: Secondary | ICD-10-CM | POA: Diagnosis not present

## 2023-04-08 DIAGNOSIS — R410 Disorientation, unspecified: Secondary | ICD-10-CM | POA: Diagnosis not present

## 2023-04-08 DIAGNOSIS — S22068D Other fracture of T7-T8 thoracic vertebra, subsequent encounter for fracture with routine healing: Secondary | ICD-10-CM

## 2023-04-08 DIAGNOSIS — G919 Hydrocephalus, unspecified: Secondary | ICD-10-CM | POA: Diagnosis not present

## 2023-04-08 DIAGNOSIS — Y92410 Unspecified street and highway as the place of occurrence of the external cause: Secondary | ICD-10-CM | POA: Diagnosis not present

## 2023-04-08 DIAGNOSIS — M50222 Other cervical disc displacement at C5-C6 level: Secondary | ICD-10-CM | POA: Diagnosis present

## 2023-04-08 DIAGNOSIS — I10 Essential (primary) hypertension: Secondary | ICD-10-CM | POA: Diagnosis present

## 2023-04-08 DIAGNOSIS — R4182 Altered mental status, unspecified: Secondary | ICD-10-CM | POA: Insufficient documentation

## 2023-04-08 DIAGNOSIS — G935 Compression of brain: Secondary | ICD-10-CM | POA: Diagnosis present

## 2023-04-08 DIAGNOSIS — Q8502 Neurofibromatosis, type 2: Secondary | ICD-10-CM | POA: Diagnosis not present

## 2023-04-08 DIAGNOSIS — Z79899 Other long term (current) drug therapy: Secondary | ICD-10-CM

## 2023-04-08 DIAGNOSIS — G911 Obstructive hydrocephalus: Secondary | ICD-10-CM | POA: Diagnosis not present

## 2023-04-08 DIAGNOSIS — R29810 Facial weakness: Secondary | ICD-10-CM | POA: Diagnosis present

## 2023-04-08 DIAGNOSIS — S2220XD Unspecified fracture of sternum, subsequent encounter for fracture with routine healing: Secondary | ICD-10-CM | POA: Diagnosis not present

## 2023-04-08 DIAGNOSIS — R1310 Dysphagia, unspecified: Secondary | ICD-10-CM | POA: Diagnosis not present

## 2023-04-08 DIAGNOSIS — Z888 Allergy status to other drugs, medicaments and biological substances status: Secondary | ICD-10-CM

## 2023-04-08 DIAGNOSIS — E119 Type 2 diabetes mellitus without complications: Secondary | ICD-10-CM | POA: Diagnosis not present

## 2023-04-08 DIAGNOSIS — S129XXA Fracture of neck, unspecified, initial encounter: Secondary | ICD-10-CM | POA: Diagnosis present

## 2023-04-08 DIAGNOSIS — Z7982 Long term (current) use of aspirin: Secondary | ICD-10-CM

## 2023-04-08 DIAGNOSIS — R159 Full incontinence of feces: Secondary | ICD-10-CM | POA: Diagnosis not present

## 2023-04-08 DIAGNOSIS — D333 Benign neoplasm of cranial nerves: Secondary | ICD-10-CM | POA: Diagnosis present

## 2023-04-08 DIAGNOSIS — Z8619 Personal history of other infectious and parasitic diseases: Secondary | ICD-10-CM

## 2023-04-08 DIAGNOSIS — Z981 Arthrodesis status: Secondary | ICD-10-CM

## 2023-04-08 DIAGNOSIS — D72829 Elevated white blood cell count, unspecified: Secondary | ICD-10-CM | POA: Diagnosis not present

## 2023-04-08 DIAGNOSIS — S129XXS Fracture of neck, unspecified, sequela: Secondary | ICD-10-CM | POA: Diagnosis present

## 2023-04-08 DIAGNOSIS — Z7409 Other reduced mobility: Secondary | ICD-10-CM | POA: Diagnosis present

## 2023-04-08 DIAGNOSIS — E43 Unspecified severe protein-calorie malnutrition: Secondary | ICD-10-CM | POA: Diagnosis not present

## 2023-04-08 DIAGNOSIS — F32A Depression, unspecified: Secondary | ICD-10-CM | POA: Diagnosis not present

## 2023-04-08 DIAGNOSIS — Z681 Body mass index (BMI) 19 or less, adult: Secondary | ICD-10-CM | POA: Diagnosis not present

## 2023-04-08 DIAGNOSIS — S061XAA Traumatic cerebral edema with loss of consciousness status unknown, initial encounter: Secondary | ICD-10-CM | POA: Diagnosis not present

## 2023-04-08 DIAGNOSIS — Z515 Encounter for palliative care: Secondary | ICD-10-CM | POA: Diagnosis not present

## 2023-04-08 DIAGNOSIS — H5450 Low vision, one eye, unspecified eye: Secondary | ICD-10-CM | POA: Diagnosis present

## 2023-04-08 DIAGNOSIS — R5383 Other fatigue: Secondary | ICD-10-CM | POA: Diagnosis present

## 2023-04-08 DIAGNOSIS — G918 Other hydrocephalus: Secondary | ICD-10-CM | POA: Diagnosis not present

## 2023-04-08 DIAGNOSIS — R638 Other symptoms and signs concerning food and fluid intake: Secondary | ICD-10-CM | POA: Insufficient documentation

## 2023-04-08 DIAGNOSIS — M546 Pain in thoracic spine: Secondary | ICD-10-CM | POA: Diagnosis not present

## 2023-04-08 DIAGNOSIS — S61412D Laceration without foreign body of left hand, subsequent encounter: Secondary | ICD-10-CM

## 2023-04-08 DIAGNOSIS — M21372 Foot drop, left foot: Secondary | ICD-10-CM | POA: Diagnosis not present

## 2023-04-08 DIAGNOSIS — E876 Hypokalemia: Secondary | ICD-10-CM | POA: Diagnosis not present

## 2023-04-08 DIAGNOSIS — R197 Diarrhea, unspecified: Secondary | ICD-10-CM

## 2023-04-08 DIAGNOSIS — G9389 Other specified disorders of brain: Secondary | ICD-10-CM | POA: Diagnosis not present

## 2023-04-08 DIAGNOSIS — Z818 Family history of other mental and behavioral disorders: Secondary | ICD-10-CM

## 2023-04-08 DIAGNOSIS — Z4802 Encounter for removal of sutures: Secondary | ICD-10-CM

## 2023-04-08 DIAGNOSIS — G936 Cerebral edema: Secondary | ICD-10-CM | POA: Diagnosis not present

## 2023-04-08 DIAGNOSIS — R2681 Unsteadiness on feet: Secondary | ICD-10-CM | POA: Diagnosis not present

## 2023-04-08 DIAGNOSIS — S22031D Stable burst fracture of third thoracic vertebra, subsequent encounter for fracture with routine healing: Secondary | ICD-10-CM | POA: Diagnosis not present

## 2023-04-08 DIAGNOSIS — R4189 Other symptoms and signs involving cognitive functions and awareness: Secondary | ICD-10-CM | POA: Insufficient documentation

## 2023-04-08 DIAGNOSIS — S069X0A Unspecified intracranial injury without loss of consciousness, initial encounter: Secondary | ICD-10-CM | POA: Diagnosis not present

## 2023-04-08 DIAGNOSIS — S12601G Unspecified nondisplaced fracture of seventh cervical vertebra, subsequent encounter for fracture with delayed healing: Secondary | ICD-10-CM | POA: Diagnosis not present

## 2023-04-08 DIAGNOSIS — Z83438 Family history of other disorder of lipoprotein metabolism and other lipidemia: Secondary | ICD-10-CM | POA: Diagnosis not present

## 2023-04-08 DIAGNOSIS — R000 Tachycardia, unspecified: Secondary | ICD-10-CM | POA: Diagnosis not present

## 2023-04-08 DIAGNOSIS — R739 Hyperglycemia, unspecified: Secondary | ICD-10-CM | POA: Diagnosis not present

## 2023-04-08 DIAGNOSIS — H9192 Unspecified hearing loss, left ear: Secondary | ICD-10-CM | POA: Diagnosis present

## 2023-04-08 LAB — RENAL FUNCTION PANEL
Albumin: 3 g/dL — ABNORMAL LOW (ref 3.5–5.0)
Anion gap: 11 (ref 5–15)
BUN: 21 mg/dL (ref 8–23)
CO2: 23 mmol/L (ref 22–32)
Calcium: 8.3 mg/dL — ABNORMAL LOW (ref 8.9–10.3)
Chloride: 106 mmol/L (ref 98–111)
Creatinine, Ser: 0.46 mg/dL (ref 0.44–1.00)
GFR, Estimated: >60 mL/min (ref >60)
Glucose, Bld: 126 mg/dL — ABNORMAL HIGH (ref 70–99)
Phosphorus: 2.8 mg/dL (ref 2.5–4.6)
Potassium: 3.8 mmol/L (ref 3.5–5.1)
Sodium: 140 mmol/L (ref 135–145)

## 2023-04-08 MED ORDER — OXYCODONE HCL 5 MG PO TABS
5.0000 mg | ORAL_TABLET | ORAL | Status: DC | PRN
  Administered 2023-04-11: 5 mg via ORAL
  Filled 2023-04-08: qty 1

## 2023-04-08 MED ORDER — METHOCARBAMOL 500 MG PO TABS
500.0000 mg | ORAL_TABLET | Freq: Four times a day (QID) | ORAL | Status: DC | PRN
  Administered 2023-04-10 – 2023-04-11 (×2): 500 mg via ORAL
  Filled 2023-04-08 (×2): qty 1

## 2023-04-08 MED ORDER — PRAVASTATIN SODIUM 40 MG PO TABS
40.0000 mg | ORAL_TABLET | Freq: Every day | ORAL | Status: DC
  Administered 2023-04-08 – 2023-04-12 (×5): 40 mg via ORAL
  Filled 2023-04-08 (×5): qty 1

## 2023-04-08 MED ORDER — ENSURE ENLIVE PO LIQD: 237.0000 mL | Freq: Three times a day (TID) | ORAL | 12 refills | Status: DC

## 2023-04-08 MED ORDER — PRAVASTATIN SODIUM 20 MG PO TABS: 40.0000 mg | ORAL_TABLET | Freq: Every day | ORAL | Status: DC

## 2023-04-08 MED ORDER — ENOXAPARIN SODIUM 40 MG/0.4ML IJ SOSY
40.0000 mg | PREFILLED_SYRINGE | INTRAMUSCULAR | Status: DC
  Administered 2023-04-09 – 2023-04-13 (×5): 40 mg via SUBCUTANEOUS
  Filled 2023-04-08 (×5): qty 0.4

## 2023-04-08 MED ORDER — TRAZODONE HCL 50 MG PO TABS: 25.0000 mg | ORAL_TABLET | Freq: Every evening | ORAL | Status: DC | PRN

## 2023-04-08 MED ORDER — SORBITOL 70 % SOLN: 30.0000 mL | Freq: Every day | Status: DC | PRN

## 2023-04-08 MED ORDER — VITAMIN B-12 1000 MCG PO TABS
1000.0000 ug | ORAL_TABLET | Freq: Every day | ORAL | Status: DC
  Administered 2023-04-08: 1000 ug via ORAL
  Filled 2023-04-08: qty 1

## 2023-04-08 MED ORDER — ADULT MULTIVITAMIN W/MINERALS CH
1.0000 | ORAL_TABLET | Freq: Every day | ORAL | Status: DC
  Administered 2023-04-09 – 2023-04-13 (×5): 1 via ORAL
  Filled 2023-04-08 (×5): qty 1

## 2023-04-08 MED ORDER — ENSURE ENLIVE PO LIQD
237.0000 mL | Freq: Three times a day (TID) | ORAL | Status: DC
  Administered 2023-04-08 – 2023-04-13 (×11): 237 mL via ORAL

## 2023-04-08 MED ORDER — LIDOCAINE 5 % EX PTCH
2.0000 | MEDICATED_PATCH | CUTANEOUS | Status: DC
  Administered 2023-04-09 – 2023-04-12 (×4): 2 via TRANSDERMAL
  Filled 2023-04-08 (×5): qty 2

## 2023-04-08 MED ORDER — ASPIRIN 81 MG PO TBEC
81.0000 mg | DELAYED_RELEASE_TABLET | Freq: Every day | ORAL | Status: DC
  Administered 2023-04-08: 81 mg via ORAL
  Filled 2023-04-08: qty 1

## 2023-04-08 MED ORDER — ALUM & MAG HYDROXIDE-SIMETH 200-200-20 MG/5ML PO SUSP: 30.0000 mL | ORAL | Status: DC | PRN

## 2023-04-08 MED ORDER — BUPROPION HCL ER (XL) 150 MG PO TB24
150.0000 mg | ORAL_TABLET | Freq: Every day | ORAL | Status: DC
  Administered 2023-04-09 – 2023-04-13 (×5): 150 mg via ORAL
  Filled 2023-04-08 (×6): qty 1

## 2023-04-08 MED ORDER — GABAPENTIN 300 MG PO CAPS
300.0000 mg | ORAL_CAPSULE | Freq: Three times a day (TID) | ORAL | Status: DC
  Administered 2023-04-08 – 2023-04-13 (×20): 300 mg via ORAL
  Filled 2023-04-08 (×21): qty 1

## 2023-04-08 MED ORDER — LOSARTAN POTASSIUM 50 MG PO TABS
100.0000 mg | ORAL_TABLET | Freq: Every day | ORAL | Status: DC
  Administered 2023-04-09 – 2023-04-13 (×5): 100 mg via ORAL
  Filled 2023-04-08 (×5): qty 2

## 2023-04-08 MED ORDER — ACETAMINOPHEN 325 MG PO TABS: 975.0000 mg | ORAL_TABLET | Freq: Three times a day (TID) | ORAL | Status: DC

## 2023-04-08 MED ORDER — PROCHLORPERAZINE 25 MG RE SUPP: 12.5000 mg | Freq: Four times a day (QID) | RECTAL | Status: DC | PRN

## 2023-04-08 MED ORDER — ACETAMINOPHEN 325 MG PO TABS
325.0000 mg | ORAL_TABLET | ORAL | Status: DC | PRN
  Filled 2023-04-08: qty 2

## 2023-04-08 MED ORDER — POLYETHYLENE GLYCOL 3350 17 G PO PACK: 17.0000 g | PACK | Freq: Every day | ORAL | Status: DC | PRN

## 2023-04-08 MED ORDER — PHENOL 1.4 % MT LIQD
1.0000 | OROMUCOSAL | Status: DC | PRN
Start: 2023-04-08 — End: 2023-04-13

## 2023-04-08 MED ORDER — BUPROPION HCL ER (XL) 150 MG PO TB24
150.0000 mg | ORAL_TABLET | Freq: Every day | ORAL | Status: DC
  Filled 2023-04-08: qty 1

## 2023-04-08 MED ORDER — VITAMIN B-12 1000 MCG PO TABS
1000.0000 ug | ORAL_TABLET | Freq: Every day | ORAL | Status: DC
  Administered 2023-04-09 – 2023-04-13 (×5): 1000 ug via ORAL
  Filled 2023-04-08 (×5): qty 1

## 2023-04-08 MED ORDER — DIPHENHYDRAMINE HCL 25 MG PO CAPS: 25.0000 mg | ORAL_CAPSULE | Freq: Four times a day (QID) | ORAL | Status: DC | PRN

## 2023-04-08 MED ORDER — ACETAMINOPHEN 325 MG PO TABS
650.0000 mg | ORAL_TABLET | Freq: Three times a day (TID) | ORAL | Status: DC
  Administered 2023-04-08 – 2023-04-13 (×15): 650 mg via ORAL
  Filled 2023-04-08 (×15): qty 2

## 2023-04-08 MED ORDER — AMLODIPINE BESYLATE 10 MG PO TABS
10.0000 mg | ORAL_TABLET | Freq: Every day | ORAL | Status: DC
  Administered 2023-04-09 – 2023-04-10 (×2): 10 mg via ORAL
  Filled 2023-04-08 (×3): qty 1

## 2023-04-08 MED ORDER — DOCUSATE SODIUM 100 MG PO CAPS
100.0000 mg | ORAL_CAPSULE | Freq: Two times a day (BID) | ORAL | Status: DC
  Administered 2023-04-08 – 2023-04-12 (×8): 100 mg via ORAL
  Filled 2023-04-08 (×9): qty 1

## 2023-04-08 MED ORDER — CHLORHEXIDINE GLUCONATE CLOTH 2 % EX PADS: 6.0000 | MEDICATED_PAD | Freq: Every day | CUTANEOUS | Status: DC

## 2023-04-08 MED ORDER — MENTHOL 3 MG MT LOZG
1.0000 | LOZENGE | OROMUCOSAL | Status: DC | PRN
Start: 2023-04-08 — End: 2023-04-13

## 2023-04-08 MED ORDER — SENNA 8.6 MG PO TABS: 1.0000 | ORAL_TABLET | Freq: Two times a day (BID) | ORAL | Status: DC

## 2023-04-08 MED ORDER — PROCHLORPERAZINE EDISYLATE 10 MG/2ML IJ SOLN: 5.0000 mg | Freq: Four times a day (QID) | INTRAMUSCULAR | Status: DC | PRN

## 2023-04-08 MED ORDER — OYSTER SHELL CALCIUM/D3 500-5 MG-MCG PO TABS
2.0000 | ORAL_TABLET | Freq: Every day | ORAL | Status: DC
  Administered 2023-04-09 – 2023-04-13 (×5): 2 via ORAL
  Filled 2023-04-08 (×6): qty 2

## 2023-04-08 MED ORDER — ASPIRIN 81 MG PO TBEC
81.0000 mg | DELAYED_RELEASE_TABLET | Freq: Every day | ORAL | Status: DC
  Administered 2023-04-09 – 2023-04-13 (×5): 81 mg via ORAL
  Filled 2023-04-08 (×5): qty 1

## 2023-04-08 MED ORDER — FLEET ENEMA RE ENEM: 1.0000 | ENEMA | Freq: Once | RECTAL | Status: DC | PRN

## 2023-04-08 MED ORDER — PROCHLORPERAZINE MALEATE 5 MG PO TABS: 5.0000 mg | ORAL_TABLET | Freq: Four times a day (QID) | ORAL | Status: DC | PRN

## 2023-04-08 MED ORDER — OXYCODONE HCL 5 MG PO TABS: 5.0000 mg | ORAL_TABLET | Freq: Three times a day (TID) | ORAL | 0 refills | Status: DC | PRN

## 2023-04-08 MED ORDER — POLYETHYLENE GLYCOL 3350 17 G PO PACK: 17.0000 g | PACK | Freq: Every day | ORAL | 0 refills | Status: DC | PRN

## 2023-04-08 MED ORDER — OYSTER SHELL CALCIUM/D3 500-5 MG-MCG PO TABS
2.0000 | ORAL_TABLET | Freq: Every day | ORAL | Status: DC
  Administered 2023-04-08: 2 via ORAL
  Filled 2023-04-08: qty 2

## 2023-04-08 MED ORDER — AMLODIPINE BESYLATE 5 MG PO TABS: 10.0000 mg | ORAL_TABLET | Freq: Every day | ORAL | 1 refills | Status: DC

## 2023-04-08 MED ORDER — LIDOCAINE 5 % EX PTCH: 2.0000 | MEDICATED_PATCH | CUTANEOUS | 0 refills | Status: DC
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
## 2023-04-08 NOTE — TOC Transition Note (Signed)
 Transition of Care [facility]) - Discharge Note   Patient Details  Name: Rita Taylor MRN: [id number] Date of Birth: 29-Jan-1941  [facility]) CM/SW Contact:  Allena Katz, LCSW Phone Number: 04/08/2023, 9:19 AM   Clinical Narrative:   Pt has orders to discharge home with [facility] daughter at bedside. Daughter at bedside. Cheryl with am[facility]ysis notified.    Final next level of care: Home w Home Health Services Barriers to Discharge: Barriers Resolved   Patient Goals and CMS Choice Patient states their goals for this hospitalization and ongoing recovery are:: go home wtih [facility] CMS Medicare.gov Compare Post Acute Care list provided to:: Patient        Discharge Placement                Patient to be transferred to facility by: daughter Name of family member notified: daughter at bedside. Patient and family notified of of transfer: 04/08/23  Discharge Plan and Services Additional resources added to the After Visit Summary for                                       Social Drivers of Health (SDOH) Interventions SDOH Screenings   Food Insecurity: No Food Insecurity (03/31/2023)  Housing: Low Risk  (03/31/2023)  Transportation Needs: No Transportation Needs (03/31/2023)  Utilities: Not At Risk (03/31/2023)  Depression (PHQ2-9): Low Risk  (11/29/2022)  Financial Resource Strain: Low Risk  (06/03/2022)  Physical Activity: Unknown (06/03/2022)  Social Connections: Unknown (04/06/2023)  Stress: No Stress Concern Present (06/03/2022)  Tobacco Use: Low Risk  (03/30/2023)     Readmission Risk Interventions     No data to display
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
## 2023-04-08 NOTE — Progress Notes (Addendum)
 Inpatient Rehabilitation Admissions Coordinator   Notified by [facility] SW that son asking for CIR admit. I contacted Doug by phone. He states his nephew is caring for his Dad who ws discharged home yesterday, but they are not yet prepared to care for Mom also at home. He is looking to hire permanent caregivers to provide assistance to both parents. [name] and his sister, [name], both work. I have asked [name], to come to [facility] to visit his Mom and then contact me by conference call so that we can discuss with his Mom her wishes. I have discussed case with Dr Riley Kill and will follow up after our conference call. CIR can not obtain SNF bed after a CIR admit if discharge arrangements can not be provided with 24/7 caregivers ,the same as unable to obtain SNF bed from [facility].  Ottie Glazier, RN, MSN Rehab Admissions Coordinator [phone] 04/08/2023 12:21 PM  I spoke to son and patient by phone at her bedside. I reviewed goals and expectations of a CIR admit. Patient is in agreement and I can admit her today. I will make the arrangements for [facility] transport. Acute Team and TOC made aware. Dr Natale Lay to admit to room [address] [place], MSN Rehab Admissions Coordinator [phone] 04/08/2023 12:51 PM
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
## 2023-04-08 NOTE — Progress Notes (Signed)
 Pt left to CIR via [facility]. Pt sent with all belongings. Family was notified of patients transfer. I called [phone] and gave report to Nurse Faby assuming care of patient. Pt going to room 21 on [address].

## 2023-04-08 NOTE — TOC Transition Note (Signed)
 Transition of Care [facility]) - Discharge Note   Patient Details  Name: Rita Taylor MRN: [id number] Date of Birth: 08-15-1940  Transition of Care [facility]) CM/SW Contact:  Allena Katz, LCSW Phone Number: 04/08/2023, 2:08 PM   Clinical Narrative:    Pt has orders to discharge to CIR. Medical neccesity printed. CIR to call [facility] for transport.   Final next level of care: IP Rehab Facility Barriers to Discharge: Barriers Resolved   Patient Goals and CMS Choice Patient states their goals for this hospitalization and ongoing recovery are:: go to CIR CMS Medicare.gov Compare Post Acute Care list provided to:: Patient        Discharge Placement                Patient to be transferred to facility by: [facility] Name of family member notified: doug Patient and family notified of of transfer: 04/08/23  Discharge Plan and Services Additional resources added to the After Visit Summary for                                       Social Drivers of Health (SDOH) Interventions SDOH Screenings   Food Insecurity: No Food Insecurity (03/31/2023)  Housing: Low Risk  (03/31/2023)  Transportation Needs: No Transportation Needs (03/31/2023)  Utilities: Not At Risk (03/31/2023)  Depression (PHQ2-9): Low Risk  (11/29/2022)  Financial Resource Strain: Low Risk  (06/03/2022)  Physical Activity: Unknown (06/03/2022)  Social Connections: Unknown (04/06/2023)  Stress: No Stress Concern Present (06/03/2022)  Tobacco Use: Low Risk  (03/30/2023)     Readmission Risk Interventions     No data to display
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
## 2023-04-08 NOTE — Progress Notes (Signed)
 Orthopedic Tech Progress Note Patient Details:  Rita Taylor 1940/10/05 [id number]  Patient ID: [name], female   DOB: 05/29/1940, 83 y.o.   MRN: [id number] Pt. Has C-collar already Tonye Pearson 04/08/2023, 5:16 PM

## 2023-04-09 DIAGNOSIS — R32 Unspecified urinary incontinence: Secondary | ICD-10-CM

## 2023-04-09 DIAGNOSIS — I951 Orthostatic hypotension: Secondary | ICD-10-CM | POA: Diagnosis not present

## 2023-04-09 DIAGNOSIS — I10 Essential (primary) hypertension: Secondary | ICD-10-CM | POA: Diagnosis not present

## 2023-04-09 DIAGNOSIS — D72829 Elevated white blood cell count, unspecified: Secondary | ICD-10-CM

## 2023-04-09 DIAGNOSIS — S129XXD Fracture of neck, unspecified, subsequent encounter: Secondary | ICD-10-CM | POA: Diagnosis not present

## 2023-04-09 LAB — CBC WITH DIFFERENTIAL/PLATELET
Abs Immature Granulocytes: 0.1 10*3/uL — ABNORMAL HIGH (ref 0.00–0.07)
Basophils Absolute: 0.1 10*3/uL (ref 0.0–0.1)
Basophils Relative: 1 %
Eosinophils Absolute: 0.1 10*3/uL (ref 0.0–0.5)
Eosinophils Relative: 1 %
HCT: 33.2 % — ABNORMAL LOW (ref 36.0–46.0)
Hemoglobin: 10.8 g/dL — ABNORMAL LOW (ref 12.0–15.0)
Immature Granulocytes: 1 %
Lymphocytes Relative: 20 %
Lymphs Abs: 2.5 10*3/uL (ref 0.7–4.0)
MCH: 29.5 pg (ref 26.0–34.0)
MCHC: 32.5 g/dL (ref 30.0–36.0)
MCV: 90.7 fL (ref 80.0–100.0)
Monocytes Absolute: 0.7 10*3/uL (ref 0.1–1.0)
Monocytes Relative: 5 %
Neutro Abs: 9.4 10*3/uL — ABNORMAL HIGH (ref 1.7–7.7)
Neutrophils Relative %: 72 %
Platelets: 471 10*3/uL — ABNORMAL HIGH (ref 150–400)
RBC: 3.66 MIL/uL — ABNORMAL LOW (ref 3.87–5.11)
RDW: 14.4 % (ref 11.5–15.5)
WBC: 12.9 10*3/uL — ABNORMAL HIGH (ref 4.0–10.5)
nRBC: 0 % (ref 0.0–0.2)

## 2023-04-09 LAB — COMPREHENSIVE METABOLIC PANEL
ALT: 31 U/L (ref 0–44)
AST: 20 U/L (ref 15–41)
Albumin: 2.8 g/dL — ABNORMAL LOW (ref 3.5–5.0)
Alkaline Phosphatase: 81 U/L (ref 38–126)
Anion gap: 10 (ref 5–15)
BUN: 16 mg/dL (ref 8–23)
CO2: 27 mmol/L (ref 22–32)
Calcium: 8.7 mg/dL — ABNORMAL LOW (ref 8.9–10.3)
Chloride: 100 mmol/L (ref 98–111)
Creatinine, Ser: 0.52 mg/dL (ref 0.44–1.00)
GFR, Estimated: >60 mL/min (ref >60)
Glucose, Bld: 145 mg/dL — ABNORMAL HIGH (ref 70–99)
Potassium: 3.6 mmol/L (ref 3.5–5.1)
Sodium: 137 mmol/L (ref 135–145)
Total Bilirubin: 0.6 mg/dL (ref 0.0–1.2)
Total Protein: 5.8 g/dL — ABNORMAL LOW (ref 6.5–8.1)

## 2023-04-09 NOTE — Plan of Care (Signed)
  Problem: Sit to Stand Goal: LTG:  Patient will perform sit to stand in prep for activites of daily living with assistance level (OT) Description: LTG:  Patient will perform sit to stand in prep for activites of daily living with assistance level (OT) Flowsheets (Taken 04/09/2023 1147) LTG: PT will perform sit to stand in prep for activites of daily living with assistance level: Supervision/Verbal cueing   Problem: RH Eating Goal: LTG Patient will perform eating w/assist, cues/equip (OT) Description: LTG: Patient will perform eating with assist, with/without cues using equipment (OT) Flowsheets (Taken 04/09/2023 1147) LTG: Pt will perform eating with assistance level of: Independent with assistive device  LTG: Pt will perform eating using equipment: Mirror   Problem: RH Grooming Goal: LTG Patient will perform grooming w/assist,cues/equip (OT) Description: LTG: Patient will perform grooming with assist, with/without cues using equipment (OT) Flowsheets (Taken 04/09/2023 1147) LTG: Pt will perform grooming with assistance level of: Set up assist    Problem: RH Bathing Goal: LTG Patient will bathe all body parts with assist levels (OT) Description: LTG: Patient will bathe all body parts with assist levels (OT) Flowsheets (Taken 04/09/2023 1147) LTG: Pt will perform bathing with assistance level/cueing: Supervision/Verbal cueing   Problem: RH Dressing Goal: LTG Patient will perform upper body dressing (OT) Description: LTG Patient will perform upper body dressing with assist, with/without cues (OT). Flowsheets (Taken 04/09/2023 1147) LTG: Pt will perform upper body dressing with assistance level of: Set up assist Goal: LTG Patient will perform lower body dressing w/assist (OT) Description: LTG: Patient will perform lower body dressing with assist, with/without cues in positioning using equipment (OT) Flowsheets (Taken 04/09/2023 1147) LTG: Pt will perform lower body dressing with assistance  level of: Set up assist   Problem: RH Toileting Goal: LTG Patient will perform toileting task (3/3 steps) with assistance level (OT) Description: LTG: Patient will perform toileting task (3/3 steps) with assistance level (OT)  Flowsheets (Taken 04/09/2023 1147) LTG: Pt will perform toileting task (3/3 steps) with assistance level: Supervision/Verbal cueing   Problem: RH Toilet Transfers Goal: LTG Patient will perform toilet transfers w/assist (OT) Description: LTG: Patient will perform toilet transfers with assist, with/without cues using equipment (OT) Flowsheets (Taken 04/09/2023 1150) LTG: Pt will perform toilet transfers with assistance level of: Supervision/Verbal cueing
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
## 2023-04-09 NOTE — Plan of Care (Signed)
  Problem: RH Balance Goal: LTG Patient will maintain dynamic sitting balance (PT) Description: LTG:  Patient will maintain dynamic sitting balance with assistance during mobility activities (PT) Flowsheets (Taken 04/09/2023 1425) LTG: Pt will maintain dynamic sitting balance during mobility activities with:: Supervision/Verbal cueing Goal: LTG Patient will maintain dynamic standing balance (PT) Description: LTG:  Patient will maintain dynamic standing balance with assistance during mobility activities (PT) Flowsheets (Taken 04/09/2023 1425) LTG: Pt will maintain dynamic standing balance during mobility activities with:: Supervision/Verbal cueing   Problem: Sit to Stand Goal: LTG:  Patient will perform sit to stand with assistance level (PT) Description: LTG:  Patient will perform sit to stand with assistance level (PT) Flowsheets (Taken 04/09/2023 1425) LTG: PT will perform sit to stand in preparation for functional mobility with assistance level: Supervision/Verbal cueing   Problem: RH Bed Mobility Goal: LTG Patient will perform bed mobility with assist (PT) Description: LTG: Patient will perform bed mobility with assistance, with/without cues (PT). Flowsheets (Taken 04/09/2023 1425) LTG: Pt will perform bed mobility with assistance level of: Supervision/Verbal cueing   Problem: RH Bed to Chair Transfers Goal: LTG Patient will perform bed/chair transfers w/assist (PT) Description: LTG: Patient will perform bed to chair transfers with assistance (PT). Flowsheets (Taken 04/09/2023 1425) LTG: Pt will perform Bed to Chair Transfers with assistance level: Supervision/Verbal cueing   Problem: RH Car Transfers Goal: LTG Patient will perform car transfers with assist (PT) Description: LTG: Patient will perform car transfers with assistance (PT). Flowsheets (Taken 04/09/2023 1425) LTG: Pt will perform car transfers with assist:: Supervision/Verbal cueing   Problem: RH Ambulation Goal: LTG  Patient will ambulate in controlled environment (PT) Description: LTG: Patient will ambulate in a controlled environment, # of feet with assistance (PT). Flowsheets (Taken 04/09/2023 1425) LTG: Pt will ambulate in controlled environ  assist needed:: Supervision/Verbal cueing LTG: Ambulation distance in controlled environment: 150' Goal: LTG Patient will ambulate in home environment (PT) Description: LTG: Patient will ambulate in home environment, # of feet with assistance (PT). Flowsheets (Taken 04/09/2023 1425) LTG: Pt will ambulate in home environ  assist needed:: Supervision/Verbal cueing LTG: Ambulation distance in home environment: 50'   Problem: RH Stairs Goal: LTG Patient will ambulate up and down stairs w/assist (PT) Description: LTG: Patient will ambulate up and down # of stairs with assistance (PT) Flowsheets (Taken 04/09/2023 1425) LTG: Pt will ambulate up/down stairs assist needed:: Supervision/Verbal cueing LTG: Pt will  ambulate up and down number of stairs: 4 steps with unilateral HR per home set up
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
## 2023-04-09 NOTE — Progress Notes (Signed)
 Inpatient Rehabilitation Admission Medication Review by a Pharmacist  A complete drug regimen review was completed for this patient to identify any potential clinically significant medication issues.  High Risk Drug Classes Is patient taking? Indication by Medication  Antipsychotic Yes, as an intravenous medication Compazine- N/V  Anticoagulant Yes Lovenox- vte ppx  Antibiotic No   Opioid Yes OxyIR- acute pain  Antiplatelet Yes Aspirin- cva ppx  Hypoglycemics/insulin No   Vasoactive Medication Yes Norvasc, cozaar- HTN  Chemotherapy No   Other Yes Robaxin- muscle relaxant Pravachol- HLD Gabapentin- neuropathic pain Wellbutrin- MDD Benadryl- itching Trazodone- sleep     Type of Medication Issue Identified Description of Issue Recommendation(s)  Drug Interaction(s) (clinically significant)     Duplicate Therapy     Allergy     No Medication Administration End Date     Incorrect Dose     Additional Drug Therapy Needed     Significant med changes from prior encounter (inform family/care partners about these prior to discharge).    Other  PTA meds: Prolia Hyzaar Pamelor Restart PTA meds when and if necessary during CIR admission or at time of discharge, if warranted     Clinically significant medication issues were identified that warrant physician communication and completion of prescribed/recommended actions by midnight of the next day:  No    Time spent performing this drug regimen review (minutes):  30   Charl Wellen BS, PharmD, BCPS Clinical Pharmacist 04/09/2023 7:09 AM  Contact: [phone] after 3 PM  "Be curious, not judgmental..." -Debbora Dus
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
## 2023-04-09 NOTE — Plan of Care (Signed)
  Problem: RH Problem Solving Goal: LTG Patient will demonstrate problem solving for (SLP) Description: LTG:  Patient will demonstrate problem solving for basic/complex daily situations with cues  (SLP) Flowsheets (Taken 04/09/2023 1647) LTG Patient will demonstrate problem solving for: Moderate Assistance - Patient 50 - 74%   Problem: RH Memory Goal: LTG Patient will use memory compensatory aids to (SLP) Description: LTG:  Patient will use memory compensatory aids to recall biographical/new, daily complex information with cues (SLP) Flowsheets (Taken 04/09/2023 1647) LTG: Patient will use memory compensatory aids to (SLP): Moderate Assistance - Patient 50 - 74%   Problem: RH Swallowing Goal: LTG Patient will consume least restrictive diet using compensatory strategies with assistance (SLP) Description: LTG:  Patient will consume least restrictive diet using compensatory strategies with assistance (SLP) Flowsheets (Taken 04/09/2023 1649) LTG: Pt Patient will consume least restrictive diet using compensatory strategies with assistance of (SLP): Minimal Assistance - Patient > 75%

## 2023-04-10 DIAGNOSIS — R32 Unspecified urinary incontinence: Secondary | ICD-10-CM | POA: Diagnosis not present

## 2023-04-10 DIAGNOSIS — S129XXD Fracture of neck, unspecified, subsequent encounter: Secondary | ICD-10-CM | POA: Diagnosis not present

## 2023-04-10 DIAGNOSIS — I951 Orthostatic hypotension: Secondary | ICD-10-CM | POA: Diagnosis not present

## 2023-04-10 DIAGNOSIS — I10 Essential (primary) hypertension: Secondary | ICD-10-CM | POA: Diagnosis not present

## 2023-04-10 DIAGNOSIS — M546 Pain in thoracic spine: Secondary | ICD-10-CM

## 2023-04-10 NOTE — Plan of Care (Signed)
  Problem: Consults Goal: RH GENERAL PATIENT EDUCATION Description: See Patient Education module for education specifics. Outcome: Progressing   Problem: RH BOWEL ELIMINATION Goal: RH STG MANAGE BOWEL WITH ASSISTANCE Description: STG Manage Bowel with minimal Assistance. Outcome: Progressing   Problem: RH BLADDER ELIMINATION Goal: RH STG MANAGE BLADDER WITH ASSISTANCE Description: STG Manage Bladder With  minimal Assistance Outcome: Progressing   Problem: RH SKIN INTEGRITY Goal: RH STG SKIN FREE OF INFECTION/BREAKDOWN Description:  Manage skin free of infection/ breakdown with minimal assistance  Outcome: Progressing Goal: RH STG ABLE TO PERFORM INCISION/WOUND CARE W/ASSISTANCE Description: STG Able To Perform Incision/Wound Care With Assistance. Outcome: Progressing   Problem: RH SAFETY Goal: RH STG ADHERE TO SAFETY PRECAUTIONS W/ASSISTANCE/DEVICE Description: STG Adhere to Safety Precautions With minimal Assistance/Device. Outcome: Progressing   Problem: RH PAIN MANAGEMENT Goal: RH STG PAIN MANAGED AT OR BELOW PT'S PAIN GOAL Outcome: Progressing
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
## 2023-04-11 ENCOUNTER — Inpatient Hospital Stay (HOSPITAL_COMMUNITY): Payer: Medicare Other

## 2023-04-11 DIAGNOSIS — S129XXS Fracture of neck, unspecified, sequela: Secondary | ICD-10-CM | POA: Diagnosis not present

## 2023-04-11 LAB — CBC
HCT: 35.7 % — ABNORMAL LOW (ref 36.0–46.0)
Hemoglobin: 11.3 g/dL — ABNORMAL LOW (ref 12.0–15.0)
MCH: 28.9 pg (ref 26.0–34.0)
MCHC: 31.7 g/dL (ref 30.0–36.0)
MCV: 91.3 fL (ref 80.0–100.0)
Platelets: 551 10*3/uL — ABNORMAL HIGH (ref 150–400)
RBC: 3.91 MIL/uL (ref 3.87–5.11)
RDW: 14.4 % (ref 11.5–15.5)
WBC: 14.4 10*3/uL — ABNORMAL HIGH (ref 4.0–10.5)
nRBC: 0 % (ref 0.0–0.2)

## 2023-04-11 LAB — BASIC METABOLIC PANEL
Anion gap: 9 (ref 5–15)
BUN: 18 mg/dL (ref 8–23)
CO2: 30 mmol/L (ref 22–32)
Calcium: 9 mg/dL (ref 8.9–10.3)
Chloride: 101 mmol/L (ref 98–111)
Creatinine, Ser: 0.58 mg/dL (ref 0.44–1.00)
GFR, Estimated: >60 mL/min (ref >60)
Glucose, Bld: 141 mg/dL — ABNORMAL HIGH (ref 70–99)
Potassium: 4.4 mmol/L (ref 3.5–5.1)
Sodium: 140 mmol/L (ref 135–145)

## 2023-04-11 LAB — URINALYSIS, W/ REFLEX TO CULTURE (INFECTION SUSPECTED)
Bilirubin Urine: NEGATIVE
Glucose, UA: NEGATIVE mg/dL
Hgb urine dipstick: NEGATIVE
Ketones, ur: NEGATIVE mg/dL
Leukocytes,Ua: NEGATIVE
Nitrite: NEGATIVE
Protein, ur: NEGATIVE mg/dL
Specific Gravity, Urine: 1.024 (ref 1.005–1.030)
pH: 6 (ref 5.0–8.0)

## 2023-04-11 MED ORDER — METOPROLOL TARTRATE 12.5 MG HALF TABLET
12.5000 mg | ORAL_TABLET | Freq: Two times a day (BID) | ORAL | Status: DC
  Administered 2023-04-12 – 2023-04-13 (×3): 12.5 mg via ORAL
  Filled 2023-04-11 (×3): qty 1

## 2023-04-11 NOTE — Progress Notes (Signed)
 Encouraged fluids. Additional fluids given with med pass.   Tilden Dome, LPN
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
## 2023-04-11 NOTE — Progress Notes (Signed)
 Physical Therapy Session Note  Patient Details  Name: Rita Taylor MRN: [id number] Date of Birth: Dec 09, 1940  Today's Date: 04/11/2023 PT Individual Time: 8657-8469 + [id number] PT Individual Time Calculation (min): 58 min + 30 min  Short Term Goals: Week 1:  PT Short Term Goal 1 (Week 1): Pt will complete bed mobility with CGA PT Short Term Goal 2 (Week 1): Pt will complete transfers with min assist and LRAD PT Short Term Goal 3 (Week 1): Pt will ambulate 100' with RW min assist PT Short Term Goal 4 (Week 1): Pt will tolerate OOB for 2 hours outside therapy sessions  Skilled Therapeutic Interventions/Progress Updates:    SESSION 1: Pt presents in room in bed, perpendicular to [facility] with pt in hooklying, pt denies pain, and agreeable to PT. Session focused on therapeutic activities for participating with self care tasks, tolerance to upright, sitting balance, and positioning with vitals management.  Pt provided with thigh TED hose and pt completes rolling bilaterally in bed for doffing/donning brief and periarea hygiene with mod assist for rolling, total assist for managing pants and brief. Therapist provides total assist to assist pt in sitting EOB due to fatigue, lethargy, and poor core strength. Therapist provides min/mod assist for sitting balance, dons c-collar with pt EOB and pt requests to lie down. Pt takes supine rest break, therapist provides max assist for donning pants with pt able to complete bridging for pulling pants over hips. Pt completes supine to sit with total assist. Pt then completes stand pivot transfer with max assist to WC. Pt requires max assist for doffing hospital gown and donning shirt despite max verbal cues for participation with pt unable to maintain upright sitting posture and minimally able to lift BUEs actively. Pt reporting pain in L upper trapezius muscle, RN notified to provide pain medication during session.  Pt transported to day room dependently in [facility], pt  provided with warm blanket to decrease pain in L shoulder/upper trap. Pt remains seated in WC with encouragement to drink fluids. Therapist retrieves TIS WC due to pt with poor positioning in standard WC to allow for increased OOB tolerance. Pt completes x2 stand pivot transfers to [facility], able to sit~ 30seconds without back support seated EOM for therapist transition cushion and comes to sitting in TIS WC.  Pt vitals assessed to be BP 124/69, HR 114 bpm. MD present to assess with pt lethargic but does respond to all questions and cues.  Pt returns to room and remains seated in [facility] with all needs within reach, cal light in place and chair alarm donned and activated at end of session.   SESSION 2: Pt presents in room in bed alert and speaking with husband at bedside. Pt reporting feeling better than AM session, and denies pain at this time. Pt agreeable to PT with encouragement. Session focused on gait training, transfer training, and bed mobility training.  Pt completes bed mobility with max assist and max verbal cues for sequencing. Pt requires min assist sitting balance and completes stand step trasnfer with max assist to TIS WC. Pt transported to day room dependently for time management and energy conservation.  Pt completes sit to stand to RW with mod assist, demonstrating retropulsion, ambulates 30' with cone as target, requires mod/max assist for ambulating with RW with pt demonstrating retropulsion and requires assist for managing RW for turns and straightaways despite max verbal cues for attention to task. Pt returns to sitting in TIS WC with max assist as pt  attempting to sit prematurely with pt stating she is fatigued.  Pt then compeltes sit<>stand with verbal/visual demonstration of body mechanics to facilitate anterior wt shift and decrease retropulsion for stand however pt continues to require heavy mod assist to stand. Pt remains standing at RW ~30 seconds with max verbal/tactile cues for  anterior wt shift, allowed pt to feeling LOB posteriorly to attempt to correct with pt demonstrating minimal to absent self correction with task. Pt requests to sit and asks for water, therapist provides water and assists with drinking water during session.  Pt returns to room via [facility], completes stand step transfer with max assist, mod verbal cues for anterior wt shift, and completes bed mobility with total assist due to pt attempting to lie down towards foot of bed. Pt remains supine in bed with all needs within reach, cal light in place and bed alarm activated at end of session.    Therapy Documentation Precautions:  Precautions Precautions: Cervical, Fall Recall of Precautions/Restrictions: Impaired Precaution/Restrictions Comments: c-collar OOB and with activity, okay to be off to sleep and eat Required Braces or Orthoses: Cervical Brace Cervical Brace: Hard collar Restrictions Weight Bearing Restrictions Per Provider Order: No    Therapy/Group: Individual Therapy  Edwin Cap PT, DPT 04/11/2023, 12:28 PM

## 2023-04-11 NOTE — Progress Notes (Signed)
 Patient ID: Rita Taylor, female   DOB: [birth year], 83 y.o.   MRN: [id number]  [phone]- SW left message for pt son Rita Taylor to introduce self, explain role, discuss discharge process, and inform on ELOS. SW requested return phone call to confirm discharge plan. SW waiting on follow-up.    Cecile Sheerer, MSW, LCSW Office: [phone] Cell: [phone] Fax: [phone]

## 2023-04-11 NOTE — Plan of Care (Signed)
  Problem: Consults Goal: RH GENERAL PATIENT EDUCATION Description: See Patient Education module for education specifics. Outcome: Progressing   Problem: RH BOWEL ELIMINATION Goal: RH STG MANAGE BOWEL WITH ASSISTANCE Description: STG Manage Bowel with minimal Assistance. Outcome: Progressing   Problem: RH BLADDER ELIMINATION Goal: RH STG MANAGE BLADDER WITH ASSISTANCE Description: STG Manage Bladder With  minimal Assistance Outcome: Progressing   Problem: RH BLADDER ELIMINATION Goal: RH STG MANAGE BLADDER WITH ASSISTANCE Description: STG Manage Bladder With  minimal Assistance Outcome: Progressing   Problem: RH SKIN INTEGRITY Goal: RH STG SKIN FREE OF INFECTION/BREAKDOWN Description:  Manage skin free of infection/ breakdown with minimal assistance  Outcome: Progressing Goal: RH STG ABLE TO PERFORM INCISION/WOUND CARE W/ASSISTANCE Description: STG Able To Perform Incision/Wound Care With Assistance. Outcome: Progressing   Problem: RH SAFETY Goal: RH STG ADHERE TO SAFETY PRECAUTIONS W/ASSISTANCE/DEVICE Description: STG Adhere to Safety Precautions With minimal Assistance/Device. Outcome: Progressing   Problem: RH PAIN MANAGEMENT Goal: RH STG PAIN MANAGED AT OR BELOW PT'S PAIN GOAL Outcome: Progressing   Problem: RH KNOWLEDGE DEFICIT GENERAL Goal: RH STG INCREASE KNOWLEDGE OF SELF CARE AFTER HOSPITALIZATION Description: Manage knowledge of self care after hospitalization with minimal assistance using educational materials provided   Outcome: Progressing

## 2023-04-11 NOTE — Progress Notes (Signed)
 Urine specimen collected and sent to lab per MD order.    Tilden Dome, LPN

## 2023-04-11 NOTE — Progress Notes (Signed)
 Occupational Therapy Session Note  Patient Details  Name: Rita Taylor MRN: [id number] Date of Birth: 25-Jul-1940  {CHL IP REHAB OT TIME CALCULATIONS:[id number]}   Short Term Goals: Week 1:  OT Short Term Goal 1 (Week 1): Patient to perform toileting with Min assist OT Short Term Goal 2 (Week 1): Patient to perform LE dressing with Min assist OT Short Term Goal 3 (Week 1): Patient to perform bathing with Min assist  Skilled Therapeutic Interventions/Progress Updates:    Patient agreeable to participate in OT session. Reports *** pain level.   Patient participated in skilled OT session focusing on ***. Therapist facilitated/assessed/developed/educated/integrated/elicited *** in order to improve/facilitate/promote    Therapy Documentation Precautions:  Precautions Precautions: Cervical, Fall Recall of Precautions/Restrictions: Impaired Precaution/Restrictions Comments: c-collar OOB and with activity, okay to be off to sleep and eat Required Braces or Orthoses: Cervical Brace Cervical Brace: Hard collar Restrictions Weight Bearing Restrictions Per Provider Order: No   Therapy/Group: Individual Therapy  Limmie Patricia, OTR/L,CBIS  Supplemental OT - [facility] and [facility] Secure Chat Preferred   04/11/2023, 9:57 PM

## 2023-04-11 NOTE — Progress Notes (Signed)
 Speech Language Pathology Daily Session Note  Patient Details  Name: Rita Taylor MRN: [id number] Date of Birth: 11/05/1940  Today's Date: 04/11/2023 SLP Individual Time: 0102-0147 SLP Individual Time Calculation (min): 45 min and Today's Date: 04/11/2023 SLP Missed Time: 15 Minutes Missed Time Reason: Patient fatigue  Short Term Goals: Week 1: SLP Short Term Goal 1 (Week 1): Pt will demonstrate improved mastication during trials of D3 diet textures to determine the potential for diet texture upgrade. SLP Short Term Goal 2 (Week 1): Pt will complete functional problem solving tasks with min-mod A when provided visual and/or verbal cues. SLP Short Term Goal 3 (Week 1): Pt will recall functional information in the current environment using compensatory memory strategies when provided mod A.  Skilled Therapeutic Interventions:  Patient was seen in PM to address cognitive re- training. Pt was easily alerted upon SLP arrival and agreeable for session. She was oriented with supervision to temporal, spatial, and situational concepts. Pt challenged to recall biographical and functional information. She recalled her address with supervision cues. Pt initially reporting her children live with her however later in session able to verbalize they didn't. She recalled their spouse's names however had significant difficulty recalling her grandchildren's names. Pt was challenged in abstract reasoning compare and contrast activity. Pt completed task with 75% acc and min A. SLP challenged pt in recall of daily activities with pt requiring mod A to recall participation and task completed in PT and OT sessions. As session progressed pt observed with decreased verbal responsiveness and subsequent falling asleep occurring with greater frequency as session progressed. Session ultimately discontinued due to pt fatigue. Pt left in bed with call button within reach and bed alarm active. SLP to continue POC.   Pain Pain  Assessment Pain Scale: 0-10 Pain Score: 3  Pain Type: Acute pain Pain Location: Neck Pain Descriptors / Indicators: Grimacing Pain Intervention(s): Repositioned  Therapy/Group: Individual Therapy  Renaee Munda 04/11/2023, 1:54 PM

## 2023-04-11 NOTE — Progress Notes (Signed)
 Occupational Therapy Session Note  Patient Details  Name: Rita Taylor MRN: [id number] Date of Birth: 03-10-1940  Today's Date: 04/11/2023 OT Individual Time: 4098-1191 OT Individual Time Calculation (min): 30 min  and Today's Date: 04/11/2023 OT Missed Time: 30 Minutes Missed Time Reason: Pain;Patient fatigue  Short Term Goals: Week 1:  OT Short Term Goal 1 (Week 1): Patient to perform toileting with Min assist OT Short Term Goal 2 (Week 1): Patient to perform LE dressing with Min assist OT Short Term Goal 3 (Week 1): Patient to perform bathing with Min assist  Skilled Therapeutic Interventions/Progress Updates:    Pt greeted supine in bed, not wanting to participate in therapy due to pain. OT was able to convince pt to try to use the bathroom. Max A for bed mobility with posterior LOB in sitting requiring mod A to correct. Stedy transfer with mod A and facilitation for upright trunk, unable to achieve full extension, but able to get hips up enough to place Stedy paddles. Pt needed total A to manage clothing, then sat on BSC over toilet. Pt unable to have BM or void bladder with extended time. Mod A to get to standing again in similar fashion as from EOB. Total A for clothing. Pt insisted on returning to bed saying " I need to lay down, I need to lay down." OT returned pt to bed with max A to bring B LE's back into bed. Pt completed 3 sets of 10 knee extension, then reported max fatigue. Pt missed 30 minutes of OT treatment session due to pain and fatigue.   Therapy Documentation Precautions:  Precautions Precautions: Cervical, Fall Recall of Precautions/Restrictions: Impaired Precaution/Restrictions Comments: c-collar OOB and with activity, okay to be off to sleep and eat Required Braces or Orthoses: Cervical Brace Cervical Brace: Hard collar Restrictions Weight Bearing Restrictions Per Provider Order: No General: General OT Amount of Missed Time: 30 Minutes Pain: 7/10 neck pain,  rest and repositioned   Therapy/Group: Individual Therapy  Mal Amabile 04/11/2023, 11:20 AM

## 2023-04-11 NOTE — Care Management (Signed)
 Inpatient Rehabilitation Center Individual Statement of Services  Patient Name:  Rita Taylor  Date:  04/11/2023  Welcome to the Inpatient Rehabilitation Center.  Our goal is to provide you with an individualized program based on your diagnosis and situation, designed to meet your specific needs.  With this comprehensive rehabilitation program, you will be expected to participate in at least 3 hours of rehabilitation therapies Monday-Friday, with modified therapy programming on the weekends.  Your rehabilitation program will include the following services:  Physical Therapy (PT), Occupational Therapy (OT), Speech Therapy (ST), 24 hour per day rehabilitation nursing, Therapeutic Recreaction (TR), Psychology, Neuropsychology, Care Coordinator, Rehabilitation Medicine, Nutrition Services, Pharmacy Services, and Other  Weekly team conferences will be held on Tuesdays to discuss your progress.  Your Inpatient Rehabilitation Care Coordinator will talk with you frequently to get your input and to update you on team discussions.  Team conferences with you and your family in attendance may also be held.  Expected length of stay: 10-14 days     Overall anticipated outcome: Supervision  Depending on your progress and recovery, your program may change. Your Inpatient Rehabilitation Care Coordinator will coordinate services and will keep you informed of any changes. Your Inpatient Rehabilitation Care Coordinator's name and contact numbers are listed  below.  The following services may also be recommended but are not provided by the Inpatient Rehabilitation Center:  Driving Evaluations Home Health Rehabiltiation Services Outpatient Rehabilitation Services Vocational Rehabilitation   Arrangements will be made to provide these services after discharge if needed.  Arrangements include referral to agencies that provide these services.  Your insurance has been verified to be:  Medicare A/B  Your primary  doctor is:  Duncan Dull  Pertinent information will be shared with your doctor and your insurance company.  Inpatient Rehabilitation Care Coordinator:  Susie Cassette [phone] or [phone]  Information discussed with and copy given to patient by: Gretchen Short, 04/11/2023, 9:57 AM

## 2023-04-11 NOTE — Progress Notes (Signed)
 Inpatient Rehabilitation  Patient information reviewed and entered into eRehab system by Cheri Rous, OTR/L, Rehab Quality Coordinator.   Information including medical coding, functional ability and quality indicators will be reviewed and updated through discharge.

## 2023-04-12 ENCOUNTER — Inpatient Hospital Stay (HOSPITAL_COMMUNITY): Payer: Medicare Other

## 2023-04-12 ENCOUNTER — Inpatient Hospital Stay: Payer: Medicare Other | Admitting: Family Medicine

## 2023-04-12 DIAGNOSIS — S129XXS Fracture of neck, unspecified, sequela: Secondary | ICD-10-CM | POA: Diagnosis not present

## 2023-04-12 LAB — CBC WITH DIFFERENTIAL/PLATELET
Abs Immature Granulocytes: 0.11 10*3/uL — ABNORMAL HIGH (ref 0.00–0.07)
Basophils Absolute: 0.1 10*3/uL (ref 0.0–0.1)
Basophils Relative: 1 %
Eosinophils Absolute: 0.1 10*3/uL (ref 0.0–0.5)
Eosinophils Relative: 0 %
HCT: 34.2 % — ABNORMAL LOW (ref 36.0–46.0)
Hemoglobin: 11.1 g/dL — ABNORMAL LOW (ref 12.0–15.0)
Immature Granulocytes: 1 %
Lymphocytes Relative: 18 %
Lymphs Abs: 2.5 10*3/uL (ref 0.7–4.0)
MCH: 29.4 pg (ref 26.0–34.0)
MCHC: 32.5 g/dL (ref 30.0–36.0)
MCV: 90.7 fL (ref 80.0–100.0)
Monocytes Absolute: 1 10*3/uL (ref 0.1–1.0)
Monocytes Relative: 7 %
Neutro Abs: 10.4 10*3/uL — ABNORMAL HIGH (ref 1.7–7.7)
Neutrophils Relative %: 73 %
Platelets: 533 10*3/uL — ABNORMAL HIGH (ref 150–400)
RBC: 3.77 MIL/uL — ABNORMAL LOW (ref 3.87–5.11)
RDW: 14.5 % (ref 11.5–15.5)
WBC: 14.1 10*3/uL — ABNORMAL HIGH (ref 4.0–10.5)
nRBC: 0 % (ref 0.0–0.2)

## 2023-04-12 LAB — TYPE AND SCREEN
ABO/RH(D): O POS
Antibody Screen: NEGATIVE
Unit division: 0
Unit division: 0

## 2023-04-12 LAB — BPAM RBC
Blood Product Expiration Date: 202502192359
Blood Product Expiration Date: 202503182359
ISSUE DATE / TIME: 202502150935
ISSUE DATE / TIME: 202502151240
Unit Type and Rh: 5100
Unit Type and Rh: 5100

## 2023-04-12 MED ORDER — OXYCODONE HCL 5 MG PO TABS
2.5000 mg | ORAL_TABLET | Freq: Four times a day (QID) | ORAL | Status: DC | PRN
  Administered 2023-04-13: 2.5 mg via ORAL
  Filled 2023-04-12: qty 1

## 2023-04-12 MED ORDER — SODIUM CHLORIDE 0.9 % IV SOLN: INTRAVENOUS | Status: AC

## 2023-04-12 MED ORDER — DOCUSATE SODIUM 50 MG/5ML PO LIQD
100.0000 mg | Freq: Two times a day (BID) | ORAL | Status: DC
  Administered 2023-04-13: 100 mg via ORAL
  Filled 2023-04-12: qty 10

## 2023-04-12 NOTE — Progress Notes (Signed)
/  Physical Therapy Session Note  Patient Details  Name: Rita Taylor MRN: [id number] Date of Birth: 10-08-1940  Today's Date: 04/12/2023 PT Individual Time: 1345-1445 PT Individual Time Calculation (min): 60 min   Short Term Goals: Week 1:  PT Short Term Goal 1 (Week 1): Pt will complete bed mobility with CGA PT Short Term Goal 2 (Week 1): Pt will complete transfers with min assist and LRAD PT Short Term Goal 3 (Week 1): Pt will ambulate 100' with RW min assist PT Short Term Goal 4 (Week 1): Pt will tolerate OOB for 2 hours outside therapy sessions  Skilled Therapeutic Interventions/Progress Updates:    Pt presents in room at scheduled therapy time, nursing staff present removing staples. Therapist reenters room at 1345 with pt agreeable to PT, denies pain but continues to demonstrate lethargy and fatigue throughout session. Session focused on transfer training and gait training as well as therapeutic exercise to promote BLE strengthening and activity tolerance.  Orthostatic vitals taken in supine, sitting, and after ambulation as pt unable to maintain standing without full body muscle guarding, charted. Pt completes bed mobility with total assist to sit EOB, min/mod assist for sitting balance with pt demonstrating R lateral lean. Pt completes stand step transfer with max approaching mod assist. Pt requires increased time in each position and max verbal cues for attendance to task due to lethargy.  Pt transported to day room dependently for time management and energy conservation. Pt completes sit to stand with max assist and max verbal/tactile/visual cues due to lethargy with pt keeping R eye closed. Pt ambulates 2x with RW with mod/max assist for postural stability due to retropulsion and RW mgmt and max verbal cues for attendance to task, max assist to sit to WC. Pt requires increased time for rest break between gait trials.  Pt completes interval training on kinetron to promote BLE  strengthening and reciprocal BLEs movements 30 sec work/30-60second rest x5 rounds. Pt requires max verbal/tactile cues to maintain attention to task.  Pt returns to room and completes stand step transfer with mod assist, bed mobility mod assist for BLE mgmt into bed and repositioning in supine. Pt nurse tech notified for brief change and PRAFOs arrive with instructions left in room for nursing on wear schedule. Pt remains supine with nurse tech present at end of session.     Therapy Documentation Precautions:  Precautions Precautions: Cervical, Fall Recall of Precautions/Restrictions: Impaired Precaution/Restrictions Comments: c-collar OOB and with activity, okay to be off to sleep and eat Required Braces or Orthoses: Cervical Brace Cervical Brace: Hard collar Restrictions Weight Bearing Restrictions Per Provider Order: No   Therapy/Group: Individual Therapy  Edwin Cap PT, DPT 04/12/2023, 3:43 PM

## 2023-04-12 NOTE — Progress Notes (Signed)
 Patient ID: Rita Taylor, female   DOB: Jul 17, 1940, 83 y.o.   MRN: [id number]  SW made efforts to update from team conference, but pt with nursing and getting staples taken out. SW will follow-up.   Cecile Sheerer, MSW, LCSW Office: [phone] Cell: [phone] Fax: [phone]
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
## 2023-04-13 ENCOUNTER — Inpatient Hospital Stay
Admit: 2023-04-13 | Discharge: 2023-04-13 | Disposition: A | Discharge status: Home / Self Care | Payer: Self-pay | Attending: Neurosurgery | Admitting: Neurosurgery

## 2023-04-13 ENCOUNTER — Encounter (HOSPITAL_COMMUNITY): Payer: Self-pay

## 2023-04-13 ENCOUNTER — Telehealth: Payer: Self-pay

## 2023-04-13 ENCOUNTER — Inpatient Hospital Stay
Admission: AD | Admit: 2023-04-13 | Discharge: 2023-04-18 | DRG: 031 | Disposition: A | Discharge status: Rehabilitation Facility | Payer: Medicare Other | Source: Other Acute Inpatient Hospital | Attending: Student | Admitting: Student

## 2023-04-13 ENCOUNTER — Other Ambulatory Visit: Payer: Self-pay

## 2023-04-13 ENCOUNTER — Telehealth: Payer: Self-pay | Admitting: Neurosurgery

## 2023-04-13 DIAGNOSIS — Z049 Encounter for examination and observation for unspecified reason: Secondary | ICD-10-CM

## 2023-04-13 DIAGNOSIS — G918 Other hydrocephalus: Secondary | ICD-10-CM | POA: Diagnosis not present

## 2023-04-13 DIAGNOSIS — Z515 Encounter for palliative care: Secondary | ICD-10-CM

## 2023-04-13 DIAGNOSIS — D333 Benign neoplasm of cranial nerves: Principal | ICD-10-CM | POA: Diagnosis present

## 2023-04-13 DIAGNOSIS — E43 Unspecified severe protein-calorie malnutrition: Secondary | ICD-10-CM | POA: Diagnosis present

## 2023-04-13 DIAGNOSIS — Z981 Arthrodesis status: Secondary | ICD-10-CM

## 2023-04-13 DIAGNOSIS — E785 Hyperlipidemia, unspecified: Secondary | ICD-10-CM | POA: Diagnosis present

## 2023-04-13 DIAGNOSIS — Z888 Allergy status to other drugs, medicaments and biological substances status: Secondary | ICD-10-CM | POA: Diagnosis not present

## 2023-04-13 DIAGNOSIS — M21372 Foot drop, left foot: Secondary | ICD-10-CM | POA: Diagnosis present

## 2023-04-13 DIAGNOSIS — I10 Essential (primary) hypertension: Secondary | ICD-10-CM | POA: Diagnosis present

## 2023-04-13 DIAGNOSIS — S22069D Unspecified fracture of T7-T8 vertebra, subsequent encounter for fracture with routine healing: Secondary | ICD-10-CM

## 2023-04-13 DIAGNOSIS — G936 Cerebral edema: Secondary | ICD-10-CM | POA: Diagnosis present

## 2023-04-13 DIAGNOSIS — F32A Depression, unspecified: Secondary | ICD-10-CM | POA: Diagnosis present

## 2023-04-13 DIAGNOSIS — G911 Obstructive hydrocephalus: Secondary | ICD-10-CM | POA: Diagnosis not present

## 2023-04-13 DIAGNOSIS — R29810 Facial weakness: Secondary | ICD-10-CM | POA: Diagnosis present

## 2023-04-13 DIAGNOSIS — Z79899 Other long term (current) drug therapy: Secondary | ICD-10-CM | POA: Diagnosis not present

## 2023-04-13 DIAGNOSIS — R4182 Altered mental status, unspecified: Secondary | ICD-10-CM | POA: Insufficient documentation

## 2023-04-13 DIAGNOSIS — D72829 Elevated white blood cell count, unspecified: Secondary | ICD-10-CM | POA: Insufficient documentation

## 2023-04-13 DIAGNOSIS — R4189 Other symptoms and signs involving cognitive functions and awareness: Secondary | ICD-10-CM | POA: Insufficient documentation

## 2023-04-13 DIAGNOSIS — Z681 Body mass index (BMI) 19 or less, adult: Secondary | ICD-10-CM | POA: Diagnosis not present

## 2023-04-13 DIAGNOSIS — Z7982 Long term (current) use of aspirin: Secondary | ICD-10-CM | POA: Diagnosis not present

## 2023-04-13 DIAGNOSIS — E119 Type 2 diabetes mellitus without complications: Secondary | ICD-10-CM | POA: Diagnosis present

## 2023-04-13 DIAGNOSIS — Z8619 Personal history of other infectious and parasitic diseases: Secondary | ICD-10-CM

## 2023-04-13 DIAGNOSIS — Q8502 Neurofibromatosis, type 2: Secondary | ICD-10-CM

## 2023-04-13 DIAGNOSIS — S129XXS Fracture of neck, unspecified, sequela: Secondary | ICD-10-CM

## 2023-04-13 DIAGNOSIS — S2220XD Unspecified fracture of sternum, subsequent encounter for fracture with routine healing: Secondary | ICD-10-CM

## 2023-04-13 DIAGNOSIS — S129XXA Fracture of neck, unspecified, initial encounter: Secondary | ICD-10-CM | POA: Diagnosis present

## 2023-04-13 DIAGNOSIS — Z85820 Personal history of malignant melanoma of skin: Secondary | ICD-10-CM | POA: Diagnosis not present

## 2023-04-13 DIAGNOSIS — G919 Hydrocephalus, unspecified: Secondary | ICD-10-CM | POA: Diagnosis not present

## 2023-04-13 DIAGNOSIS — Z8249 Family history of ischemic heart disease and other diseases of the circulatory system: Secondary | ICD-10-CM

## 2023-04-13 DIAGNOSIS — Z83438 Family history of other disorder of lipoprotein metabolism and other lipidemia: Secondary | ICD-10-CM | POA: Diagnosis not present

## 2023-04-13 DIAGNOSIS — R638 Other symptoms and signs concerning food and fluid intake: Secondary | ICD-10-CM | POA: Insufficient documentation

## 2023-04-13 LAB — CBC WITH DIFFERENTIAL/PLATELET
Abs Immature Granulocytes: 0.1 10*3/uL — ABNORMAL HIGH (ref 0.00–0.07)
Basophils Absolute: 0.1 10*3/uL (ref 0.0–0.1)
Basophils Relative: 1 %
Eosinophils Absolute: 0.1 10*3/uL (ref 0.0–0.5)
Eosinophils Relative: 0 %
HCT: 32.9 % — ABNORMAL LOW (ref 36.0–46.0)
Hemoglobin: 10.6 g/dL — ABNORMAL LOW (ref 12.0–15.0)
Immature Granulocytes: 1 %
Lymphocytes Relative: 17 %
Lymphs Abs: 2.1 10*3/uL (ref 0.7–4.0)
MCH: 29.9 pg (ref 26.0–34.0)
MCHC: 32.2 g/dL (ref 30.0–36.0)
MCV: 92.9 fL (ref 80.0–100.0)
Monocytes Absolute: 0.6 10*3/uL (ref 0.1–1.0)
Monocytes Relative: 5 %
Neutro Abs: 9.1 10*3/uL — ABNORMAL HIGH (ref 1.7–7.7)
Neutrophils Relative %: 76 %
Platelets: 548 10*3/uL — ABNORMAL HIGH (ref 150–400)
RBC: 3.54 MIL/uL — ABNORMAL LOW (ref 3.87–5.11)
RDW: 14.2 % (ref 11.5–15.5)
WBC: 12.1 10*3/uL — ABNORMAL HIGH (ref 4.0–10.5)
nRBC: 0 % (ref 0.0–0.2)

## 2023-04-13 LAB — TYPE AND SCREEN
ABO/RH(D): O POS
Antibody Screen: NEGATIVE

## 2023-04-13 LAB — BASIC METABOLIC PANEL
Anion gap: 11 (ref 5–15)
BUN: 17 mg/dL (ref 8–23)
CO2: 25 mmol/L (ref 22–32)
Calcium: 8.4 mg/dL — ABNORMAL LOW (ref 8.9–10.3)
Chloride: 102 mmol/L (ref 98–111)
Creatinine, Ser: 0.52 mg/dL (ref 0.44–1.00)
GFR, Estimated: >60 mL/min (ref >60)
Glucose, Bld: 104 mg/dL — ABNORMAL HIGH (ref 70–99)
Potassium: 3.9 mmol/L (ref 3.5–5.1)
Sodium: 138 mmol/L (ref 135–145)

## 2023-04-13 LAB — PROTIME-INR
INR: 1 (ref 0.8–1.2)
Prothrombin Time: 13.8 s (ref 11.4–15.2)

## 2023-04-13 LAB — APTT: aPTT: 31 s (ref 24–36)

## 2023-04-13 LAB — GLUCOSE, CAPILLARY: Glucose-Capillary: 88 mg/dL (ref 70–99)

## 2023-04-13 MED ORDER — METOPROLOL TARTRATE 25 MG PO TABS
12.5000 mg | ORAL_TABLET | Freq: Two times a day (BID) | ORAL | Status: DC
  Administered 2023-04-13 – 2023-04-14 (×3): 12.5 mg via ORAL
  Filled 2023-04-13 (×5): qty 1

## 2023-04-13 MED ORDER — GABAPENTIN 300 MG PO CAPS: 300.0000 mg | ORAL_CAPSULE | Freq: Three times a day (TID) | ORAL | Status: DC

## 2023-04-13 MED ORDER — LOSARTAN POTASSIUM 100 MG PO TABS: 100.0000 mg | ORAL_TABLET | Freq: Every day | ORAL | Status: DC

## 2023-04-13 MED ORDER — OXYCODONE HCL 5 MG PO TABS: 2.5000 mg | ORAL_TABLET | Freq: Four times a day (QID) | ORAL | Status: DC | PRN

## 2023-04-13 MED ORDER — OXYCODONE HCL 5 MG PO TABS
5.0000 mg | ORAL_TABLET | Freq: Four times a day (QID) | ORAL | Status: DC | PRN
  Administered 2023-04-16 (×2): 5 mg
  Filled 2023-04-13 (×2): qty 1

## 2023-04-13 MED ORDER — METHOCARBAMOL 500 MG PO TABS
500.0000 mg | ORAL_TABLET | Freq: Three times a day (TID) | ORAL | Status: DC | PRN
  Administered 2023-04-16: 500 mg
  Filled 2023-04-13: qty 1

## 2023-04-13 MED ORDER — ASPIRIN 81 MG PO TBEC: 81.0000 mg | DELAYED_RELEASE_TABLET | Freq: Every day | ORAL | Status: DC

## 2023-04-13 MED ORDER — GABAPENTIN 300 MG PO CAPS
300.0000 mg | ORAL_CAPSULE | Freq: Three times a day (TID) | ORAL | Status: DC
  Administered 2023-04-13 – 2023-04-15 (×6): 300 mg via ORAL
  Filled 2023-04-13 (×7): qty 1

## 2023-04-13 MED ORDER — DEXAMETHASONE SODIUM PHOSPHATE 10 MG/ML IJ SOLN
10.0000 mg | Freq: Once | INTRAMUSCULAR | Status: AC
  Administered 2023-04-13: 10 mg via INTRAVENOUS
  Filled 2023-04-13: qty 1

## 2023-04-13 MED ORDER — ACETAMINOPHEN 160 MG/5ML PO SOLN: 650.0000 mg | Freq: Four times a day (QID) | ORAL | Status: DC | PRN

## 2023-04-13 MED ORDER — DEXAMETHASONE SODIUM PHOSPHATE 4 MG/ML IJ SOLN
4.0000 mg | Freq: Three times a day (TID) | INTRAMUSCULAR | Status: DC
  Administered 2023-04-14 – 2023-04-15 (×5): 4 mg via INTRAVENOUS
  Filled 2023-04-13 (×7): qty 1

## 2023-04-13 MED ORDER — VITAMIN B-12 1000 MCG PO TABS
1000.0000 ug | ORAL_TABLET | Freq: Every day | ORAL | Status: DC
  Administered 2023-04-14: 1000 ug via ORAL
  Filled 2023-04-13 (×2): qty 1

## 2023-04-13 MED ORDER — LOSARTAN POTASSIUM 50 MG PO TABS
100.0000 mg | ORAL_TABLET | Freq: Every day | ORAL | Status: DC
  Administered 2023-04-14: 100 mg via ORAL
  Filled 2023-04-13 (×2): qty 2

## 2023-04-13 MED ORDER — ONDANSETRON HCL 4 MG/2ML IJ SOLN: 4.0000 mg | Freq: Three times a day (TID) | INTRAMUSCULAR | Status: DC | PRN

## 2023-04-13 MED ORDER — OXYCODONE HCL 5 MG PO TABS: 5.0000 mg | ORAL_TABLET | Freq: Four times a day (QID) | ORAL | Status: DC | PRN

## 2023-04-13 MED ORDER — ACETAMINOPHEN 325 MG PO TABS: 650.0000 mg | ORAL_TABLET | Freq: Three times a day (TID) | ORAL | Status: DC

## 2023-04-13 MED ORDER — POLYETHYLENE GLYCOL 3350 17 G PO PACK
17.0000 g | PACK | Freq: Every day | ORAL | Status: DC | PRN
  Administered 2023-04-14 – 2023-04-18 (×2): 17 g via ORAL
  Filled 2023-04-13: qty 1

## 2023-04-13 MED ORDER — METOPROLOL TARTRATE 25 MG PO TABS: 12.5000 mg | ORAL_TABLET | Freq: Two times a day (BID) | ORAL | Status: DC

## 2023-04-13 MED ORDER — DOCUSATE SODIUM 50 MG/5ML PO LIQD: 100.0000 mg | Freq: Two times a day (BID) | ORAL | Status: DC

## 2023-04-13 MED ORDER — FREE WATER
200.0000 mL | Freq: Four times a day (QID) | Status: DC
  Administered 2023-04-13 – 2023-04-18 (×17): 200 mL

## 2023-04-13 MED ORDER — BUPROPION HCL ER (XL) 150 MG PO TB24
150.0000 mg | ORAL_TABLET | Freq: Every day | ORAL | Status: DC
  Administered 2023-04-14 – 2023-04-18 (×4): 150 mg via ORAL
  Filled 2023-04-13 (×5): qty 1

## 2023-04-13 MED ORDER — PRAVASTATIN SODIUM 20 MG PO TABS
40.0000 mg | ORAL_TABLET | Freq: Every day | ORAL | Status: DC
  Administered 2023-04-14: 40 mg via ORAL
  Filled 2023-04-13 (×2): qty 2

## 2023-04-13 MED ORDER — OSMOLITE 1.2 CAL PO LIQD
1000.0000 mL | ORAL | Status: DC
  Administered 2023-04-13: 1000 mL

## 2023-04-13 MED ORDER — OYSTER SHELL CALCIUM/D3 500-5 MG-MCG PO TABS
1.0000 | ORAL_TABLET | Freq: Every day | ORAL | Status: DC
  Administered 2023-04-14 – 2023-04-18 (×4): 1 via ORAL
  Filled 2023-04-13 (×5): qty 1

## 2023-04-13 MED ORDER — ACETAMINOPHEN 325 MG PO TABS
325.0000 mg | ORAL_TABLET | ORAL | Status: DC | PRN
Start: 2023-04-13 — End: 2023-05-05

## 2023-04-13 MED ORDER — FREE WATER: 250.0000 mL | Freq: Four times a day (QID) | Status: DC

## 2023-04-13 NOTE — Progress Notes (Signed)
 Physical Therapy Session Note  Patient Details  Name: Rita Taylor MRN: [id number] Date of Birth: [birth year]  Today's Date: 04/13/2023 PT Individual Time: 0931-1030 PT Individual Time Calculation (min): 59 min   Short Term Goals: Week 1:  PT Short Term Goal 1 (Week 1): Pt will complete bed mobility with CGA PT Short Term Goal 2 (Week 1): Pt will complete transfers with min assist and LRAD PT Short Term Goal 3 (Week 1): Pt will ambulate 100' with RW min assist PT Short Term Goal 4 (Week 1): Pt will tolerate OOB for 2 hours outside therapy sessions  Skilled Therapeutic Interventions/Progress Updates: Pt presents semi-reclined in TIS w/ LES bent into > 90/90 position.  THT donned w/ total A.  Pt answering questions appropriately although does close eyes frequently.  Scrub pants threaded over feet up to thighs in semi-reclined.  BP measured at 141/65, HR 86 and then tilted down to sitting position and measured at 128/67, HR 94.  Pt required mod A for sit to stand w/ cues including tactile cues for hand placement.  Total A to compete donning of pants.  Pt unable to remain standing greater than 30 seconds, so unable to get BP reading, but immediately after sitting BP measured at 113/62, HR 99.  Pt states some dizziness, but really unable to explain why she needed to sit.  BP returned to 130/64 w/ sitting position.  Pt wheeled to dayroom for energy and time conservation.  Pt tolerated 2x sit to stand w/ mod A, but unable to remain upright > 30 seconds before "legs tired."  Pt returned to room and remained sitting in TIS, reclined back w/ chair alarm and seat belt on.  Pt received phone call and conversing w/ family member.     Therapy Documentation Precautions:  Precautions Precautions: Cervical, Fall Recall of Precautions/Restrictions: Impaired Precaution/Restrictions Comments: c-collar OOB and with activity, okay to be off to sleep and eat Required Braces or Orthoses: Cervical Brace Cervical  Brace: Hard collar Restrictions Weight Bearing Restrictions Per Provider Order: No General:   Vital Signs:   Pain:0/10 Pain Assessment Pain Scale: Faces Faces Pain Scale: Hurts whole lot Pain Location: Leg Pain Orientation: Left;Right Pain Descriptors / Indicators: Grimacing Pain Frequency: Constant Pain Onset: With Activity    Therapy/Group: Individual Therapy  Lucio Edward 04/13/2023, 10:32 AM

## 2023-04-13 NOTE — Discharge Instructions (Signed)
 Inpatient Rehab Discharge Instructions  GRISELDA BRAMBLETT Discharge date and time: 04/13/23   Activities/Precautions/ Functional Status: Activity: activity as tolerated Diet:  D2, thin liquids  Wound Care: keep wound clean and dry   Functional status:  ___ No restrictions     ___ Walk up steps independently ___ 24/7 supervision/assistance   ___ Walk up steps with assistance ___ Intermittent supervision/assistance  ___ Bathe/dress independently ___ Walk with walker     ___ Bathe/dress with assistance ___ Walk Independently    ___ Shower independently ___ Walk with assistance    ___ Shower with assistance ___ No alcohol     ___ Return to work/school ________  Special Instructions:    My questions have been answered and I understand these instructions. I will adhere to these goals and the provided educational materials after my discharge from the hospital.  Patient/Caregiver Signature _______________________________ Date __________  Clinician Signature _______________________________________ Date __________  Please bring this form and your medication list with you to all your follow-up doctor's appointments.

## 2023-04-13 NOTE — Progress Notes (Signed)
 Patient ID: ANUOLUWAPO MEFFERD, female   DOB: 1940/12/23, 83 y.o.   MRN: [id number]  SW received updates from PA-Pam reporting pt will transition to [facility] for surgery once there is a bed available.   Cecile Sheerer, MSW, LCSW Office: [phone] Cell: [phone] Fax: [phone]

## 2023-04-13 NOTE — Progress Notes (Signed)
 Patient is alert and NAD. She continues to fluctuate. Heart RRR. Lungs are clear. Abdomen SNT, +BS. Neck immobilized with C collar.

## 2023-04-13 NOTE — Progress Notes (Signed)
 Patient arrives to floor, transported by [facility]. Patient alert but not very interactive. Patient answers questions softly and keeps eyes closed most of the time.
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
## 2023-04-13 NOTE — Plan of Care (Signed)
  Problem: RH BOWEL ELIMINATION Goal: RH STG MANAGE BOWEL WITH ASSISTANCE Description: STG Manage Bowel with minimal Assistance. Outcome: Progressing   Problem: RH BLADDER ELIMINATION Goal: RH STG MANAGE BLADDER WITH ASSISTANCE Description: STG Manage Bladder With  minimal Assistance Outcome: Progressing   Problem: RH SKIN INTEGRITY Goal: RH STG SKIN FREE OF INFECTION/BREAKDOWN Description:  Manage skin free of infection/ breakdown with minimal assistance  Outcome: Progressing

## 2023-04-13 NOTE — H&P (Signed)
 History and Physical    [name] NWG:[id number] DOB: [birth year] DOA: 04/13/2023  Referring MD/NP/PA:   PCP: Rita Shams, MD   Patient coming from:  The patient is coming from home.     Chief Complaint: brain mass  HPI: Rita Taylor is a 83 y.o. female with medical history significant of HTN, HLD, diabetes, depression, melanoma, left foot drop, neurofibromatosis type II, recent neck fracture (S/PE of surgery), who presents with brain tumor.  Patient was recently hospitalized due to neck fracture. Patient is s/p of ACDF C5-6 followed by posterior lateral arthrodesis C4-T4, ORIF C6, C7 and T3 fractures on 02/14, on c-collar, who presents with brain tumor.  Patient is currently doing inpatient rehab. Her anterior/posterior neck incisions as well as upper thoracic incisions are healing well.  Patient has severe protein calorie malnutrition. She has very poor p.o. intake and was refusing multiple supplements.  Her family was agreeable to start tube feeds and cortak placed on 02/26.  Per discharge summary from inpatient rehab,  pt has fluctuating mental status. 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  The case was discussed with Rita Taylor who recommended transfer to [facility] for surgical intervention.   When I saw pt on the floor, pt is alert and orientated x 3.  She denies headache.  Moves all extremities.  No facial droop or slurred speech.  She has neck pain, wearing c-collar.  Denies chest pain, cough, SOB.  No nausea, vomiting, diarrhea or abdominal pain.  No symptoms of UTI.  MRI-brain on 04/12/23 1. Postoperative changes from prior left retrosigmoid craniectomy for tumor resection. 4.1 x 2.3 x 2.6 cm residual and/or recurrent mass positioned at the left CP angle cistern extending into the  left internal auditory canal, consistent with a vestibular schwannoma. Associated mass effect on the adjacent brainstem with surrounding vasogenic edema. 2. Lateral and third ventriculomegaly, increased as compared to previous MRIs, consistent with hydrocephalus. Rind of FLAIR hyperintensity surrounding the dilated lateral ventricles likely reflects a degree of transependymal flow of CSF. This is likely due to compression of the cerebral aqueduct by the left CP angle mass. This is suspected to be the symptomatic finding given the provided history. 3. No other acute intracranial abnormality.      Data reviewed independently and ED Course: pt was found to have WBC 12.1, GFR> 60. Temperature normal, blood pressure 157/65, heart rate 94, RR 22, oxygen saturation 94% on room air.  Patient is admitted to PCU as inpatient.  Message sent to Rita Taylor of neurosurgery for consult.   EKG:   Not done in ED, will get one.      Review of Systems:   General: no fevers, chills, no body weight gain, fatigue HEENT: no blurry vision, hearing changes or sore throat. Has neck pain Respiratory: no dyspnea, coughing, wheezing CV: no chest pain, no palpitations GI: no nausea, vomiting, abdominal pain, diarrhea, constipation GU: no dysuria, burning on urination, increased urinary frequency, hematuria  Ext: no leg edema Neuro: no unilateral weakness, numbness, or tingling, no vision change or hearing loss Skin: no rash, no skin tear. MSK: No muscle spasm, no deformity, no limitation of range of movement in spin Heme: No easy bruising.  Travel history: No recent long distant travel.   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[facility])     Past Surgical History:  Procedure Laterality Date   ANTERIOR CERVICAL DECOMP/DISCECTOMY FUSION N/A 04/01/2023   Procedure: ANTERIOR CERVICAL DECOMPRESSION/DISCECTOMY FUSION 1 LEVEL;  Surgeon: Rita Night, MD;  Location: [facility] ORS;  Service: Neurosurgery;  Laterality: N/A;  C5-6 ACDF   APPLICATION OF INTRAOPERATIVE CT SCAN N/A 04/01/2023   Procedure: APPLICATION OF INTRAOPERATIVE CT SCAN;  Surgeon: Rita Night, MD;  Location: [facility] ORS;  Service: Neurosurgery;  Laterality: N/A;   bitubal ligation  1981   BREAST ENHANCEMENT SURGERY  1990   BROW LIFT Left 01/24/2020   Procedure: TARSORRHAPHY, LATERAL PLACEMENT LEFT LOWER LID;  Surgeon: Rita Riches, MD;  Location: [facility];  Service: Ophthalmology;  Laterality: Left;   COLON SURGERY     COLONOSCOPY     ECTROPION REPAIR Left 03/02/2019   Procedure: ECTROPION REPAIR, EXTENSIVE AND TARSORRHAPHY, LATERAL PLACEMENT OF LEFT LOWER LID;  Surgeon: Rita Riches, MD;  Location: [facility];  Service: Ophthalmology;  Laterality: Left;   FACIAL COSMETIC SURGERY     GYNECOLOGIC CRYOSURGERY     15 years ago, normal since then, was treated with antibiotics, Annual pap smears for the past 20 years   POSTERIOR CERVICAL FUSION/FORAMINOTOMY N/A 04/01/2023   Procedure: C4-T4 posterior fusion, ORIF C6, C7, T3 fractures;  Surgeon: Rita Night, MD;  Location: [facility] ORS;  Service: Neurosurgery;  Laterality: N/A;  with Brainlab, [place]   TUMOR EXCISION Left 06/2018   ear    Social History:  reports that she has never smoked. She has never used smokeless tobacco. She reports current alcohol use of about 1.0 standard drink of alcohol per week. She reports that she does not use drugs.  Family History:  Family History  Problem Relation Age of Onset    Hypertension Mother    High Cholesterol Mother    High Cholesterol Father    Hypertension Father    Hypertension Brother      Prior to Admission medications   Medication Sig Start Date End Date Taking? Authorizing Provider  acetaminophen (TYLENOL) 325 MG tablet Take 1-2 tablets (325-650 mg total) by mouth every 4 (four) hours as needed for mild pain (pain score 1-3). 04/13/23   Love, Evlyn Kanner, PA-C  acetaminophen (TYLENOL) 325 MG tablet Take 2 tablets (650 mg total) by mouth every 8 (eight) hours. 04/13/23   Jacquelynn Cree, PA-C  aspirin EC 81 MG tablet Take 81 mg by mouth daily.    [provider]  Biotin w/ Vitamins C & E (HAIR/SKIN/NAILS PO) Take by mouth daily.    [provider]  buPROPion (WELLBUTRIN XL) 150 MG 24 hr tablet Take 1 tablet (150 mg total) by mouth daily. 09/20/22   Rita Shams, MD  Calcium Carbonate-Vit D-Min (CALCIUM 1200) 1200-1000 MG-UNIT CHEW Chew 1 tablet by mouth daily.    [provider]  cyanocobalamin (VITAMIN B12) 1000 MCG tablet Take 1 tablet (1,000 mcg total) by mouth daily. 11/25/21   [name], MD  docusate (COLACE) 50  MG/5ML liquid Take 10 mLs (100 mg total) by mouth 2 (two) times daily. 04/13/23   Love, Evlyn Kanner, PA-C  gabapentin (NEURONTIN) 300 MG capsule Take 1 capsule (300 mg total) by mouth 4 (four) times daily - after meals and at bedtime. 04/13/23   Love, Evlyn Kanner, PA-C  lidocaine (LIDODERM) 5 % Place 2 patches onto the skin daily. Remove & Discard patch within 12 hours or as directed by MD 04/09/23   Enedina Finner, MD  losartan (COZAAR) 100 MG tablet Take 1 tablet (100 mg total) by mouth daily. 04/14/23   Love, Evlyn Kanner, PA-C  metoprolol tartrate (LOPRESSOR) 25 MG tablet Take 0.5 tablets (12.5 mg total) by mouth 2 (two) times daily. 04/13/23   Love, Evlyn Kanner, PA-C  oxyCODONE (OXY IR/ROXICODONE) 5 MG immediate release tablet Take 0.5 tablets (2.5 mg total) by mouth every 6 (six) hours as needed for severe pain (pain score 7-10).  04/13/23   Love, Evlyn Kanner, PA-C  polyethylene glycol (MIRALAX / GLYCOLAX) 17 g packet Take 17 g by mouth daily as needed for mild constipation. 04/08/23   Enedina Finner, MD  pravastatin (PRAVACHOL) 40 MG tablet TAKE 1 TABLET(40 MG) BY MOUTH DAILY 03/21/23   Rita Shams, MD    Physical Exam: There were no vitals filed for this visit. General: Not in acute distress HEENT: Wearing c-collar, has mild neck pain       Eyes: PERRL, EOMI, no jaundice       ENT: No discharge from the ears and nose, no pharynx injection, no tonsillar enlargement.        Neck: No JVD, no bruit, no mass felt. Heme: No neck lymph node enlargement. Cardiac: S1/S2, RRR, No murmurs, No gallops or rubs. Respiratory: No rales, wheezing, rhonchi or rubs. GI: Soft, nondistended, nontender, no rebound pain, no organomegaly, BS present. GU: No hematuria Ext: No pitting leg edema bilaterally. 1+DP/PT pulse bilaterally. Musculoskeletal: No joint deformities, No joint redness or warmth, no limitation of ROM in spin. Skin: No rashes.  Neuro: Alert, oriented X3, cranial nerves II-XII grossly intact, moves all extremities normally. Muscle strength 5/5 in all extremities. Psych: Patient is not psychotic, no suicidal or hemocidal ideation.  Labs on Admission: I have personally reviewed following labs and imaging studies  CBC: Recent Labs  Lab 04/09/23 0949 04/11/23 0637 04/12/23 0529 04/13/23 1922  WBC 12.9* 14.4* 14.1* 12.1*  NEUTROABS 9.4*  --  10.4* 9.1*  HGB 10.8* 11.3* 11.1* 10.6*  HCT 33.2* 35.7* 34.2* 32.9*  MCV 90.7 91.3 90.7 92.9  PLT 471* 551* 533* 548*   Basic Metabolic Panel: Recent Labs  Lab 04/07/23 0512 04/08/23 0539 04/09/23 0949 04/11/23 0637 04/13/23 1922  NA 140 140 137 140 138  K 3.3* 3.8 3.6 4.4 3.9  CL 105 106 100 101 102  CO2 24 23 27 30 25   GLUCOSE 115* 126* 145* 141* 104*  BUN 17 21 16 18 17   CREATININE 0.52 0.46 0.52 0.58 0.52  CALCIUM 7.9* 8.3* 8.7* 9.0 8.4*  PHOS 3.2 2.8  --   --    --    GFR: Estimated Creatinine Clearance: 40.2 mL/min (by C-G formula based on SCr of 0.52 mg/dL). Liver Function Tests: Recent Labs  Lab 04/07/23 0512 04/08/23 0539 04/09/23 0949  AST  --   --  20  ALT  --   --  31  ALKPHOS  --   --  81  BILITOT  --   --  0.6  PROT  --   --  5.8*  ALBUMIN 3.0* 3.0* 2.8*   No results for input(s): "LIPASE", "AMYLASE" in the last 168 hours. No results for input(s): "AMMONIA" in the last 168 hours. Coagulation Profile: Recent Labs  Lab 04/13/23 1922  INR 1.0   Cardiac Enzymes: No results for input(s): "CKTOTAL", "CKMB", "CKMBINDEX", "TROPONINI" in the last 168 hours. BNP (last 3 results) No results for input(s): "PROBNP" in the last 8760 hours. HbA1C: No results for input(s): "HGBA1C" in the last 72 hours. CBG: Recent Labs  Lab 04/07/23 1405  GLUCAP 112*   Lipid Profile: No results for input(s): "CHOL", "HDL", "LDLCALC", "TRIG", "CHOLHDL", "LDLDIRECT" in the last 72 hours. Thyroid Function Tests: No results for input(s): "TSH", "T4TOTAL", "FREET4", "T3FREE", "THYROIDAB" in the last 72 hours. Anemia Panel: No results for input(s): "VITAMINB12", "FOLATE", "FERRITIN", "TIBC", "IRON", "RETICCTPCT" in the last 72 hours. Urine analysis:    Component Value Date/Time   COLORURINE YELLOW 04/11/2023 0854   APPEARANCEUR HAZY (A) 04/11/2023 0854   LABSPEC 1.024 04/11/2023 0854   PHURINE 6.0 04/11/2023 0854   GLUCOSEU NEGATIVE 04/11/2023 0854   HGBUR NEGATIVE 04/11/2023 0854   BILIRUBINUR NEGATIVE 04/11/2023 0854   KETONESUR NEGATIVE 04/11/2023 0854   PROTEINUR NEGATIVE 04/11/2023 0854   NITRITE NEGATIVE 04/11/2023 0854   LEUKOCYTESUR NEGATIVE 04/11/2023 0854   Sepsis Labs: @LABRCNTIP (procalcitonin:4,lacticidven:4) )No results found for this or any previous visit (from the past 240 hours).   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  No focal neurologic deficit now. Consulted Rita Taylor of neurosurgery, planning to place VP shunt on Friday.  -Admitted to PCU as inpatient -10 mg of Decadron, followed by 4 mg 3 times daily -Frequent neurocheck -Fall precaution -INR/PTT/type screen  Hx of fracture of neck (HCC): S/p of surgery.  -Wearing c-collar -As needed oxycodone, Robaxin -Neurontin  Primary hypertension -As needed hydralazine -Cozaar, metoprolol  Hyperlipidemia -Pravastatin  Leukocytosis: WBC 14.1 on 2/25 --> 12.1 today, treating down, no fever, no signs of infection, likely reactive. -Follow-up by CBC  Depression -Wellbutrin  Protein-calorie malnutrition, severe: Body weight 47.8 kg, BMI 19.27 -Tube feeding -Nutrition consult        DVT ppx: SCD  Code Status: Full code     Family Communication:     not done, no family member is at bed side.    Disposition Plan:  Anticipate discharge back to previous environment  Consults called:  Message sent to Rita Taylor of neurosurgery for consult.  Admission status and Level of care: Progressive:   as inpt        Dispo: The patient is from: inpt rehab              Anticipated d/c is to:  to be determined              Anticipated d/c date is: 2 days              Patient currently is not medically stable to d/c.    Severity of Illness:  The appropriate patient status for this patient is INPATIENT. Inpatient status is judged to be reasonable and necessary in order to provide the required  intensity of service to ensure the patient's safety. The patient's presenting symptoms, physical exam findings, and initial radiographic and laboratory data  in the context of their chronic comorbidities is felt to place them at high risk for further clinical deterioration. Furthermore, it is not anticipated that the patient will be medically stable for discharge from the hospital within 2 midnights of admission.   * I certify that at the point of admission it is my clinical judgment that the patient will require inpatient hospital care spanning beyond 2 midnights from the point of admission due to high intensity of service, high risk for further deterioration and high frequency of surveillance required.*       Date of Service 04/13/2023    Lorretta Harp [facility]   If 7PM-7AM, please contact Taylor-coverage [email or web address] 04/13/2023, 8:08 PM

## 2023-04-13 NOTE — Telephone Encounter (Signed)
 I notified Rinaldo Cloud and Dr Myer Haff that images have been loaded.

## 2023-04-13 NOTE — Progress Notes (Signed)
 Speech Language Pathology Daily Session Note  Patient Details  Name: CHERYE GAERTNER MRN: [id number] Date of Birth: 1940/09/03  Today's Date: 04/13/2023 SLP Individual Time: 4098-1191 SLP Individual Time Calculation (min): 43 min  Short Term Goals: Week 1: SLP Short Term Goal 1 (Week 1): Pt will demonstrate improved mastication during trials of D3 diet textures to determine the potential for diet texture upgrade. SLP Short Term Goal 2 (Week 1): Pt will complete functional problem solving tasks with min-mod A when provided visual and/or verbal cues. SLP Short Term Goal 3 (Week 1): Pt will recall functional information in the current environment using compensatory memory strategies when provided mod A.  Skilled Therapeutic Interventions:  Patient was seen in am to address cognitive re- training and dysphagia management. Pt seated upright in TIS WC upon SLP arrival. She was easily alerted and denied pain. Pt agreeable for PO trials of d2 consistency. Pt required assistance with feeding due to pt frequently reaching for items not present and or placing bolus to her chin or cheek with poor self correction. Pt consumed boluses via lateralizing bolus to right side with timely bolus formation and transit. She intermittently consumed sips of thin liquid via straw without s/sx asp. Pt requiring mod encouragement to continue trials before requesting cessation after 4 boluses. RD in and out during session to discuss dietary preferences and assess muscle loss. In other minutes of session, SLP addressed cognition. Pt oriented to month, year, and situation with supervision. She was not oriented to current environment. Pt recalled her address and age indep and DOB and children's names with supervision. SLP challenged pt in problem solving through reasoning compare and contrast task. Pt requiring total A for task. She also participated in a problem solving task to identify problems given a visual scenario. Pt required  max to total A for completion. At conclusion of session pt was left upright in [facility] with call button within reach and chair alarm active. SLP to continue POC.   Pain Pain Assessment Pain Scale: Faces Faces Pain Scale: Hurts whole lot Pain Location: Leg Pain Orientation: Left;Right Pain Descriptors / Indicators: Grimacing Pain Frequency: Constant Pain Onset: With Activity  Therapy/Group: Individual Therapy  Renaee Munda 04/13/2023, 11:21 AM

## 2023-04-13 NOTE — Telephone Encounter (Signed)
 [employer] requested for the following images requested.    Pamala Love PA-C from IP Rehab at [facility] for Dr. Myer Haff to review patient's recent scan verus others for worsening edema, hydrocephalus. Requesting opinion if this is surgical and if the patient should be evaluated by neurosurgery at [facility] or if he will manage her care. Will forward to Dr. Jeannie Fend once images are received.   IMG MRI [place] 01/11/2023: MRI BRAIN W/WO CONTRAST 05/26/2021: MRI BRAIN W/WO CONTRAST 02/18/2021: MRI BRAIN W/WO CONTRAST 11/26/2020: MRI BRAIN W/WO CONTRAST 08/05/2022: MRI BRAIN W/WO CONTRAST  [facility] 11/30/2021: MRI Brain W WO CONTRAST 06/06/2020: MRI Brain Cranial Nerve 8 Protocol (cholesteatoma) W&WO                       CT Temporal Bones Wo Contrast 06/29/2018: MRI BRAIN W/WO CONTRAST   IMG MRI [facility] 09/25/2019: MRI BRAIN W/WO CONTRAST 01/01/2019 MRI BRAIN W/WO CONTRAST  IMG MRI [facility] 01/01/2019: MRI BRAIN W/WO CONTRAST
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
## 2023-04-13 NOTE — Telephone Encounter (Signed)
 We received the following message from patient's daughter via MyChart:  "Thank you! At this point Mom is in rehab at [facility] in [place]."

## 2023-04-13 NOTE — Progress Notes (Signed)
 Occupational Therapy Session Note  Patient Details  Name: Rita Taylor MRN: [id number] Date of Birth: [birth year]  Today's Date: 04/13/2023 OT Individual Time: 0820-0915 OT Individual Time Calculation (min): 55 min    Short Term Goals: Week 1:  OT Short Term Goal 1 (Week 1): Patient to perform toileting with Min assist OT Short Term Goal 2 (Week 1): Patient to perform LE dressing with Min assist OT Short Term Goal 3 (Week 1): Patient to perform bathing with Min assist  Skilled Therapeutic Interventions/Progress Updates:  Pt greeted resting in bed, minimal communication during session, outside of the occasional head nod. Skilled OT session with focus on OOB activity and functional transfers.   Pain: Pt able to state "a lot" when asked to rate pain on "faces" scale. OT offering intermediate rest breaks and positioning suggestions throughout session to address pain/fatigue and maximize participation/safety in session.   Functional Transfers: Pt rolls laterally in bed with Mod A to reach L-side of bed, strong preference to rest on R-side. Supine>sit with A for BLE to initate transfer, Min A for trunk elevation. Total A for donning of cervical collar. Pt performs stand-pivot from EOB>TIS WC with light Mod A, x2 sit<>semi-stands to correct hip alignment in chair with Min-light Mod A.  Self Care Tasks: Pt found with large incontinent bladder void in brief, Max A for pericare and change of garment. HOH provided for participation in anterior pericare. Pt max-total A for donning LB garments including thigh-high TEDs, hospital socks, and scrub pants. Pt does not tolerate items well this session, pulling at material and taking hospital socks off. RN made aware.   Pt remained sitting in TIS WC, posey belt alarmed, TIS WC seatbelt donned. 4Ps assessed and immediate needs met. Pt continues to be appropriate for skilled OT intervention to promote further functional independence in ADLs/IADLs.   Therapy  Documentation Precautions:  Precautions Precautions: Cervical, Fall Recall of Precautions/Restrictions: Impaired Precaution/Restrictions Comments: c-collar OOB and with activity, okay to be off to sleep and eat Required Braces or Orthoses: Cervical Brace Cervical Brace: Hard collar Restrictions Weight Bearing Restrictions Per Provider Order: No   Therapy/Group: Individual Therapy  Lou Cal, OTR/L, MSOT  04/13/2023, 4:57 AM

## 2023-04-13 NOTE — Progress Notes (Signed)
 Speech Language Pathology Daily Session Note  Patient Details  Name: Rita Taylor MRN: [id number] Date of Birth: [birth year]  Today's Date: 04/13/2023 SLP Individual Time: 1400-1420 SLP Individual Time Calculation (min): 20 min and Today's Date: 04/13/2023 SLP Missed Time: 10 Minutes Missed Time Reason: Patient fatigue  Short Term Goals: Week 1: SLP Short Term Goal 1 (Week 1): Pt will demonstrate improved mastication during trials of D3 diet textures to determine the potential for diet texture upgrade. SLP Short Term Goal 2 (Week 1): Pt will complete functional problem solving tasks with min-mod A when provided visual and/or verbal cues. SLP Short Term Goal 3 (Week 1): Pt will recall functional information in the current environment using compensatory memory strategies when provided mod A.  Skilled Therapeutic Interventions:   Pt greeted at bedside. Her childhood friends were visiting her and she was awake talking to them upon SLP arrival. The pt benefited from maxA cues to recall recent events, including therapy goals and reason for CIR stay. Pt's friends brought her some snacks and SLP attempted to encourage PO trials of Dys 3 textures, however, pt politely refused any trials. SLP provided calendar to review orientation information/recent events, though pt unable to remain awake for continued tasks. PA Pam present in the room as tx tasks were ending and she provided education to the pt re transfer to [facility] given hydrocephalus and increasing schwannoma. The pt was left in her bed with the alarm set and call light within reach. -10 mins d/t fatigue. Recommend cont ST per POC.  Pain Pain Assessment Pain Scale: 0-10 Pain Score: 0-No pain  Therapy/Group: Individual Therapy  Pati Gallo 04/13/2023, 4:23 PM

## 2023-04-13 NOTE — Progress Notes (Signed)
 This RN called receiving [facility] nurse Aurea Graff and gave patient report. Patient DC c [facility] transport at this time.

## 2023-04-13 NOTE — Procedures (Signed)
 Cortrak  Person Inserting Tube:  Osa Craver, RD Tube Type:  Cortrak - 43 inches Tube Size:  10 Tube Location:  Left nare Initial Placement:  Stomach Secured by: Bridle Technique Used to Measure Tube Placement:  Marking at nare/corner of mouth Cortrak Secured At:  68 cm   Cortrak Tube Team Note:  Consult received to place a Cortrak feeding tube.   No x-ray is required. RN may begin using tube.   If the tube becomes dislodged please keep the tube and contact the Cortrak team at [email or web address] for replacement.  If after hours and replacement cannot be delayed, place a NG tube and confirm placement with an abdominal x-ray.    Romelle Starcher MS, RDN, LDN, CNSC Registered Dietitian 3 Clinical Nutrition RD Inpatient Contact Info in Amion
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
## 2023-04-13 NOTE — Telephone Encounter (Signed)
 Patient had xrays completed last night at inpatient rehab. They would like for [place] to compare this xray to the one she had completed at [facility] due to the tumor progression they are seeing and they state patient is hydrocephalus.   They would like for [place] to compare and then advise what we would like to do going forward, please review.   Please call [phone] with any questions

## 2023-04-13 NOTE — Telephone Encounter (Signed)
Error

## 2023-04-14 ENCOUNTER — Encounter: Payer: Medicare Other | Admitting: Neurosurgery

## 2023-04-14 DIAGNOSIS — D333 Benign neoplasm of cranial nerves: Secondary | ICD-10-CM | POA: Diagnosis not present

## 2023-04-14 DIAGNOSIS — G918 Other hydrocephalus: Secondary | ICD-10-CM

## 2023-04-14 LAB — GLUCOSE, CAPILLARY
Glucose-Capillary: 127 mg/dL — ABNORMAL HIGH (ref 70–99)
Glucose-Capillary: 156 mg/dL — ABNORMAL HIGH (ref 70–99)
Glucose-Capillary: 160 mg/dL — ABNORMAL HIGH (ref 70–99)
Glucose-Capillary: 201 mg/dL — ABNORMAL HIGH (ref 70–99)
Glucose-Capillary: 220 mg/dL — ABNORMAL HIGH (ref 70–99)

## 2023-04-14 LAB — CBC
HCT: 33.5 % — ABNORMAL LOW (ref 36.0–46.0)
Hemoglobin: 11 g/dL — ABNORMAL LOW (ref 12.0–15.0)
MCH: 29.5 pg (ref 26.0–34.0)
MCHC: 32.8 g/dL (ref 30.0–36.0)
MCV: 89.8 fL (ref 80.0–100.0)
Platelets: 586 10*3/uL — ABNORMAL HIGH (ref 150–400)
RBC: 3.73 MIL/uL — ABNORMAL LOW (ref 3.87–5.11)
RDW: 13.7 % (ref 11.5–15.5)
WBC: 11.7 10*3/uL — ABNORMAL HIGH (ref 4.0–10.5)
nRBC: 0 % (ref 0.0–0.2)

## 2023-04-14 MED ORDER — THIAMINE MONONITRATE 100 MG PO TABS
100.0000 mg | ORAL_TABLET | Freq: Every day | ORAL | Status: DC
  Administered 2023-04-14 – 2023-04-18 (×4): 100 mg
  Filled 2023-04-14 (×5): qty 1

## 2023-04-14 MED ORDER — ADULT MULTIVITAMIN W/MINERALS CH
1.0000 | ORAL_TABLET | Freq: Every day | ORAL | Status: DC
  Administered 2023-04-14 – 2023-04-18 (×4): 1
  Filled 2023-04-14 (×5): qty 1

## 2023-04-14 MED ORDER — OSMOLITE 1.2 CAL PO LIQD
1000.0000 mL | ORAL | Status: DC
  Administered 2023-04-14 – 2023-04-18 (×4): 1000 mL

## 2023-04-14 MED ORDER — ASPIRIN 81 MG PO TBEC: 81.0000 mg | DELAYED_RELEASE_TABLET | Freq: Every day | ORAL | Status: DC

## 2023-04-14 MED ORDER — CHLORHEXIDINE GLUCONATE CLOTH 2 % EX PADS
6.0000 | MEDICATED_PAD | Freq: Every day | CUTANEOUS | Status: DC
  Administered 2023-04-15 – 2023-04-18 (×4): 6 via TOPICAL

## 2023-04-14 NOTE — Plan of Care (Signed)
 Patient was discharged to acute unable to complete CIR program

## 2023-04-14 NOTE — Plan of Care (Signed)
  Problem: Clinical Measurements: Goal: Ability to maintain clinical measurements within normal limits will improve Outcome: Progressing   Problem: Clinical Measurements: Goal: Will remain free from infection Outcome: Progressing   Problem: Clinical Measurements: Goal: Diagnostic test results will improve Outcome: Progressing   Problem: Activity: Goal: Risk for activity intolerance will decrease Outcome: Progressing   

## 2023-04-14 NOTE — Progress Notes (Addendum)
 Inpatient Rehabilitation Admissions Coordinator   Patient readmitted to acute hospital from [facility] for surgical intervention. I will follow her progress to assist with planning possible readmit to CIR postoperatively. Discussed with Dr Shearon Stalls. Please reorder PT and OT when appropriate.  Ottie Glazier, RN, MSN Rehab Admissions Coordinator [phone] 04/14/2023 10:06 AM   I have left a message for her son to contact me to discuss rehab plans moving forward.  Ottie Glazier, RN, MSN Rehab Admissions Coordinator [phone] 04/14/2023 3:13 PM

## 2023-04-14 NOTE — TOC Progression Note (Signed)
 Transition of Care [facility]) - Progression Note    Patient Details  Name: Rita Taylor MRN: [id number] Date of Birth: August 19, 1940  Transition of Care [facility]) CM/SW Contact  Marlowe Sax, RN Phone Number: 04/14/2023, 2:29 PM  Clinical Narrative:     Patient was admitted from CIR to [facility] for NeuroSurgery planning on VP shunt  Hope to return to CIR at DC       Expected Discharge Plan and Services                                               Social Determinants of Health (SDOH) Interventions SDOH Screenings   Food Insecurity: Patient Declined (04/13/2023)  Housing: Low Risk  (03/31/2023)  Transportation Needs: No Transportation Needs (03/31/2023)  Utilities: Not At Risk (03/31/2023)  Depression (PHQ2-9): Low Risk  (11/29/2022)  Financial Resource Strain: Low Risk  (06/03/2022)  Physical Activity: Unknown (06/03/2022)  Social Connections: Unknown (04/13/2023)  Stress: No Stress Concern Present (06/03/2022)  Tobacco Use: Low Risk  (04/08/2023)    Readmission Risk Interventions     No data to display

## 2023-04-14 NOTE — Progress Notes (Signed)
 Physical Therapy Note  Patient Details  Name: Rita Taylor MRN: [id number] Date of Birth: 1940-11-26 Today's Date: 04/14/2023    Physical Therapy Discharge Note  This patient was unable to complete the inpatient rehab program due to unplanned discharge to acute; therefore did not meet their long term goals. Pt left the program at a max assist level for their functional mobility/ transfers. This patient is being discharged from PT services at this time.  Pt's perception of pain in the last five days was unable to answer at this time.    See CareTool for functional status details  If the patient is able to return to inpatient rehabilitation within 3 midnights, this may be considered an interrupted stay and therapy services will resume as ordered. Modification and reinstatement of their goals will be made upon completion of therapy service reevaluations.     Edwin Cap PT, DPT 04/14/2023, 7:55 AM

## 2023-04-14 NOTE — Progress Notes (Signed)
 Speech Language Pathology Note  Patient Details  Name: Rita Taylor MRN: [id number] Date of Birth: [birth year] Today's Date: 04/14/2023                                                                                                                 Speech Therapy Discharge Note  This patient was unable to complete the inpatient rehab program due to unplanned discharge to acute care ; therefore, the patient did not meet their long term goals and has been discharged from skilled SLP services at this time.The patient left the program at a modA assist level for overall cognitive functioning. The patient is currently consuming D2 textures with thin liquids with minA assist for use of swallowing compensatory strategies.   See CareTool for functional status details.  If the patient is able to return to inpatient rehabilitation within 3 midnights, this may be considered an interrupted stay and therapy services will resume as ordered. Modification and reinstatement of their goals will be made upon completion of therapy service reevaluations.     Pati Gallo 04/14/2023, 8:28 AM

## 2023-04-14 NOTE — H&P (View-Only) (Signed)
 Initial Nutrition Assessment  DOCUMENTATION CODES:   Underweight, Severe malnutrition in context of chronic illness  INTERVENTION:   -TF via cortrak:  Continue Osmolite 1.2 @ 50 ml/hr and increase by 10 ml every 8 hours to goal rate of 60 ml/hr.   200 ml free water flush every 6 hours per MD    Tube feeding regimen provides 1728 kcal (100% of needs), 80 grams of protein, and 1181 ml of H2O.  Total free water: 1981  -MVI with minerals daily via tube -100 mg thiamine daily x 7 days via tube -Monitor Mg, K, and Phos and replete as needed secondary to high refeeding risk   NUTRITION DIAGNOSIS:   Severe Malnutrition related to chronic illness as evidenced by severe fat depletion, severe muscle depletion, percent weight loss.  GOAL:   Patient will meet greater than or equal to 90% of their needs  MONITOR:   TF tolerance  REASON FOR ASSESSMENT:   Consult Enteral/tube feeding initiation and management  ASSESSMENT:   Pt with medical history significant of HTN, HLD, diabetes, depression, melanoma, left foot drop, neurofibromatosis type II, recent neck fracture (S/PE of surgery), who presents with brain tumor.  Pt admitted with vestibular schwannoma with vasogenic brain edema.   2/14- s/p Anterior cervical diskectomy and fusion, insertion of biomechanical device 2/15- drain removed secondary to agitation 2/15- advanced to dysphagia 2 diet 2/16- diet downgraded to dysphagia 1 by RN due to safety concerns 2/17- s/p BSE- NPO 2/18- s/p BSE- advanced to dysphagia 2 diet 2/21 - transfer to CIR at [facility] for rehab 2/22 - wound VAC removed 2/26 - Cortrak placed (gastric); txr to [facility]  Per H&P, plan to place VP shunt on 04/15/23.  TF started last night. Noted Osmolite 1.2 infusing at 50 ml/hr.   No family present at time of visit. Pt lying in bed; pt interacted with this RD, however, unable to provide accurate history. Pt smiled and giggled at this RD when spoken to and states that  she feels good today. She denies any abdominal pain.   Noted previous admission at [facility] on 03/30/23. Unable to obtain detailed diet history and noted that intake has been poor since last hospital admission. Per CIR RD notes, pt has mainly been taking in only Ensure and is resistant to all other PO intake.   Reviewed wt hx; pt has experienced a 13.2% wt loss over the past month, which is significant for time frame.   Palliative care consult pending for goals of care discussions.   Medications reviewed and include calcium with vitamin D, vitamin B-12, decadron, and neurontin.   Labs reviewed: K and Phos WDL. Mg: 2.7. CBGS: 88-220 (inpatient orders for glycemic control are none).    NUTRITION - FOCUSED PHYSICAL EXAM:  Flowsheet Row Most Recent Value  Orbital Region Severe depletion  Upper Arm Region Severe depletion  Thoracic and Lumbar Region Severe depletion  Buccal Region Severe depletion  Temple Region Severe depletion  Clavicle Bone Region Severe depletion  Clavicle and Acromion Bone Region Severe depletion  Scapular Bone Region Severe depletion  Dorsal Hand Severe depletion  Patellar Region Severe depletion  Anterior Thigh Region Severe depletion  Posterior Calf Region Severe depletion  Edema (RD Assessment) None  Hair Reviewed  Eyes Reviewed  Mouth Reviewed  Skin Reviewed  Nails Reviewed       Diet Order:   Diet Order     None       EDUCATION NEEDS:   Not appropriate for education  at this time  Skin:  Skin Assessment: Reviewed RN Assessment  Last BM:  04/13/23  Height:   Ht Readings from Last 1 Encounters:  04/08/23 5\' 2"  (1.575 m)    Weight:   Wt Readings from Last 1 Encounters:  04/14/23 44.7 kg    Ideal Body Weight:  50 kg  BMI:  Body mass index is 18.02 kg/m.  Estimated Nutritional Needs:   Kcal:  1600-1800  Protein:  75-90 grams  Fluid:  > 1.6 L    Levada Schilling, RD, LDN, CDCES Registered Dietitian III Certified Diabetes Care and  Education Specialist If unable to reach this RD, please use "RD Inpatient" group chat on secure chat between hours of 8am-4 pm daily
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
## 2023-04-14 NOTE — Progress Notes (Signed)
 [facility] Progress Note  Patient: Rita Taylor    ZOX:[id number]  DOA: 04/13/2023     Date of Service: the patient was seen and examined on 04/14/2023  No chief complaint on file.  Brief hospital course: Rita Taylor is a 83 y.o. female with medical history significant of HTN, HLD, diabetes, depression, melanoma, left foot drop, neurofibromatosis type II, recent neck fracture (S/PE of surgery), who presents with brain tumor.   Patient was recently hospitalized due to neck fracture. Patient is s/p of ACDF C5-6 followed by posterior lateral arthrodesis C4-T4, ORIF C6, C7 and T3 fractures on 02/14, on c-collar, who presents with brain tumor.  Patient is currently doing inpatient rehab. Her anterior/posterior neck incisions as well as upper thoracic incisions are healing well.  Patient has severe protein calorie malnutrition. She has very poor p.o. intake and was refusing multiple supplements.  Her family was agreeable to start tube feeds and cortak placed on 02/26.   Per discharge summary from inpatient rehab,  pt has fluctuating mental status. MRI of brain is done per Dr. Osborne Oman recommendation, which showed hydrocephalus with progression of residual tumor,  mass effect on brain stem, with surrounding vasogenic edema, due to possible vestibular schwannoma.  The case was discussed with Dr. Marcell Barlow who recommended transfer to [facility] for surgical intervention.    When I saw pt on the floor, pt is alert and orientated x 3.  She denies headache.  Moves all extremities.  No facial droop or slurred speech.  She has neck pain, wearing c-collar.  Denies chest pain, cough, SOB.  No nausea, vomiting, diarrhea or abdominal pain.  No symptoms of UTI.   MRI-brain on 04/12/23 1. Postoperative changes from prior left retrosigmoid craniectomy for tumor resection. 4.1 x 2.3 x 2.6 cm residual and/or recurrent mass positioned at the left CP angle cistern extending into the left internal auditory  canal, consistent with a vestibular schwannoma. Associated mass effect on the adjacent brainstem with surrounding vasogenic edema. 2. Lateral and third ventriculomegaly, increased as compared to previous MRIs, consistent with hydrocephalus. Rind of FLAIR hyperintensity surrounding the dilated lateral ventricles likely reflects a degree of transependymal flow of CSF. This is likely due to compression of the cerebral aqueduct by the left CP angle mass. This is suspected to be the symptomatic finding given the provided history. 3. No other acute intracranial abnormality.       Data reviewed independently and ED Course: pt was found to have WBC 12.1, GFR> 60. Temperature normal, blood pressure 157/65, heart rate 94, RR 22, oxygen saturation 94% on room air.  Patient is admitted to PCU as inpatient.  Message sent to Dr. Katrinka Blazing of neurosurgery for consult.   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  No focal neurologic deficit now.  -10 mg of Decadron, followed by 4 mg 3 times daily -Frequent neurocheck -Fall precaution -INR/PTT/type screen Neurosurgery consulted, keep n.p.o. after midnight.  Discontinued aspirin.  Patient is scheduled for VP shunt placement tomorrow a.m.   Hx of fracture of neck: S/p of surgery.  -Wearing c-collar -As needed oxycodone, Robaxin -Neurontin   Primary hypertension -As needed hydralazine -Cozaar, metoprolol   Hyperlipidemia -Pravastatin   Leukocytosis: WBC 14.1 on 2/25 --> 12.1 today, treating down, no fever, no signs of infection, likely reactive. -Follow-up by CBC   Depression -Wellbutrin   Protein-calorie malnutrition, severe: Body weight 47.8 kg,  BMI 19.27 -Tube feeding -Nutrition consult    Body mass index is 18.02 kg/m.  Nutrition Problem: Severe Malnutrition Etiology: chronic illness Interventions: Interventions:  Tube feeding   Diet: NG tube feeding DVT Prophylaxis: SCDs  Advance goals of care discussion: Full code  Family Communication: family was not present at bedside, at the time of interview.  The pt provided permission to discuss medical plan with the family. Opportunity was given to ask question and all questions were answered satisfactorily.   Disposition:  Pt is from SNF, admitted with AMS, vestibular schwannoma, scheduled for VP shunt insertion on 2/28, which precludes a safe discharge. Discharge to SNF, when cleared by neurosurgery.  Subjective: No significant events overnight, patient was resting comfortably, denied any complaints.   Physical Exam: General: NAD, lying comfortably Appear in no distress, affect appropriate Eyes: PERRLA ENT: Oral Mucosa Clear, moist, neck collar intact Neck: no JVD,  Cardiovascular: S1 and S2 Present, no Murmur,  Respiratory: good respiratory effort, Bilateral Air entry equal and Decreased, no Crackles, no wheezes Abdomen: Bowel Sound present, Soft and no tenderness,  Skin: no rashes Extremities: no Pedal edema, no calf tenderness Neurologic: without any new focal findings Gait not checked due to patient safety concerns  Vitals:   04/13/23 2316 04/14/23 0431 04/14/23 0900 04/14/23 1500  BP: (!) 140/92  (!) 149/76 (!) 152/66  Pulse: 98  87 84  Resp: 18  18 18   Temp: 98.3 F (36.8 C)  98 F (36.7 C) 98.7 F (37.1 C)  TempSrc: Oral  Oral Oral  SpO2: 98%  98% 98%  Weight:  44.7 kg     No intake or output data in the 24 hours ending 04/14/23 1617 Filed Weights   04/14/23 0431  Weight: 44.7 kg    Data Reviewed: I have personally reviewed and interpreted daily labs, tele strips, imagings as discussed above. I reviewed all nursing notes, pharmacy notes, vitals, pertinent old records I have discussed plan of care as described above with RN and patient/family.  CBC: Recent Labs  Lab 04/09/23 0949 04/11/23 0637 04/12/23 0529  04/13/23 1922 04/14/23 0504  WBC 12.9* 14.4* 14.1* 12.1* 11.7*  NEUTROABS 9.4*  --  10.4* 9.1*  --   HGB 10.8* 11.3* 11.1* 10.6* 11.0*  HCT 33.2* 35.7* 34.2* 32.9* 33.5*  MCV 90.7 91.3 90.7 92.9 89.8  PLT 471* 551* 533* 548* 586*   Basic Metabolic Panel: Recent Labs  Lab 04/08/23 0539 04/09/23 0949 04/11/23 0637 04/13/23 1922  NA 140 137 140 138  K 3.8 3.6 4.4 3.9  CL 106 100 101 102  CO2 23 27 30 25   GLUCOSE 126* 145* 141* 104*  BUN 21 16 18 17   CREATININE 0.46 0.52 0.58 0.52  CALCIUM 8.3* 8.7* 9.0 8.4*  PHOS 2.8  --   --   --     Studies: No results found.  Scheduled Meds:  buPROPion  150 mg Oral Daily   calcium-vitamin D  1 tablet Oral Daily   [START ON 04/15/2023] Chlorhexidine Gluconate Cloth  6 each Topical Q0600   cyanocobalamin  1,000 mcg Oral Daily   dexamethasone (DECADRON) injection  4 mg Intravenous TID   free water  200 mL Per Tube Q6H   gabapentin  300 mg Oral TID PC & HS   losartan  100 mg Oral Daily   metoprolol tartrate  12.5 mg Oral BID   multivitamin with minerals  1 tablet Per Tube Daily   pravastatin  40 mg Oral Daily  thiamine  100 mg Per Tube Daily   Continuous Infusions:  feeding supplement (OSMOLITE 1.2 CAL) 1,000 mL (04/14/23 1410)   PRN Meds: acetaminophen (TYLENOL) oral liquid 160 mg/5 mL, methocarbamol, ondansetron (ZOFRAN) IV, oxyCODONE, polyethylene glycol  Time spent: 55 minutes  Author: Gillis Santa. MD [facility] 04/14/2023 4:17 PM  To reach On-call, see care teams to locate the attending and reach out to them via [email or web address]. If 7PM-7AM, please contact night-coverage If you still have difficulty reaching the attending provider, please page the [facility] (Director on Call) for [facility] on amion for assistance.

## 2023-04-14 NOTE — Consult Note (Addendum)
 I have reviewed Dr. Michaelle Copas note.  Mrs. 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 Due to worsening mental status, I recommended shunt placement.    Dr. Everlene Farrier will assist for peritoneal access.   I spoke with Mrs. Rita Taylor's daughter.  I discussed the risks and benefits of surgery including bleeding, infection, medical risks up to including coma, paralysis, and death.  I also discussed the chances of lack of improvement.  She expressed understanding and asked that we proceed.

## 2023-04-14 NOTE — Consult Note (Signed)
 Consulting Department:  Inpatient medicine  Primary Physician:  [name], MD  Chief Complaint: Progressive hydrocephalus  History of Present Illness: 04/14/2023 Rita Taylor is a 83 y.o. female who presents with the chief complaint of progressive hydrocephalus.  She has a history of a recent cervical spine injury and has had cervical fixation.  She continues to be in a collar.  She was recently being watched and found to have worsening symptoms and signs of hydrocephalus.  She has a known posterior fossa mass with some compression and facial weakness.  She is having worsening headaches, on evaluation was found to have slow progression of her hydrocephalus.  She is here today being transferred in for further evaluation.  She does not state that she has any nausea or vomiting but does feel like her headaches are getting worse.  Review of Systems:  A 10 point review of systems is negative, except for the pertinent positives and negatives detailed in the HPI.  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[facility])     Past Surgical History: Past Surgical History:  Procedure Laterality Date   ANTERIOR CERVICAL DECOMP/DISCECTOMY FUSION N/A 04/01/2023   Procedure: ANTERIOR CERVICAL DECOMPRESSION/DISCECTOMY FUSION 1 LEVEL;  Surgeon: Venetia Night, MD;  Location: [facility] ORS;  Service: Neurosurgery;  Laterality: N/A;  C5-6 ACDF   APPLICATION OF INTRAOPERATIVE CT SCAN N/A 04/01/2023   Procedure: APPLICATION OF INTRAOPERATIVE CT SCAN;  Surgeon: Venetia Night, MD;  Location: [facility] ORS;  Service: Neurosurgery;  Laterality: N/A;   bitubal ligation  1981   BREAST ENHANCEMENT SURGERY  1990   BROW LIFT Left 01/24/2020   Procedure: TARSORRHAPHY,  LATERAL PLACEMENT LEFT LOWER LID;  Surgeon: Imagene Riches, MD;  Location: [facility];  Service: Ophthalmology;  Laterality: Left;   COLON SURGERY     COLONOSCOPY     ECTROPION REPAIR Left 03/02/2019   Procedure: ECTROPION REPAIR, EXTENSIVE AND TARSORRHAPHY, LATERAL PLACEMENT OF LEFT LOWER LID;  Surgeon: Imagene Riches, MD;  Location: [facility];  Service: Ophthalmology;  Laterality: Left;   FACIAL COSMETIC SURGERY     GYNECOLOGIC CRYOSURGERY     15 years ago, normal since then, was treated with antibiotics, Annual pap smears for the past 20 years   POSTERIOR CERVICAL FUSION/FORAMINOTOMY N/A 04/01/2023   Procedure: C4-T4 posterior fusion, ORIF C6, C7, T3 fractures;  Surgeon: Venetia Night, MD;  Location: [facility] ORS;  Service: Neurosurgery;  Laterality: N/A;  with Brainlab, [place]   TUMOR EXCISION Left 06/2018   ear    Allergies: Allergies as of 04/13/2023 - Review Complete 04/13/2023  Allergen Reaction Noted   Dextromethorphan-guaifenesin Other (See Comments) 11/25/2021   Fosamax [alendronate]  07/03/2012   [id number]     Risedronate sodium  10/31/2010    Medications:  Current Facility-Administered Medications:    acetaminophen (TYLENOL) 160 MG/5ML solution 650 mg, 650 mg, Oral, Q6H PRN, Lorretta Harp, MD   [START ON 04/15/2023] aspirin EC tablet 81 mg, 81 mg, Oral, Daily, Lucianne Muss, Dileep, MD   buPROPion (WELLBUTRIN XL) 24 hr tablet 150 mg, 150 mg, Oral, Daily, Lorretta Harp, MD   calcium-vitamin D (OSCAL WITH D) 500-5 MG-MCG per tablet 1 tablet, 1 tablet, Oral, Daily, Lorretta Harp, MD  cyanocobalamin (VITAMIN B12) tablet 1,000 mcg, 1,000 mcg, Oral, Daily, Lorretta Harp, MD   dexamethasone (DECADRON) injection 4 mg, 4 mg, Intravenous, TID, Lorretta Harp, MD   feeding supplement (OSMOLITE 1.2 CAL) liquid 1,000 mL, 1,000 mL, Per Tube, Continuous, Lorretta Harp, MD, Last Rate: 50 mL/hr at 04/13/23 2145, 1,000 mL at 04/13/23 2145   free water 200 mL, 200 mL, Per Tube, Q6H, Lorretta Harp, MD, 200 mL at 04/14/23 0144   gabapentin (NEURONTIN) capsule 300 mg, 300 mg, Oral, TID PC & HS, Lorretta Harp, MD, 300 mg at 04/13/23 2247   losartan (COZAAR) tablet 100 mg, 100 mg, Oral, Daily, Gillis Santa, MD   methocarbamol (ROBAXIN) tablet 500 mg, 500 mg, Per Tube, Q8H PRN, Lorretta Harp, MD   metoprolol tartrate (LOPRESSOR) tablet 12.5 mg, 12.5 mg, Oral, BID, Gillis Santa, MD, 12.5 mg at 04/13/23 2247   ondansetron (ZOFRAN) injection 4 mg, 4 mg, Intravenous, Q8H PRN, Lorretta Harp, MD   oxyCODONE (Oxy IR/ROXICODONE) immediate release tablet 5 mg, 5 mg, Per Tube, Q6H PRN, Lorretta Harp, MD   polyethylene glycol (MIRALAX / GLYCOLAX) packet 17 g, 17 g, Oral, Daily PRN, Lorretta Harp, MD   pravastatin (PRAVACHOL) tablet 40 mg, 40 mg, Oral, Daily, Lorretta Harp, MD   Social History: Social History   Tobacco Use   Smoking status: Never   Smokeless tobacco: Never  Vaping Use   Vaping status: Never Used  Substance Use Topics   Alcohol use: Yes    Alcohol/week: 1.0 standard drink of alcohol    Types: 1 Glasses of wine per week    Comment: moderate half a glass daily   Drug use: No    Family Medical History: Family History  Problem Relation Age of Onset   Hypertension Mother    High Cholesterol Mother    High Cholesterol Father    Hypertension Father    Hypertension Brother     Physical Examination: Vitals:   04/13/23 2316  BP: (!) 140/92  Pulse: 98  Resp: 18  Temp: 98.3 F (36.8 C)  SpO2: 98%     General: Patient is well developed, well nourished, calm, collected, and in no apparent distress.  NEUROLOGICAL:  General: In no acute distress.   She is oriented x 3.  She does have a headache.  She has a slight upgaze restriction.  She has a left cranial nerve VI palsy, left face is lower motor neuron.  She is able to move all 4 extremities.  She is somewhat distractible and disinhibited on examination.  GCS: Eyes open to voice, follows commands, some confusion, GCS  14   Bilateral upper and lower extremity sensation is intact to light touch.  Imaging: MR BRAIN WO CONTRAST Result Date: 04/13/2023 CLINICAL DATA:  Initial evaluation for new or progressive neuro deficits, cognitive deficits, history of traumatic brain injury. EXAM: MRI HEAD WITHOUT CONTRAST TECHNIQUE: Multiplanar, multiecho pulse sequences of the brain and surrounding structures were obtained without intravenous contrast. COMPARISON:  Comparison made with prior CT from 03/30/2023 as well as outside MRI from 03/24/2023 FINDINGS: Brain: Examination degraded by motion artifact. Cerebral volume within normal limits. No significant cerebral white matter disease for age. No abnormal foci of restricted diffusion to suggest acute or subacute ischemia. No areas of chronic cortical infarction. No acute intracranial hemorrhage. Postoperative changes from prior left retrosigmoid craniectomy with fat graft placement for tumor resection. Residual and/or recurrent mass positioned at the left CP angle cistern extending into the left internal auditory canal measures  4.1 x 2.3 x 2.6 cm, consistent with a vestibular schwannoma (series 8, image 13). Scattered foci of internal susceptibility artifact consistent with blood products and/or necrosis. Associated mass effect on the adjacent brainstem which is compressed and deviated towards the right. Associated vasogenic edema within the adjacent pons, left middle cerebellar peduncle, and left cerebellar hemisphere (series 6, image 6). Lateral and third ventriculomegaly is seen, stable from most recent CT, but increased as compared to previous MRIs, most recent of which consists of an outpatient MRI from 11/30/2021. Rind of FLAIR hyperintensity surrounding the dilated lateral ventricles suspected to reflect a degree of transependymal flow of CSF. This is likely due to compression of the cerebral aqueduct by the left CP angle mass. Fourth ventricle is somewhat deformed but remains  patent. No other mass lesion or mass effect. No extra-axial fluid collection. Pituitary gland and suprasellar region not well assessed on this motion degraded exam. Vascular: Major intracranial vascular flow voids are maintained. Skull and upper cervical spine: Craniocervical junction within normal limits. Bone marrow signal intensity grossly normal. No acute scalp soft tissue abnormality. Sinuses/Orbits: Prior bilateral ocular lens replacement. Paranasal sinuses are largely clear. Right mastoid air cells are clear. Postoperative changes about the left mastoid region. Other: None. IMPRESSION: 1. Postoperative changes from prior left retrosigmoid craniectomy for tumor resection. 4.1 x 2.3 x 2.6 cm residual and/or recurrent mass positioned at the left CP angle cistern extending into the left internal auditory canal, consistent with a vestibular schwannoma. Associated mass effect on the adjacent brainstem with surrounding vasogenic edema. 2. Lateral and third ventriculomegaly, increased as compared to previous MRIs, consistent with hydrocephalus. Rind of FLAIR hyperintensity surrounding the dilated lateral ventricles likely reflects a degree of transependymal flow of CSF. This is likely due to compression of the cerebral aqueduct by the left CP angle mass. This is suspected to be the symptomatic finding given the provided history. 3. No other acute intracranial abnormality. Electronically Signed   By: Rise Mu M.D.   On: 04/13/2023 05:36     I have personally reviewed the images and agree with the above interpretation.  Labs:    Latest Ref Rng & Units 04/14/2023    5:04 AM 04/13/2023    7:22 PM 04/12/2023    5:29 AM  CBC  WBC 4.0 - 10.5 K/uL 11.7  12.1  14.1   Hemoglobin 12.0 - 15.0 g/dL 16.1  09.6  04.5   Hematocrit 36.0 - 46.0 % 33.5  32.9  34.2   Platelets 150 - 400 K/uL 586  548  533       Latest Ref Rng & Units 04/13/2023    7:22 PM 04/11/2023    6:37 AM 04/09/2023    9:49 AM  BMP   Glucose 70 - 99 mg/dL 409  811  914   BUN 8 - 23 mg/dL 17  18  16    Creatinine 0.44 - 1.00 mg/dL 7.82  9.56  2.13   Sodium 135 - 145 mmol/L 138  140  137   Potassium 3.5 - 5.1 mmol/L 3.9  4.4  3.6   Chloride 98 - 111 mmol/L 102  101  100   CO2 22 - 32 mmol/L 25  30  27    Calcium 8.9 - 10.3 mg/dL 8.4  9.0  8.7     INR  1.0 (02/26 1922)   Assessment and Plan: Ms. Newhouse is a pleasant 83 y.o. female with history of a CP angle tumor, progressive hydrocephalus.  She has  been admitted here for progressive hydrocephalus.  She has a history of a cervical spinal injury with a fixation recently.  Unfortunately has developed progressive symptoms of hydrocephalus.  She has dilating ventricles as well as FLAIR signal changes around her ventricular system demonstrating transependymal flow.  She likely has aqueductal compression.  There does appear to be a potential right sided DVA on some of the imaging.  At this point she does appear to be symptomatic from hydrocephalus with headaches, some upgaze restriction, and possible 6th nerve palsy although the VI nerve palsy could be secondary to direct compression from the posterior fossa mass.  Will discuss this case with Dr. Marcell Barlow.  Preemptively planning for ventriculoperitoneal shunt during this hospitalization.  Please hold any antiplatelet medications and anticoagulation at this time.  She will need to be n.p.o. at midnight for potential surgery tomorrow morning.  Lovenia Kim, MD/MSCR Dept. of Neurosurgery

## 2023-04-14 NOTE — Progress Notes (Signed)
 Initial Nutrition Assessment  DOCUMENTATION CODES:   Underweight, Severe malnutrition in context of chronic illness  INTERVENTION:   -TF via cortrak:  Continue Osmolite 1.2 @ 50 ml/hr and increase by 10 ml every 8 hours to goal rate of 60 ml/hr.   200 ml free water flush every 6 hours per MD    Tube feeding regimen provides 1728 kcal (100% of needs), 80 grams of protein, and 1181 ml of H2O.  Total free water: 1981  -MVI with minerals daily via tube -100 mg thiamine daily x 7 days via tube -Monitor Mg, K, and Phos and replete as needed secondary to high refeeding risk   NUTRITION DIAGNOSIS:   Severe Malnutrition related to chronic illness as evidenced by severe fat depletion, severe muscle depletion, percent weight loss.  GOAL:   Patient will meet greater than or equal to 90% of their needs  MONITOR:   TF tolerance  REASON FOR ASSESSMENT:   Consult Enteral/tube feeding initiation and management  ASSESSMENT:   Pt with medical history significant of HTN, HLD, diabetes, depression, melanoma, left foot drop, neurofibromatosis type II, recent neck fracture (S/PE of surgery), who presents with brain tumor.  Pt admitted with vestibular schwannoma with vasogenic brain edema.   2/14- s/p Anterior cervical diskectomy and fusion, insertion of biomechanical device 2/15- drain removed secondary to agitation 2/15- advanced to dysphagia 2 diet 2/16- diet downgraded to dysphagia 1 by RN due to safety concerns 2/17- s/p BSE- NPO 2/18- s/p BSE- advanced to dysphagia 2 diet 2/21 - transfer to CIR at [facility] for rehab 2/22 - wound VAC removed 2/26 - Cortrak placed (gastric); txr to [facility]  Per H&P, plan to place VP shunt on 04/15/23.  TF started last night. Noted Osmolite 1.2 infusing at 50 ml/hr.   No family present at time of visit. Pt lying in bed; pt interacted with this RD, however, unable to provide accurate history. Pt smiled and giggled at this RD when spoken to and states that  she feels good today. She denies any abdominal pain.   Noted previous admission at [facility] on 03/30/23. Unable to obtain detailed diet history and noted that intake has been poor since last hospital admission. Per CIR RD notes, pt has mainly been taking in only Ensure and is resistant to all other PO intake.   Reviewed wt hx; pt has experienced a 13.2% wt loss over the past month, which is significant for time frame.   Palliative care consult pending for goals of care discussions.   Medications reviewed and include calcium with vitamin D, vitamin B-12, decadron, and neurontin.   Labs reviewed: K and Phos WDL. Mg: 2.7. CBGS: 88-220 (inpatient orders for glycemic control are none).    NUTRITION - FOCUSED PHYSICAL EXAM:  Flowsheet Row Most Recent Value  Orbital Region Severe depletion  Upper Arm Region Severe depletion  Thoracic and Lumbar Region Severe depletion  Buccal Region Severe depletion  Temple Region Severe depletion  Clavicle Bone Region Severe depletion  Clavicle and Acromion Bone Region Severe depletion  Scapular Bone Region Severe depletion  Dorsal Hand Severe depletion  Patellar Region Severe depletion  Anterior Thigh Region Severe depletion  Posterior Calf Region Severe depletion  Edema (RD Assessment) None  Hair Reviewed  Eyes Reviewed  Mouth Reviewed  Skin Reviewed  Nails Reviewed       Diet Order:   Diet Order     None       EDUCATION NEEDS:   Not appropriate for education  at this time  Skin:  Skin Assessment: Reviewed RN Assessment  Last BM:  04/13/23  Height:   Ht Readings from Last 1 Encounters:  04/08/23 5\' 2"  (1.575 m)    Weight:   Wt Readings from Last 1 Encounters:  04/14/23 44.7 kg    Ideal Body Weight:  50 kg  BMI:  Body mass index is 18.02 kg/m.  Estimated Nutritional Needs:   Kcal:  1600-1800  Protein:  75-90 grams  Fluid:  > 1.6 L    Levada Schilling, RD, LDN, CDCES Registered Dietitian III Certified Diabetes Care and  Education Specialist If unable to reach this RD, please use "RD Inpatient" group chat on secure chat between hours of 8am-4 pm daily

## 2023-04-14 NOTE — Plan of Care (Signed)

## 2023-04-15 ENCOUNTER — Inpatient Hospital Stay: Payer: Medicare Other | Admitting: Certified Registered"

## 2023-04-15 ENCOUNTER — Other Ambulatory Visit: Payer: Self-pay

## 2023-04-15 ENCOUNTER — Encounter
Admission: AD | Disposition: A | Discharge status: Rehabilitation Facility | Payer: Self-pay | Source: Other Acute Inpatient Hospital | Attending: Student

## 2023-04-15 ENCOUNTER — Encounter: Payer: Self-pay | Admitting: Internal Medicine

## 2023-04-15 DIAGNOSIS — D333 Benign neoplasm of cranial nerves: Secondary | ICD-10-CM | POA: Diagnosis not present

## 2023-04-15 DIAGNOSIS — G911 Obstructive hydrocephalus: Secondary | ICD-10-CM

## 2023-04-15 HISTORY — PX: VENTRICULOPERITONEAL SHUNT: SHX204

## 2023-04-15 LAB — GLUCOSE, CAPILLARY
Glucose-Capillary: 110 mg/dL — ABNORMAL HIGH (ref 70–99)
Glucose-Capillary: 122 mg/dL — ABNORMAL HIGH (ref 70–99)
Glucose-Capillary: 131 mg/dL — ABNORMAL HIGH (ref 70–99)
Glucose-Capillary: 147 mg/dL — ABNORMAL HIGH (ref 70–99)
Glucose-Capillary: 153 mg/dL — ABNORMAL HIGH (ref 70–99)
Glucose-Capillary: 156 mg/dL — ABNORMAL HIGH (ref 70–99)
Glucose-Capillary: 163 mg/dL — ABNORMAL HIGH (ref 70–99)

## 2023-04-15 LAB — CBC
HCT: 32.6 % — ABNORMAL LOW (ref 36.0–46.0)
Hemoglobin: 10.9 g/dL — ABNORMAL LOW (ref 12.0–15.0)
MCH: 29.9 pg (ref 26.0–34.0)
MCHC: 33.4 g/dL (ref 30.0–36.0)
MCV: 89.3 fL (ref 80.0–100.0)
Platelets: 647 10*3/uL — ABNORMAL HIGH (ref 150–400)
RBC: 3.65 MIL/uL — ABNORMAL LOW (ref 3.87–5.11)
RDW: 13.8 % (ref 11.5–15.5)
WBC: 13.7 10*3/uL — ABNORMAL HIGH (ref 4.0–10.5)
nRBC: 0 % (ref 0.0–0.2)

## 2023-04-15 LAB — BASIC METABOLIC PANEL
Anion gap: 8 (ref 5–15)
BUN: 16 mg/dL (ref 8–23)
CO2: 24 mmol/L (ref 22–32)
Calcium: 8.8 mg/dL — ABNORMAL LOW (ref 8.9–10.3)
Chloride: 101 mmol/L (ref 98–111)
Creatinine, Ser: 0.48 mg/dL (ref 0.44–1.00)
GFR, Estimated: >60 mL/min (ref >60)
Glucose, Bld: 152 mg/dL — ABNORMAL HIGH (ref 70–99)
Potassium: 4.3 mmol/L (ref 3.5–5.1)
Sodium: 133 mmol/L — ABNORMAL LOW (ref 135–145)

## 2023-04-15 LAB — PROCALCITONIN: Procalcitonin: <0.1 ng/mL

## 2023-04-15 LAB — MAGNESIUM: Magnesium: 2.3 mg/dL (ref 1.7–2.4)

## 2023-04-15 LAB — PHOSPHORUS: Phosphorus: 2.8 mg/dL (ref 2.5–4.6)

## 2023-04-15 SURGERY — SHUNT INSERTION VENTRICULAR-PERITONEAL
Anesthesia: General | Laterality: Right

## 2023-04-15 MED ORDER — BUPIVACAINE LIPOSOME 1.3 % IJ SUSP
INTRAMUSCULAR | Status: DC | PRN
  Administered 2023-04-15: 50 mL

## 2023-04-15 MED ORDER — PHENYLEPHRINE HCL-NACL 20-0.9 MG/250ML-% IV SOLN
INTRAVENOUS | Status: DC | PRN
  Administered 2023-04-15: 20 ug/min via INTRAVENOUS

## 2023-04-15 MED ORDER — ACETAMINOPHEN 10 MG/ML IV SOLN
INTRAVENOUS | Status: DC | PRN
  Administered 2023-04-15: 645 mg via INTRAVENOUS

## 2023-04-15 MED ORDER — FENTANYL CITRATE (PF) 100 MCG/2ML IJ SOLN
INTRAMUSCULAR | Status: DC | PRN
  Administered 2023-04-15 (×2): 50 ug via INTRAVENOUS

## 2023-04-15 MED ORDER — PHENYLEPHRINE 80 MCG/ML (10ML) SYRINGE FOR IV PUSH (FOR BLOOD PRESSURE SUPPORT)
PREFILLED_SYRINGE | INTRAVENOUS | Status: DC | PRN
  Administered 2023-04-15 (×3): 80 ug via INTRAVENOUS
  Administered 2023-04-15: 160 ug via INTRAVENOUS
  Administered 2023-04-15 (×2): 80 ug via INTRAVENOUS

## 2023-04-15 MED ORDER — ONDANSETRON HCL 4 MG/2ML IJ SOLN
INTRAMUSCULAR | Status: DC | PRN
  Administered 2023-04-15: 4 mg via INTRAVENOUS

## 2023-04-15 MED ORDER — ROCURONIUM BROMIDE 100 MG/10ML IV SOLN
INTRAVENOUS | Status: DC | PRN
  Administered 2023-04-15: 60 mg via INTRAVENOUS

## 2023-04-15 MED ORDER — ACETAMINOPHEN 10 MG/ML IV SOLN
INTRAVENOUS | Status: AC
  Filled 2023-04-15: qty 100

## 2023-04-15 MED ORDER — LIDOCAINE-EPINEPHRINE 1 %-1:100000 IJ SOLN
INTRAMUSCULAR | Status: AC
  Filled 2023-04-15: qty 2

## 2023-04-15 MED ORDER — PROPOFOL 10 MG/ML IV BOLUS
INTRAVENOUS | Status: AC
  Filled 2023-04-15: qty 20

## 2023-04-15 MED ORDER — LACTATED RINGERS IV SOLN
INTRAVENOUS | Status: DC | PRN
Start: 2023-04-15 — End: 2023-04-15

## 2023-04-15 MED ORDER — BUPIVACAINE LIPOSOME 1.3 % IJ SUSP
INTRAMUSCULAR | Status: AC
  Filled 2023-04-15: qty 20

## 2023-04-15 MED ORDER — GABAPENTIN 250 MG/5ML PO SOLN
300.0000 mg | Freq: Three times a day (TID) | ORAL | Status: DC
  Administered 2023-04-15 – 2023-04-18 (×9): 300 mg
  Filled 2023-04-15 (×15): qty 6

## 2023-04-15 MED ORDER — VITAMIN B-12 1000 MCG PO TABS
1000.0000 ug | ORAL_TABLET | Freq: Every day | ORAL | Status: DC
  Administered 2023-04-16 – 2023-04-18 (×3): 1000 ug
  Filled 2023-04-15 (×3): qty 1

## 2023-04-15 MED ORDER — DEXAMETHASONE SODIUM PHOSPHATE 10 MG/ML IJ SOLN
INTRAMUSCULAR | Status: AC
  Filled 2023-04-15: qty 1

## 2023-04-15 MED ORDER — SUGAMMADEX SODIUM 200 MG/2ML IV SOLN
INTRAVENOUS | Status: DC | PRN
  Administered 2023-04-15: 200 mg via INTRAVENOUS

## 2023-04-15 MED ORDER — PRAVASTATIN SODIUM 20 MG PO TABS
40.0000 mg | ORAL_TABLET | Freq: Every day | ORAL | Status: DC
  Administered 2023-04-16 – 2023-04-18 (×3): 40 mg
  Filled 2023-04-15 (×3): qty 2

## 2023-04-15 MED ORDER — LIDOCAINE HCL (CARDIAC) PF 100 MG/5ML IV SOSY
PREFILLED_SYRINGE | INTRAVENOUS | Status: DC | PRN
  Administered 2023-04-15: 60 mg via INTRAVENOUS

## 2023-04-15 MED ORDER — DEXTROSE 5 % IV SOLN
1000.0000 mg | Freq: Once | INTRAVENOUS | Status: DC
  Filled 2023-04-15: qty 10

## 2023-04-15 MED ORDER — LIDOCAINE-EPINEPHRINE 1 %-1:100000 IJ SOLN
INTRAMUSCULAR | Status: DC | PRN
  Administered 2023-04-15: 7 mL

## 2023-04-15 MED ORDER — ONDANSETRON HCL 4 MG/2ML IJ SOLN
INTRAMUSCULAR | Status: AC
  Filled 2023-04-15: qty 2

## 2023-04-15 MED ORDER — PHENYLEPHRINE HCL-NACL 20-0.9 MG/250ML-% IV SOLN
INTRAVENOUS | Status: AC
  Filled 2023-04-15: qty 250

## 2023-04-15 MED ORDER — FENTANYL CITRATE (PF) 100 MCG/2ML IJ SOLN
INTRAMUSCULAR | Status: AC
  Filled 2023-04-15: qty 2

## 2023-04-15 MED ORDER — BACITRACIN ZINC 500 UNIT/GM EX OINT
TOPICAL_OINTMENT | CUTANEOUS | Status: AC
  Filled 2023-04-15: qty 28.35

## 2023-04-15 MED ORDER — THROMBIN 5000 UNITS EX KIT
PACK | CUTANEOUS | Status: AC
  Filled 2023-04-15: qty 2

## 2023-04-15 MED ORDER — LIDOCAINE HCL (PF) 2 % IJ SOLN
INTRAMUSCULAR | Status: AC
  Filled 2023-04-15: qty 5

## 2023-04-15 MED ORDER — GELATIN ABSORBABLE 12-7 MM EX MISC
CUTANEOUS | Status: AC
  Filled 2023-04-15: qty 1

## 2023-04-15 MED ORDER — CEFAZOLIN SODIUM-DEXTROSE 1-4 GM/50ML-% IV SOLN
INTRAVENOUS | Status: AC
  Filled 2023-04-15: qty 50

## 2023-04-15 MED ORDER — SEVOFLURANE IN SOLN
RESPIRATORY_TRACT | Status: AC
  Filled 2023-04-15: qty 250

## 2023-04-15 MED ORDER — 0.9 % SODIUM CHLORIDE (POUR BTL) OPTIME
TOPICAL | Status: DC | PRN
  Administered 2023-04-15: 250 mL

## 2023-04-15 MED ORDER — METOPROLOL TARTRATE 25 MG PO TABS
12.5000 mg | ORAL_TABLET | Freq: Two times a day (BID) | ORAL | Status: DC
  Administered 2023-04-15: 12.5 mg

## 2023-04-15 MED ORDER — ACETAMINOPHEN 160 MG/5ML PO SOLN: 650.0000 mg | Freq: Four times a day (QID) | ORAL | Status: DC | PRN

## 2023-04-15 MED ORDER — LOSARTAN POTASSIUM 50 MG PO TABS
100.0000 mg | ORAL_TABLET | Freq: Every day | ORAL | Status: DC
  Administered 2023-04-16 – 2023-04-18 (×3): 100 mg
  Filled 2023-04-15 (×3): qty 2

## 2023-04-15 MED ORDER — ENOXAPARIN SODIUM 40 MG/0.4ML IJ SOSY
40.0000 mg | PREFILLED_SYRINGE | INTRAMUSCULAR | Status: DC
  Administered 2023-04-16 – 2023-04-18 (×3): 40 mg via SUBCUTANEOUS
  Filled 2023-04-15 (×3): qty 0.4

## 2023-04-15 MED ORDER — DEXAMETHASONE SODIUM PHOSPHATE 10 MG/ML IJ SOLN
INTRAMUSCULAR | Status: DC | PRN
  Administered 2023-04-15: 5 mg via INTRAVENOUS

## 2023-04-15 MED ORDER — PROPOFOL 10 MG/ML IV BOLUS
INTRAVENOUS | Status: DC | PRN
  Administered 2023-04-15: 50 mg via INTRAVENOUS
  Administered 2023-04-15: 40 mg via INTRAVENOUS

## 2023-04-15 MED ORDER — BUPIVACAINE HCL (PF) 0.5 % IJ SOLN
INTRAMUSCULAR | Status: AC
  Filled 2023-04-15: qty 30

## 2023-04-15 MED ORDER — SUGAMMADEX SODIUM 200 MG/2ML IV SOLN
INTRAVENOUS | Status: AC
  Filled 2023-04-15: qty 2

## 2023-04-15 MED ORDER — CEFAZOLIN SODIUM-DEXTROSE 1-4 GM/50ML-% IV SOLN
1.0000 g | Freq: Once | INTRAVENOUS | Status: AC
  Administered 2023-04-15: 1 g via INTRAVENOUS

## 2023-04-15 MED ORDER — LABETALOL HCL 5 MG/ML IV SOLN
INTRAVENOUS | Status: DC | PRN
  Administered 2023-04-15: 5 mg via INTRAVENOUS

## 2023-04-15 SURGICAL SUPPLY — 45 items
BLADE CLIPPER SURG (BLADE) ×1 IMPLANT
BLADE SURG 15 STRL LF DISP TIS (BLADE) ×1 IMPLANT
BUR ACORN 7.5 PRECISION (BURR) ×1 IMPLANT
CATH VENT BACTISEAL SHUNT SYS (Shunt) IMPLANT
CHLORAPREP W/TINT 26 (MISCELLANEOUS) ×3 IMPLANT
DERMABOND ADVANCED .7 DNX12 (GAUZE/BANDAGES/DRESSINGS) ×1 IMPLANT
DRAPE INCISE IOBAN 66X45 STRL (DRAPES) ×1 IMPLANT
DRAPE LAPAROTOMY 100X77 ABD (DRAPES) IMPLANT
DRAPE POUCH INSTRU U-SHP 10X18 (DRAPES) IMPLANT
DRAPE SURG IRRIG POUCH 19X23 (DRAPES) IMPLANT
DRAPE WARM FLUID 44X44 (DRAPES) IMPLANT
DRSG TELFA 3X4 N-ADH STERILE (GAUZE/BANDAGES/DRESSINGS) ×2 IMPLANT
ELECT CAUTERY BLADE TIP 2.5 (TIP) ×1 IMPLANT
ELECT COATED BLADE 2.86 ST (ELECTRODE) ×1 IMPLANT
ELECT REM PT RETURN 9FT ADLT (ELECTROSURGICAL) ×1 IMPLANT
ELECTRODE CAUTERY BLDE TIP 2.5 (TIP) ×1 IMPLANT
ELECTRODE REM PT RTRN 9FT ADLT (ELECTROSURGICAL) ×1 IMPLANT
GAUZE 4X4 16PLY ~~LOC~~+RFID DBL (SPONGE) ×5 IMPLANT
GLOVE BIOGEL PI IND STRL 6.5 (GLOVE) ×1 IMPLANT
GLOVE SURG SYN 6.5 ES PF (GLOVE) ×3 IMPLANT
GLOVE SURG SYN 6.5 PF PI (GLOVE) ×3 IMPLANT
GLOVE SURG SYN 8.5 E (GLOVE) ×3 IMPLANT
GLOVE SURG SYN 8.5 PF PI (GLOVE) ×3 IMPLANT
GOWN SRG LRG LVL 4 IMPRV REINF (GOWNS) ×1 IMPLANT
GOWN SRG XL LVL 3 NONREINFORCE (GOWNS) IMPLANT
GOWN STRL REUS W/ TWL LRG LVL3 (GOWN DISPOSABLE) ×1 IMPLANT
KIT TURNOVER KIT A (KITS) ×1 IMPLANT
MANIFOLD NEPTUNE II (INSTRUMENTS) ×1 IMPLANT
MARKER SKIN DUAL TIP RULER LAB (MISCELLANEOUS) ×1 IMPLANT
NS IRRIG 1000ML POUR BTL (IV SOLUTION) ×2 IMPLANT
PACK CRANIOTOMY CUSTOM (CUSTOM PROCEDURE TRAY) ×1 IMPLANT
PACK LAMINECTOMY ARMC (PACKS) ×1 IMPLANT
PAD ARMBOARD 7.5X6 YLW CONV (MISCELLANEOUS) ×2 IMPLANT
PASSER CATH 65CM DISP (NEUROSURGERY SUPPLIES) IMPLANT
PASSER CATH SHUNT 55CM (INSTRUMENTS) IMPLANT
STAPLER SKIN PROX 35W (STAPLE) ×3 IMPLANT
SURGIFLO W/THROMBIN 8M KIT (HEMOSTASIS) IMPLANT
SUT MNCRL 4-0 27 PS-2 XMFL (SUTURE) ×2 IMPLANT
SUT SILK 2-0 18XBRD TIE 12 (SUTURE) ×1 IMPLANT
SUT VIC AB 2-0 CT1 18 (SUTURE) ×2 IMPLANT
SUTURE MNCRL 4-0 27XMF (SUTURE) ×2 IMPLANT
TAPE CLOTH 3X10 WHT NS LF (GAUZE/BANDAGES/DRESSINGS) ×2 IMPLANT
TOWEL OR 17X26 4PK STRL BLUE (TOWEL DISPOSABLE) ×4 IMPLANT
TRAP FLUID SMOKE EVACUATOR (MISCELLANEOUS) ×1 IMPLANT
VALVE PROGRAM HAKIM SYS (Valve) IMPLANT

## 2023-04-15 NOTE — Transfer of Care (Signed)
 Immediate Anesthesia Transfer of Care Note  Patient: Rita Taylor  Procedure(s) Performed: SHUNT INSERTION VENTRICULAR-PERITONEAL (Right)  Patient Location: PACU  Anesthesia Type:General  Level of Consciousness: awake, alert , and oriented  Airway & Oxygen Therapy: Patient Spontanous Breathing and Patient connected to face mask oxygen  Post-op Assessment: Report given to RN and Post -op Vital signs reviewed and stable  Post vital signs: stable  Last Vitals:  Vitals Value Taken Time  BP 150/71 04/15/23 1245  Temp    Pulse 46 04/15/23 1246  Resp 20 04/15/23 1246  SpO2 83 % 04/15/23 1246  Vitals shown include unfiled device data.  Last Pain:  Vitals:   04/14/23 2326  TempSrc: Oral  PainSc:          Complications: No notable events documented.

## 2023-04-15 NOTE — Anesthesia Preprocedure Evaluation (Addendum)
 Anesthesia Evaluation  Patient identified by MRN, date of birth, ID band Patient confused  General Assessment Comment:Patient with fluctuating mental status.   Reviewed: Allergy & Precautions, NPO status , Patient's Chart, lab work & pertinent test results  History of Anesthesia Complications Negative for: history of anesthetic complications  Airway Mallampati: III  TM Distance: >3 FB Neck ROM: full   Comment: Patient in neck brace Dental no notable dental hx. (+) Chipped   Pulmonary neg pulmonary ROS, neg sleep apnea, neg COPD, Patient abstained from smoking.Not current smoker   Pulmonary exam normal breath sounds clear to auscultation       Cardiovascular Exercise Tolerance: Good METShypertension, Pt. 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 Patient in cervical collar. Patient able to squeeze my hand with bilateral hands, able to wiggle bilateral toes  Neuromuscular disease  C-spine not cleared    GI/Hepatic negative GI ROS, Neg liver ROS,neg GERD  ,,  Endo/Other  diabetes    Renal/GU negative Renal ROS     Musculoskeletal  (+) Arthritis ,    Abdominal   Peds  Hematology  (+) Blood dyscrasia, anemia   Anesthesia Other Findings Past Medical History: No date: History of shingles No date: Hyperlipidemia No date: Hypertension 2008: Melanoma (HCC)     Comment:  removed by Orson Aloe, Wider excision by Katrinka Blazing left flank No date: Neurofibromatosis type II (HCC) 07/22/2018: Postoperative anemia No date: 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[facility])  Past Surgical History: 04/01/2023: ANTERIOR CERVICAL DECOMP/DISCECTOMY FUSION; N/A     Comment:  Procedure: ANTERIOR CERVICAL  DECOMPRESSION/DISCECTOMY               FUSION 1 LEVEL;  Surgeon: Venetia Night, MD;                Location: [facility] ORS;  Service: Neurosurgery;  Laterality:               N/A;  C5-6 ACDF 04/01/2023: APPLICATION OF INTRAOPERATIVE CT SCAN; N/A     Comment:  Procedure: APPLICATION OF INTRAOPERATIVE CT SCAN;                Surgeon: Venetia Night, MD;  Location: [facility] ORS;                Service: Neurosurgery;  Laterality: N/A; 1981: bitubal ligation 1990: BREAST ENHANCEMENT SURGERY 01/24/2020: BROW LIFT; Left     Comment:  Procedure: TARSORRHAPHY, LATERAL PLACEMENT LEFT LOWER               LID;  Surgeon: Imagene Riches, MD;  Location: [facility];  Service: Ophthalmology;  Laterality: Left; No date: COLON SURGERY No date: COLONOSCOPY 03/02/2019: ECTROPION REPAIR; Left     Comment:  Procedure: ECTROPION REPAIR, EXTENSIVE AND TARSORRHAPHY,              LATERAL PLACEMENT OF LEFT LOWER LID;  Surgeon: Imagene Riches, MD;  Location: [facility];  Service:               Ophthalmology;  Laterality: Left; No date: FACIAL COSMETIC SURGERY No date: GYNECOLOGIC CRYOSURGERY     Comment:  15  years ago, normal since then, was treated with               antibiotics, Annual pap smears for the past 20 years 04/01/2023: POSTERIOR CERVICAL FUSION/FORAMINOTOMY; N/A     Comment:  Procedure: C4-T4 posterior fusion, ORIF C6, C7, T3               fractures;  Surgeon: Venetia Night, MD;  Location:               [facility] ORS;  Service: Neurosurgery;  Laterality: N/A;  with              Brainlab, [place] 06/2018: TUMOR EXCISION; Left     Comment:  ear  BMI    Body Mass Index: 17.34 kg/m      Reproductive/Obstetrics negative OB ROS                             Anesthesia Physical Anesthesia Plan  ASA: 3  Anesthesia Plan: General ETT   Post-op Pain Management: Ofirmev IV (intra-op)*   Induction: Intravenous  PONV Risk Score and  Plan: 4 or greater and Ondansetron, Dexamethasone and Treatment may vary due to age or medical condition  Airway Management Planned: Oral ETT and Video Laryngoscope Planned  Additional Equipment:   Intra-op Plan:   Post-operative Plan: Possible Post-op intubation/ventilation  Informed Consent: I have reviewed the patients History and Physical, chart, labs and discussed the procedure including the risks, benefits and alternatives for the proposed anesthesia with the patient or authorized representative who has indicated his/her understanding and acceptance.     Dental Advisory Given and Consent reviewed with POA  Plan Discussed with: Anesthesiologist, CRNA and Surgeon  Anesthesia Plan Comments: (Patient consented for risks of anesthesia including but not limited to:  - adverse reactions to medications - damage to eyes, teeth, lips or other oral mucosa - nerve damage due to positioning  - sore throat or hoarseness - Damage to heart, brain, nerves, lungs, other parts of body or loss of life  Patient voiced understanding and assent. Consent given by son Seanne Chirico via phone due to patients current AMS. )       Anesthesia Quick Evaluation

## 2023-04-15 NOTE — Interval H&P Note (Signed)
 History and Physical Interval Note:  04/15/2023 10:03 AM  Rita Taylor  has presented today for surgery, with the diagnosis of hydrocephalus.  The various methods of treatment have been discussed with the patient and family. After consideration of risks, benefits and other options for treatment, the patient has consented to  Procedure(s): SHUNT INSERTION VENTRICULAR-PERITONEAL (Right) as a surgical intervention.  The patient's history has been reviewed, patient examined, no change in status, stable for surgery.  I have reviewed the patient's chart and labs.  Questions were answered to the patient's satisfaction.    Heart sounds normal no MRG. Chest Clear to Auscultation Bilaterally.   Necola Bluestein

## 2023-04-15 NOTE — Progress Notes (Signed)
 Palliative:  Chart reviewed this AM. When attempted to see patient in OR. Attempted to see postop but unable, PMT will follow up as able.  Gerlean Ren, DNP, AGNP-C Palliative Medicine Team Team Phone # [phone]  Pager # [phone]   NO CHARGE

## 2023-04-15 NOTE — Progress Notes (Signed)
 [facility] Progress Note  Patient: Rita Taylor    MVH:[id number]  DOA: 04/13/2023     Date of Service: the patient was seen and examined on 04/15/2023  No chief complaint on file.  Brief hospital course: Rita Taylor is a 83 y.o. female with medical history significant of HTN, HLD, diabetes, depression, melanoma, left foot drop, neurofibromatosis type II, recent neck fracture (S/PE of surgery), who presents with brain tumor.   Patient was recently hospitalized due to neck fracture. Patient is s/p of ACDF C5-6 followed by posterior lateral arthrodesis C4-T4, ORIF C6, C7 and T3 fractures on 02/14, on c-collar, who presents with brain tumor.  Patient is currently doing inpatient rehab. Her anterior/posterior neck incisions as well as upper thoracic incisions are healing well.  Patient has severe protein calorie malnutrition. She has very poor p.o. intake and was refusing multiple supplements.  Her family was agreeable to start tube feeds and cortak placed on 02/26.   Per discharge summary from inpatient rehab,  pt has fluctuating mental status. MRI of brain is done per Dr. Osborne Oman recommendation, which showed hydrocephalus with progression of residual tumor,  mass effect on brain stem, with surrounding vasogenic edema, due to possible vestibular schwannoma.  The case was discussed with Dr. Marcell Barlow who recommended transfer to [facility] for surgical intervention.    When I saw pt on the floor, pt is alert and orientated x 3.  She denies headache.  Moves all extremities.  No facial droop or slurred speech.  She has neck pain, wearing c-collar.  Denies chest pain, cough, SOB.  No nausea, vomiting, diarrhea or abdominal pain.  No symptoms of UTI.   MRI-brain on 04/12/23 1. Postoperative changes from prior left retrosigmoid craniectomy for tumor resection. 4.1 x 2.3 x 2.6 cm residual and/or recurrent mass positioned at the left CP angle cistern extending into the left internal auditory  canal, consistent with a vestibular schwannoma. Associated mass effect on the adjacent brainstem with surrounding vasogenic edema. 2. Lateral and third ventriculomegaly, increased as compared to previous MRIs, consistent with hydrocephalus. Rind of FLAIR hyperintensity surrounding the dilated lateral ventricles likely reflects a degree of transependymal flow of CSF. This is likely due to compression of the cerebral aqueduct by the left CP angle mass. This is suspected to be the symptomatic finding given the provided history. 3. No other acute intracranial abnormality.       Data reviewed independently and ED Course: pt was found to have WBC 12.1, GFR> 60. Temperature normal, blood pressure 157/65, heart rate 94, RR 22, oxygen saturation 94% on room air.  Patient is admitted to PCU as inpatient.  Message sent to Dr. Katrinka Blazing of neurosurgery for consult.   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  No focal neurologic deficit now.  -10 mg of Decadron, followed by 4 mg 3 times daily -Frequent neurocheck -Fall precaution -INR/PTT/type screen Neurosurgery consulted, keep n.p.o. after midnight.  Discontinued aspirin.  Patient is scheduled for VP shunt placement tomorrow a.m.   Hx of fracture of neck: S/p of surgery.  -Wearing c-collar -As needed oxycodone, Robaxin -Neurontin   Primary hypertension -As needed hydralazine -Cozaar, metoprolol   Hyperlipidemia -Pravastatin   Leukocytosis: WBC 14.1 on 2/25 --> 12.1 today, treating down, no fever, no signs of infection, likely reactive. 2/28 WBC 13.7,  procalcitonin negative, most likely reactive leukocytosis -Follow-up by CBC   Depression -Wellbutrin   Protein-calorie malnutrition, severe: Body weight 47.8 kg, BMI 19.27 -Tube  feeding -Nutrition consult    Body mass index is 17.34 kg/m.  Nutrition Problem: Severe Malnutrition Etiology: chronic illness Interventions: Interventions: Tube feeding   Diet: NG tube feeding DVT Prophylaxis: SCDs  Advance goals of care discussion: Full code  Family Communication: family was not present at bedside, at the time of interview.  The pt provided permission to discuss medical plan with the family. Opportunity was given to ask question and all questions were answered satisfactorily.   Disposition:  Pt is from SNF, admitted with AMS, vestibular schwannoma, scheduled for VP shunt insertion on 2/28, which precludes a safe discharge. Discharge to SNF, when cleared by neurosurgery.  Subjective: No significant events overnight, patient was seen after VP shunt placement. Patient tolerated procedure well.  Denied any headache or dizziness, no any pain.    Physical Exam: General: NAD, lying comfortably Appear in no distress, affect appropriate Eyes: PERRLA ENT: Oral Mucosa Clear, moist, neck collar was off today  Neck: no JVD,  Cardiovascular: S1 and S2 Present, no Murmur,  Respiratory: good respiratory effort, Bilateral Air entry equal and Decreased, no Crackles, no wheezes Abdomen: Bowel Sound present, Soft and no tenderness,  Skin: no rashes Extremities: no Pedal edema, no calf tenderness Neurologic: without any new focal findings Gait not checked due to patient safety concerns  Vitals:   04/15/23 1300 04/15/23 1315 04/15/23 1320 04/15/23 1343  BP: (!) 159/80 (!) 143/70  (!) 141/71  Pulse: 69 68 72 79  Resp: 10 14 18 16   Temp: 97.7 F (36.5 C)   (!) 97.3 F (36.3 C)  TempSrc:    Oral  SpO2: 100% 100% 100% 97%  Weight:      Height:        Intake/Output Summary (Last 24 hours) at 04/15/2023 1557 Last data filed at 04/15/2023 1222 Gross per 24 hour  Intake 914.5 ml  Output 511 ml  Net 403.5 ml   Filed Weights   04/14/23 0431 04/15/23 0515 04/15/23  1016  Weight: 44.7 kg 43.3 kg 43 kg    Data Reviewed: I have personally reviewed and interpreted daily labs, tele strips, imagings as discussed above. I reviewed all nursing notes, pharmacy notes, vitals, pertinent old records I have discussed plan of care as described above with RN and patient/family.  CBC: Recent Labs  Lab 04/09/23 0949 04/11/23 0637 04/12/23 0529 04/13/23 1922 04/14/23 0504 04/15/23 0457  WBC 12.9* 14.4* 14.1* 12.1* 11.7* 13.7*  NEUTROABS 9.4*  --  10.4* 9.1*  --   --   HGB 10.8* 11.3* 11.1* 10.6* 11.0* 10.9*  HCT 33.2* 35.7* 34.2* 32.9* 33.5* 32.6*  MCV 90.7 91.3 90.7 92.9 89.8 89.3  PLT 471* 551* 533* 548* 586* 647*   Basic Metabolic Panel: Recent Labs  Lab 04/09/23 0949 04/11/23 0637 04/13/23 1922 04/15/23 0457  NA 137 140 138 133*  K 3.6 4.4 3.9 4.3  CL 100 101 102 101  CO2 27 30 25 24   GLUCOSE 145* 141* 104* 152*  BUN 16 18 17 16   CREATININE 0.52 0.58 0.52 0.48  CALCIUM 8.7* 9.0 8.4* 8.8*  MG  --   --   --  2.3  PHOS  --   --   --  2.8    Studies: No results found.  Scheduled Meds:  buPROPion  150 mg Oral Daily   calcium-vitamin D  1 tablet Oral Daily   Chlorhexidine Gluconate Cloth  6 each Topical Q0600   cyanocobalamin  1,000 mcg Oral Daily   dexamethasone (DECADRON) injection  4 mg Intravenous TID   free water  200 mL Per Tube Q6H   gabapentin  300 mg Oral TID PC & HS   losartan  100 mg Oral Daily   metoprolol tartrate  12.5 mg Oral BID   multivitamin with minerals  1 tablet Per Tube Daily   pravastatin  40 mg Oral Daily   thiamine  100 mg Per Tube Daily   Continuous Infusions:  feeding supplement (OSMOLITE 1.2 CAL) Stopped (04/15/23 0000)   PRN Meds: acetaminophen (TYLENOL) oral liquid 160 mg/5 mL, methocarbamol, ondansetron (ZOFRAN) IV, oxyCODONE, polyethylene glycol  Time spent: 40 minutes  Author: Gillis Santa. MD [facility] 04/15/2023 3:57 PM  To reach On-call, see care teams to locate the attending and  reach out to them via [email or web address]. If 7PM-7AM, please contact night-coverage If you still have difficulty reaching the attending provider, please page the [facility] (Director on Call) for [facility] on amion for assistance.
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
## 2023-04-15 NOTE — Op Note (Signed)
 Indications: The patient is a 83yo female who presented with hydrocephalus. Due to ongoing symptoms, shunt placement was recommended.  Findings: placement of shunt  Preoperative Diagnosis: Obstructive Hydrocephalus Postoperative Diagnosis: same   EBL: 10 ml IVF: see anesthesia record Drains: none Disposition: Extubated and Stable to PACU Complications: none   Preoperative Note:   Risks of surgery discussed pre-operatively.   NAME OF PROCEDURE:               1. Right frontal approach for ventriculoperitoneal shunt placement with Codman Hakim Valve set at 140  PROCEDURE:  Patient was brought to the operating room, intubated. The patient was positioned with the right frontal area chosen for ventricular access.  A semilunar incision was planned over Kocher's point.    A time-out was performed.  Preoperative antibiotics were given.  The patient was prepped and draped.  Please note that Dr. Everlene Farrier acted as co-surgeon for placement of the shunt into the peritoneum given his expertise as a Development worker, international aid.   The cranial incision was opened, then a burr hole made.  A pocket was developed posterolaterally from the cranial incision.  The dura was coagulated and opened.  The cortical entry site was coagulated.  The passer was used to pass to the incision behind the ear.  A linear incision was opened. A second passer was then used to tunnel beneath the skin to the abdominal incision performed by Dr. Everlene Farrier.  The shunt had been pre-assembled, and was then passed from the cranial incision to the abdominal incision.    The bactiseal ventricular catheter was then introduced into the lateral ventricle.  Brisk spinal fluid flow was noted.  The catheter was attached to the valve, secured, and distal flow confirmed. Dr. Everlene Farrier then placed the distal catheter into the peritoneum.  We then closed all 3 incisions with vicryl and staples on the cranial portion.  Needle, lap and all counts were correct at  the end of the case.    Manning Charity PA assisted in the procedure. An assistant was required for this procedure due to the complexity.  The assistant provided assistance in tissue manipulation and suction, and was required for the successful and safe performance of the procedure. I performed the critical portions of the procedure.   Venetia Night MD Neurosurgery

## 2023-04-15 NOTE — Progress Notes (Signed)
   Inpatient Rehabilitation Admissions Coordinator   I spoke with patient's son by phone to clarify intent for further CIR rehab postoperatively. He states he is discussing with his sister today and will let me know which rehab venue they prefer. Noted plan for OR today. Please order PT and OT postop.  Ottie Glazier, RN, MSN Rehab Admissions Coordinator [phone] 04/15/2023 8:14 AM

## 2023-04-15 NOTE — Plan of Care (Signed)

## 2023-04-15 NOTE — Op Note (Signed)
 PROCEDURES: Placement of Ventriculoperitoneal Shunt  Pre-operative Diagnosis:Hydrocephalus  Post-operative Diagnosis: Same  Anesthesia: General endotracheal anesthesia  Surgeons: Volanda Napoleon MD  and Sterling Big, MD FACS  Anesthesia: Gen. with endotracheal tube  Findings: VP shunt within peritoneal catheter w/o kinks and widely patent No evidence of intra-abdominal injuries  Estimated Blood Loss: 5cc         Complications: none             Condition: stable  Procedure Details  The patient was seen again in the Holding Room. The benefits, complications, treatment options, and expected outcomes were discussed with the family. The risks of bleeding, infection, recurrence of symptoms, failure to resolve symptoms,  bowel injury, any of which could require further surgery were reviewed with the patient.   The patient was taken to Operating Room, identified as Rita Taylor and the procedure verified.  A Time Out was held and the above information confirmed.  Prior to the induction of general anesthesia, antibiotic prophylaxis was administered. VTE prophylaxis was in place. General endotracheal anesthesia was then administered and tolerated well.  After the induction, the head, neck chest and abdomen were prepped with Chloraprep and draped in the sterile fashion. The patient was positioned in the supine position. Please See Dr. Osborne Oman report for his portion. I was in charge of the abdominal portion of the operation. Two surgeons required given the nature of this case.  Midline mini laparotomy incision created subxiphoid area. The sub q was divided with cautery and fascia was elevated between two kochers and incised. The peritoneum was elevated and incised with Jen Mow. THe abdominal cavity was entered under direct visualization. No injuries were observed. I was able to insert the Peritoneal portion of the catheter into the abdominal cavity. Catheter was patent w/o any kinks.   Liposomal marcaine injected as full thickness abdominal block. The fascia was closed with multiple interrupted 0 vicryls and the skin closed with 4-0 monocryl. Dermabond was used to coat all the skin incision. Needle and laparotomy count were correct and there were no immediate occasions  Sterling Big, MD, FACS

## 2023-04-15 NOTE — Progress Notes (Signed)
 Inpatient Rehabilitation Admissions Coordinator   I spoke with son, [name] Selinda Orion) by phone. He and his sister would like to pursue readmit of their Mom to CIR at [facility] in [place] when medically ready. Dr Shearon Stalls is in agreement. Please reorder PT and OT to begin mobilization and we will follow up on Monday for possible readmit to [facility] if she is able to participate .  Ottie Glazier, RN, MSN Rehab Admissions Coordinator [phone] 04/15/2023 4:40 PM

## 2023-04-15 NOTE — Anesthesia Postprocedure Evaluation (Signed)
 Anesthesia Post Note  Patient: Rita Taylor  Procedure(s) Performed: SHUNT INSERTION VENTRICULAR-PERITONEAL (Right)  Patient location during evaluation: PACU Anesthesia Type: General Level of consciousness: confused Pain management: pain level controlled Vital Signs Assessment: post-procedure vital signs reviewed and stable Respiratory status: spontaneous breathing, nonlabored ventilation, respiratory function stable and patient connected to nasal cannula oxygen Cardiovascular status: blood pressure returned to baseline and stable Postop Assessment: no apparent nausea or vomiting Anesthetic complications: no   No notable events documented.   Last Vitals:  Vitals:   04/15/23 1300 04/15/23 1315  BP: (!) 159/80 (!) 143/70  Pulse: 69 68  Resp: 10 14  Temp: 36.5 C   SpO2: 100% 100%    Last Pain:  Vitals:   04/15/23 1315  TempSrc:   PainSc: 0-No pain                 Louie Boston

## 2023-04-15 NOTE — Progress Notes (Signed)
 Incision at head x2 covered with dry, clean, intact dressing. Dressing per OR nurse is bacitracin, telfa. Abdominal incision x1 is dermabond dry clean and intact. Mandy, OR nurse to put LDA from OR in flowsheets  Per. Manning Charity, PA-patient is to head elevated above 30 degrees, and no other restrictions at this time. Patient to return to room that she was in prior to procedure.

## 2023-04-15 NOTE — Discharge Instructions (Signed)
 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 Increase protein intake to promote wound healing.  Recommended activity: activity as tolerated. You should walk multiple times per day  Special Instructions  No straining, no heavy lifting > 10lbs x 4 weeks.  Keep incision area clean and dry. May shower in 2 days. No baths or pools for 6 weeks.  Please remove dressing tomorrow, no need to apply a bandage afterwards  You have sutures or staples that will be removed in clinic.   Please take pain medications as directed. Take a stool softener if on pain medications   Please Report any of the following: Nausea or Vomiting, Temperature is greater than 101.53F (38.1C) degrees, Dizziness, Abdominal Pain, Difficulty Breathing or Shortness of Breath, Inability to Eat, drink Fluids, or Take medications, Bleeding, swelling, or drainage from surgical incision sites, New numbness or weakness, and Bowel or bladder dysfunction to the neurosurgeon on call. How to contact us:  If you have any questions/concerns before or after surgery, you can reach [place] at [phone], or you can send a mychart message. We can be reached by phone or mychart 8am-4pm, Monday-Friday.  *Please note: Calls after 4pm are forwarded to a third party answering service. Mychart messages are not routinely monitored during evenings, weekends, and holidays. Please call our office to contact the answering service for urgent concerns during non-business hours.   Additional Follow up appointments Please follow up with Manning Charity PA-C as scheduled in 2-3 weeks   Please see below for scheduled appointments:  Future Appointments  Date Time Provider Department Center  04/28/2023  1:30 PM Susanne Borders, PA [facility] None  05/26/2023  2:45 PM Venetia Night, MD [facility] None   06/06/2023  1:40 PM [facility] ANNUAL WELLNESS VISIT [facility]  06/07/2023  1:30 PM [facility] None  06/07/2023  1:45 PM [name], MD [facility] None  07/05/2023  2:30 PM Susanne Borders, PA [facility] None

## 2023-04-15 NOTE — Anesthesia Procedure Notes (Signed)
 Procedure Name: Intubation Date/Time: 04/15/2023 11:04 AM  Performed by: Rodney Booze, CRNAPre-anesthesia Checklist: Patient identified, Emergency Drugs available, Suction available and Patient being monitored Patient Re-evaluated:Patient Re-evaluated prior to induction Oxygen Delivery Method: Circle system utilized Preoxygenation: Pre-oxygenation with 100% oxygen Induction Type: IV induction Ventilation: Mask ventilation without difficulty Laryngoscope Size: McGrath and 3 Grade View: Grade I Tube type: Oral Tube size: 6.5 mm Number of attempts: 1 Airway Equipment and Method: Stylet and Oral airway Placement Confirmation: ETT inserted through vocal cords under direct vision, positive ETCO2 and breath sounds checked- equal and bilateral Secured at: 22 cm Tube secured with: Tape Dental Injury: Teeth and Oropharynx as per pre-operative assessment

## 2023-04-16 DIAGNOSIS — E43 Unspecified severe protein-calorie malnutrition: Secondary | ICD-10-CM

## 2023-04-16 DIAGNOSIS — Z515 Encounter for palliative care: Secondary | ICD-10-CM

## 2023-04-16 DIAGNOSIS — G911 Obstructive hydrocephalus: Principal | ICD-10-CM

## 2023-04-16 DIAGNOSIS — D333 Benign neoplasm of cranial nerves: Secondary | ICD-10-CM | POA: Diagnosis not present

## 2023-04-16 LAB — BASIC METABOLIC PANEL
Anion gap: 9 (ref 5–15)
BUN: 20 mg/dL (ref 8–23)
CO2: 24 mmol/L (ref 22–32)
Calcium: 8.4 mg/dL — ABNORMAL LOW (ref 8.9–10.3)
Chloride: 101 mmol/L (ref 98–111)
Creatinine, Ser: 0.51 mg/dL (ref 0.44–1.00)
GFR, Estimated: >60 mL/min (ref >60)
Glucose, Bld: 188 mg/dL — ABNORMAL HIGH (ref 70–99)
Potassium: 4.3 mmol/L (ref 3.5–5.1)
Sodium: 134 mmol/L — ABNORMAL LOW (ref 135–145)

## 2023-04-16 LAB — GLUCOSE, CAPILLARY
Glucose-Capillary: 128 mg/dL — ABNORMAL HIGH (ref 70–99)
Glucose-Capillary: 131 mg/dL — ABNORMAL HIGH (ref 70–99)
Glucose-Capillary: 147 mg/dL — ABNORMAL HIGH (ref 70–99)
Glucose-Capillary: 196 mg/dL — ABNORMAL HIGH (ref 70–99)
Glucose-Capillary: 209 mg/dL — ABNORMAL HIGH (ref 70–99)

## 2023-04-16 LAB — CBC
HCT: 31 % — ABNORMAL LOW (ref 36.0–46.0)
Hemoglobin: 10.3 g/dL — ABNORMAL LOW (ref 12.0–15.0)
MCH: 30.3 pg (ref 26.0–34.0)
MCHC: 33.2 g/dL (ref 30.0–36.0)
MCV: 91.2 fL (ref 80.0–100.0)
Platelets: 667 10*3/uL — ABNORMAL HIGH (ref 150–400)
RBC: 3.4 MIL/uL — ABNORMAL LOW (ref 3.87–5.11)
RDW: 14.3 % (ref 11.5–15.5)
WBC: 15.1 10*3/uL — ABNORMAL HIGH (ref 4.0–10.5)
nRBC: 0 % (ref 0.0–0.2)

## 2023-04-16 LAB — PHOSPHORUS: Phosphorus: 2.6 mg/dL (ref 2.5–4.6)

## 2023-04-16 LAB — MAGNESIUM: Magnesium: 2.5 mg/dL — ABNORMAL HIGH (ref 1.7–2.4)

## 2023-04-16 MED ORDER — METOPROLOL TARTRATE 25 MG PO TABS
25.0000 mg | ORAL_TABLET | Freq: Two times a day (BID) | ORAL | Status: DC
  Administered 2023-04-16 – 2023-04-18 (×5): 25 mg
  Filled 2023-04-16 (×5): qty 1
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
## 2023-04-16 NOTE — Plan of Care (Signed)
   Problem: Education: Goal: Knowledge of General Education information will improve Description: Including pain rating scale, medication(s)/side effects and non-pharmacologic comfort measures Outcome: Progressing   Problem: Clinical Measurements: Goal: Ability to maintain clinical measurements within normal limits will improve Outcome: Progressing
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
## 2023-04-16 NOTE — Progress Notes (Addendum)
   Neurosurgery Progress Note  History: TANYIA GRABBE is here for obstructive hydrocephalus  POD1: Doing well, brigher  Physical Exam: Vitals:   04/16/23 0400 04/16/23 0907  BP: (!) 160/74 (!) 162/75  Pulse: 99 (!) 109  Resp: 18 20  Temp: (!) 97.1 F (36.2 C) (!) 97.4 F (36.3 C)  SpO2: 97% 99%    AA Ox3 CNI except L facial droop (baseline).  Speech is at baseline MAEW Dressings dry   Assessment/Plan:  [name] is improved after shunt procedure  - mobilize - pain control - DVT prophylaxis ok to start - PTOT - brace on when OOB - d/c dexamethasone - restart ASA on 3/3  Venetia Night MD, [facility] Department of Neurosurgery

## 2023-04-16 NOTE — Care Management Important Message (Signed)
 Important Message  Patient Details  Name: Rita Taylor MRN: [id number] Date of Birth: Nov 01, 1940   Important Message Given:  Yes - Medicare IM     Cristela Blue, CMA 04/16/2023, 9:38 AM

## 2023-04-17 DIAGNOSIS — D333 Benign neoplasm of cranial nerves: Secondary | ICD-10-CM | POA: Diagnosis not present

## 2023-04-17 LAB — CBC
HCT: 30.9 % — ABNORMAL LOW (ref 36.0–46.0)
Hemoglobin: 9.8 g/dL — ABNORMAL LOW (ref 12.0–15.0)
MCH: 29.4 pg (ref 26.0–34.0)
MCHC: 31.7 g/dL (ref 30.0–36.0)
MCV: 92.8 fL (ref 80.0–100.0)
Platelets: 554 10*3/uL — ABNORMAL HIGH (ref 150–400)
RBC: 3.33 MIL/uL — ABNORMAL LOW (ref 3.87–5.11)
RDW: 14.5 % (ref 11.5–15.5)
WBC: 12.8 10*3/uL — ABNORMAL HIGH (ref 4.0–10.5)
nRBC: 0 % (ref 0.0–0.2)

## 2023-04-17 LAB — BASIC METABOLIC PANEL
Anion gap: 9 (ref 5–15)
BUN: 22 mg/dL (ref 8–23)
CO2: 26 mmol/L (ref 22–32)
Calcium: 8.4 mg/dL — ABNORMAL LOW (ref 8.9–10.3)
Chloride: 100 mmol/L (ref 98–111)
Creatinine, Ser: 0.46 mg/dL (ref 0.44–1.00)
GFR, Estimated: >60 mL/min (ref >60)
Glucose, Bld: 133 mg/dL — ABNORMAL HIGH (ref 70–99)
Potassium: 4.8 mmol/L (ref 3.5–5.1)
Sodium: 135 mmol/L (ref 135–145)

## 2023-04-17 LAB — GLUCOSE, CAPILLARY
Glucose-Capillary: 137 mg/dL — ABNORMAL HIGH (ref 70–99)
Glucose-Capillary: 139 mg/dL — ABNORMAL HIGH (ref 70–99)
Glucose-Capillary: 140 mg/dL — ABNORMAL HIGH (ref 70–99)
Glucose-Capillary: 149 mg/dL — ABNORMAL HIGH (ref 70–99)
Glucose-Capillary: 155 mg/dL — ABNORMAL HIGH (ref 70–99)
Glucose-Capillary: 157 mg/dL — ABNORMAL HIGH (ref 70–99)

## 2023-04-17 LAB — PHOSPHORUS: Phosphorus: 2.7 mg/dL (ref 2.5–4.6)

## 2023-04-17 LAB — MAGNESIUM: Magnesium: 2.3 mg/dL (ref 1.7–2.4)

## 2023-04-17 MED ORDER — ASPIRIN 81 MG PO TBEC
81.0000 mg | DELAYED_RELEASE_TABLET | Freq: Every day | ORAL | Status: DC
  Administered 2023-04-18: 81 mg via ORAL
  Filled 2023-04-17: qty 1

## 2023-04-17 MED ORDER — MEGESTROL ACETATE 400 MG/10ML PO SUSP
400.0000 mg | Freq: Every day | ORAL | Status: DC
  Administered 2023-04-17 – 2023-04-18 (×2): 400 mg
  Filled 2023-04-17 (×2): qty 10

## 2023-04-17 MED ORDER — ARTIFICIAL TEARS OPHTHALMIC OINT
TOPICAL_OINTMENT | OPHTHALMIC | Status: DC | PRN
Start: 2023-04-17 — End: 2023-04-18
  Filled 2023-04-17: qty 3.5

## 2023-04-17 NOTE — Plan of Care (Signed)
   Problem: Education: Goal: Knowledge of General Education information will improve Description Including pain rating scale, medication(s)/side effects and non-pharmacologic comfort measures Outcome: Progressing   Problem: Health Behavior/Discharge Planning: Goal: Ability to manage health-related needs will improve Outcome: Progressing

## 2023-04-17 NOTE — Plan of Care (Signed)
  Problem: Education: Goal: Knowledge of General Education information will improve Description: Including pain rating scale, medication(s)/side effects and non-pharmacologic comfort measures 04/17/2023 1316 by Joanne Chars, RN Outcome: Progressing 04/17/2023 1314 by Joanne Chars, RN Outcome: Progressing   Problem: Health Behavior/Discharge Planning: Goal: Ability to manage health-related needs will improve 04/17/2023 1316 by Joanne Chars, RN Outcome: Progressing 04/17/2023 1314 by Joanne Chars, RN Outcome: Progressing   Problem: Clinical Measurements: Goal: Ability to maintain clinical measurements within normal limits will improve 04/17/2023 1316 by Joanne Chars, RN Outcome: Progressing 04/17/2023 1314 by Joanne Chars, RN Outcome: Progressing Goal: Will remain free from infection 04/17/2023 1316 by Joanne Chars, RN Outcome: Progressing 04/17/2023 1314 by Joanne Chars, RN Outcome: Progressing Goal: Diagnostic test results will improve 04/17/2023 1316 by Joanne Chars, RN Outcome: Progressing 04/17/2023 1314 by Joanne Chars, RN Outcome: Progressing Goal: Respiratory complications will improve 04/17/2023 1316 by Joanne Chars, RN Outcome: Progressing 04/17/2023 1314 by Joanne Chars, RN Outcome: Progressing Goal: Cardiovascular complication will be avoided 04/17/2023 1316 by Joanne Chars, RN Outcome: Progressing 04/17/2023 1314 by Joanne Chars, RN Outcome: Progressing   Problem: Activity: Goal: Risk for activity intolerance will decrease 04/17/2023 1316 by Joanne Chars, RN Outcome: Progressing 04/17/2023 1314 by Joanne Chars, RN Outcome: Progressing   Problem: Nutrition: Goal: Adequate nutrition will be maintained 04/17/2023 1316 by Joanne Chars, RN Outcome: Progressing 04/17/2023 1314 by Joanne Chars, RN Outcome: Progressing   Problem: Coping: Goal: Level of anxiety will decrease 04/17/2023 1316 by Joanne Chars, RN Outcome: Progressing 04/17/2023 1314 by Joanne Chars,  RN Outcome: Progressing   Problem: Elimination: Goal: Will not experience complications related to bowel motility 04/17/2023 1316 by Joanne Chars, RN Outcome: Progressing 04/17/2023 1314 by Joanne Chars, RN Outcome: Progressing Goal: Will not experience complications related to urinary retention 04/17/2023 1316 by Joanne Chars, RN Outcome: Progressing 04/17/2023 1314 by Joanne Chars, RN Outcome: Progressing   Problem: Safety: Goal: Ability to remain free from injury will improve 04/17/2023 1316 by Joanne Chars, RN Outcome: Progressing 04/17/2023 1314 by Joanne Chars, RN Outcome: Progressing   Problem: Skin Integrity: Goal: Risk for impaired skin integrity will decrease 04/17/2023 1316 by Joanne Chars, RN Outcome: Progressing 04/17/2023 1314 by Joanne Chars, RN Outcome: Progressing
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
## 2023-04-17 NOTE — Plan of Care (Signed)
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
## 2023-04-17 NOTE — Progress Notes (Signed)
 Pt fed by this nurse, consumed 100% of cup of berries and magic cup. Also bites of her [place]. Drank 160 ml of tea.

## 2023-04-17 NOTE — Progress Notes (Signed)
   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  Speech is at baseline MAEW Dressings dry - bolstered on frontal portion   Assessment/Plan:  Uvaldo Rising Shryock is improved after shunt procedure  - mobilize - pain control - DVT prophylaxis ok to start - PTOT - brace on when OOB - restart ASA on 3/3 - Will remove dressings 3/3  Venetia Night MD, [facility] Department of Neurosurgery

## 2023-04-18 ENCOUNTER — Encounter: Payer: Self-pay | Admitting: Neurosurgery

## 2023-04-18 ENCOUNTER — Inpatient Hospital Stay (HOSPITAL_COMMUNITY): Admission: AD | Admit: 2023-04-18 | Source: Intra-hospital | Admitting: Physical Medicine and Rehabilitation

## 2023-04-18 ENCOUNTER — Inpatient Hospital Stay (HOSPITAL_COMMUNITY)
Admission: AD | Admit: 2023-04-18 | Discharge: 2023-04-27 | DRG: 056 | Disposition: A | Discharge status: Other Acute Inpatient Hospital | Source: Other Acute Inpatient Hospital | Attending: Physical Medicine and Rehabilitation | Admitting: Physical Medicine and Rehabilitation

## 2023-04-18 ENCOUNTER — Other Ambulatory Visit: Payer: Self-pay

## 2023-04-18 DIAGNOSIS — I6981 Attention and concentration deficit following other cerebrovascular disease: Secondary | ICD-10-CM

## 2023-04-18 DIAGNOSIS — R414 Neurologic neglect syndrome: Secondary | ICD-10-CM | POA: Diagnosis present

## 2023-04-18 DIAGNOSIS — Z1152 Encounter for screening for COVID-19: Secondary | ICD-10-CM

## 2023-04-18 DIAGNOSIS — R519 Headache, unspecified: Secondary | ICD-10-CM | POA: Diagnosis not present

## 2023-04-18 DIAGNOSIS — R1313 Dysphagia, pharyngeal phase: Secondary | ICD-10-CM | POA: Diagnosis present

## 2023-04-18 DIAGNOSIS — S12600D Unspecified displaced fracture of seventh cervical vertebra, subsequent encounter for fracture with routine healing: Secondary | ICD-10-CM

## 2023-04-18 DIAGNOSIS — J984 Other disorders of lung: Secondary | ICD-10-CM | POA: Diagnosis not present

## 2023-04-18 DIAGNOSIS — R918 Other nonspecific abnormal finding of lung field: Secondary | ICD-10-CM | POA: Diagnosis not present

## 2023-04-18 DIAGNOSIS — R64 Cachexia: Secondary | ICD-10-CM | POA: Diagnosis present

## 2023-04-18 DIAGNOSIS — G9341 Metabolic encephalopathy: Secondary | ICD-10-CM | POA: Diagnosis not present

## 2023-04-18 DIAGNOSIS — E1165 Type 2 diabetes mellitus with hyperglycemia: Secondary | ICD-10-CM | POA: Diagnosis present

## 2023-04-18 DIAGNOSIS — G936 Cerebral edema: Secondary | ICD-10-CM | POA: Diagnosis present

## 2023-04-18 DIAGNOSIS — R627 Adult failure to thrive: Secondary | ICD-10-CM | POA: Diagnosis present

## 2023-04-18 DIAGNOSIS — D72829 Elevated white blood cell count, unspecified: Secondary | ICD-10-CM | POA: Diagnosis present

## 2023-04-18 DIAGNOSIS — Y92410 Unspecified street and highway as the place of occurrence of the external cause: Secondary | ICD-10-CM | POA: Diagnosis not present

## 2023-04-18 DIAGNOSIS — R29810 Facial weakness: Secondary | ICD-10-CM | POA: Diagnosis present

## 2023-04-18 DIAGNOSIS — I6931 Attention and concentration deficit following cerebral infarction: Secondary | ICD-10-CM

## 2023-04-18 DIAGNOSIS — Z681 Body mass index (BMI) 19 or less, adult: Secondary | ICD-10-CM | POA: Diagnosis not present

## 2023-04-18 DIAGNOSIS — F32A Depression, unspecified: Secondary | ICD-10-CM | POA: Diagnosis present

## 2023-04-18 DIAGNOSIS — Z79899 Other long term (current) drug therapy: Secondary | ICD-10-CM

## 2023-04-18 DIAGNOSIS — E785 Hyperlipidemia, unspecified: Secondary | ICD-10-CM | POA: Diagnosis present

## 2023-04-18 DIAGNOSIS — R159 Full incontinence of feces: Secondary | ICD-10-CM | POA: Diagnosis not present

## 2023-04-18 DIAGNOSIS — H9190 Unspecified hearing loss, unspecified ear: Secondary | ICD-10-CM | POA: Diagnosis present

## 2023-04-18 DIAGNOSIS — G911 Obstructive hydrocephalus: Principal | ICD-10-CM | POA: Diagnosis present

## 2023-04-18 DIAGNOSIS — R1311 Dysphagia, oral phase: Secondary | ICD-10-CM | POA: Diagnosis present

## 2023-04-18 DIAGNOSIS — Z515 Encounter for palliative care: Secondary | ICD-10-CM | POA: Diagnosis not present

## 2023-04-18 DIAGNOSIS — R5381 Other malaise: Secondary | ICD-10-CM | POA: Diagnosis present

## 2023-04-18 DIAGNOSIS — I69891 Dysphagia following other cerebrovascular disease: Secondary | ICD-10-CM

## 2023-04-18 DIAGNOSIS — Z982 Presence of cerebrospinal fluid drainage device: Secondary | ICD-10-CM

## 2023-04-18 DIAGNOSIS — I700 Atherosclerosis of aorta: Secondary | ICD-10-CM | POA: Diagnosis not present

## 2023-04-18 DIAGNOSIS — J690 Pneumonitis due to inhalation of food and vomit: Secondary | ICD-10-CM | POA: Diagnosis present

## 2023-04-18 DIAGNOSIS — Z83438 Family history of other disorder of lipoprotein metabolism and other lipidemia: Secondary | ICD-10-CM

## 2023-04-18 DIAGNOSIS — T1490XA Injury, unspecified, initial encounter: Secondary | ICD-10-CM | POA: Diagnosis present

## 2023-04-18 DIAGNOSIS — R000 Tachycardia, unspecified: Secondary | ICD-10-CM | POA: Diagnosis present

## 2023-04-18 DIAGNOSIS — M47812 Spondylosis without myelopathy or radiculopathy, cervical region: Secondary | ICD-10-CM | POA: Diagnosis not present

## 2023-04-18 DIAGNOSIS — M21372 Foot drop, left foot: Secondary | ICD-10-CM | POA: Diagnosis present

## 2023-04-18 DIAGNOSIS — Z86011 Personal history of benign neoplasm of the brain: Secondary | ICD-10-CM

## 2023-04-18 DIAGNOSIS — M545 Low back pain, unspecified: Secondary | ICD-10-CM | POA: Diagnosis present

## 2023-04-18 DIAGNOSIS — R6510 Systemic inflammatory response syndrome (SIRS) of non-infectious origin without acute organ dysfunction: Secondary | ICD-10-CM | POA: Diagnosis not present

## 2023-04-18 DIAGNOSIS — L22 Diaper dermatitis: Secondary | ICD-10-CM | POA: Diagnosis present

## 2023-04-18 DIAGNOSIS — S2249XA Multiple fractures of ribs, unspecified side, initial encounter for closed fracture: Secondary | ICD-10-CM | POA: Diagnosis not present

## 2023-04-18 DIAGNOSIS — R197 Diarrhea, unspecified: Secondary | ICD-10-CM | POA: Diagnosis present

## 2023-04-18 DIAGNOSIS — R1310 Dysphagia, unspecified: Secondary | ICD-10-CM | POA: Diagnosis present

## 2023-04-18 DIAGNOSIS — E8809 Other disorders of plasma-protein metabolism, not elsewhere classified: Secondary | ICD-10-CM | POA: Diagnosis present

## 2023-04-18 DIAGNOSIS — Q8502 Neurofibromatosis, type 2: Secondary | ICD-10-CM | POA: Diagnosis not present

## 2023-04-18 DIAGNOSIS — D333 Benign neoplasm of cranial nerves: Secondary | ICD-10-CM | POA: Diagnosis not present

## 2023-04-18 DIAGNOSIS — Z7189 Other specified counseling: Secondary | ICD-10-CM | POA: Diagnosis not present

## 2023-04-18 DIAGNOSIS — I10 Essential (primary) hypertension: Secondary | ICD-10-CM | POA: Diagnosis not present

## 2023-04-18 DIAGNOSIS — A0472 Enterocolitis due to Clostridium difficile, not specified as recurrent: Secondary | ICD-10-CM | POA: Diagnosis not present

## 2023-04-18 DIAGNOSIS — Z981 Arthrodesis status: Secondary | ICD-10-CM

## 2023-04-18 DIAGNOSIS — R410 Disorientation, unspecified: Secondary | ICD-10-CM | POA: Diagnosis not present

## 2023-04-18 DIAGNOSIS — S22031D Stable burst fracture of third thoracic vertebra, subsequent encounter for fracture with routine healing: Secondary | ICD-10-CM

## 2023-04-18 DIAGNOSIS — R7303 Prediabetes: Secondary | ICD-10-CM

## 2023-04-18 DIAGNOSIS — I951 Orthostatic hypotension: Secondary | ICD-10-CM | POA: Diagnosis present

## 2023-04-18 DIAGNOSIS — R32 Unspecified urinary incontinence: Secondary | ICD-10-CM | POA: Diagnosis present

## 2023-04-18 DIAGNOSIS — R4189 Other symptoms and signs involving cognitive functions and awareness: Secondary | ICD-10-CM | POA: Diagnosis present

## 2023-04-18 DIAGNOSIS — Z8249 Family history of ischemic heart disease and other diseases of the circulatory system: Secondary | ICD-10-CM

## 2023-04-18 DIAGNOSIS — I69811 Memory deficit following other cerebrovascular disease: Secondary | ICD-10-CM

## 2023-04-18 DIAGNOSIS — F331 Major depressive disorder, recurrent, moderate: Secondary | ICD-10-CM | POA: Diagnosis not present

## 2023-04-18 DIAGNOSIS — R739 Hyperglycemia, unspecified: Secondary | ICD-10-CM | POA: Diagnosis not present

## 2023-04-18 DIAGNOSIS — S069X0S Unspecified intracranial injury without loss of consciousness, sequela: Secondary | ICD-10-CM

## 2023-04-18 DIAGNOSIS — S12530D Unspecified traumatic displaced spondylolisthesis of sixth cervical vertebra, subsequent encounter for fracture with routine healing: Secondary | ICD-10-CM

## 2023-04-18 DIAGNOSIS — E222 Syndrome of inappropriate secretion of antidiuretic hormone: Secondary | ICD-10-CM | POA: Diagnosis present

## 2023-04-18 DIAGNOSIS — S12591D Other nondisplaced fracture of sixth cervical vertebra, subsequent encounter for fracture with routine healing: Secondary | ICD-10-CM | POA: Diagnosis not present

## 2023-04-18 DIAGNOSIS — I69818 Other symptoms and signs involving cognitive functions following other cerebrovascular disease: Secondary | ICD-10-CM

## 2023-04-18 DIAGNOSIS — Z4682 Encounter for fitting and adjustment of non-vascular catheter: Secondary | ICD-10-CM | POA: Diagnosis not present

## 2023-04-18 DIAGNOSIS — A09 Infectious gastroenteritis and colitis, unspecified: Secondary | ICD-10-CM | POA: Diagnosis not present

## 2023-04-18 DIAGNOSIS — S12000A Unspecified displaced fracture of first cervical vertebra, initial encounter for closed fracture: Secondary | ICD-10-CM | POA: Diagnosis not present

## 2023-04-18 DIAGNOSIS — G510 Bell's palsy: Secondary | ICD-10-CM

## 2023-04-18 DIAGNOSIS — J9811 Atelectasis: Secondary | ICD-10-CM | POA: Diagnosis not present

## 2023-04-18 DIAGNOSIS — S2242XD Multiple fractures of ribs, left side, subsequent encounter for fracture with routine healing: Secondary | ICD-10-CM

## 2023-04-18 DIAGNOSIS — S12500D Unspecified displaced fracture of sixth cervical vertebra, subsequent encounter for fracture with routine healing: Secondary | ICD-10-CM

## 2023-04-18 DIAGNOSIS — Z85820 Personal history of malignant melanoma of skin: Secondary | ICD-10-CM

## 2023-04-18 DIAGNOSIS — S2220XD Unspecified fracture of sternum, subsequent encounter for fracture with routine healing: Secondary | ICD-10-CM

## 2023-04-18 DIAGNOSIS — S069XAA Unspecified intracranial injury with loss of consciousness status unknown, initial encounter: Principal | ICD-10-CM | POA: Diagnosis present

## 2023-04-18 DIAGNOSIS — Z7982 Long term (current) use of aspirin: Secondary | ICD-10-CM

## 2023-04-18 DIAGNOSIS — Z888 Allergy status to other drugs, medicaments and biological substances status: Secondary | ICD-10-CM

## 2023-04-18 DIAGNOSIS — Z9851 Tubal ligation status: Secondary | ICD-10-CM

## 2023-04-18 DIAGNOSIS — E43 Unspecified severe protein-calorie malnutrition: Secondary | ICD-10-CM

## 2023-04-18 DIAGNOSIS — Z8619 Personal history of other infectious and parasitic diseases: Secondary | ICD-10-CM

## 2023-04-18 DIAGNOSIS — E871 Hypo-osmolality and hyponatremia: Secondary | ICD-10-CM | POA: Diagnosis not present

## 2023-04-18 DIAGNOSIS — D62 Acute posthemorrhagic anemia: Secondary | ICD-10-CM | POA: Diagnosis present

## 2023-04-18 DIAGNOSIS — R4182 Altered mental status, unspecified: Secondary | ICD-10-CM | POA: Diagnosis present

## 2023-04-18 DIAGNOSIS — Z9889 Other specified postprocedural states: Secondary | ICD-10-CM

## 2023-04-18 DIAGNOSIS — R059 Cough, unspecified: Secondary | ICD-10-CM | POA: Diagnosis not present

## 2023-04-18 DIAGNOSIS — I69311 Memory deficit following cerebral infarction: Secondary | ICD-10-CM

## 2023-04-18 DIAGNOSIS — S13161D Dislocation of C5/C6 cervical vertebrae, subsequent encounter: Secondary | ICD-10-CM

## 2023-04-18 DIAGNOSIS — R220 Localized swelling, mass and lump, head: Secondary | ICD-10-CM | POA: Diagnosis not present

## 2023-04-18 LAB — BASIC METABOLIC PANEL
Anion gap: 8 (ref 5–15)
BUN: 18 mg/dL (ref 8–23)
CO2: 26 mmol/L (ref 22–32)
Calcium: 8.8 mg/dL — ABNORMAL LOW (ref 8.9–10.3)
Chloride: 98 mmol/L (ref 98–111)
Creatinine, Ser: 0.39 mg/dL — ABNORMAL LOW (ref 0.44–1.00)
GFR, Estimated: >60 mL/min (ref >60)
Glucose, Bld: 144 mg/dL — ABNORMAL HIGH (ref 70–99)
Potassium: 4.7 mmol/L (ref 3.5–5.1)
Sodium: 132 mmol/L — ABNORMAL LOW (ref 135–145)

## 2023-04-18 LAB — GLUCOSE, CAPILLARY
Glucose-Capillary: 136 mg/dL — ABNORMAL HIGH (ref 70–99)
Glucose-Capillary: 139 mg/dL — ABNORMAL HIGH (ref 70–99)
Glucose-Capillary: 156 mg/dL — ABNORMAL HIGH (ref 70–99)

## 2023-04-18 LAB — CBC
HCT: 29.8 % — ABNORMAL LOW (ref 36.0–46.0)
Hemoglobin: 9.6 g/dL — ABNORMAL LOW (ref 12.0–15.0)
MCH: 29.8 pg (ref 26.0–34.0)
MCHC: 32.2 g/dL (ref 30.0–36.0)
MCV: 92.5 fL (ref 80.0–100.0)
Platelets: 493 10*3/uL — ABNORMAL HIGH (ref 150–400)
RBC: 3.22 MIL/uL — ABNORMAL LOW (ref 3.87–5.11)
RDW: 13.9 % (ref 11.5–15.5)
WBC: 13.7 10*3/uL — ABNORMAL HIGH (ref 4.0–10.5)
nRBC: 0 % (ref 0.0–0.2)

## 2023-04-18 LAB — VITAMIN D 25 HYDROXY (VIT D DEFICIENCY, FRACTURES): Vit D, 25-Hydroxy: 39.94 ng/mL (ref 30–100)

## 2023-04-18 MED ORDER — MEGESTROL ACETATE 400 MG/10ML PO SUSP: 400.0000 mg | Freq: Every day | ORAL | Status: DC

## 2023-04-18 MED ORDER — MEGESTROL ACETATE 400 MG/10ML PO SUSP
400.0000 mg | Freq: Every day | ORAL | Status: DC
  Administered 2023-04-19: 400 mg
  Filled 2023-04-18 (×2): qty 10

## 2023-04-18 MED ORDER — OYSTER SHELL CALCIUM/D3 500-5 MG-MCG PO TABS
1.0000 | ORAL_TABLET | Freq: Every day | ORAL | Status: DC
  Administered 2023-04-19 – 2023-04-21 (×3): 1 via ORAL
  Filled 2023-04-18 (×3): qty 1

## 2023-04-18 MED ORDER — VITAMIN B-12 1000 MCG PO TABS
1000.0000 ug | ORAL_TABLET | Freq: Every day | ORAL | Status: DC
  Administered 2023-04-19 – 2023-04-22 (×4): 1000 ug via ORAL
  Filled 2023-04-18 (×4): qty 1

## 2023-04-18 MED ORDER — METOPROLOL TARTRATE 12.5 MG HALF TABLET
25.0000 mg | ORAL_TABLET | Freq: Two times a day (BID) | ORAL | Status: DC
  Administered 2023-04-18 – 2023-04-22 (×8): 25 mg via ORAL
  Filled 2023-04-18 (×8): qty 2

## 2023-04-18 MED ORDER — ARTIFICIAL TEARS OPHTHALMIC OINT: TOPICAL_OINTMENT | OPHTHALMIC | Status: DC | PRN

## 2023-04-18 MED ORDER — OSMOLITE 1.5 CAL PO LIQD
850.0000 mL | ORAL | Status: DC
  Administered 2023-04-18: 850 mL
  Filled 2023-04-18 (×2): qty 948
  Filled 2023-04-18 (×2): qty 1000
  Filled 2023-04-18: qty 948

## 2023-04-18 MED ORDER — METHOCARBAMOL 500 MG PO TABS: 500.0000 mg | ORAL_TABLET | Freq: Three times a day (TID) | ORAL | Status: DC | PRN

## 2023-04-18 MED ORDER — FREE WATER
200.0000 mL | Freq: Four times a day (QID) | Status: DC
  Administered 2023-04-18 – 2023-04-27 (×37): 200 mL

## 2023-04-18 MED ORDER — BUPROPION HCL 75 MG PO TABS
75.0000 mg | ORAL_TABLET | Freq: Two times a day (BID) | ORAL | Status: DC
  Administered 2023-04-18 – 2023-04-20 (×5): 75 mg via ORAL
  Filled 2023-04-18 (×8): qty 1

## 2023-04-18 MED ORDER — ENOXAPARIN SODIUM 30 MG/0.3ML IJ SOSY: 30.0000 mg | PREFILLED_SYRINGE | INTRAMUSCULAR | Status: DC

## 2023-04-18 MED ORDER — BISACODYL 10 MG RE SUPP: 10.0000 mg | Freq: Every day | RECTAL | Status: DC | PRN

## 2023-04-18 MED ORDER — OSMOLITE 1.5 CAL PO LIQD
850.0000 mL | ORAL | Status: DC
  Filled 2023-04-18 (×2): qty 948

## 2023-04-18 MED ORDER — METHOCARBAMOL 500 MG PO TABS
500.0000 mg | ORAL_TABLET | Freq: Three times a day (TID) | ORAL | Status: DC | PRN
Start: 2023-04-18 — End: 2023-04-18

## 2023-04-18 MED ORDER — FLEET ENEMA RE ENEM: 1.0000 | ENEMA | Freq: Once | RECTAL | Status: DC | PRN

## 2023-04-18 MED ORDER — OXYCODONE HCL 5 MG PO TABS: 5.0000 mg | ORAL_TABLET | Freq: Four times a day (QID) | ORAL | Status: DC | PRN

## 2023-04-18 MED ORDER — MUSCLE RUB 10-15 % EX CREA: 1.0000 | TOPICAL_CREAM | CUTANEOUS | Status: DC | PRN

## 2023-04-18 MED ORDER — ENSURE ENLIVE PO LIQD: 237.0000 mL | Freq: Two times a day (BID) | ORAL | Status: DC

## 2023-04-18 MED ORDER — LOSARTAN POTASSIUM 50 MG PO TABS
100.0000 mg | ORAL_TABLET | Freq: Every day | ORAL | Status: DC
  Administered 2023-04-19 – 2023-04-21 (×3): 100 mg via ORAL
  Filled 2023-04-18 (×3): qty 2

## 2023-04-18 MED ORDER — THIAMINE MONONITRATE 100 MG PO TABS
100.0000 mg | ORAL_TABLET | Freq: Every day | ORAL | Status: DC
  Administered 2023-04-19: 100 mg via ORAL
  Filled 2023-04-18: qty 1

## 2023-04-18 MED ORDER — ASPIRIN 81 MG PO CHEW
81.0000 mg | CHEWABLE_TABLET | Freq: Every day | ORAL | Status: DC
  Administered 2023-04-19 – 2023-04-22 (×4): 81 mg via ORAL
  Filled 2023-04-18 (×4): qty 1

## 2023-04-18 MED ORDER — ADULT MULTIVITAMIN W/MINERALS CH
1.0000 | ORAL_TABLET | Freq: Every day | ORAL | Status: DC
  Administered 2023-04-19 – 2023-04-22 (×4): 1 via ORAL
  Filled 2023-04-18 (×4): qty 1

## 2023-04-18 MED ORDER — LORATADINE 10 MG PO TABS
10.0000 mg | ORAL_TABLET | Freq: Every day | ORAL | Status: DC | PRN
Start: 2023-04-18 — End: 2023-04-22

## 2023-04-18 MED ORDER — HYDROCORTISONE (PERIANAL) 2.5 % EX CREA: 1.0000 | TOPICAL_CREAM | Freq: Four times a day (QID) | CUTANEOUS | Status: DC | PRN

## 2023-04-18 MED ORDER — ENOXAPARIN SODIUM 30 MG/0.3ML IJ SOSY
30.0000 mg | PREFILLED_SYRINGE | INTRAMUSCULAR | Status: DC
  Administered 2023-04-19 – 2023-04-27 (×9): 30 mg via SUBCUTANEOUS
  Filled 2023-04-18 (×9): qty 0.3

## 2023-04-18 MED ORDER — CARMEX CLASSIC LIP BALM EX OINT: 1.0000 | TOPICAL_OINTMENT | CUTANEOUS | Status: DC | PRN

## 2023-04-18 MED ORDER — PROCHLORPERAZINE 25 MG RE SUPP: 12.5000 mg | Freq: Four times a day (QID) | RECTAL | Status: DC | PRN

## 2023-04-18 MED ORDER — PROCHLORPERAZINE MALEATE 5 MG PO TABS: 5.0000 mg | ORAL_TABLET | Freq: Four times a day (QID) | ORAL | Status: DC | PRN

## 2023-04-18 MED ORDER — GABAPENTIN 250 MG/5ML PO SOLN
300.0000 mg | Freq: Three times a day (TID) | ORAL | Status: DC
  Administered 2023-04-18: 300 mg
  Filled 2023-04-18 (×2): qty 6

## 2023-04-18 MED ORDER — CHLORHEXIDINE GLUCONATE CLOTH 2 % EX PADS
6.0000 | MEDICATED_PAD | Freq: Every day | CUTANEOUS | Status: DC
  Administered 2023-04-19 – 2023-04-20 (×2): 6 via TOPICAL

## 2023-04-18 MED ORDER — PRAVASTATIN SODIUM 40 MG PO TABS
40.0000 mg | ORAL_TABLET | Freq: Every day | ORAL | Status: DC
  Administered 2023-04-19 – 2023-04-21 (×3): 40 mg via ORAL
  Filled 2023-04-18 (×3): qty 1

## 2023-04-18 MED ORDER — ASPIRIN 81 MG PO CHEW: 81.0000 mg | CHEWABLE_TABLET | Freq: Every day | ORAL | Status: DC

## 2023-04-18 MED ORDER — GABAPENTIN 300 MG PO CAPS
300.0000 mg | ORAL_CAPSULE | Freq: Three times a day (TID) | ORAL | Status: DC
  Administered 2023-04-18 – 2023-04-20 (×6): 300 mg via ORAL
  Filled 2023-04-18 (×6): qty 1

## 2023-04-18 MED ORDER — OSMOLITE 1.5 CAL PO LIQD
850.0000 mL | ORAL | Status: DC
  Filled 2023-04-18: qty 948

## 2023-04-18 MED ORDER — OSMOLITE 1.5 CAL PO LIQD: 1190.0000 mL | ORAL | Status: DC

## 2023-04-18 MED ORDER — PROCHLORPERAZINE EDISYLATE 10 MG/2ML IJ SOLN: 5.0000 mg | Freq: Four times a day (QID) | INTRAMUSCULAR | Status: DC | PRN

## 2023-04-18 MED ORDER — MELATONIN 3 MG PO TABS
3.0000 mg | ORAL_TABLET | Freq: Every day | ORAL | Status: DC
  Administered 2023-04-18 – 2023-04-20 (×3): 3 mg via ORAL
  Filled 2023-04-18 (×3): qty 1

## 2023-04-18 MED ORDER — PHENOL 1.4 % MT LIQD: 1.0000 | OROMUCOSAL | Status: DC | PRN

## 2023-04-18 MED ORDER — SALINE SPRAY 0.65 % NA SOLN: 1.0000 | NASAL | Status: DC | PRN

## 2023-04-18 MED ORDER — ARTIFICIAL TEARS OPHTHALMIC OINT
TOPICAL_OINTMENT | Freq: Four times a day (QID) | OPHTHALMIC | Status: DC
  Filled 2023-04-18: qty 3.5

## 2023-04-18 MED ORDER — HYDROCORTISONE 1 % EX CREA: 1.0000 | TOPICAL_CREAM | Freq: Three times a day (TID) | CUTANEOUS | Status: DC | PRN

## 2023-04-18 MED ORDER — ALUM & MAG HYDROXIDE-SIMETH 200-200-20 MG/5ML PO SUSP: 30.0000 mL | ORAL | Status: DC | PRN

## 2023-04-18 MED ORDER — DIPHENHYDRAMINE HCL 25 MG PO CAPS: 25.0000 mg | ORAL_CAPSULE | Freq: Four times a day (QID) | ORAL | Status: DC | PRN

## 2023-04-18 MED ORDER — ACETAMINOPHEN 325 MG PO TABS
325.0000 mg | ORAL_TABLET | ORAL | Status: DC | PRN
  Administered 2023-04-20 – 2023-04-21 (×2): 650 mg via ORAL
  Filled 2023-04-18 (×3): qty 2

## 2023-04-18 NOTE — Plan of Care (Signed)
   Problem: Education: Goal: Knowledge of General Education information will improve Description: Including pain rating scale, medication(s)/side effects and non-pharmacologic comfort measures Outcome: Progressing   Problem: Health Behavior/Discharge Planning: Goal: Ability to manage health-related needs will improve Outcome: Not Progressing   Problem: Clinical Measurements: Goal: Ability to maintain clinical measurements within normal limits will improve Outcome: Progressing
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
## 2023-04-18 NOTE — Consult Note (Signed)
 [facility] Liaison Note  04/18/2023  Rita Taylor 08/04/1940 [id number]  Location: RN Hospital Liaison screened the patient remotely at [facility].  Insurance: Medicare   Rita Taylor is a 83 y.o. female who is a Primary Care Patient of [place], Mar Daring, MD [facility]. The patient was screened for 7 and 30 day readmission hospitalization with noted medium risk score for unplanned readmission risk with 3 IP in 6 months.  The patient was assessed for potential Care Management service needs for post hospital transition for care coordination. Review of patient's electronic medical record reveals patient was admitted for Vestibular Schwannoma. Pt transitioned to CIR for inpt rehab. No anticipated needs via VBCI at this time.  VBCI Care Management/Population Health does not replace or interfere with any arrangements made by the Inpatient Transition of Care team.   For questions contact:   Elliot Cousin, RN, BSN Hospital Liaison [facility]   [facility], Population Health Office Hours MTWF  8:00 am-6:00 pm Direct Dial: [phone] mobile [email or web address]

## 2023-04-18 NOTE — Progress Notes (Signed)
 Inpatient Rehabilitation Care Coordinator Assessment and Plan Patient Details  Name: Rita Taylor MRN: [id number] Date of Birth: Feb 04, 1941  Today's Date: 04/18/2023  Hospital Problems: Principal Problem:   TBI (traumatic brain injury) [facility])  Past Medical History:  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[facility])    Postoperative anemia 07/22/2018   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[facility])    Past Surgical History:  Past Surgical History:  Procedure Laterality Date   ANTERIOR CERVICAL DECOMP/DISCECTOMY FUSION N/A 04/01/2023   Procedure: ANTERIOR CERVICAL DECOMPRESSION/DISCECTOMY FUSION 1 LEVEL;  Surgeon: Venetia Night, MD;  Location: [facility] ORS;  Service: Neurosurgery;  Laterality: N/A;  C5-6 ACDF   APPLICATION OF INTRAOPERATIVE CT SCAN N/A 04/01/2023   Procedure: APPLICATION OF INTRAOPERATIVE CT SCAN;  Surgeon: Venetia Night, MD;  Location: [facility] ORS;  Service: Neurosurgery;  Laterality: N/A;   bitubal ligation  1981   BREAST ENHANCEMENT SURGERY  1990   BROW LIFT Left 01/24/2020   Procedure: TARSORRHAPHY, LATERAL PLACEMENT LEFT LOWER LID;  Surgeon: Imagene Riches, MD;  Location: [facility];  Service: Ophthalmology;  Laterality: Left;   COLON SURGERY     COLONOSCOPY     ECTROPION REPAIR Left 03/02/2019   Procedure: ECTROPION REPAIR, EXTENSIVE AND TARSORRHAPHY, LATERAL PLACEMENT OF LEFT LOWER LID;  Surgeon: Imagene Riches, MD;  Location: [facility];  Service: Ophthalmology;  Laterality: Left;   FACIAL COSMETIC SURGERY     GYNECOLOGIC CRYOSURGERY     15 years ago, normal since then, was treated with antibiotics, Annual pap smears for the past 20 years   POSTERIOR CERVICAL FUSION/FORAMINOTOMY N/A 04/01/2023   Procedure: C4-T4 posterior fusion, ORIF C6, C7, T3 fractures;  Surgeon: Venetia Night, MD;   Location: [facility] ORS;  Service: Neurosurgery;  Laterality: N/A;  with Brainlab, Globus   TUMOR EXCISION Left 06/2018   ear   VENTRICULOPERITONEAL SHUNT Right 04/15/2023   Procedure: SHUNT INSERTION VENTRICULAR-PERITONEAL;  Surgeon: Venetia Night, MD;  Location: [facility] ORS;  Service: Neurosurgery;  Laterality: Right;   Social History:  reports that she has never smoked. She has never used smokeless tobacco. She reports current alcohol use of about 1.0 standard drink of alcohol per week. She reports that she does not use drugs.  Family / Support Systems Marital Status: Married How Long?: 65 years Patient Roles: Spouse, Parent Spouse/Significant Other: Husband- Rita Taylor Children: 3 children- Rita Taylor, Rita Taylor, and Rita Taylor Other Supports: none Anticipated Caregiver: Husband Rita Taylor; and nephew Rita Taylor Ability/Limitations of Caregiver: Pt will  d/ to home with her husband. Pt reports her nephew Rita Taylor wil be at her home all the time as well to help. Caregiver Availability: 24/7 Family Dynamics: Pt lives with her husband   Social History Preferred language: English Religion:  Cultural Background: Pt worked as an Print production planner until retirement Education: some Charity fundraiser - How often do you need to have someone help you when you read instructions, pamphlets, or other written material from your doctor or pharmacy?: Never Writes: Yes Employment Status: Retired Age Retired: 64 Marine scientist Issues: Denies Guardian/Conservator: Product manager- husband Rita Taylor    Abuse/Neglect Abuse/Neglect Assessment Can Be Completed: Yes Physical Abuse: Denies Verbal Abuse: Denies Sexual Abuse: Denies Exploitation of patient/patient's resources: Denies Self-Neglect: Denies   Patient response to: Social Isolation - How often do you feel  lonely or isolated from those around you?: Never   Emotional Status Pt's affect, behavior and adjustment status: Pt in good spirits at time of visit.  Slightly confused when answering SW questions. Recent Psychosocial Issues: Denies Psychiatric History: Denies Substance Abuse History: Denies   Patient / Family Perceptions, Expectations & Goals Pt/Family understanding of illness & functional limitations: Pt has general understanding of care needs Premorbid pt/family roles/activities: Independent Anticipated changes in roles/activities/participation: Assistance with ADLs/IADLs Pt/family expectations/goals: Pt goal is to work on walking   [employer]: None Premorbid Home Care/DME Agencies: None Transportation available at discharge: TBD Is the patient able to respond to transportation needs?: Yes In the past 12 months, has lack of transportation kept you from medical appointments or from getting medications?: No In the past 12 months, has lack of transportation kept you from meetings, work, or from getting things needed for daily living?: No Resource referrals recommended: Neuropsychology   Discharge Planning Living Arrangements: Spouse/significant other, Other relatives Support Systems: Spouse/significant other Type of Residence: Private residence Insurance Resources: Media planner (specify), Medicare (BCBS Supp) Financial Resources: Restaurant manager, fast food Screen Referred: No Living Expenses: Own Money Management: Patient Does the patient have any problems obtaining your medications?: No Home Management: Pt manages all home care needs Patient/Family Preliminary Plans: TBD Care Coordinator Barriers to Discharge: Decreased caregiver support, Lack of/limited family support Care Coordinator Anticipated Follow Up Needs: HH/OP Expected length of stay: 10-14 days   Clinical Impression SW familiar with pt as she is a return patient. Pt is not a Cytogeneticist. HCPOA- husband Rita Taylor; she is unsure on children's role on HCPOA/POA. DME: w/c, shower chair and RW.  SW will confirm there are no barriers to  discharge.   Damiah Mcdonald A Aubery Douthat 04/18/2023, 1:40 PM
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
## 2023-04-18 NOTE — Progress Notes (Signed)
   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 Answers questions appropriately  CNI except L facial droop (baseline).  Speech is at baseline MAEW Incisions with staples in place. There is dried blood on shunt valve site.    Assessment/Plan:  Rita Taylor is improved after shunt procedure  - mobilize - pain control - DVT prophylaxis ok to start - PTOT - brace on when OOB - restart ASA on 3/3 - dressings removed. Leave incisions open to air.  Manning Charity PA-C Department of Neurosurgery
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
## 2023-04-18 NOTE — Progress Notes (Signed)
 Inpatient Rehabilitation Admission Medication Review by a Pharmacist  A complete drug regimen review was completed for this patient to identify any potential clinically significant medication issues.  High Risk Drug Classes Is patient taking? Indication by Medication  Antipsychotic No   Anticoagulant Yes Lovenox: VTE ppx  Antibiotic No   Opioid Yes Oxycodone: pain  Antiplatelet Yes Aspirin: CAD  Hypoglycemics/insulin No   Vasoactive Medication Yes Losartan, Lopressor: HTN  Chemotherapy No   Other Yes Megestrol: appetite Pravastatin: HLD Tylenol, Lidoderm: pain Bupropion: depression MVI, B12, thiamine, Calcium w/vitD: vitamin/supplement Docusate, Miralax, Bisacodyl, fleet: constipation Gabapentin, Robaxin: pain/muscle spasm Benadryl: itching Compazine: nausea/vomiting Melatonin: sleep Claritin: allergies Mylanta: indigestion Anusol: hemorrhoids     Type of Medication Issue Identified Description of Issue Recommendation(s)  Drug Interaction(s) (clinically significant)     Duplicate Therapy     Allergy     No Medication Administration End Date     Incorrect Dose     Additional Drug Therapy Needed     Significant med changes from prior encounter (inform family/care partners about these prior to discharge).  Restart or discontinue as appropriate. Communicate medication changes with patient/family at discharge  Other       Clinically significant medication issues were identified that warrant physician communication and completion of prescribed/recommended actions by midnight of the next day:  No  Time spent performing this drug regimen review (minutes): 30  Thank you for allowing pharmacy to be a part of this patient's care.   Signe Colt, PharmD 04/18/2023 1:15 PM   **Pharmacist phone directory can be found on [email or web address] listed under [facility]**

## 2023-04-18 NOTE — Progress Notes (Addendum)
 Nutrition Follow-up  DOCUMENTATION CODES:   Underweight, Severe malnutrition in context of chronic illness  INTERVENTION:   -Continue dysphagia 2 diet -Ensure Enlive po BID, each supplement provides 350 kcal and 20 grams of protein.  -Transition to nocturnal feeds:   Osmolite 1.5 @ 85 ml/hr over 14 hour period (1800-0800)  200 ml free water flush every 6 hours  Tube feeding regimen provides 1785 kcal (100% of needs), 75 grams of protein, and 907 ml of H2O. Total free water: 1707 ml daily -Continue MVI with minerals daily -Continue 100 mg thiamine daily x 7 days   NUTRITION DIAGNOSIS:   Severe Malnutrition related to chronic illness as evidenced by severe fat depletion, severe muscle depletion, percent weight loss.  Ongoing  GOAL:   Patient will meet greater than or equal to 90% of their needs  Progressing   MONITOR:   TF tolerance  REASON FOR ASSESSMENT:   Consult Enteral/tube feeding initiation and management  ASSESSMENT:   Pt with medical history significant of HTN, HLD, diabetes, depression, melanoma, left foot drop, neurofibromatosis type II, recent neck fracture (S/PE of surgery), who presents with brain tumor.  2/14- s/p Anterior cervical diskectomy and fusion, insertion of biomechanical device 2/15- drain removed secondary to agitation 2/15- advanced to dysphagia 2 diet 2/16- diet downgraded to dysphagia 1 by RN due to safety concerns 2/17- s/p BSE- NPO 2/18- s/p BSE- advanced to dysphagia 2 diet 2/21 - transfer to CIR at [facility] for rehab 2/22 - wound VAC removed 2/26 - Cortrak placed (gastric); txr to [facility], TF initiated 2/28- s/p Placement of Ventriculoperitoneal Shunt  3/2- s/p BSE- dysphagia 2 diet with thin liquids   Reviewed I/O's: +1.3 L x 24 hours and +4.2 L since admission  UOP: 500 ml x 24 hours   Case discussed with SLP, who reports concern with pt's poor appetite while on continuous feeds. Noted megace has been added. SLP discussed  possibility of bolus feedings. Explained that because pt has a cortrak, bolus feedings would be difficult to administer due to small diameter of tube. RD will transition to nocturnal feeds in attempt to stimulate appetite.   Pt sleeping soundly at time of visit. No family present. Pt not as alert or talkative in comparison to prior visit. Pt did not respond to RD questions.   Plan to d/c back to CIR today.   Medications reviewed and include calcium with vitamin D, vitamin B-12, lovenox, megace, and thiamine.   Labs reviewed: Na: 132, K, Mg, and Phos WDL. CBGS: 137-157 (inpatient orders for glycemic control are none).    Diet Order:   Diet Order             DIET DYS 2 Fluid consistency: Thin  Diet effective now                   EDUCATION NEEDS:   Not appropriate for education at this time  Skin:  Skin Assessment: Reviewed RN Assessment  Last BM:  04/13/23  Height:   Ht Readings from Last 1 Encounters:  04/15/23 5\' 2"  (1.575 m)    Weight:   Wt Readings from Last 1 Encounters:  04/18/23 42.5 kg    Ideal Body Weight:  50 kg  BMI:  Body mass index is 17.14 kg/m.  Estimated Nutritional Needs:   Kcal:  1600-1800  Protein:  75-90 grams  Fluid:  > 1.6 L    Levada Schilling, RD, LDN, CDCES Registered Dietitian III Certified Diabetes Care and Education Specialist  If unable to reach this RD, please use "RD Inpatient" group chat on secure chat between hours of 8am-4 pm daily

## 2023-04-18 NOTE — Progress Notes (Signed)
 Inpatient Rehabilitation Discharge Medication Review by a Pharmacist Late Entry for discharge from Rehab on 04/13/2023 (was discharged to inpatient [facility])  A complete drug regimen review was completed for this patient to identify any potential clinically significant medication issues.  High Risk Drug Classes Is patient taking? Indication by Medication  Antipsychotic No   Anticoagulant No   Antibiotic No   Opioid Yes Oxycodone: pain  Antiplatelet Yes Aspirin: CAD  Hypoglycemics/insulin No   Vasoactive Medication Yes Losartan, Lopressor: HTN  Chemotherapy No   Other Yes Tylenol: pain Pravastatin: HLD Bupropion: depression Docusate, Miralax: bowel regimen B12, biotin, vitC, vitE, Calcium, VitD: vitamins/supplements Gabapentin: pain Lidoderm: pain     Type of Medication Issue Identified Description of Issue Recommendation(s)  Drug Interaction(s) (clinically significant)     Duplicate Therapy     Allergy     No Medication Administration End Date     Incorrect Dose     Additional Drug Therapy Needed     Significant med changes from prior encounter (inform family/care partners about these prior to discharge).  Restart or discontinue as appropriate. Communicate medication changes with patient/family at discharge  Other       Clinically significant medication issues were identified that warrant physician communication and completion of prescribed/recommended actions by midnight of the next day:  No   Time spent performing this drug regimen review (minutes): 30   Thank you for allowing pharmacy to be a part of this patient's care.   Signe Colt, PharmD 04/18/2023 9:41 AM   **Pharmacist phone directory can be found on [email or web address] listed under [facility]**

## 2023-04-18 NOTE — Progress Notes (Signed)
 Inpatient Rehab Admissions Coordinator:  There is a bed available at CIR for pt today. Dr. Lucianne Muss and Dr. Myer Haff are aware and in agreement. Pt, pt's son Dorene Sorrow, NSG, and TOC made aware.   Wolfgang Phoenix, MS, CCC-SLP Admissions Coordinator [phone]

## 2023-04-19 LAB — CBC WITH DIFFERENTIAL/PLATELET
Abs Immature Granulocytes: 0.08 10*3/uL — ABNORMAL HIGH (ref 0.00–0.07)
Basophils Absolute: 0.1 10*3/uL (ref 0.0–0.1)
Basophils Relative: 1 %
Eosinophils Absolute: 0 10*3/uL (ref 0.0–0.5)
Eosinophils Relative: 0 %
HCT: 29.5 % — ABNORMAL LOW (ref 36.0–46.0)
Hemoglobin: 9.8 g/dL — ABNORMAL LOW (ref 12.0–15.0)
Immature Granulocytes: 1 %
Lymphocytes Relative: 20 %
Lymphs Abs: 2.7 10*3/uL (ref 0.7–4.0)
MCH: 29.9 pg (ref 26.0–34.0)
MCHC: 33.2 g/dL (ref 30.0–36.0)
MCV: 89.9 fL (ref 80.0–100.0)
Monocytes Absolute: 1 10*3/uL (ref 0.1–1.0)
Monocytes Relative: 7 %
Neutro Abs: 9.9 10*3/uL — ABNORMAL HIGH (ref 1.7–7.7)
Neutrophils Relative %: 71 %
Platelets: 527 10*3/uL — ABNORMAL HIGH (ref 150–400)
RBC: 3.28 MIL/uL — ABNORMAL LOW (ref 3.87–5.11)
RDW: 14 % (ref 11.5–15.5)
WBC: 13.8 10*3/uL — ABNORMAL HIGH (ref 4.0–10.5)
nRBC: 0 % (ref 0.0–0.2)

## 2023-04-19 LAB — COMPREHENSIVE METABOLIC PANEL
ALT: 21 U/L (ref 0–44)
AST: 15 U/L (ref 15–41)
Albumin: 2.5 g/dL — ABNORMAL LOW (ref 3.5–5.0)
Alkaline Phosphatase: 176 U/L — ABNORMAL HIGH (ref 38–126)
Anion gap: 8 (ref 5–15)
BUN: 24 mg/dL — ABNORMAL HIGH (ref 8–23)
CO2: 26 mmol/L (ref 22–32)
Calcium: 8.3 mg/dL — ABNORMAL LOW (ref 8.9–10.3)
Chloride: 98 mmol/L (ref 98–111)
Creatinine, Ser: 0.55 mg/dL (ref 0.44–1.00)
GFR, Estimated: >60 mL/min (ref >60)
Glucose, Bld: 152 mg/dL — ABNORMAL HIGH (ref 70–99)
Potassium: 4.7 mmol/L (ref 3.5–5.1)
Sodium: 132 mmol/L — ABNORMAL LOW (ref 135–145)
Total Bilirubin: 0.5 mg/dL (ref 0.0–1.2)
Total Protein: 5.6 g/dL — ABNORMAL LOW (ref 6.5–8.1)

## 2023-04-19 LAB — GLUCOSE, CAPILLARY: Glucose-Capillary: 195 mg/dL — ABNORMAL HIGH (ref 70–99)

## 2023-04-19 LAB — HEMOGLOBIN A1C
Hgb A1c MFr Bld: 5.6 % (ref 4.8–5.6)
Mean Plasma Glucose: 114.02 mg/dL

## 2023-04-19 LAB — PROCALCITONIN: Procalcitonin: <0.1 ng/mL

## 2023-04-19 MED ORDER — OSMOLITE 1.5 CAL PO LIQD
1190.0000 mL | ORAL | Status: DC
  Administered 2023-04-19 – 2023-04-20 (×3): 1190 mL
  Filled 2023-04-19 (×4): qty 1422

## 2023-04-19 MED ORDER — INSULIN ASPART 100 UNIT/ML IJ SOLN
0.0000 [IU] | INTRAMUSCULAR | Status: DC
  Administered 2023-04-19 – 2023-04-20 (×3): 2 [IU] via SUBCUTANEOUS
  Administered 2023-04-20: 1 [IU] via SUBCUTANEOUS

## 2023-04-19 NOTE — Plan of Care (Signed)
  Problem: RH Balance Goal: LTG Patient will maintain dynamic standing balance (PT) Description: LTG:  Patient will maintain dynamic standing balance with assistance during mobility activities (PT) Flowsheets (Taken 04/19/2023 1609) LTG: Pt will maintain dynamic standing balance during mobility activities with:: Contact Guard/Touching assist   Problem: Sit to Stand Goal: LTG:  Patient will perform sit to stand with assistance level (PT) Description: LTG:  Patient will perform sit to stand with assistance level (PT) Flowsheets (Taken 04/19/2023 1609) LTG: PT will perform sit to stand in preparation for functional mobility with assistance level: Minimal Assistance - Patient > 75%   Problem: RH Bed Mobility Goal: LTG Patient will perform bed mobility with assist (PT) Description: LTG: Patient will perform bed mobility with assistance, with/without cues (PT). Flowsheets (Taken 04/19/2023 1609) LTG: Pt will perform bed mobility with assistance level of: Minimal Assistance - Patient > 75%   Problem: RH Bed to Chair Transfers Goal: LTG Patient will perform bed/chair transfers w/assist (PT) Description: LTG: Patient will perform bed to chair transfers with assistance (PT). Flowsheets (Taken 04/19/2023 1609) LTG: Pt will perform Bed to Chair Transfers with assistance level: Minimal Assistance - Patient > 75%   Problem: RH Car Transfers Goal: LTG Patient will perform car transfers with assist (PT) Description: LTG: Patient will perform car transfers with assistance (PT). Flowsheets (Taken 04/19/2023 1609) LTG: Pt will perform car transfers with assist:: Minimal Assistance - Patient > 75%   Problem: RH Ambulation Goal: LTG Patient will ambulate in controlled environment (PT) Description: LTG: Patient will ambulate in a controlled environment, # of feet with assistance (PT). Flowsheets (Taken 04/19/2023 1609) LTG: Pt will ambulate in controlled environ  assist needed:: Contact Guard/Touching assist LTG:  Ambulation distance in controlled environment: 100' Goal: LTG Patient will ambulate in home environment (PT) Description: LTG: Patient will ambulate in home environment, # of feet with assistance (PT). Flowsheets (Taken 04/19/2023 1609) LTG: Pt will ambulate in home environ  assist needed:: Contact Guard/Touching assist LTG: Ambulation distance in home environment: 50'   Problem: RH Stairs Goal: LTG Patient will ambulate up and down stairs w/assist (PT) Description: LTG: Patient will ambulate up and down # of stairs with assistance (PT) Flowsheets (Taken 04/19/2023 1609) LTG: Pt will ambulate up/down stairs assist needed:: Minimal Assistance - Patient > 75% LTG: Pt will  ambulate up and down number of stairs: 4 steps

## 2023-04-19 NOTE — Progress Notes (Signed)
 Patient ID: Rita Taylor, female   DOB: [birth year], 83 y.o.   MRN: [id number]  0915- SW spoke with pt son Rita Taylor to introduce self, explain role, and discuss discharge process. He reports initially the nephew Rita Taylor was only staying for the first few nights at the time of her initial admission, NOW, they have 24/7 caregivers with their father, and will use agency to assist with their mother as well. SW discussed asking agency if they will want to come in for family education once discharge date is set to observe to ensure they can provide care. Intends to remain in contact with agency. SW will follow-up with updates from team conference.  1229-SW left detailed message for pt son Rita Taylor providing updates from team conference, and d/c date 3/25. SW informed will provide continued updates.   SW made efforts to meet with pt but pt was sleeping.   Cecile Sheerer, MSW, LCSW Office: [phone] Cell: [phone] Fax: [phone]

## 2023-04-19 NOTE — Progress Notes (Signed)
 Inpatient Rehabilitation  Patient information reviewed and entered into eRehab system by Cheri Rous, OTR/L, Rehab Quality Coordinator.   Information including medical coding, functional ability and quality indicators will be reviewed and updated through discharge.

## 2023-04-19 NOTE — Progress Notes (Signed)
 Physical Therapy Session Note  Patient Details  Name: Rita Taylor MRN: [id number] Date of Birth: Jan 06, 1941  Today's Date: 04/19/2023 PT Individual Time: 1303-1330 PT Individual Time Calculation (min): 27 min   Short Term Goals: Week 1:  PT Short Term Goal 1 (Week 1): Pt will complete bed mobility with min assist PT Short Term Goal 2 (Week 1): Pt will complete transfers with mod assist and LRAD PT Short Term Goal 3 (Week 1): Pt will ambulate 20' with RW mod assist  Skilled Therapeutic Interventions/Progress Updates:  Pt presents in room in bed, moaning reporting cervical pain. Pt agreeable to PT session, session focused on bed mobility, transfer training, and positioning to promote improved upright tolerance. Pt completes bed mobility with elevated HOB with mod assist and mod verbal cues for sequencing task, assist for BLEs off bed and trunk to upright. Pt able to maintain seated balance with CGA, requires max assisst for scooting to EOB in preparation for transfer. Pt unable to initiate transfer with first attempt, therapist elevates EOB and pt able to complete sit to stand with mod assist and physical assist for placing BUEs on RW secondary to pt lethargy and poor attention to task. Pt ambulates 3' to recliner with RW max assist due to pt demonstrating retropulsion. Pt provided with pillows and folded blanket to encourage upright posiitoning due to pt demonstrating R lateral lean when upright. Vitals assessed to be WNL, BP 133/69, HR, 92. Pt remains seated upright in recliner with all needs within reach, call light in place, chair alarm donned and activated at end of session.  Therapy Documentation Precautions:  Precautions Precautions: Cervical, Fall Precaution Booklet Issued: No Recall of Precautions/Restrictions: Impaired Precaution/Restrictions Comments: cervical hard collar OOB and with activity, okay to be off to sleep and eat Required Braces or Orthoses: Cervical Brace Cervical  Brace: Hard collar Restrictions Weight Bearing Restrictions Per Provider Order: No    Therapy/Group: Individual Therapy  Edwin Cap PT, DPT 04/19/2023, 4:12 PM

## 2023-04-19 NOTE — Progress Notes (Signed)
 Initial Nutrition Assessment  DOCUMENTATION CODES:   Severe malnutrition in context of chronic illness  INTERVENTION:  Start 48 hour calorie count for objective data on pt's po intake Continue nocturnal tube feeds via Cortrak Osmolite 1.5 @ 85 ml/hr over 14 hour period (1800-0800) 200 ml free water flush every 6 hours Tube feeding regimen provides 1785 kcal (100% of needs), 75 grams of protein, and 907 ml of H2O. Total free water: 1707 ml daily Continue MVI with minerals daily Continue 100 mg thiamine daily as ordered  If unable to adequately meet nutrition needs would recommend transitioning back to continuous tube feeds. Osmolite 1.2 @ 60 ml/hr   NUTRITION DIAGNOSIS:   Severe Malnutrition related to chronic illness as evidenced by severe muscle depletion, severe fat depletion.  GOAL:   Patient will meet greater than or equal to 90% of their needs  MONITOR:   TF tolerance, PO intake  REASON FOR ASSESSMENT:   Consult Assessment of nutrition requirement/status  ASSESSMENT:   Pt with PMH of HTN, vestibular schwannoma s/p resection, diabetes, depression, left foot drop, neurofibromatosis type II, recent neck fracture (S/PE of surgery), who presents with brain tumor.  2/21: transfer to CIR at [facility] 2/26: Cortrak placed (gastric); txr to [facility], TF initiated  2/28- s/p Placement of Ventriculoperitoneal Shunt  3/3: Readmit to CIR  Per last RD note pt transitioned to nocturnal feeds yesterday to stimulate appetite during the day. Per MD note pt was refusing multiple supplements. Pt was not alert or talkative during visit. Pt was unable to recall if tube feedings were administered yesterday or stopped this morning. Observed pt's tube to be disconnected from pump. Pt unable to recall if she ate any food yesterday or today. Noted pt's breakfast tray waiting at door to be picked up. Tray appears to be untouched. Pt has documented meal completions of 0-25%. Pt not agreeable to any  nutrition supplements and shook her head "no" when asked if she would try a supplement if added to tray or came in between meals. Pt shook head "no" when asked if she would like any snacks ordered for in between meals.  Meds: vitamin D, B12, megace, MVI, vitamin B1  Labs: CBGs range from 136-156 in last 24 hours, BUN 24  NUTRITION - FOCUSED PHYSICAL EXAM:  Flowsheet Row Most Recent Value  Orbital Region Severe depletion  Upper Arm Region Severe depletion  Thoracic and Lumbar Region Severe depletion  Buccal Region Severe depletion  Temple Region Severe depletion  Clavicle Bone Region Severe depletion  Clavicle and Acromion Bone Region Severe depletion  Scapular Bone Region Severe depletion  Dorsal Hand Severe depletion  Patellar Region Severe depletion  Anterior Thigh Region Severe depletion  Posterior Calf Region Severe depletion  Edema (RD Assessment) Mild  Hair Reviewed  Eyes Reviewed  Mouth Reviewed  Skin Reviewed  Nails Reviewed       Diet Order:   Diet Order             DIET DYS 2 Fluid consistency: Thin  Diet effective now                   EDUCATION NEEDS:   Not appropriate for education at this time  Skin:  Skin Assessment: Reviewed RN Assessment  Last BM:  3/4-type 6  Height:   Ht Readings from Last 1 Encounters:  04/18/23 5\' 2"  (1.575 m)    Weight:   Wt Readings from Last 1 Encounters:  04/19/23 42.7 kg  BMI:  Body mass index is 17.22 kg/m.  Estimated Nutritional Needs:   Kcal:  1600-1800  Protein:  75-90 g  Fluid:  > 1.6 L    Maceo Pro, MS Dietetic Intern
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
## 2023-04-19 NOTE — Progress Notes (Signed)
 PROGRESS NOTE   Subjective/Complaints:  No acute complaints.  No events overnight.  Patient denies any pain, continues to eat very poorly, did not touch her lunch.  Denies any nausea, abdominal pain, constipation, lack of appetite, dizziness, headaches, or anything else interfering with her intakes.  Remains waxing and waning, confused per staff.  Consistent with prior inpatient rehab stay.  ROS: Denies fevers, chills, N/V, abdominal pain, constipation, diarrhea, SOB, cough, chest pain, new weakness or paraesthesias.    Objective:   No results found. Recent Labs    04/18/23 0147 04/19/23 0538  WBC 13.7* 13.8*  HGB 9.6* 9.8*  HCT 29.8* 29.5*  PLT 493* 527*   Recent Labs    04/18/23 0147 04/19/23 0538  NA 132* 132*  K 4.7 4.7  CL 98 98  CO2 26 26  GLUCOSE 144* 152*  BUN 18 24*  CREATININE 0.39* 0.55  CALCIUM 8.8* 8.3*    Intake/Output Summary (Last 24 hours) at 04/19/2023 6045 Last data filed at 04/19/2023 0400 Gross per 24 hour  Intake 817.83 ml  Output --  Net 817.83 ml        Physical Exam: Vital Signs Blood pressure (!) 136/57, pulse 100, temperature 98 F (36.7 C), temperature source Oral, resp. rate 17, height 5\' 2"  (1.575 m), weight 42.7 kg, SpO2 97%.   General: No apparent distress, Cachectic.  Laying in bed. HEENT: + staples over shunt site, oral mucosa dry Not wearing maimi J- at bedside . Left eye covered with tape/gauze, + NGT Neck: Supple without JVD or lymphadenopathy Heart: Reg rate and rhythm. Chest: CTA bilaterally without wheezes, rales, or rhonchi; no distress Abdomen: Soft, non-tender, non-distended, bowel sounds positive.  Extremities: No clubbing, cyanosis, or edema. Pulses are 2+ Psych: Flat, but cooperative.  Intermittently tearful. Skin:  bruises noted Anterior and posterior surgical incisions CDI Staples over cranial incisions covered in dried blood --stable intact Neuro:      Mental Status: AAO to person, place, year and month, not day,  + altered memory, L neglect--mild Speech/Languate: Naming and repetition intact, fluent, follows simple commands, delayed responses,  CRANIAL NERVES: EOMI R eye, decreased hearing L, L facial weakness-chronic     MOTOR: RUE: 4/5 Deltoid, 4/5 Biceps, 4/5 Triceps,4/5 Grip LUE: 3/5 Deltoid, 3/5 Biceps, 2+/5 Triceps, 3/5 Grip RLE: HF 3/5, KE 4/5, ADF 0/5, APF 1-2/5 LLE: HF 3/5, KE 4-/5, ADF 1/5, APF 1-2/5     SENSORY: Normal to touch all 4 extremities  unchanged 3-4  Assessment/Plan: 1. Functional deficits which require 3+ hours per day of interdisciplinary therapy in a comprehensive inpatient rehab setting. Physiatrist is providing close team supervision and 24 hour management of active medical problems listed below. Physiatrist and rehab team continue to assess barriers to discharge/monitor patient progress toward functional and medical goals  Care Tool:  Bathing              Bathing assist       Upper Body Dressing/Undressing Upper body dressing        Upper body assist      Lower Body Dressing/Undressing Lower body dressing            Lower body  assist       Toileting Toileting    Toileting assist       Transfers Chair/bed transfer  Transfers assist           Locomotion Ambulation   Ambulation assist              Walk 10 feet activity   Assist           Walk 50 feet activity   Assist           Walk 150 feet activity   Assist           Walk 10 feet on uneven surface  activity   Assist           Wheelchair     Assist               Wheelchair 50 feet with 2 turns activity    Assist            Wheelchair 150 feet activity     Assist          Blood pressure (!) 136/57, pulse 100, temperature 98 F (36.7 C), temperature source Oral, resp. rate 17, height 5\' 2"  (1.575 m), weight 42.7 kg, SpO2 97%.  Medical  Problem List and Plan: 1. 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  Antithrombotics: -DVT/anticoagulation:  Pharmaceutical: Lovenox added             -antiplatelet therapy: ASA to start today.   3. Pain Management:  tylenol prn 4. Mood/Behavior/Sleep:  LCSW to follow for evaluation and support.              -antipsychotic agents: N/A             -Continue Wellbutrin   5. Neuropsych/cognition: This patient is not fully capable of making decisions on her own behalf.  6. Skin/Wound Care: Routine pressure   7. Fluids/Electrolytes/Nutrition: Recheck CMET in am.              8. C6 Fx with instability/T3 burst Fx: Treated wit  ORIF and C4-T4 arthrodesis.              --C collar when out of bed.   9. HTN/Tachycardia: Will monitor BP/HR TID--continue Cozaar and lopressor.             --monitor for orthostatic changes.   10. Leucocytosis: Resolving--monitor for fevers or others signs of infection. WBC 13.7 3/3    -Stable 3-4 11. H/o Depression: Change Wellbutrin XL to 75 mg bid as cannot be crushed.  12. Dysphagia: On D2 diet with thin liquids via medicine cup--question provale cup?             --Will add full supervision and assist with meals. Has to be upright for meals.   13. Delirium: Will continue delirium precautions. Start sleep chart   - No sleep log overnight, labs today 14. H/o 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with recurrence: Size stable per NS.  15.Hyponatremia: Monitor for now--has fluctuated since surgery. NA 132 on 3/3             --  recheck Na level in am.--Stable 132.   3-5: Change fluid flushes to normal saline.  16. ABLA: Will monitor with serial checks.  HGB 9.6 3/3   - Stable 17. Pre-diabetes: Hgb A1C- 6.0 a year ago. Will recheck in am.  Monitor fasting BS for now.    -3-4: Add SSI overnight during tube feeds.  18. Severe malnutrition  --continue tube feeds at nights. Decrease to 65 cc/10 hours at nights             --per friends patient did not eat much at baseline. Now on D2 diet with fatigue             --will ask nursing to save food slips. Continue Megace.   -2-3: Dietary initiating calorie count.  LOS: 1 days A FACE TO FACE EVALUATION WAS PERFORMED  Rita Taylor 04/19/2023, 9:23 AM

## 2023-04-19 NOTE — Plan of Care (Signed)
  Problem: RH Swallowing Goal: LTG Patient will consume least restrictive diet using compensatory strategies with assistance (SLP) Description: LTG:  Patient will consume least restrictive diet using compensatory strategies with assistance (SLP) Flowsheets (Taken 04/19/2023 1542) LTG: Pt Patient will consume least restrictive diet using compensatory strategies with assistance of (SLP): Minimal Assistance - Patient > 75%   Problem: RH Cognition - SLP Goal: RH LTG Patient will demonstrate orientation with cues Description:  LTG:  Patient will demonstrate orientation to person/place/time/situation with cues (SLP)   Flowsheets (Taken 04/19/2023 1542) LTG Patient will demonstrate orientation to:  Person  Place  Time  Situation LTG: Patient will demonstrate orientation using cueing (SLP): Minimal Assistance - Patient > 75%   Problem: RH Problem Solving Goal: LTG Patient will demonstrate problem solving for (SLP) Description: LTG:  Patient will demonstrate problem solving for basic/complex daily situations with cues  (SLP) Flowsheets (Taken 04/19/2023 1542) LTG: Patient will demonstrate problem solving for (SLP): Basic daily situations LTG Patient will demonstrate problem solving for: Moderate Assistance - Patient 50 - 74%   Problem: RH Memory Goal: LTG Patient will demonstrate ability for day to day (SLP) Description: LTG:   Patient will demonstrate ability for day to day recall/carryover during cognitive/linguistic activities with assist  (SLP) Flowsheets (Taken 04/19/2023 1542) LTG: Patient will demonstrate ability for day to day recall: New information LTG: Patient will demonstrate ability for day to day recall/carryover during cognitive/linguistic activities with assist (SLP): Moderate Assistance - Patient 50 - 74%   Problem: RH Attention Goal: LTG Patient will demonstrate this level of attention during functional activites (SLP) Description: LTG:  Patient will will demonstrate this level of  attention during functional activites (SLP) Flowsheets (Taken 04/19/2023 1542) Patient will demonstrate during cognitive/linguistic activities the attention type of: Sustained Patient will demonstrate this level of attention during cognitive/linguistic activities in: Controlled LTG: Patient will demonstrate this level of attention during cognitive/linguistic activities with assistance of (SLP): Moderate Assistance - Patient 50 - 74% Number of minutes patient will demonstrate attention during cognitive/linguistic activities: 15 minutes

## 2023-04-20 LAB — GLUCOSE, CAPILLARY
Glucose-Capillary: 103 mg/dL — ABNORMAL HIGH (ref 70–99)
Glucose-Capillary: 127 mg/dL — ABNORMAL HIGH (ref 70–99)
Glucose-Capillary: 146 mg/dL — ABNORMAL HIGH (ref 70–99)
Glucose-Capillary: 155 mg/dL — ABNORMAL HIGH (ref 70–99)
Glucose-Capillary: 178 mg/dL — ABNORMAL HIGH (ref 70–99)

## 2023-04-20 MED ORDER — MEGESTROL ACETATE 400 MG/10ML PO SUSP
400.0000 mg | Freq: Two times a day (BID) | ORAL | Status: DC
  Administered 2023-04-20 – 2023-04-22 (×4): 400 mg
  Filled 2023-04-20 (×5): qty 10

## 2023-04-20 MED ORDER — INSULIN ASPART 100 UNIT/ML IJ SOLN
0.0000 [IU] | Freq: Three times a day (TID) | INTRAMUSCULAR | Status: DC
  Administered 2023-04-21: 2 [IU] via SUBCUTANEOUS
  Administered 2023-04-21: 1 [IU] via SUBCUTANEOUS
  Administered 2023-04-22 (×2): 2 [IU] via SUBCUTANEOUS
  Administered 2023-04-22 – 2023-04-23 (×3): 1 [IU] via SUBCUTANEOUS
  Administered 2023-04-23: 2 [IU] via SUBCUTANEOUS
  Administered 2023-04-24 (×2): 1 [IU] via SUBCUTANEOUS
  Administered 2023-04-25: 2 [IU] via SUBCUTANEOUS
  Administered 2023-04-25 – 2023-04-26 (×3): 1 [IU] via SUBCUTANEOUS

## 2023-04-20 MED ORDER — BUPROPION HCL 75 MG PO TABS
75.0000 mg | ORAL_TABLET | Freq: Two times a day (BID) | ORAL | Status: DC
  Administered 2023-04-21 – 2023-04-27 (×13): 75 mg
  Filled 2023-04-20 (×15): qty 1

## 2023-04-20 MED ORDER — ERYTHROMYCIN 5 MG/GM OP OINT
TOPICAL_OINTMENT | Freq: Every day | OPHTHALMIC | Status: DC
  Filled 2023-04-20: qty 3.5

## 2023-04-20 MED ORDER — MELATONIN 3 MG PO TABS
3.0000 mg | ORAL_TABLET | Freq: Every day | ORAL | Status: DC
  Administered 2023-04-21 – 2023-04-26 (×6): 3 mg
  Filled 2023-04-20 (×7): qty 1

## 2023-04-20 MED ORDER — ENSURE ENLIVE PO LIQD
237.0000 mL | Freq: Two times a day (BID) | ORAL | Status: DC
  Administered 2023-04-20 – 2023-04-21 (×2): 237 mL via ORAL

## 2023-04-20 NOTE — Patient Care Conference (Signed)
 Inpatient RehabilitationTeam Conference and Plan of Care Update Date: 04/19/2023   Time: 1052 am    Patient Name: Rita Taylor      Medical Record Number: [id number]  Date of Birth: 1940-06-18 Sex: Female         Room/Bed: 4M08C/4M08C-01 Payor Info: Payor: MEDICARE / Plan: MEDICARE PART A AND B / Product Type: *No Product type* /    Admit Date/Time:  04/18/2023 12:31 PM  Primary Diagnosis:  Obstructive hydrocephalus [facility])  Hospital Problems: Principal Problem:   Obstructive hydrocephalus (HCC) Active Problems:   Hyponatremia   ABLA (acute blood loss anemia)   Prediabetes   Delirium   Severe malnutrition [facility])    Expected Discharge Date: Expected Discharge Date: 05/10/23  Team Members Present: Physician leading conference: Dr. Elijah Birk Social Worker Present: Cecile Sheerer, LCSWA Nurse Present: Konrad Dolores, RN PT Present: Darrold Span, PT OT Present: Limmie Patricia, OT PPS Coordinator present : Fae Pippin, SLP     Current Status/Progress Goal Weekly Team Focus  Bowel/Bladder   incontinent of bowel and bladder   to be continent   bladder and bowel training every 4 hours    Swallow/Nutrition/ Hydration   eval pending           ADL's   Total A for all BADL tasks. Mod A stand pivot transfer.   Min A with follow up HHOT   activity tolerance/endurance, increasing participation in therapy sessions. Completing BADL tasks with Max A    Mobility   total assist bed mobility, max assist stand pivot transfers, gait in //bars 3 steps max assist   CGA ambulatory  barriers: fatigue, lethargy, pain; focus on transfer training, gait training, tolerance to OOB    Communication   eval pending            Safety/Cognition/ Behavioral Observations  eval pending            Pain   no complaints of pain   pain free   coughing and deep breathing exercises    Skin   open to air wounds from procedures   free from infection  wound care every shift       Discharge Planning:  Pt reports she will d/c to home with her husband with 24/7 caregivers.  SW will confirm there are no barriers to discharge.   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 Patient limited by fatigue,depression, neck pain, activity intolerance and lethargy.  Patient on target to meet rehab goals: no, patient requires total assistance with ADLs. Patient requires max assist with stand pivot transfers.Goals at discharge are set for min assist.    *See Care Plan and progress notes for long and short-term goals.   Revisions to Treatment Plan:  Dietary consult Calorie counting initiated Palliative consult  Teaching Needs: Safety, dietary recommendations, toileting, transfers,medications , etc.  Current Barriers to Discharge: Decreased caregiver support  Possible Resolutions to Barriers:  Family education     Medical Summary Current Status: medically complicated by cervical fracture, new VP shunt for hydrocephalus, cognitive deficits, poor pain control, poor PO intakes/dysphagia/malnutrition, poor energy conservation, elevated ALP, poor pain control, and hypertension  Barriers to Discharge: Behavior/Mood;Medical stability;Incontinence;Inadequate Nutritional Intake;Self-care education;Uncontrolled Hypertension;Uncontrolled Pain   Possible Resolutions to [employer]: Monitor vitals and labs, monitor wound sites and wound care per nursing, encourage p.o. intakes and weaning off cortrak, limit sedating medications and encourage daytime attention/concentration, regular vitals for orthostatic hypotension/hypertension and medication adjustments  Continued Need for Acute Rehabilitation Level of Care: The patient requires daily medical management by a physician with specialized training in physical medicine and rehabilitation for the following reasons: Direction of a  multidisciplinary physical rehabilitation program to maximize functional independence : Yes Medical management of patient stability for increased activity during participation in an intensive rehabilitation regime.: Yes Analysis of laboratory values and/or radiology reports with any subsequent need for medication adjustment and/or medical intervention. : Yes   I attest that I was present, lead the team conference, and concur with the assessment and plan of the team.   Gwenyth Allegra 04/20/2023, 9:33 AM

## 2023-04-20 NOTE — Progress Notes (Addendum)
 Calorie Count Note  48 hour calorie count ordered.  Diet: Dysphagia 1 Supplements: Ensure Enlive/Boost  Estimated Nutritional Needs:    Kcal:  1600-1800   Protein:  75-90 g   Fluid:  > 1.6 L  Pt not in room when meal ticket was retrieved. Observed pt had a case of Boost on bedside table. One bottle of Boost was opened but full and untouched. Per secure chat with RN, pt has not consumed any of the Boost yet but will attempt to today.  3/4 Lunch: no meal tickets retrieved 3/4 Dinner: 0%. Noted pt refused meal.  3/5 3/5 Breakfast: no meal tickets retrieved  Supplements: 0% Boost  Total intake: 0 kcal (0% of minimum estimated needs)  0 protein (0% of minimum estimated needs)  NUTRITION DIAGNOSIS:    Severe Malnutrition related to chronic illness as evidenced by severe muscle depletion, severe fat depletion.  -remains applicable   GOAL:    Patient will meet greater than or equal to 90% of their needs   INTERVENTION:  Continue 48 hour calorie count for objective data on pt's po intake Continue nocturnal tube feeds via Cortrak Osmolite 1.5 @ 85 ml/hr over 14 hour period (1800-0800) 200 ml free water flush every 6 hours Tube feeding regimen provides 1785 kcal (100% of needs), 75 grams of protein, and 907 ml of H2O. Total free water: 1707 ml daily Continue MVI with minerals daily Continue 100 mg thiamine daily as ordered If unable to adequately meet nutrition needs would recommend transitioning back to continuous tube feeds. Osmolite 1.2 @ 60 ml/hr    Maceo Pro, MS Dietetic Intern

## 2023-04-20 NOTE — Progress Notes (Signed)
 PROGRESS NOTE   Subjective/Complaints:  No acute complaints.  No events overnight.  Patient states she is sleeping well, thinks she ate breakfast this morning but tray is untouched at bedside. Normotensive, mild tachycardia overnight but otherwise stable LBM 3/4, liquid; incontinent of bowel, bladder shortly before provider arrival. P.o. intakes remain poor, almost nothing in the last 24 hours..  ROS: Denies fevers, chills, N/V, abdominal pain, constipation, diarrhea, SOB, cough, chest pain, new weakness or paraesthesias.    Objective:   No results found. Recent Labs    04/18/23 0147 04/19/23 0538  WBC 13.7* 13.8*  HGB 9.6* 9.8*  HCT 29.8* 29.5*  PLT 493* 527*   Recent Labs    04/18/23 0147 04/19/23 0538  NA 132* 132*  K 4.7 4.7  CL 98 98  CO2 26 26  GLUCOSE 144* 152*  BUN 18 24*  CREATININE 0.39* 0.55  CALCIUM 8.8* 8.3*    Intake/Output Summary (Last 24 hours) at 04/20/2023 0819 Last data filed at 04/20/2023 0610 Gross per 24 hour  Intake 856 ml  Output --  Net 856 ml        Physical Exam: Vital Signs Blood pressure (!) 136/53, pulse 91, temperature 98 F (36.7 C), resp. rate 17, height 5\' 2"  (1.575 m), weight 44 kg, SpO2 98%.   General: No apparent distress, Cachectic.  Laying in bed. HEENT: + staples over shunt site, oral mucosa dry Not wearing maimi J., + NGT Left eye tape/gauze, injected appearance with mild discharge.  Neck: Supple without JVD or lymphadenopathy Heart: Reg rate and rhythm. Chest: CTA bilaterally without wheezes, rales, or rhonchi; no distress Abdomen: Soft, non-tender, non-distended, bowel sounds positive.  Extremities: No clubbing, cyanosis, or edema.  Psych: Flat, but cooperative.   Skin:  bruises noted Anterior and posterior surgical incisions CDI Staples over cranial incisions  intact Neuro:     Mental Status: AAO to person, place, year and month, not day,  + altered  memory, L neglect--mild Speech/Languate: Naming and repetition intact, fluent, follows simple commands, delayed responses,  CRANIAL NERVES: EOMI R eye, decreased hearing L, L facial weakness-chronic     MOTOR: Moving all 4 extremities in bed, antigravity RUE: 4/5 Deltoid, 4/5 Biceps, 4/5 Triceps,4/5 Grip LUE: 3/5 Deltoid, 3/5 Biceps, 2+/5 Triceps, 3/5 Grip RLE: HF 3/5, KE 4/5, ADF 0/5, APF 1-2/5 LLE: HF 3/5, KE 4-/5, ADF 1/5, APF 1-2/5     SENSORY: Normal to touch all 4 extremities  unchanged 3-5  Assessment/Plan: 1. Functional deficits which require 3+ hours per day of interdisciplinary therapy in a comprehensive inpatient rehab setting. Physiatrist is providing close team supervision and 24 hour management of active medical problems listed below. Physiatrist and rehab team continue to assess barriers to discharge/monitor patient progress toward functional and medical goals  Care Tool:  Bathing        Body parts bathed by helper: Right arm, Left arm, Face, Chest, Abdomen, Front perineal area, Buttocks, Right upper leg, Left upper leg, Right lower leg, Left lower leg     Bathing assist Assist Level: Total Assistance - Patient < 25%     Upper Body Dressing/Undressing Upper body dressing   What is the  patient wearing?: Pull over shirt    Upper body assist Assist Level: Total Assistance - Patient < 25%    Lower Body Dressing/Undressing Lower body dressing      What is the patient wearing?: Incontinence brief, Pants     Lower body assist Assist for lower body dressing: Dependent - Patient 0%     Toileting Toileting    Toileting assist Assist for toileting: Dependent - Patient 0%     Transfers Chair/bed transfer  Transfers assist     Chair/bed transfer assist level: Maximal Assistance - Patient 25 - 49%     Locomotion Ambulation   Ambulation assist   Ambulation activity did not occur: Safety/medical concerns          Walk 10 feet  activity   Assist  Walk 10 feet activity did not occur: Safety/medical concerns        Walk 50 feet activity   Assist Walk 50 feet with 2 turns activity did not occur: Safety/medical concerns         Walk 150 feet activity   Assist Walk 150 feet activity did not occur: Safety/medical concerns         Walk 10 feet on uneven surface  activity   Assist Walk 10 feet on uneven surfaces activity did not occur: Safety/medical concerns         Wheelchair     Assist Is the patient using a wheelchair?: Yes Type of Wheelchair: Manual    Wheelchair assist level: Dependent - Patient 0%      Wheelchair 50 feet with 2 turns activity    Assist        Assist Level: Dependent - Patient 0%   Wheelchair 150 feet activity     Assist      Assist Level: Dependent - Patient 0%   Blood pressure (!) 136/53, pulse 91, temperature 98 F (36.7 C), resp. rate 17, height 5\' 2"  (1.575 m), weight 44 kg, SpO2 98%.  Medical Problem List and Plan: 1. 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  Antithrombotics: -DVT/anticoagulation:  Pharmaceutical: Lovenox added             -antiplatelet therapy: ASA to start today.   3. Pain Management:  tylenol prn  -3/5: DC gabapentin, oxycodone due to no complaints  4. Mood/Behavior/Sleep:  LCSW to follow for evaluation and support.              -antipsychotic agents: N/A             -Continue Wellbutrin   5. Neuropsych/cognition: This patient is not fully capable of making decisions on her own behalf.  6. Skin/Wound Care: Routine pressure   -Staple removal on 3/11-14  7. Fluids/Electrolytes/Nutrition: Recheck CMET in am.             -Hyponatremia as below, uptrending BUN/creatinine,  mostly being maintained on tube feeds.  Repeat on Thursday.  8. C6 Fx with instability/T3 burst Fx: Treated wit  ORIF and C4-T4 arthrodesis.              --C collar when out of bed.  9. HTN/Tachycardia: Will monitor BP/HR TID--continue Cozaar and lopressor.             --monitor for orthostatic changes.   -Normotensive    04/20/2023    9:15 PM 04/20/2023    6:09 PM 04/20/2023    1:29 PM  Vitals with BMI  Systolic 139 139 409  Diastolic 57 57 51  Pulse 93 93 92     10. Leucocytosis: Resolving--monitor for fevers or others signs of infection. WBC 13.7 3/3    -Stable 3-4  11. H/o Depression: Change Wellbutrin XL to 75 mg bid as cannot be crushed.  -3-5: May benefit from addition of mirtazapine for appetite and depression; will see first if increase Megace helps.  12. Dysphagia: On D2 diet with thin liquids via medicine cup--question provale cup?             --Will add full supervision and assist with meals. Has to be upright for meals.   13. Delirium: Will continue delirium precautions. Start sleep chart   - No sleep log overnight, labs today  -Sleeping overnight per reports.  -3-5: Limit deliriogenic medications, DC gabapentin, oxycodone.  14. H/o 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with recurrence: Size stable per NS.  15.Hyponatremia: Monitor for now--has fluctuated since surgery. NA 132 on 3/3             --recheck Na level in am.--Stable 132.   3-5: Recheck sodium in AM.  16. ABLA: Will monitor with serial checks.  HGB 9.6 3/3   - Stable 17. Pre-diabetes: Hgb A1C- 6.0 a year ago. Will recheck in am. Monitor fasting BS for now.    -3-4: Add SSI overnight during tube feeds.--Well-controlled. Recent Labs    04/20/23 0835 04/20/23 1212 04/20/23 1703  GLUCAP 146* 127* 103*     18. Severe malnutrition  --continue tube feeds at nights. Decrease to 65 cc/10 hours at nights             --per friends patient did not eat much at baseline. Now on D2 diet with fatigue             --will ask  nursing to save food slips. Continue Megace.   -3-3: Dietary initiating calorie count.  - 3/5: Increase megace to BID.  If no improvement, will need to discuss possible PEG tube. LOS: 2 days A FACE TO FACE EVALUATION WAS PERFORMED  Angelina Sheriff 04/20/2023, 8:19 AM

## 2023-04-20 NOTE — Progress Notes (Signed)
 Occupational Therapy Session Note  Patient Details  Name: Rita Taylor MRN: [id number] Date of Birth: November 04, 1940  Today's Date: 04/20/2023 OT Individual Time: 1415-1453 OT Individual Time Calculation (min): 38 min    Short Term Goals: Week 1:  OT Short Term Goal 1 (Week 1): Pt will complete toileting with Max A with use of DME as needed. OT Short Term Goal 2 (Week 1): Pt will complete LB dressing and bathing with Max A OT Short Term Goal 3 (Week 1): Patient will complete UB bathing and dressing with Max A OT Short Term Goal 4 (Week 1): Pt will complete oral care with Max A.  Skilled Therapeutic Interventions/Progress Updates:    Patient received sleeping soundly in bed with C-Collar in place.  Patient difficult to arouse.  Patient incontinent of both urine and stool in brief and unaware.  Assisted patient to roll left and right, and for bridging to aide with hygiene and brief change.  Patient declined getting out of bed multiple ties this session.  Patient had difficulty maintaining eyes open throughout session.  Patient at times conversive - and answering questions, making eye contact, and smiling, and at other times making monosyllabic sounds continuously.  When asked if in pain - patient indicates no pain.  Patient's C-Collar removed and patient repositioned in bed.  Bed alarm engaged and call bell in reach.  Reviewed use of call bell with patient - able to push red nurse call - and said "then they talk to you."  Patient does not seem fully aware of use of call bell.  Patient unable to locate TV button on remote, nor turn to visually locate TV on wall to Left of midline.     Therapy Documentation Precautions:  Precautions Precautions: Cervical, Fall Precaution Booklet Issued: No Recall of Precautions/Restrictions: Impaired Precaution/Restrictions Comments: cervical hard collar OOB and with activity, okay to be off to sleep and eat Required Braces or Orthoses: Cervical Brace Cervical  Brace: Hard collar Restrictions Weight Bearing Restrictions Per Provider Order: No  Pain: Pain Assessment Pain Score: Denies pain       Therapy/Group: Individual Therapy  Collier Salina 04/20/2023, 2:57 PM

## 2023-04-20 NOTE — Plan of Care (Signed)
  Problem: Consults Goal: RH GENERAL PATIENT EDUCATION Description: See Patient Education module for education specifics. Outcome: Progressing   Problem: RH BOWEL ELIMINATION Goal: RH STG MANAGE BOWEL WITH ASSISTANCE Description: STG Manage Bowel with supervision  Assistance. Outcome: Progressing   Problem: RH BOWEL ELIMINATION Goal: RH STG MANAGE BOWEL W/MEDICATION W/ASSISTANCE Description: STG Manage Bowel with Medication with Assistance. Outcome: Progressing   Problem: RH BLADDER ELIMINATION Goal: RH STG MANAGE BLADDER WITH ASSISTANCE Description: STG Manage Bladder With  supervision assistance Assistance Outcome: Progressing   Problem: RH SKIN INTEGRITY Goal: RH STG SKIN FREE OF INFECTION/BREAKDOWN Description: Manage skin free of infection with supervision Outcome: Progressing   Problem: RH SAFETY Goal: RH STG ADHERE TO SAFETY PRECAUTIONS W/ASSISTANCE/DEVICE Description: STG Adhere to Safety Precautions With Assistance/Device. Outcome: Progressing   Problem: RH KNOWLEDGE DEFICIT GENERAL Goal: RH STG INCREASE KNOWLEDGE OF SELF CARE AFTER HOSPITALIZATION Description: Manage knowledge increase knowledge of self care after hospitalization with supervision using educational materials provided. Outcome: Progressing   Problem: RH PAIN MANAGEMENT Goal: RH STG PAIN MANAGED AT OR BELOW PT'S PAIN GOAL Description: <4 w/ prns Outcome: Progressing

## 2023-04-20 NOTE — Progress Notes (Signed)
 Physical Therapy Session Note  Patient Details  Name: Rita Taylor MRN: [id number] Date of Birth: 1940-11-14  Today's Date: 04/20/2023 PT Individual Time: 4098-1191 + 1100-1202 PT Individual Time Calculation (min): 43 min + 62 min  Short Term Goals: Week 1:  PT Short Term Goal 1 (Week 1): Pt will complete bed mobility with min assist PT Short Term Goal 2 (Week 1): Pt will complete transfers with mod assist and LRAD PT Short Term Goal 3 (Week 1): Pt will ambulate 61' with RW mod assist  Skilled Therapeutic Interventions/Progress Updates:    SESSION 1: Pt presents in room in bed, awake and alert, agreeable to PT. Pt denies pain throughout session. Session focused on therapeutic activities to facilitate participation with self care tasks, as well as NMR to promote improved standing balance and midline orientation.  Pt completes rolling bilaterally with min assist and mod verbal cues for managing brief and periarea hygiene, pt incontinent of bowel and bladder, charted. Therapist dons pants max assist for threading pants with pt in supine, pt completes supine to sit EOB with cues for attendance to task and mod assist for trunk to upright. Pt then completes stand with max assist, requires total assist for managing pants over hips in standing, and completes stand step transfer to [facility] with max assist. Pt remains seated for extended rest break.  Pt completes x3 sit<>stands with max verbal cues for anterior wt shift, completed as NMR to facilitate standing balance with BUE support on RW. Pt requires max assist for activity for standing and maintaining upright posture due to retropulsion. Pt able to maintain standing 30-60 seconds with each stand. Pt requires extended seated rest breaks during which therapist educates pt on correcting balance and body mechanics, and RN present for medication administration.   Pt remains seated in WC with all needs within reach, cal light in place and chair alarm donned  and activated at end of session.    SESSION 2: Pt handoff from SLP in room with pt seated in WC. Pt denies pain at this time and agreeable to PT. Session focused on gait training and NMR for seated balance, core strength, seated tolerance, and attendance to task.  Pt transported to day room dependently for time management and energy conservation. Pt ambulates to mat with max assist for postural stability with pt demonstrating retropulsion, max assist for RW mgmt with turns, max verbal cues for attendance to task with pt demonstrating short shuffling steps and halted movements without manual facilitation to maintain momentum.  Pt completes NMR seated EOM (maintains seated EOM for 35 minutes) in unsupported sitting completed to promote seated balance, core/BUE/BLE strengthening including: - alternating marches BLE x20  - sit ups from modified reclined position x5 - palloff press 1# weight x10 - tapping 1# weight side to side x10 - LAQs x10 BLE - heel raise x20 - hip abduciton (seated clamshells, x10 without resistance, x10 with yellow tband for resistance) - sit to stands max assist x2, 30-60 second stands Pt requires max verbal/visual/tactile cues for attendance to task  Therapist completes gastroc/soleous stretch bilaterally 2x60 seconds with gentle grade 1 AP mobilizations to BLE to improve DF ROM needed for postural stability and gait. Pt noted to have minor improvement in DF to neutral following intervention.  Pt ambulates back to [facility] with max assist for postural stability with pt demonstrating retropulsion as well as requiring weightshifitng bilaterally for backing up to sit in WC, max assist for RW mgmt with turns, max verbal  cues for attendance to task with pt demonstrating short shuffling steps and halted movements without manual facilitation to maintain momentum. Pt returns to sitting in WC.   Pt returns to room, requires max to total assist for stand pivot from [facility] to recliner to  promote improved tolerance to upright as well as positioning in upright as pt with tendency for fade to R, pillows placed for positioning and comfort. Pt remains seated in recliner with all needs within reach, cal light in place and chair alarm donned and activated at end of session.   Therapy Documentation Precautions:  Precautions Precautions: Cervical, Fall Precaution Booklet Issued: No Recall of Precautions/Restrictions: Impaired Precaution/Restrictions Comments: cervical hard collar OOB and with activity, okay to be off to sleep and eat Required Braces or Orthoses: Cervical Brace Cervical Brace: Hard collar Restrictions Weight Bearing Restrictions Per Provider Order: No   Therapy/Group: Individual Therapy  Edwin Cap PT, DPT 04/20/2023, 9:50 AM

## 2023-04-20 NOTE — Progress Notes (Addendum)
 Speech Language Pathology Daily Session Note  Patient Details  Name: Rita Taylor MRN: [id number] Date of Birth: [birth year]  Today's Date: 04/20/2023 SLP Individual Time: 1000-1100 SLP Individual Time Calculation (min): 60 min  Short Term Goals: Week 1: SLP Short Term Goal 1 (Week 1): Pt will demonstrate improved mastication during trials of D3 diet textures to determine the potential for diet texture upgrade. SLP Short Term Goal 2 (Week 1): Pt will complete functional problem solving tasks with max A when provided visual and/or verbal cues. SLP Short Term Goal 3 (Week 1): Pt will recall functional information in the current environment using compensatory memory strategies when provided max A. SLP Short Term Goal 4 (Week 1): Patient will sustain attention to task for 10 minutes given max multimodal A.  Skilled Therapeutic Interventions:   A variety of tx tasks were provided targeting dysphagia and cognition. The pt was greeted in her room. She was awake in her chair whimpering upon SLP arrival, but required cues to open her eyes and respond to SLP. She also benefited from maxA to reposition in the chair d/t slouched state and contorted neck positioning despite cervical collar. In fact, she required nearly constant repositioning d/t pushing forward/subsequent sliding in her chair. This negatively impacted swallowing during PO trials, though SLP was unable to maintain adequate positioning despite constant cueing. PO trials comprised of thin liquids via 1/2 straw, thin liquids via regular straw, slightly thick liquids via regular straw, slightly thick liquids via 1/2 straw, and nectar thick liquids via 1/2 straw. She presented w/ constant coughing during thin liquid and slightly thick liquid trials. Anticipate delayed swallow initiation and posterior head tilt negatively impacted success, as pt noted to consume large sips of liquid which resulted in either an immediate cough before the swallow or  delayed cough after holding the liquids in her mouth for ~2-5 seconds. Consistent wet vocal quality noted as well. She presented w/ persistent wincing while swallowing, though unable to report on sensation further. No difference in performance noted between 1/2 straw and regular straw. Cough x1 noted across 5 trials of mildly thick liquids. Pt able to consume meds crushed in pureed textures with no overt s/s of aspiration. CNA reported minimal success with Dys 2 textures yesterday and this morning. She also endorsed pt wincing with all PO intake, though slightly improved intake noted overall with pureed textures. Given severity of cognitive deficits, recommend downgrade to pureed textures and mildly thick liquids. Continue with 1/2 straw via medicine cup, though regular straws are ok if needed.  Plan to complete MBSS 04/22/23 to determine additional recommendations for least restrictive diet. Recommend cont ST per POC.   Pain Pain Assessment Pain Scale: Faces Faces Pain Scale: Hurts little more Pain Location: Neck Pain Intervention(s): Medication (See eMAR)  Therapy/Group: Individual Therapy  Pati Gallo 04/20/2023, 11:21 AM

## 2023-04-21 ENCOUNTER — Inpatient Hospital Stay (HOSPITAL_COMMUNITY)

## 2023-04-21 LAB — URINALYSIS, W/ REFLEX TO CULTURE (INFECTION SUSPECTED)
Bacteria, UA: NONE SEEN
Bilirubin Urine: NEGATIVE
Glucose, UA: NEGATIVE mg/dL
Hgb urine dipstick: NEGATIVE
Ketones, ur: NEGATIVE mg/dL
Leukocytes,Ua: NEGATIVE
Nitrite: NEGATIVE
Protein, ur: NEGATIVE mg/dL
Specific Gravity, Urine: 1.017 (ref 1.005–1.030)
pH: 8 (ref 5.0–8.0)

## 2023-04-21 LAB — SODIUM, URINE, RANDOM: Sodium, Ur: 90 mmol/L

## 2023-04-21 LAB — OSMOLALITY: Osmolality: 287 mosm/kg (ref 275–295)

## 2023-04-21 LAB — BASIC METABOLIC PANEL
Anion gap: 3 — ABNORMAL LOW (ref 5–15)
BUN: 22 mg/dL (ref 8–23)
CO2: 24 mmol/L (ref 22–32)
Calcium: 7.2 mg/dL — ABNORMAL LOW (ref 8.9–10.3)
Chloride: 102 mmol/L (ref 98–111)
Creatinine, Ser: 0.55 mg/dL (ref 0.44–1.00)
GFR, Estimated: >60 mL/min (ref >60)
Glucose, Bld: 163 mg/dL — ABNORMAL HIGH (ref 70–99)
Potassium: 4.7 mmol/L (ref 3.5–5.1)
Sodium: 129 mmol/L — ABNORMAL LOW (ref 135–145)

## 2023-04-21 LAB — GLUCOSE, CAPILLARY
Glucose-Capillary: 164 mg/dL — ABNORMAL HIGH (ref 70–99)
Glucose-Capillary: 127 mg/dL — ABNORMAL HIGH (ref 70–99)
Glucose-Capillary: 113 mg/dL — ABNORMAL HIGH (ref 70–99)

## 2023-04-21 LAB — OSMOLALITY, URINE: Osmolality, Ur: 632 mosm/kg (ref 300–900)

## 2023-04-21 MED ORDER — OYSTER SHELL CALCIUM/D3 500-5 MG-MCG PO TABS
1.0000 | ORAL_TABLET | Freq: Three times a day (TID) | ORAL | Status: DC
  Administered 2023-04-21 – 2023-04-22 (×4): 1 via ORAL
  Filled 2023-04-21 (×5): qty 1

## 2023-04-21 MED ORDER — SODIUM CHLORIDE 1 G PO TABS
1.0000 g | ORAL_TABLET | Freq: Two times a day (BID) | ORAL | Status: DC
  Administered 2023-04-21 – 2023-04-27 (×12): 1 g via NASOGASTRIC
  Filled 2023-04-21 (×12): qty 1

## 2023-04-21 MED ORDER — OSMOLITE 1.5 CAL PO LIQD
1000.0000 mL | ORAL | Status: DC
  Administered 2023-04-21 – 2023-04-27 (×8): 1000 mL
  Filled 2023-04-21 (×9): qty 1000

## 2023-04-21 MED ORDER — LOSARTAN POTASSIUM 50 MG PO TABS
50.0000 mg | ORAL_TABLET | Freq: Every day | ORAL | Status: DC
  Administered 2023-04-22: 50 mg via ORAL
  Filled 2023-04-21: qty 1

## 2023-04-21 NOTE — Progress Notes (Signed)
 Occupational Therapy Session Note  Patient Details  Name: Rita Taylor MRN: [id number] Date of Birth: [birth year]  Today's Date: 04/21/2023 OT Individual Time: 4696-2952 OT Individual Time Calculation (min): 72 min    Short Term Goals: Week 1:  OT Short Term Goal 1 (Week 1): Pt will complete toileting with Max A with use of DME as needed. OT Short Term Goal 2 (Week 1): Pt will complete LB dressing and bathing with Max A OT Short Term Goal 3 (Week 1): Patient will complete UB bathing and dressing with Max A OT Short Term Goal 4 (Week 1): Pt will complete oral care with Max A.  Skilled Therapeutic Interventions/Progress Updates:   Patient received lying on her back with knees pulled up to her chest, marginally awake but eyes closed.  Patient opened eyes to name being called - responded initially with grunts and groans, but with encouragement responded with simple words and phrases.  Patient able to identify place as hospital from choice of 3.  Patient's brief wet and soiled, and assisted to clean up and into clean brief.  Patient assisted with rolling once movement initiated, same with bridging.  Assisted patient into c-collar.  Patient assisted to edge of bed, needs facilitation to maintain feet on floor in prep for transfer to wheelchair.  Maintained upright position in wheelchair for most of this session to promote more opportunity for trunk extension to support weight of head.  At sink, patient minimally responsive to bathing - however following cues to lean forward, stand up to aide with bathing and dressing process.  Attempted to help patient with breakfast - however, patient just completed tube feeding and showing no interest in food.  Accepted two bites of food, then maintained closed lips when utensil brought toward mouth.  Attempted patient bringing cup to mouth to increase participation without success.  Patient taken out of room in wheelchair for increased or different stimulus.   Encouraged eyes open and flexing forward in chair.  Patient complains of neck and left shoulder pain - alerted nursing, who came to deliver pain medications.  Patient transferred to recliner chair to allow her to be upright but have increased head support.   Safety belt in place and engaged and call bell/ personal items in reach.  Direct hand off to nursing.    Therapy Documentation Precautions:  Precautions Precautions: Cervical, Fall Precaution Booklet Issued: No Recall of Precautions/Restrictions: Impaired Precaution/Restrictions Comments: cervical hard collar OOB and with activity, okay to be off to sleep and eat Required Braces or Orthoses: Cervical Brace Cervical Brace: Hard collar Restrictions Weight Bearing Restrictions Per Provider Order: No   Pain: Pain Assessment Pain Scale: Faces Pain Location: Shoulder Pain Intervention(s): Medication (See eMAR)      Therapy/Group: Individual Therapy  Collier Salina 04/21/2023, 8:46 AM

## 2023-04-21 NOTE — Progress Notes (Addendum)
 Calorie Count Note  48 hour calorie count ordered.  Diet: dysphagia 1 Supplements: Ensure Enlive/Boost Plus  Estimated Nutritional Needs:    Kcal:  1600-1800   Protein:  75-90 g   Fluid:  > 1.6 L  Per chart review, pt did not progress with dysphagia 2 trial yesterday. SLP will repeat MBS 3/7.  3/5 Lunch: 164 kcal, 4 g protein  3/5 Dinner: 0%; pt refused 3/6 breakfast: no tickets retrieved Supplements: none  Total intake: 164 kcal (10% of minimum estimated needs)  4 g protein (5% of minimum estimated needs)  NUTRITION DIAGNOSIS:    Severe Malnutrition related to chronic illness as evidenced by severe muscle depletion, severe fat depletion.   -remains applicable   GOAL:    Patient will meet greater than or equal to 90% of their needs  -met through TF   INTERVENTION:  Discontinue calorie count.  Discontinue Ensure Enlive Pt not adequately meeting nutrition needs. Will transition back to continuous tube feeds. Osmolite 1.5 @ 60 ml/hr TF provides 1800 kcal, 75 g protein ( 1200 ml/day)   FWF 200 ml every 6 hours   Maceo Pro, MS Dietetic Intern

## 2023-04-21 NOTE — Progress Notes (Signed)
 Physical Therapy Session Note  Patient Details  Name: Rita Taylor MRN: [id number] Date of Birth: 02/13/1941  Today's Date: 04/21/2023 PT Individual Time: 0950-1100 PT Individual Time Calculation (min): 70 min   Short Term Goals: Week 1:  PT Short Term Goal 1 (Week 1): Pt will complete bed mobility with min assist PT Short Term Goal 2 (Week 1): Pt will complete transfers with mod assist and LRAD PT Short Term Goal 3 (Week 1): Pt will ambulate 53' with RW mod assist  Skilled Therapeutic Interventions/Progress Updates:    Pt presents in room seated in [facility], alert and agreeable to PT. Pt denies pain at this time. Session focused on transfer training, gait training, and tolerance to activity.  Pt completes sit to stand transfer to RW max assist however unable to maintain standing and sits back to WC. Pt completes stand step transfer with max to total assist with pt demonstrating inability to step, demonstrates fearful muscle guarding with therapist attempt to wt shift.  Pt transported dependently to day room via [facility] for time management. Therapist provides RLE gastroc/soleous stretch x60 seconds each and gentle grade 2 mobilizations to promote improved DF ROM needed for ambulation.  Pt ambulates 30' with RW heavy max assist to maintain upright posture due to retropulsion as well as to prevent impulsive sitting, max assist to maintain momentum with pt demonstrating short shuffling gait.  Pt vitals assessed prior to standing frame in sitting pt demonstrating BP 135/59, HR 86, assessed after 2 minutes standing BP 122/60, HR 91. Pt maintains standing in standing frame 3 trials total (1 minute and 2x2 minutes) with max verbal cues for positioning and looking up with pt maintaining eyes closed. Pt comes to sitting with standing frame with max assist and max verbal cues as pt demonstrating fear of falling while mechanism assisting with sit, pt demonstrating global muscle guarding and improved anterior wt  shift to prevent sit. Pt requires extended seated rest breaks between trials due to fatigue with pt maintaining eyes closed when not in standing.  Pt returns to room dependently via WC, requires max assist for stand pivot transfer wc>bed (pt requires increased time due to lethargy) with pt demonstrating improved standing posture. Pt requires total assist for sit to supine despite max cues for sequencing, pt unable to initiate bed mobility. Pt remains supine in bed with all needs within reach, cal light in place and bed alarm activated at end of session.   Therapy Documentation Precautions:  Precautions Precautions: Cervical, Fall Precaution Booklet Issued: No Recall of Precautions/Restrictions: Impaired Precaution/Restrictions Comments: cervical hard collar OOB and with activity, okay to be off to sleep and eat Required Braces or Orthoses: Cervical Brace Cervical Brace: Hard collar Restrictions Weight Bearing Restrictions Per Provider Order: No   Therapy/Group: Individual Therapy  Edwin Cap PT, DPT 04/21/2023, 3:12 PM

## 2023-04-21 NOTE — Progress Notes (Signed)
 Speech Language Pathology Daily Session Note  Patient Details  Name: AVREE SZCZYGIEL MRN: [id number] Date of Birth: 09-27-1940  Today's Date: 04/21/2023 SLP Individual Time: 4098-1191 SLP Individual Time Calculation (min): 48 min and Today's Date: 04/21/2023 SLP Missed Time:  12 Missed Time Reason:  fatigue   Short Term Goals: Week 1: SLP Short Term Goal 1 (Week 1): Pt will demonstrate improved mastication during trials of D3 diet textures to determine the potential for diet texture upgrade. SLP Short Term Goal 2 (Week 1): Pt will complete functional problem solving tasks with max A when provided visual and/or verbal cues. SLP Short Term Goal 3 (Week 1): Pt will recall functional information in the current environment using compensatory memory strategies when provided max A. SLP Short Term Goal 4 (Week 1): Patient will sustain attention to task for 10 minutes given max multimodal A.  Skilled Therapeutic Interventions:   Pt greeted at bedside. She was curled up in the fetal position in bed upon SLP arrival. SLP facilitated a variety of tx tasks targeting dysphagia and cognition. During initial conversation, increased hoarse vocal quality noted as compared to prev tx session. Pt required max-DEP cues for attention during conversational tasks, including orientation q&a. She was able to recall the month and year. She also recalled that she was in a hospital, though unable to recall correct city. Prior to PO trials, SLP set up suctioning and encouraged oral care via suctioning d/t visible dried secretion build up. When handed the toothbrush, she did attempt brushing motion, but remained outside of her oral cavity and then quickly abandoned task. SLP completed remainder of oral care (maxA). Pt noted to intermittently bite brush and/or attempt to lick the toothbrush, demonstrating reduced awareness of task purpose. Upon completion of oral care, PO trials with thin liquids (water) and slightly thick liquids  (boost) via 1/2 straw were completed. No bolus holding noted this date, though pt continued to present with frequent coughing. She presented w/ delayed cough during 3/4 slightly thick trials and delayed cough x2 and immediate cough x1 across 4 trials of thin liquids. Increased wincing as compared to prev tx session. Of note, only ~2 oz were consumed in total. Anticipate cognition significantly impacts oropharyngeal swallow, however, MBSS scheduled for 3/7 to further evaluate her swallow and r/o aspiration. Pt was unable to remain awake for continued trials and/or additional cognitive tasks. She was left in bed with the alarm set and call light within reach. Recommend cont ST.   Pain  No pain reported  Therapy/Group: Individual Therapy  Pati Gallo 04/21/2023, 3:02 PM

## 2023-04-21 NOTE — Progress Notes (Signed)
 Orthopedic Tech Progress Note Patient Details:  Rita Taylor 1940-08-25 [id number]  Spoke with RN about not carrying [facility] J collars, she stated materials did not carry them either. I will let her know ED has PEDS COLLARS   Patient ID: Rita Taylor, female   DOB: 09-04-1940, 83 y.o.   MRN: [id number]  Donald Pore 04/21/2023, 4:08 PM
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
## 2023-04-22 ENCOUNTER — Inpatient Hospital Stay (HOSPITAL_COMMUNITY)

## 2023-04-22 LAB — CBC WITH DIFFERENTIAL/PLATELET
Abs Immature Granulocytes: 0.08 10*3/uL — ABNORMAL HIGH (ref 0.00–0.07)
Basophils Absolute: 0.1 10*3/uL (ref 0.0–0.1)
Basophils Relative: 0 %
Eosinophils Absolute: 0.1 10*3/uL (ref 0.0–0.5)
Eosinophils Relative: 1 %
HCT: 29 % — ABNORMAL LOW (ref 36.0–46.0)
Hemoglobin: 9.3 g/dL — ABNORMAL LOW (ref 12.0–15.0)
Immature Granulocytes: 1 %
Lymphocytes Relative: 13 %
Lymphs Abs: 1.8 10*3/uL (ref 0.7–4.0)
MCH: 29.4 pg (ref 26.0–34.0)
MCHC: 32.1 g/dL (ref 30.0–36.0)
MCV: 91.8 fL (ref 80.0–100.0)
Monocytes Absolute: 1 10*3/uL (ref 0.1–1.0)
Monocytes Relative: 7 %
Neutro Abs: 10.9 10*3/uL — ABNORMAL HIGH (ref 1.7–7.7)
Neutrophils Relative %: 78 %
Platelets: 524 10*3/uL — ABNORMAL HIGH (ref 150–400)
RBC: 3.16 MIL/uL — ABNORMAL LOW (ref 3.87–5.11)
RDW: 14.3 % (ref 11.5–15.5)
WBC: 13.9 10*3/uL — ABNORMAL HIGH (ref 4.0–10.5)
nRBC: 0 % (ref 0.0–0.2)

## 2023-04-22 LAB — COMPREHENSIVE METABOLIC PANEL
ALT: 23 U/L (ref 0–44)
AST: 16 U/L (ref 15–41)
Albumin: 2.7 g/dL — ABNORMAL LOW (ref 3.5–5.0)
Alkaline Phosphatase: 163 U/L — ABNORMAL HIGH (ref 38–126)
Anion gap: 12 (ref 5–15)
BUN: 17 mg/dL (ref 8–23)
CO2: 18 mmol/L — ABNORMAL LOW (ref 22–32)
Calcium: 8.3 mg/dL — ABNORMAL LOW (ref 8.9–10.3)
Chloride: 101 mmol/L (ref 98–111)
Creatinine, Ser: 0.52 mg/dL (ref 0.44–1.00)
GFR, Estimated: >60 mL/min (ref >60)
Glucose, Bld: 157 mg/dL — ABNORMAL HIGH (ref 70–99)
Potassium: 4.7 mmol/L (ref 3.5–5.1)
Sodium: 131 mmol/L — ABNORMAL LOW (ref 135–145)
Total Bilirubin: 0.5 mg/dL (ref 0.0–1.2)
Total Protein: 5.9 g/dL — ABNORMAL LOW (ref 6.5–8.1)

## 2023-04-22 LAB — T4, FREE: Free T4: 1.13 ng/dL — ABNORMAL HIGH (ref 0.61–1.12)

## 2023-04-22 LAB — GLUCOSE, CAPILLARY
Glucose-Capillary: 170 mg/dL — ABNORMAL HIGH (ref 70–99)
Glucose-Capillary: 153 mg/dL — ABNORMAL HIGH (ref 70–99)
Glucose-Capillary: 149 mg/dL — ABNORMAL HIGH (ref 70–99)

## 2023-04-22 LAB — TSH: TSH: 1.013 u[IU]/mL (ref 0.350–4.500)

## 2023-04-22 LAB — CORTISOL-AM, BLOOD: Cortisol - AM: 18.3 ug/dL (ref 6.7–22.6)

## 2023-04-22 MED ORDER — VITAMIN B-12 1000 MCG PO TABS
1000.0000 ug | ORAL_TABLET | Freq: Every day | ORAL | Status: DC
  Administered 2023-04-23 – 2023-04-27 (×5): 1000 ug
  Filled 2023-04-22 (×5): qty 1

## 2023-04-22 MED ORDER — PRAVASTATIN SODIUM 40 MG PO TABS
40.0000 mg | ORAL_TABLET | Freq: Every day | ORAL | Status: DC
  Administered 2023-04-22 – 2023-04-26 (×5): 40 mg
  Filled 2023-04-22 (×5): qty 1

## 2023-04-22 MED ORDER — FAMOTIDINE 20 MG PO TABS
20.0000 mg | ORAL_TABLET | Freq: Every day | ORAL | Status: DC
  Administered 2023-04-22 – 2023-04-27 (×6): 20 mg
  Filled 2023-04-22 (×6): qty 1

## 2023-04-22 MED ORDER — DIPHENHYDRAMINE HCL 25 MG PO CAPS: 25.0000 mg | ORAL_CAPSULE | Freq: Four times a day (QID) | ORAL | Status: DC | PRN

## 2023-04-22 MED ORDER — ADULT MULTIVITAMIN W/MINERALS CH
1.0000 | ORAL_TABLET | Freq: Every day | ORAL | Status: DC
  Administered 2023-04-23 – 2023-04-27 (×5): 1
  Filled 2023-04-22 (×5): qty 1

## 2023-04-22 MED ORDER — OMEPRAZOLE 2 MG/ML ORAL SUSPENSION: 20.0000 mg | Freq: Two times a day (BID) | ORAL | Status: DC

## 2023-04-22 MED ORDER — ALUM & MAG HYDROXIDE-SIMETH 200-200-20 MG/5ML PO SUSP: 30.0000 mL | ORAL | Status: DC | PRN

## 2023-04-22 MED ORDER — ACETAMINOPHEN 325 MG PO TABS
325.0000 mg | ORAL_TABLET | ORAL | Status: DC | PRN
  Administered 2023-04-22: 325 mg

## 2023-04-22 MED ORDER — OYSTER SHELL CALCIUM/D3 500-5 MG-MCG PO TABS
1.0000 | ORAL_TABLET | Freq: Three times a day (TID) | ORAL | Status: DC
  Administered 2023-04-22 – 2023-04-27 (×15): 1
  Filled 2023-04-22 (×17): qty 1

## 2023-04-22 MED ORDER — PIPERACILLIN-TAZOBACTAM 3.375 G IVPB
3.3750 g | Freq: Three times a day (TID) | INTRAVENOUS | Status: DC
  Administered 2023-04-22 – 2023-04-26 (×13): 3.375 g via INTRAVENOUS
  Filled 2023-04-22 (×15): qty 50

## 2023-04-22 MED ORDER — LORATADINE 10 MG PO TABS
10.0000 mg | ORAL_TABLET | Freq: Every day | ORAL | Status: DC | PRN
  Administered 2023-04-25: 10 mg
  Filled 2023-04-22: qty 1

## 2023-04-22 MED ORDER — PROCHLORPERAZINE MALEATE 5 MG PO TABS: 5.0000 mg | ORAL_TABLET | Freq: Four times a day (QID) | ORAL | Status: DC | PRN

## 2023-04-22 MED ORDER — ACETAMINOPHEN 500 MG PO TABS
1000.0000 mg | ORAL_TABLET | Freq: Three times a day (TID) | ORAL | Status: DC
  Administered 2023-04-22 – 2023-04-27 (×15): 1000 mg
  Filled 2023-04-22 (×15): qty 2

## 2023-04-22 MED ORDER — LOSARTAN POTASSIUM 50 MG PO TABS
50.0000 mg | ORAL_TABLET | Freq: Every day | ORAL | Status: DC
  Administered 2023-04-23 – 2023-04-27 (×5): 50 mg
  Filled 2023-04-22 (×5): qty 1

## 2023-04-22 MED ORDER — METOPROLOL TARTRATE 12.5 MG HALF TABLET
25.0000 mg | ORAL_TABLET | Freq: Two times a day (BID) | ORAL | Status: DC
  Administered 2023-04-22 – 2023-04-27 (×10): 25 mg
  Filled 2023-04-22 (×10): qty 2

## 2023-04-22 MED ORDER — METHOCARBAMOL 500 MG PO TABS
500.0000 mg | ORAL_TABLET | Freq: Three times a day (TID) | ORAL | Status: DC | PRN
  Administered 2023-04-25 (×2): 500 mg
  Filled 2023-04-22 (×2): qty 1

## 2023-04-22 MED ORDER — GABAPENTIN 300 MG PO CAPS
300.0000 mg | ORAL_CAPSULE | Freq: Every day | ORAL | Status: DC
  Administered 2023-04-22 – 2023-04-25 (×4): 300 mg via ORAL
  Filled 2023-04-22: qty 3
  Filled 2023-04-22 (×3): qty 1

## 2023-04-22 MED ORDER — ASPIRIN 81 MG PO CHEW
81.0000 mg | CHEWABLE_TABLET | Freq: Every day | ORAL | Status: DC
  Administered 2023-04-23 – 2023-04-27 (×5): 81 mg
  Filled 2023-04-22 (×5): qty 1
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
## 2023-04-22 NOTE — Progress Notes (Signed)
 Occupational Therapy Session Note  Patient Details  Name: Rita Taylor MRN: [id number] Date of Birth: 12-21-1940  Today's Date: 04/22/2023 OT Individual Time: 1117-1204 OT Individual Time Calculation (min): 47 min    Short Term Goals: Week 1:  OT Short Term Goal 1 (Week 1): Pt will complete toileting with Max A with use of DME as needed. OT Short Term Goal 2 (Week 1): Pt will complete LB dressing and bathing with Max A OT Short Term Goal 3 (Week 1): Patient will complete UB bathing and dressing with Max A OT Short Term Goal 4 (Week 1): Pt will complete oral care with Max A.  Skilled Therapeutic Interventions/Progress Updates:    Patient received sitting up in recliner with tube feeding running.  Patient opened eyes to her name being called, and answered "good" when asked how she was.  Patient transferred to tilt in space chair with max assist - patient with strong backward bias and small shuffling steps.  Patient moans and grips at surfaces during transfer.  Patient indicates it is not painful to move - but that she is afraid to fall.  Patient taken to sink to wash and change brief.  Patient assisted to stand at sink, and maintained standing with solid surface in front of her x 30+ seconds while assisted to clean her and place clean brief.  Patient moans with all mobility tasks.  Patient washed her face with hand over hand assistance to start.  Patient's mouth cleaned with total assist.  Patient assisted back to recliner chair - stand step transfer with max assist.  Safety belt in place and engaged and call bell in reach.    Therapy Documentation Precautions:  Precautions Precautions: Cervical, Fall Precaution Booklet Issued: No Recall of Precautions/Restrictions: Impaired Precaution/Restrictions Comments: cervical hard collar OOB and with activity, okay to be off to sleep and eat Required Braces or Orthoses: Cervical Brace Cervical Brace: Hard collar Restrictions Weight Bearing  Restrictions Per Provider Order: No   Pain:  Denies pain - no obvious signs or symptoms of pain     Therapy/Group: Individual Therapy  Collier Salina 04/22/2023, 12:41 PM

## 2023-04-22 NOTE — Plan of Care (Signed)
 Consider changing supervision goals to more patient appropriate goals. Care coordinator notified    Problem: Consults Goal: RH GENERAL PATIENT EDUCATION Description: See Patient Education module for education specifics. 04/22/2023 1832 by Mylo Red, LPN Outcome: Not Progressing 04/22/2023 1831 by Mylo Red, LPN Outcome: Not Progressing   Problem: RH BOWEL ELIMINATION Goal: RH STG MANAGE BOWEL WITH ASSISTANCE Description: STG Manage Bowel with supervision  Assistance. 04/22/2023 1832 by Mylo Red, LPN Outcome: Not Progressing 04/22/2023 1831 by Mylo Red, LPN Outcome: Not Progressing   Problem: RH BLADDER ELIMINATION Goal: RH STG MANAGE BLADDER WITH ASSISTANCE Description: STG Manage Bladder With  supervision assistance Assistance 04/22/2023 1832 by Mylo Red, LPN Outcome: Not Progressing 04/22/2023 1831 by Mylo Red, LPN Outcome: Not Progressing   Problem: RH SKIN INTEGRITY Goal: RH STG SKIN FREE OF INFECTION/BREAKDOWN Description: Manage skin free of infection with supervision 04/22/2023 1832 by Mylo Red, LPN Outcome: Not Progressing 04/22/2023 1831 by Mylo Red, LPN Outcome: Not Progressing   Problem: RH KNOWLEDGE DEFICIT GENERAL Goal: RH STG INCREASE KNOWLEDGE OF SELF CARE AFTER HOSPITALIZATION Description: Manage knowledge increase knowledge of self care after hospitalization with supervision using educational materials provided. 04/22/2023 1832 by Mylo Red, LPN Outcome: Not Applicable--patient has not discharged  04/22/2023 1831 by Mylo Red, LPN Outcome: Not Progressing   Problem: RH BOWEL ELIMINATION Goal: RH STG MANAGE BOWEL W/MEDICATION W/ASSISTANCE Description: STG Manage Bowel with Medication with Assistance. 04/22/2023 1832 by Mylo Red, LPN Outcome: Progressing 04/22/2023 1831 by Mylo Red, LPN Outcome: Not Progressing   Problem: RH SAFETY Goal: RH STG ADHERE TO SAFETY  PRECAUTIONS W/ASSISTANCE/DEVICE Description: STG Adhere to Safety Precautions With Assistance/Device. 04/22/2023 1832 by Mylo Red, LPN Outcome: Progressing 04/22/2023 1831 by Mylo Red, LPN Outcome: Not Progressing   Problem: RH PAIN MANAGEMENT Goal: RH STG PAIN MANAGED AT OR BELOW PT'S PAIN GOAL Description: <4 w/ prns 04/22/2023 1832 by Mylo Red, LPN Outcome: Progressing 04/22/2023 1831 by Mylo Red, LPN Outcome: Not Progressing

## 2023-04-22 NOTE — Progress Notes (Addendum)
 PROGRESS NOTE   Subjective/Complaints: Patient seen laying in bed this a.m., arousable to verbal stimuli and oriented to self and hospital, but otherwise needs increased time, cueing, and is more confused than prior days.  No new obvious neurologic deficits.  Chest x-ray overnight with bibasilar opacities suspicious for pneumonia versus aspiration pneumonitis. Vitally, mildly hypertensive since adjusting medications, but other vitals look okay.  No fevers.  P.o. intakes remain minimal  ROS: Denies fevers, chills, N/V, abdominal pain, constipation, diarrhea, SOB, cough, chest pain, new weakness or paraesthesias.   + Confusion--worsening + incontinence + Loss of appetite  Objective:   DG CHEST PORT 1 VIEW Result Date: 04/21/2023 CLINICAL DATA:  Cough today. EXAM: PORTABLE CHEST 1 VIEW COMPARISON:  03/30/2023 FINDINGS: Normal sized heart. Interval mild patchy densities in at both lung bases. Tortuous and partially calcified thoracic aorta. Stable old, healed right rib fractures. Interval right VP shunt. Interval feeding tube with its tip not included. Interval cervicothoracic fixation hardware. Stable left breast prosthesis capsular calcifications. IMPRESSION: Interval mild bibasilar patchy densities suspicious for pneumonia or aspiration pneumonitis. Electronically Signed   By: Beckie Salts M.D.   On: 04/21/2023 17:53   No results for input(s): "WBC", "HGB", "HCT", "PLT" in the last 72 hours.  Recent Labs    04/21/23 0551  NA 129*  K 4.7  CL 102  CO2 24  GLUCOSE 163*  BUN 22  CREATININE 0.55  CALCIUM 7.2*    Intake/Output Summary (Last 24 hours) at 04/22/2023 0926 Last data filed at 04/22/2023 0826 Gross per 24 hour  Intake 0 ml  Output 200 ml  Net -200 ml        Physical Exam: Vital Signs Blood pressure (!) 145/56, pulse 93, temperature 98.4 F (36.9 C), resp. rate 15, height 5\' 2"  (1.575 m), weight 44.5 kg, SpO2  100%.   General: No apparent distress, Cachectic.  Reclining in bed. HEENT: + staples over shunt site, oral mucosa dry  [place] J, + NGT Left eye tape/gauze, --no more erythema or injection, no discharge today  Neck: Supple without JVD or lymphadenopathy Heart: Reg rate and rhythm. Chest: Mild bilateral lower lobe rhonchi, some central, wet sounding cough.  No apparent distress. Abdomen: Soft, non-tender, non-distended, bowel sounds positive.  Extremities: No clubbing, cyanosis, or edema.  Psych: Difficult to determine, lethargic Skin:  bruises noted Anterior and posterior surgical incisions CDI Staples over cranial incisions  intact Neuro:     Mental Status: AAO to person, place with cues, not time + Poor memory and concentration, poor carryover--worse today + L neglect - ?  Worse today Speech/Languate: follows simple commands, minimal verbalizations today, delayed responses,  CRANIAL NERVES: EOMI R eye, decreased hearing L, L facial weakness-chronic     MOTOR: Moving all 4 extremities in bed, antigravity and against resistance. No apparent ataxia in upper or lower extremities SENSORY: Normal to touch all 4 extremities   Assessment/Plan: 1. Functional deficits which require 3+ hours per day of interdisciplinary therapy in a comprehensive inpatient rehab setting. Physiatrist is providing close team supervision and 24 hour management of active medical problems listed below. Physiatrist and rehab team continue to assess barriers to discharge/monitor patient progress  toward functional and medical goals  Care Tool:  Bathing    Body parts bathed by patient: Face, Abdomen   Body parts bathed by helper: Right arm, Left arm, Chest, Front perineal area, Buttocks, Right upper leg, Left upper leg, Right lower leg, Left lower leg     Bathing assist Assist Level: Total Assistance - Patient < 25%     Upper Body Dressing/Undressing Upper body dressing   What is the patient wearing?:  Pull over shirt    Upper body assist Assist Level: Total Assistance - Patient < 25%    Lower Body Dressing/Undressing Lower body dressing      What is the patient wearing?: Incontinence brief, Pants     Lower body assist Assist for lower body dressing: Total Assistance - Patient < 25%     Toileting Toileting    Toileting assist Assist for toileting: Dependent - Patient 0%     Transfers Chair/bed transfer  Transfers assist  Chair/bed transfer activity did not occur: Safety/medical concerns (unsafe to get up)  Chair/bed transfer assist level: Moderate Assistance - Patient 50 - 74%     Locomotion Ambulation   Ambulation assist   Ambulation activity did not occur: Safety/medical concerns          Walk 10 feet activity   Assist  Walk 10 feet activity did not occur: Safety/medical concerns        Walk 50 feet activity   Assist Walk 50 feet with 2 turns activity did not occur: Safety/medical concerns         Walk 150 feet activity   Assist Walk 150 feet activity did not occur: Safety/medical concerns         Walk 10 feet on uneven surface  activity   Assist Walk 10 feet on uneven surfaces activity did not occur: Safety/medical concerns         Wheelchair     Assist Is the patient using a wheelchair?: Yes Type of Wheelchair: Manual    Wheelchair assist level: Dependent - Patient 0%      Wheelchair 50 feet with 2 turns activity    Assist        Assist Level: Dependent - Patient 0%   Wheelchair 150 feet activity     Assist      Assist Level: Dependent - Patient 0%   Blood pressure (!) 145/56, pulse 93, temperature 98.4 F (36.9 C), resp. rate 15, height 5\' 2"  (1.575 m), weight 44.5 kg, SpO2 100%.  Medical Problem List and Plan: 1. 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  Antithrombotics: -DVT/anticoagulation:  Pharmaceutical: Lovenox added             -antiplatelet therapy: ASA to start today.   3. Pain Management:  tylenol prn  -3/5: DC gabapentin, oxycodone due to no complaints and ongoing lethargy/delirium  3-7: Patient denying pain, but holding her head and bring her legs up to her chest. ?  Headache, ?  Chronic severe low back pain contributing to behaviors (on chart review, has been severe up until now).  Resume gabapentin 300 mg nightly, scheduled  Tylenol 1000 mg 3 times daily.  4. Mood/Behavior/Sleep:  LCSW to follow for evaluation and support.              -antipsychotic agents: N/A             -Continue Wellbutrin   - 3/7: Sleeping intermittently 7 to 8 hours per log, interrupted frequently.  See above.  5. Neuropsych/cognition: This patient is not fully capable of making decisions on her own behalf.  -3-6: Cognition and attention remains poor, unsure if delirium, and TBI, or other contributing cause.  With worsening hyponatremia, will undergo endocrinologic workup as below.  Will consider stimulant if no improvement with resolution of hyponatremia.  3/7: Gabapentin 300 mg nightly for sleep and pain, scheduled Tylenol as above for pain, treat aspiration pneumonia as below.  If no improvement over the weekend, will initiate neurostimulant on Monday.   --Addendum: Provided medical update to patient's son, requesting CT head for shunt evaluation given concern for patient headaches that she is not reporting and worsening cognition.  Feel this is a reasonable request, ordered routine CT head without contrast.  6. Skin/Wound Care: Routine pressure   -Staple removal on 3/11-14  7. Fluids/Electrolytes/Nutrition: Recheck CMET in am.             -See below  8. C6 Fx with instability/T3 burst Fx: Treated wit  ORIF and C4-T4 arthrodesis.              --C  collar when out of bed.   -3-7: Patient tends to keep neck extended in bed, mouth open, which is contributing to throat dryness and worsening swallowing; will keep collar on in bed for now  -Attempted to obtain better fitting collar, no small available in [place], pediatric did not fit.  9. HTN/Tachycardia: Will monitor BP/HR TID--continue Cozaar and lopressor.             --monitor for orthostatic changes.   -Normotensive  -3-6: Reduce Cozaar to 50 mg daily due to hyponatremia.  Labs in AM.  -3-7: Blood pressure slightly elevated, okay given age and hydrocephalus, monitor for now     04/22/2023    2:23 PM 04/22/2023    6:30 AM 04/22/2023    4:32 AM  Vitals with BMI  Weight  98 lbs 2 oz 93 lbs 4 oz  BMI  17.94 17.05  Systolic 148  145  Diastolic 66  56  Pulse 88  93     10. Leucocytosis: Resolving--monitor for fevers or others signs of infection. WBC 13.7 3/3    -Stable 3-4  11. H/o Depression: Change Wellbutrin XL to 75 mg bid as cannot be crushed.  -3-5: May benefit from addition of mirtazapine for appetite and depression; will see first if increase Megace helps.  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 Dysphagia: On D2 diet with thin liquids via medicine cup--question provale cup?             --Will add full supervision and assist with meals. Has to be upright for meals. -3-7: Make n.p.o. due to aspiration pneumonia; swallow study done by SLP today, results pending.  Would keep n.p.o. for now--p.o. intakes have been minimal anyway.  13. Delirium: Will continue delirium precautions. Start sleep chart   - No sleep log overnight, labs today  -Sleeping overnight per reports.  -3-5: Limit deliriogenic medications, DC gabapentin, oxycodone.  -3-6: Ongoing; will get cortisol,  TSH, free T4, CMP, and CBC in a.m. Urinalysis today negative.  Will get chest x-ray given cough.  -3/7: AM cortisol,  thyroid studies normal; sodium improving.  WBC remains elevated, chest x-ray with aspiration pneumonia, treating as below.  14. H/o 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with recurrence: Size stable per NS.  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 NA 132 on 3/3             --recheck Na level in am.--Stable 132.   3-5: Recheck sodium in AM.  3-6: Labs consistent with SIADH; Na 129.  is getting only 800 cc/day of water flushes through tube, no additional p.o. intakes.  Unable to adjust to saline flushes, so we will add salt tabs 1 mg twice daily through the tube, reduce losartan, and undergo endocrinologic workup with a.m. cortisol and thyroid studies.  3/7: Sodium improved to 131 today, continue salt tabs and treat pneumonia as below.  Daily labs over this weekend.  16. ABLA: Will monitor with serial checks.  HGB 9.6 3/3   - Stable 17. Pre-diabetes: Hgb A1C- 6.0 a year ago. Will recheck in am. Monitor fasting BS for now.    -3-4: Add SSI overnight during tube feeds.--Well-controlled. Recent Labs    04/21/23 1109 04/21/23 1630 04/22/23 0605  GLUCAP 127* 113* 170*     18. Severe malnutrition  --continue tube feeds at nights. Decrease to 65 cc/10 hours at nights             --per friends patient did not eat much at baseline. Now on D2 diet with fatigue             --will ask nursing to save food slips. Continue Megace.   -3-3: Dietary initiating calorie count.  - 3/5: Increase megace to BID.  If no improvement, will need to discuss possible PEG tube.  -3-7: DC Megace, has not improved appetite at twice daily scheduling and could be affecting patient's cognition.  19..  Aspiration pneumonia  - seen on CXR 3/6  3/7: Discussed antibiotics with pharmacy, adequate for monotherapy with Zosyn 3.75 mg every 8 hours  -N.p.o., head of bed elevated greater than 30 degrees for continuous tube feeds  - Omeprazole 20 mg BID via tube to prevent TF reflux - Daily labs ordered over this  weekend  LOS: 4 days A FACE TO FACE EVALUATION WAS PERFORMED  Angelina Sheriff 04/22/2023, 9:26 AM

## 2023-04-22 NOTE — Progress Notes (Signed)
 Results of sleep chart is 9 hours. Patient care was included within the hours that aren't a full hour of sleep
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
## 2023-04-22 NOTE — Progress Notes (Signed)
 Physical Therapy Session Note  Patient Details  Name: Rita Taylor MRN: [id number] Date of Birth: 02-06-1941  Today's Date: 04/22/2023 PT Individual Time: 1005-1045 + 4098-1191 PT Individual Time Calculation (min): 40 min + 70 min  Short Term Goals: Week 1:  PT Short Term Goal 1 (Week 1): Pt will complete bed mobility with min assist PT Short Term Goal 2 (Week 1): Pt will complete transfers with mod assist and LRAD PT Short Term Goal 3 (Week 1): Pt will ambulate 24' with RW mod assist  Skilled Therapeutic Interventions/Progress Updates:    SESSION 1: Pt presents in room in bed with MD present, minimally verbally responsive however pt is awake and alert. Pt able to minimally respond to verbal commands. Session focused on therapeutic activities for participating in self care tasks, bed mobility and trasnfer training as well as tolerance to upright.   Therapist dons pt's pants in supine with pt able to respond to verbal cues minimally with pt able to move through partial ROM when cued for lifting BLEs for donning pants. Pt requires max assist to sit EOB with max verbal cues for attendance to task. Cervical collar donned with pt seated EOB total assist. Pt completes sit to stand with max assist, therapist provides max verbal cues for pt to reach to pants to assist with managing over hips however pt unable to attend to task and demonstrating increased muscle guarding and fear of falling while in standing. Pt continues to demonstrate retropulsion in standing. Pt completes stand pivot transfer with total assist as pt unable to complete steps to transfer. Pt comes to sitting in WC, requires extended seated rest break as pt falling asleep following transfer.  Therapist attempts to arouse alertness and responsiveness by asking pt orientation questions, pt able to state full name, also able to provide the name of her husband but becomes minimally responsive to other questions.  Pt then completes sit to  stand to RW, requires assist x2 to ambulate to bed for maintaining upright posture secondary to retropulsion and for RW mgmt. Pt positioned in recliner with pillows to promote upright posture and comfort. Pt remains seated in recliner with all needs within reach, call light in place, and chair alarm donned and activated at end of sessoin.   SESSION 2: Pt presents in room in recliner, continues to be minimally verbally responsive however pt husband calls at start of session and pt demonstrates improved alertness to speak to husband on phone. Much of session focused on therapeutic activities and cues to increase alertness as well as adjusting new TIS WC to improve positioning and tolerance to upright for improved trunk and cervical support and gait training. Pt does require increased time for all mobility due to lethargy, decreased alertness, and poor attention to task. MD present during session with new pediatric orthosis for improving cervical positioning and tolerance however miami j c-collar noted to provide best fit. Pt friend present during session and pt does demonstrate improved alertness with familiar people and voices. Pt completes stand pivot transfers max to total assist throughout session. Pt requires heavy max assist for sit to stands during session, requires multiple attempts due to decreased alertness and lethargy and max verbal cues for attention to task for all mobility. Pt requires assist x2 for gait training 16' with RW with pt demonstrating increasing posterior lean and short shuffling gait, pt comes to sitting on EOM requires mod/max assist for seated balance as pt attempting to flex L hip/knee onto mat. Pt  returned to room at end of session in [facility], completes total assist transfer WC to bed, requires mod assist for seated balance, then requires total assist for sit to supine despite max cues for sequencing bed mobility. Pt remains supine with all needs within reach, call light in place, and  bed alarm activated at end of session. Pt demonstrates painful behaviors and requires increased time and significant cueing to express that she has pain, RN notified at end of session.   Therapy Documentation Precautions:  Precautions Precautions: Cervical, Fall Precaution Booklet Issued: No Recall of Precautions/Restrictions: Impaired Precaution/Restrictions Comments: cervical hard collar OOB and with activity, okay to be off to sleep and eat Required Braces or Orthoses: Cervical Brace Cervical Brace: Hard collar Restrictions Weight Bearing Restrictions Per Provider Order: No   Therapy/Group: Individual Therapy  Edwin Cap PT, DPT 04/22/2023, 10:48 AM

## 2023-04-23 DIAGNOSIS — R739 Hyperglycemia, unspecified: Secondary | ICD-10-CM

## 2023-04-23 LAB — CBC WITH DIFFERENTIAL/PLATELET
Abs Immature Granulocytes: 0.05 10*3/uL (ref 0.00–0.07)
Basophils Absolute: 0.1 10*3/uL (ref 0.0–0.1)
Basophils Relative: 1 %
Eosinophils Absolute: 0.1 10*3/uL (ref 0.0–0.5)
Eosinophils Relative: 1 %
HCT: 28.8 % — ABNORMAL LOW (ref 36.0–46.0)
Hemoglobin: 9.3 g/dL — ABNORMAL LOW (ref 12.0–15.0)
Immature Granulocytes: 1 %
Lymphocytes Relative: 19 %
Lymphs Abs: 2.1 10*3/uL (ref 0.7–4.0)
MCH: 29 pg (ref 26.0–34.0)
MCHC: 32.3 g/dL (ref 30.0–36.0)
MCV: 89.7 fL (ref 80.0–100.0)
Monocytes Absolute: 1 10*3/uL (ref 0.1–1.0)
Monocytes Relative: 9 %
Neutro Abs: 7.8 10*3/uL — ABNORMAL HIGH (ref 1.7–7.7)
Neutrophils Relative %: 69 %
Platelets: 496 10*3/uL — ABNORMAL HIGH (ref 150–400)
RBC: 3.21 MIL/uL — ABNORMAL LOW (ref 3.87–5.11)
RDW: 14.3 % (ref 11.5–15.5)
WBC: 11 10*3/uL — ABNORMAL HIGH (ref 4.0–10.5)
nRBC: 0 % (ref 0.0–0.2)

## 2023-04-23 LAB — BASIC METABOLIC PANEL
Anion gap: 7 (ref 5–15)
BUN: 15 mg/dL (ref 8–23)
CO2: 21 mmol/L — ABNORMAL LOW (ref 22–32)
Calcium: 8 mg/dL — ABNORMAL LOW (ref 8.9–10.3)
Chloride: 105 mmol/L (ref 98–111)
Creatinine, Ser: 0.66 mg/dL (ref 0.44–1.00)
GFR, Estimated: >60 mL/min (ref >60)
Glucose, Bld: 129 mg/dL — ABNORMAL HIGH (ref 70–99)
Potassium: 4.5 mmol/L (ref 3.5–5.1)
Sodium: 133 mmol/L — ABNORMAL LOW (ref 135–145)

## 2023-04-23 LAB — GLUCOSE, CAPILLARY
Glucose-Capillary: 143 mg/dL — ABNORMAL HIGH (ref 70–99)
Glucose-Capillary: 124 mg/dL — ABNORMAL HIGH (ref 70–99)
Glucose-Capillary: 151 mg/dL — ABNORMAL HIGH (ref 70–99)
Glucose-Capillary: 120 mg/dL — ABNORMAL HIGH (ref 70–99)

## 2023-04-23 NOTE — Progress Notes (Signed)
 Speech Language Pathology Daily Session Note  Patient Details  Name: Rita Taylor MRN: [id number] Date of Birth: 08-Jan-1941  Today's Date: 04/23/2023 SLP Individual Time: 6010-9323 SLP Individual Time Calculation (min): 30 min  Short Term Goals: Week 1: SLP Short Term Goal 1 (Week 1): Pt will demonstrate improved mastication during trials of D3 diet textures to determine the potential for diet texture upgrade. SLP Short Term Goal 2 (Week 1): Pt will complete functional problem solving tasks with max A when provided visual and/or verbal cues. SLP Short Term Goal 3 (Week 1): Pt will recall functional information in the current environment using compensatory memory strategies when provided max A. SLP Short Term Goal 4 (Week 1): Patient will sustain attention to task for 10 minutes given max multimodal A.  Skilled Therapeutic Interventions: 1100-1108: Upon entrance patient asleep in fetal position. SLP awoken patient via tactile/verbal stim and began speaking with patient. Patient immediately falling asleep with limited-no vebalizations after each question. Patient required tactile cues to arouse again after approx 7-10 seconds. SLP will attempt to return per patient/therapist schedule.   5573-2202: SLP returned to patients room in attempt to make up missed minutes from this morning. Patient awake/alert surrounded by family/friends. SLP introduced self and patients visitors left room. SLP provided oral care and offered ice chips per free water protocol. Patient with multiple swallows per bolus and immediate coughing/throat clears. PO d/c due to consistent s/sx of aspiration. Recommend continuation of NPO. SLP targeted cognitive goals through orientation and problem solving task. Patient verbalized year, however with no gestures/verbal responses to remainder of orientation questions despite maxA. Patient unable to recall use for call bell, however pointed to red button when asked how to call nurse.  Patient began to fatigue and required maxA for attention to tasks for over 10 seconds. Session d/c due to fatigue. Patient left in bed with alarm set and call bell in reach. Continue POC.    30 minutes of ST missed this date due to fatigue.     Pain None verbalized  Therapy/Group: Individual Therapy  Ronnae Kaser M.A., CF-SLP 04/23/2023, 7:37 AM

## 2023-04-23 NOTE — Progress Notes (Signed)
 Occupational Therapy Session Note  Patient Details  Name: Rita Taylor MRN: [id number] Date of Birth: 04/19/1940  Today's Date: 04/23/2023 OT Individual Time: 1303-1400 OT Individual Time Calculation (min): 57 min    Short Term Goals: Week 1:  OT Short Term Goal 1 (Week 1): Pt will complete toileting with Max A with use of DME as needed. OT Short Term Goal 2 (Week 1): Pt will complete LB dressing and bathing with Max A OT Short Term Goal 3 (Week 1): Patient will complete UB bathing and dressing with Max A OT Short Term Goal 4 (Week 1): Pt will complete oral care with Max A.  Skilled Therapeutic Interventions/Progress Updates:    Pt received in bed with R eye closed but was able to open it upon me introducing myself.  To establish rapport with pt, brought her flowers from dresser to her bed to look at more closely.  Pt smiled.  Told pt I was going to have her sit to EOB and then get to her w/c and reassured her she was safe and the movements would be slow.   Pt already had her c collar on.  Max A to sit EOB and complete squat pivot to w/c including scooting back in TIS w/c as seat was elevated.  Pt tends to push back so had her work from w/c on forward weight shifting.  Due to short attention span, had her do 3 repetitions at a time of a forward reach activity with tactile guiding from my arms.  Pt not moaning or crying and did not seem to be fearful today, but no verbalizations at all except 1 time after asking her if anything on her body is hurting she said "no, I think everything is ok" in a fluid clear sentence.  No further verbalizations after that, even with trying to engage pt with her childrens' names.   Moved her w/c closer to window to have her work on leaning forward to look out window but then she kept sinking back into the chair to close her eye, so moved her to a position to be more active.  Using elevated bed rails for hand support, had pt work on sit to stands and trying to hold  the stand to a count of 10.  She would rise up with mod A but then only only stand for 3-4 counts before plopping down.  Mod A transfer w/c to bed as pt able to use bed rail, but then +2 to get adjusted in supine with HOB elevated.  Doffed c collar per order.  Positioned pt with pillows. Pt resting in bed with all needs met. Alarm set and call light in reach.    Therapy Documentation Precautions:  Precautions Precautions: Cervical, Fall Precaution Booklet Issued: No Recall of Precautions/Restrictions: Impaired Precaution/Restrictions Comments: cervical hard collar OOB and with activity, okay to be off to sleep and eat Required Braces or Orthoses: Cervical Brace Cervical Brace: Hard collar Restrictions Weight Bearing Restrictions Per Provider Order: No  Vital Signs: Therapy Vitals Temp: 98 F (36.7 C) Pulse Rate: 90 Resp: 16 BP: 115/62 Patient Position (if appropriate): Sitting Oxygen Therapy SpO2: 98 % O2 Device: Room Air Pain: Pain Assessment Pain Scale: 0-10 Pain Score: 0-No pain Faces Pain Scale: No hurt Pain Intervention(s): Other (Comment) ADL: ADL Eating: Dependent Where Assessed-Eating: Bed level Grooming: Dependent Where Assessed-Grooming: Sitting at sink Upper Body Bathing: Dependent Where Assessed-Upper Body Bathing: Sitting at sink Lower Body Bathing: Dependent Where Assessed-Lower Body Bathing: Sitting  at sink Upper Body Dressing: Dependent Where Assessed-Upper Body Dressing: Sitting at sink, Wheelchair Lower Body Dressing: Dependent Where Assessed-Lower Body Dressing: Sitting at sink, Wheelchair Toileting: Dependent Where Assessed-Toileting: Toilet, Bedside Commode Toilet Transfer: Maximal assistance Toilet Transfer Method: Stand pivot Tub/Shower Transfer: Unable to assess [employer] Transfer: Unable to assess   Therapy/Group: Individual Therapy  Rita Taylor 04/23/2023, 4:14 PM

## 2023-04-23 NOTE — Progress Notes (Signed)
 Physical Therapy Session Note  Patient Details  Name: Rita Taylor MRN: [id number] Date of Birth: 1940/05/14  Today's Date: 04/23/2023 PT Individual Time: 0910-0956 PT Individual Time Calculation (min): 46 min  Today's Date: 04/23/2023 PT Missed Time: 29 Minutes Missed Time Reason: Patient fatigue  Short Term Goals: Week 1:  PT Short Term Goal 1 (Week 1): Pt will complete bed mobility with min assist PT Short Term Goal 2 (Week 1): Pt will complete transfers with mod assist and LRAD PT Short Term Goal 3 (Week 1): Pt will ambulate 33' with RW mod assist  Skilled Therapeutic Interventions/Progress Updates: Patient supine in room asleep (easily awakened by verbal name call) on entrance to room. Patient alert and agreeable to PT session.   Patient reported no pain during session. Pt reported receiving adequate sleep the night before with no other complaints. Pt reported fatigue during session and required max effort to stay aroused. Pt transported back to room in TIS to rest with noted time missed due to fatigue. Pt also reported soiled brief at end of session with nsg notified.   Therapeutic Activity: Bed Mobility: Pt performed supine<>sit on EOB with maxA and VC to roll to L side (HOB elevated due to tube feeding via NG). Added cues for pt to advance B LE's off bed at beginning of session (pt with CGA/close supervision sitting balance for short period at beginning of session), and on bed at end with added cue to lean towards L side (pt required totalA to scoot to [facility] + 2 via chuck at end of session).  Transfers: Pt performed sit<>stand pivot transfers throughout session with maxA. Pt taking small steps to pivot with VC to step back to TIS.  Therapeutic Exercise: Pt performed the following exercises with therapist providing the described cuing and facilitation for improvement. VC throughout for pt to maintain attention to task, to control eccentric and to perform therex one LE at a time vs  together (tactile cuing and added target cue for pt to reach PTA hand for increased hip flexion/LAQ AROM) - B LAQ 2 x 10 with 4lb ankle weight donned - B hip flexion seated (required max cuing to move B LE's, and maxA to bring closer to full ROM on a few reps - pt less than 25% AROM) - Static standing balance from TIS with VC for pt to push off of arm rest with R UE. Pt with forward flexed posture with cues to extend through hips and to shift weight forward due to posterior lean. Pt required seated rest break shortly after standing due to fatigue.  Patient supine in bed at end of session with brakes locked, nsg present, bed alarm set, and all needs within reach.      Therapy Documentation Precautions:  Precautions Precautions: Cervical, Fall Precaution Booklet Issued: No Recall of Precautions/Restrictions: Impaired Precaution/Restrictions Comments: cervical hard collar OOB and with activity, okay to be off to sleep and eat Required Braces or Orthoses: Cervical Brace Cervical Brace: Hard collar Restrictions Weight Bearing Restrictions Per Provider Order: No  Therapy/Group: Individual Therapy  Gerell Fortson PTA 04/23/2023, 10:02 AM
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
## 2023-04-24 LAB — BASIC METABOLIC PANEL
Anion gap: 6 (ref 5–15)
BUN: 18 mg/dL (ref 8–23)
CO2: 23 mmol/L (ref 22–32)
Calcium: 7.9 mg/dL — ABNORMAL LOW (ref 8.9–10.3)
Chloride: 106 mmol/L (ref 98–111)
Creatinine, Ser: 0.65 mg/dL (ref 0.44–1.00)
GFR, Estimated: >60 mL/min (ref >60)
Glucose, Bld: 138 mg/dL — ABNORMAL HIGH (ref 70–99)
Potassium: 4.5 mmol/L (ref 3.5–5.1)
Sodium: 135 mmol/L (ref 135–145)

## 2023-04-24 LAB — CBC WITH DIFFERENTIAL/PLATELET
Abs Immature Granulocytes: 0.06 10*3/uL (ref 0.00–0.07)
Basophils Absolute: 0.1 10*3/uL (ref 0.0–0.1)
Basophils Relative: 1 %
Eosinophils Absolute: 0.1 10*3/uL (ref 0.0–0.5)
Eosinophils Relative: 1 %
HCT: 29.9 % — ABNORMAL LOW (ref 36.0–46.0)
Hemoglobin: 9.7 g/dL — ABNORMAL LOW (ref 12.0–15.0)
Immature Granulocytes: 1 %
Lymphocytes Relative: 25 %
Lymphs Abs: 2.7 10*3/uL (ref 0.7–4.0)
MCH: 29.5 pg (ref 26.0–34.0)
MCHC: 32.4 g/dL (ref 30.0–36.0)
MCV: 90.9 fL (ref 80.0–100.0)
Monocytes Absolute: 0.8 10*3/uL (ref 0.1–1.0)
Monocytes Relative: 7 %
Neutro Abs: 6.9 10*3/uL (ref 1.7–7.7)
Neutrophils Relative %: 65 %
Platelets: 504 10*3/uL — ABNORMAL HIGH (ref 150–400)
RBC: 3.29 MIL/uL — ABNORMAL LOW (ref 3.87–5.11)
RDW: 14.5 % (ref 11.5–15.5)
WBC: 10.6 10*3/uL — ABNORMAL HIGH (ref 4.0–10.5)
nRBC: 0 % (ref 0.0–0.2)

## 2023-04-24 LAB — GLUCOSE, CAPILLARY
Glucose-Capillary: 139 mg/dL — ABNORMAL HIGH (ref 70–99)
Glucose-Capillary: 89 mg/dL (ref 70–99)
Glucose-Capillary: 131 mg/dL — ABNORMAL HIGH (ref 70–99)
Glucose-Capillary: 110 mg/dL — ABNORMAL HIGH (ref 70–99)

## 2023-04-24 MED ORDER — DENOSUMAB 60 MG/ML ~~LOC~~ SOSY: 60.0000 mg | PREFILLED_SYRINGE | Freq: Once | SUBCUTANEOUS | Status: DC
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
## 2023-04-24 NOTE — Progress Notes (Signed)
 Occupational Therapy Weekly Progress Note  Patient Details  Name: Rita Taylor MRN: [id number] Date of Birth: 1940/08/30  Beginning of progress report period: April 19, 2023 End of progress report period: April 25, 2023  {[facility] IP REHAB OT TIME CALCULATIONS:[id number]}   Patient has met 0 of 4 short term goals.  ***  Patient continues to demonstrate the following deficits: muscle weakness, decreased cardiorespiratoy endurance, decreased attention to left, decreased attention to right, and decreased motor planning, decreased initiation, decreased attention, decreased awareness, decreased problem solving, decreased safety awareness, decreased memory, and delayed processing, and decreased sitting balance, decreased standing balance, decreased postural control, decreased balance strategies, and difficulty maintaining precautions and therefore will continue to benefit from skilled OT intervention to enhance overall performance with BADL.  Patient progressing toward long term goals..  Continue plan of care.  OT Short Term Goals Week 1:  OT Short Term Goal 1 (Week 1): Pt will complete toileting with Max A with use of DME as needed. OT Short Term Goal 1 - Progress (Week 1): Progressing toward goal OT Short Term Goal 2 (Week 1): Pt will complete LB dressing and bathing with Max A OT Short Term Goal 2 - Progress (Week 1): Progressing toward goal OT Short Term Goal 3 (Week 1): Patient will complete UB bathing and dressing with Max A OT Short Term Goal 3 - Progress (Week 1): Progressing toward goal OT Short Term Goal 4 (Week 1): Pt will complete oral care with Max A. OT Short Term Goal 4 - Progress (Week 1): Progressing toward goal Week 2:  OT Short Term Goal 1 (Week 2): Pt will complete toileting with Max A with use of DME as needed. OT Short Term Goal 2 (Week 2): Pt will complete LB dressing and bathing with Max A OT Short Term Goal 3 (Week 2): Patient will complete UB bathing and dressing with Max  A OT Short Term Goal 4 (Week 2): Pt will complete oral care with Max A.  Skilled Therapeutic Interventions/Progress Updates:    Patient agreeable to participate in OT session. Reports *** pain level.   Patient participated in skilled OT session focusing on ***. Therapist facilitated/assessed/developed/educated/integrated/elicited *** in order to improve/facilitate/promote    Therapy Documentation Precautions:  Precautions Precautions: Cervical, Fall Precaution Booklet Issued: No Recall of Precautions/Restrictions: Impaired Precaution/Restrictions Comments: cervical hard collar OOB and with activity, okay to be off to sleep and eat Required Braces or Orthoses: Cervical Brace Cervical Brace: Hard collar Restrictions Weight Bearing Restrictions Per Provider Order: No  Therapy/Group: Individual Therapy  Limmie Patricia, OTR/L,CBIS  Supplemental OT - [facility] Secure Chat Preferred   04/24/2023, 10:46 PM

## 2023-04-24 NOTE — Addendum Note (Signed)
 Addended by: Warden Fillers on: 04/24/2023 09:26 PM   Modules accepted: Orders

## 2023-04-25 LAB — BASIC METABOLIC PANEL
Anion gap: 8 (ref 5–15)
BUN: 16 mg/dL (ref 8–23)
CO2: 19 mmol/L — ABNORMAL LOW (ref 22–32)
Calcium: 8.2 mg/dL — ABNORMAL LOW (ref 8.9–10.3)
Chloride: 107 mmol/L (ref 98–111)
Creatinine, Ser: 0.66 mg/dL (ref 0.44–1.00)
GFR, Estimated: >60 mL/min (ref >60)
Glucose, Bld: 152 mg/dL — ABNORMAL HIGH (ref 70–99)
Potassium: 4.6 mmol/L (ref 3.5–5.1)
Sodium: 134 mmol/L — ABNORMAL LOW (ref 135–145)

## 2023-04-25 LAB — GLUCOSE, CAPILLARY
Glucose-Capillary: 153 mg/dL — ABNORMAL HIGH (ref 70–99)
Glucose-Capillary: 123 mg/dL — ABNORMAL HIGH (ref 70–99)
Glucose-Capillary: 97 mg/dL (ref 70–99)
Glucose-Capillary: 117 mg/dL — ABNORMAL HIGH (ref 70–99)

## 2023-04-25 LAB — CBC
HCT: 32.4 % — ABNORMAL LOW (ref 36.0–46.0)
Hemoglobin: 10.3 g/dL — ABNORMAL LOW (ref 12.0–15.0)
MCH: 29.1 pg (ref 26.0–34.0)
MCHC: 31.8 g/dL (ref 30.0–36.0)
MCV: 91.5 fL (ref 80.0–100.0)
Platelets: 543 10*3/uL — ABNORMAL HIGH (ref 150–400)
RBC: 3.54 MIL/uL — ABNORMAL LOW (ref 3.87–5.11)
RDW: 14.4 % (ref 11.5–15.5)
WBC: 12.1 10*3/uL — ABNORMAL HIGH (ref 4.0–10.5)
nRBC: 0 % (ref 0.0–0.2)

## 2023-04-25 MED ORDER — GERHARDT'S BUTT CREAM
TOPICAL_CREAM | Freq: Two times a day (BID) | CUTANEOUS | Status: DC
  Filled 2023-04-25: qty 60

## 2023-04-25 MED ORDER — METHYLPHENIDATE HCL 5 MG PO TABS
5.0000 mg | ORAL_TABLET | Freq: Two times a day (BID) | ORAL | Status: DC
  Administered 2023-04-25 – 2023-04-26 (×3): 5 mg via ORAL
  Filled 2023-04-25 (×3): qty 1

## 2023-04-25 NOTE — Progress Notes (Signed)
 Occupational Therapy Session Note  Patient Details  Name: Rita Taylor MRN: [id number] Date of Birth: 04-16-1940  Today's Date: 04/25/2023 OT Individual Time: 4098-1191 OT Individual Time Calculation (min): 32 min    Short Term Goals: Week 2:  OT Short Term Goal 1 (Week 2): Pt will complete toileting with Max A with use of DME as needed. OT Short Term Goal 2 (Week 2): Pt will complete LB dressing and bathing with Max A OT Short Term Goal 3 (Week 2): Patient will complete UB bathing and dressing with Max A OT Short Term Goal 4 (Week 2): Pt will complete oral care with Max A.  Skilled Therapeutic Interventions/Progress Updates:  Pt greeted supine in bed, pt agreeable to OT intervention.      Transfers/bed mobility/functional mobility: pt completed supine>sit with MAX A, step by step cues needed to sequence log roll technique with pt additionally needing assist to initiate all transitional movements. Pt completed squat pivot transfer from EOB>TIS with MAX A. Pt moaning but denies pain. Once in w/c, noted incontinent b/b void, returned pt back to bed for pericare with same level of assist.   Pt completed rolling to R and L side for pericare with MODA, step by step cues needed to motor plan rolling technique.   Pt returned back to supine with total A.   ADLs:  LB dressing: donned brief from bed level with total A  Transfers: squat pivot transfer from EOB<>TIS with MAX A Toileting: total A for 3/3 toileting tasks from bed level   General: donned cervical collar from bed level with total A, pt made minimal efforts to verbalize but did state "oh I did" when directing pts attention to incontinent b/b void. Pt did respond to yes/no questions appropriately.  Ended session with pt supine in bed ( HOB elevated to 30* for tube feed)  with all needs within reach and bed alarm activated.                    Therapy Documentation Precautions:  Precautions Precautions: Cervical,  Fall Precaution Booklet Issued: No Recall of Precautions/Restrictions: Impaired Precaution/Restrictions Comments: cervical hard collar OOB and with activity, okay to be off to sleep and eat Required Braces or Orthoses: Cervical Brace Cervical Brace: Hard collar Restrictions Weight Bearing Restrictions Per Provider Order: No    Therapy/Group: Individual Therapy  Pollyann Glen [facility] 04/25/2023, 9:48 AM

## 2023-04-25 NOTE — Progress Notes (Signed)
 Occupational Therapy Session Note  Patient Details  Name: Rita Taylor MRN: [id number] Date of Birth: [birth year]  Today's Date: 04/26/2023 OT Individual Time: 4696-2952 OT Individual Time Calculation (min): 83 min    Short Term Goals: Week 2:  OT Short Term Goal 1 (Week 2): Pt will complete toileting with Max A with use of DME as needed. OT Short Term Goal 2 (Week 2): Pt will complete LB bathing with Max A OT Short Term Goal 3 (Week 2): Patient will complete UB bathing and dressing with Max A OT Short Term Goal 4 (Week 2): Pt will complete oral care with Max A.  Skilled Therapeutic Interventions/Progress Updates:    Patient agreeable to participate in OT session. Reports no pain at start of session when asked.  Pt required max A to transition from semi supine to sitting EOB.  While seated EOB, pt completed LB dressing while threading both legs into pants without assistance. RW was used to complete sit to stand with Max A and max VC for proper upright standing posture. Pt unable to achieve full upright posture and OT assisted with pulling pants up over hips.  Pt completed step pivot transfer using face to face transfer technique to decrease posterior bias. With Mod A and increased time, pt was able to complete transfer from bed to TIS chair. Total A needed to scoot hips back into seat.   Seated at sink, pt completed UB bathing with Max A, Set-up provided for washing face, total A provided for oral care using suction kit. Some difficulty demonstrated with RUE management in shirt sleeve when doffing and donning new top requiring Mod A to complete task.   OT assisted pt with unopened card on tray table. Pt was able to read front and inside of card when asked with glasses on. Difficulty reading signed name in cursive.   Cervical pads switched with new one and dirty ones hand washed.   Stedy utilized to focus on sit to stand transitions and standing tolerance. Pt completed 3 trials. First  trial, pt stood for 15" and remaining 2 trails, pt stood for 30" each. Visual cue used such as looking at clock on wall to encourage upright standing posture. Physical assist and manual facilitation provided at hips to engage hip extension. Required consistent VC to maintain standing posture d/t muscle weakness and fatigue. Extended seated rest breaks provided in between trials.   Pillows used to provided additional support and positioning needs while seated in TIS. Hand off to NT completed for lunch.   Therapy Documentation Precautions:  Precautions Precautions: Cervical, Fall Precaution Booklet Issued: No Recall of Precautions/Restrictions: Impaired Precaution/Restrictions Comments: cervical collar on at all times Required Braces or Orthoses: Cervical Brace Cervical Brace: Hard collar Restrictions Weight Bearing Restrictions Per Provider Order: No  Therapy/Group: Individual Therapy  Limmie Patricia, OTR/L,CBIS  Supplemental OT - [facility] Secure Chat Preferred   04/26/2023, 7:54 AM

## 2023-04-25 NOTE — Progress Notes (Signed)
 Speech Language Pathology Daily Session Note  Patient Details  Name: Rita Taylor MRN: [id number] Date of Birth: 12/05/1940  Today's Date: 04/25/2023 SLP Individual Time: 0805-0903 SLP Individual Time Calculation (min): 58 min  Short Term Goals: Week 1: SLP Short Term Goal 1 (Week 1): Pt will demonstrate improved mastication during trials of D3 diet textures to determine the potential for diet texture upgrade. SLP Short Term Goal 2 (Week 1): Pt will complete functional problem solving tasks with max A when provided visual and/or verbal cues. SLP Short Term Goal 3 (Week 1): Pt will recall functional information in the current environment using compensatory memory strategies when provided max A. SLP Short Term Goal 4 (Week 1): Patient will sustain attention to task for 10 minutes given max multimodal A.  Skilled Therapeutic Interventions:  Patient was seen in am to address cognitive therapy and dysphagia management. Pt easily alerted upon SLP arrival. Pt was repositioned upright in bed however, noted declining position across session requiring frequent repositioning. SLP completed thorough oral hygiene with pt inconsistently following 1 step directions. Pt was presented with small ice chips with pt consistently able to lateralize, masticate and initiate swallow. She tolerated 7-8 ice chips without s/sx asp. SLP also presented D1 textures via 1/3-1/2 tsp. Pt with adequate bolus manipulation and transit with no observed lingual residue post swallow. Again, she demo no s/sx asp. SLP recommending diet upgrade to Dys 1 and nectar liquids given full supervision. Pt may continue trials of ice chips between meals with adequate oral hygiene. Cognition addressed through orientation and recall of personal infromation. Pt consistently able to verbalize her name and DOB. She was unable to recall age on multiple atttempts. She verbalized her children's names and their spouses with sup A. Pt warranting between  min- mod A throughout session for orientation to month and year. She was oriented to setting ranging from min to max A throughout session. She was max A for situation. Immediate recall addressed in order to facilitate carryover of information with pt able to immediately recall functional information with 75% ac and mod cues. SLP addressed problem solving through training in use of call button. Poor carryover across trials with pt frequently reverting back to identification of call button as an alarm. She required max to total A to identify uses of call button. Pt may benefit from spaced retrieval technique to facilitate recall of information  At conclusion of session, pt was left upright in bed with call button within reach and bed alarm active. SLP to continue POC.   Pain Pain Assessment Pain Scale: 0-10 Pain Score: 0-No pain  Therapy/Group: Individual Therapy  Renaee Munda 04/25/2023, 8:51 AM

## 2023-04-25 NOTE — Progress Notes (Signed)
 PROGRESS NOTE   Subjective/Complaints:  WBC back up to 12 from 10 3/9, platelets remain uptrending, other labs stable. Vitals significant for mild hypertension in the 140s over 70s. Significant fatigue over the weekend limiting therapies progress.  On exam, slightly more alert than prior.  Oriented only to self.  No complaints.  ROS: Denies fevers, chills, N/V, abdominal pain, constipation, diarrhea, SOB, cough, chest pain, new weakness or paraesthesias.   + Confusion  Objective:   No results found.  Recent Labs    04/24/23 0736 04/25/23 0604  WBC 10.6* 12.1*  HGB 9.7* 10.3*  HCT 29.9* 32.4*  PLT 504* 543*    Recent Labs    04/23/23 0641 04/24/23 0736  NA 133* 135  K 4.5 4.5  CL 105 106  CO2 21* 23  GLUCOSE 129* 138*  BUN 15 18  CREATININE 0.66 0.65  CALCIUM 8.0* 7.9*    Intake/Output Summary (Last 24 hours) at 04/25/2023 0740 Last data filed at 04/25/2023 6578 Gross per 24 hour  Intake 1555.55 ml  Output --  Net 1555.55 ml        Physical Exam: Vital Signs Blood pressure (!) 145/70, pulse 98, temperature 98.4 F (36.9 C), temperature source Oral, resp. rate 18, height 5\' 2"  (1.575 m), weight 44.2 kg, SpO2 98%.   General: No apparent distress, Cachectic.  Lying in bed. HEENT: + staples over shunt site, oral mucosa dry + NGT Left eye tape/gauze --no more erythema or injection, no discharge -- not reassessed Neck: Supple without JVD or lymphadenopathy Heart: Reg rate and rhythm. No m/r/g appreciated Chest: CTAB this morning, although poor inspiratory effort, no coughing heard Abdomen: Soft, non-tender, non-distended, bowel sounds positive.  Extremities: No clubbing, cyanosis, or edema.  Psych: More alert than prior exams, flat, Skin:  bruises noted Anterior and posterior surgical incisions CDI Staples over cranial incisions  intact   Neuro:     Mental Status: AAO to person, not place or time  with cues  + Poor memory and concentration, poor carryover--ongoing, waxes and wanes + L neglect -not appreciated today Speech/Languate: follows simple commands, minimal verbalizations today--responses less delayed. CRANIAL NERVES: EOMI R eye, decreased hearing L, L facial weakness-chronic   MOTOR: Moving all 4 extremities in bed, antigravity and against resistance. No apparent ataxia in upper or lower extremities SENSORY: Normal to touch all 4 extremities   Assessment/Plan: 1. Functional deficits which require 3+ hours per day of interdisciplinary therapy in a comprehensive inpatient rehab setting. Physiatrist is providing close team supervision and 24 hour management of active medical problems listed below. Physiatrist and rehab team continue to assess barriers to discharge/monitor patient progress toward functional and medical goals  Care Tool:  Bathing    Body parts bathed by patient: Face, Abdomen   Body parts bathed by helper: Right arm, Left arm, Chest, Front perineal area, Buttocks, Right upper leg, Left upper leg, Right lower leg, Left lower leg     Bathing assist Assist Level: Total Assistance - Patient < 25%     Upper Body Dressing/Undressing Upper body dressing   What is the patient wearing?: Pull over shirt    Upper body assist Assist Level: Total  Assistance - Patient < 25%    Lower Body Dressing/Undressing Lower body dressing      What is the patient wearing?: Incontinence brief, Pants     Lower body assist Assist for lower body dressing: Total Assistance - Patient < 25%     Toileting Toileting    Toileting assist Assist for toileting: Dependent - Patient 0%     Transfers Chair/bed transfer  Transfers assist  Chair/bed transfer activity did not occur: Safety/medical concerns (unsafe to get up)  Chair/bed transfer assist level: Moderate Assistance - Patient 50 - 74%     Locomotion Ambulation   Ambulation assist   Ambulation activity did  not occur: Safety/medical concerns          Walk 10 feet activity   Assist  Walk 10 feet activity did not occur: Safety/medical concerns        Walk 50 feet activity   Assist Walk 50 feet with 2 turns activity did not occur: Safety/medical concerns         Walk 150 feet activity   Assist Walk 150 feet activity did not occur: Safety/medical concerns         Walk 10 feet on uneven surface  activity   Assist Walk 10 feet on uneven surfaces activity did not occur: Safety/medical concerns         Wheelchair     Assist Is the patient using a wheelchair?: Yes Type of Wheelchair: Manual    Wheelchair assist level: Dependent - Patient 0%      Wheelchair 50 feet with 2 turns activity    Assist        Assist Level: Dependent - Patient 0%   Wheelchair 150 feet activity     Assist      Assist Level: Dependent - Patient 0%   Blood pressure (!) 145/70, pulse 98, temperature 98.4 F (36.9 C), temperature source Oral, resp. rate 18, height 5\' 2"  (1.575 m), weight 44.2 kg, SpO2 98%.  Medical Problem List and Plan: 1. 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  Antithrombotics: -DVT/anticoagulation:  Pharmaceutical: Lovenox 30mg  daily added             -antiplatelet therapy: ASA to start today.   3. Pain Management:  tylenol prn -3/5: DC gabapentin, oxycodone due to no complaints and ongoing lethargy/delirium 3-7: Patient denying pain, but holding her head and bring her legs up to her chest. ?  Headache, ?  Chronic severe low back pain contributing to behaviors (on chart review, has been severe up until now).  Resume gabapentin 300 mg nightly, scheduled Tylenol 1000 mg 3 times daily. -04/24/23 seems to be doing better this  weekend 3-10: Denies pain, headaches.  4. Mood/Behavior/Sleep:  LCSW to follow for evaluation and support.              -antipsychotic agents: N/A             -Continue Wellbutrin  - 3/7: Sleeping intermittently 7 to 8 hours per log, interrupted frequently.  See above.  5. Neuropsych/cognition: This patient is not fully capable of making decisions on her own behalf. -3-6: Cognition and attention remains poor, unsure if delirium,  and TBI, or other contributing cause.  With worsening hyponatremia, will undergo endocrinologic workup as below.  Will consider stimulant if no improvement with resolution of hyponatremia. 3/7: Gabapentin 300 mg nightly for sleep and pain, scheduled Tylenol as above for pain, treat aspiration pneumonia as below.  If no improvement over the weekend, will initiate neurostimulant on Monday. --Addendum: Provided medical update to patient's son, requesting CT head for shunt evaluation given concern for patient headaches that she is not reporting and worsening cognition.  Feel this is a reasonable request, ordered routine CT head without contrast. -04/23/23 head CT last night with no hydrocephalus, fairly stable. Pt still fairly fatigued but arouseable.  -04/24/23 more awake today but still fairly fatigued-- neurostimulant might help 3-10: Start Ritalin 5 mg twice daily  6. Skin/Wound Care: Routine pressure   -Staple removal on 3/11-14  7. Fluids/Electrolytes/Nutrition: Recheck CMET in am.             -See below  8. C6 Fx with instability/T3 burst Fx: Treated wit  ORIF and C4-T4 arthrodesis.              --C collar when out of bed.  -3-7: Patient tends to keep neck extended in bed, mouth open, which is contributing to throat dryness and worsening swallowing; will keep collar on in bed for now -Attempted to obtain better fitting collar, no small available in [place], pediatric did not fit. -3-10: Will be 4 weeks postop 3-14, will check in this week about when postop x-rays may  be done for collar clearance.  9. HTN/Tachycardia: Will monitor BP/HR TID--continue Cozaar and lopressor 25mg  BID.             --monitor for orthostatic changes.   -Normotensive  -3-6: Reduce Cozaar to 50 mg daily due to hyponatremia.  Labs in AM. -3-7: Blood pressure slightly elevated, okay given age and hydrocephalus, monitor for now -3/8-10/25 BPs stable on slightly higher end, monitor  Vitals:   04/22/23 1423 04/22/23 1826 04/22/23 2059 04/23/23 0357  BP: (!) 148/66 (!) 143/55 (!) 143/55 (!) 141/63   04/23/23 1339 04/23/23 1957 04/23/23 2044 04/24/23 0338  BP: 115/62 (!) 140/61 (!) 140/61 (!) 155/67   04/24/23 1145 04/24/23 2019 04/24/23 2114 04/25/23 0442  BP: (!) 166/76 (!) 144/68 (!) 144/68 (!) 145/70     10. Leucocytosis: Resolving--monitor for fevers or others signs of infection. WBC 13.7 3/3    -Stable 3-4  11. H/o Depression: Change Wellbutrin XL to 75 mg bid as cannot be crushed. -3-5: May benefit from addition of mirtazapine for appetite and depression; will see first if increase Megace helps. 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 Dysphagia: On D2 diet with thin liquids via medicine cup--question provale cup?             --Will add full supervision and assist with meals. Has to be upright for meals. -3-7: Make n.p.o. due to aspiration pneumonia; swallow study done by SLP today, results pending.  Would keep n.p.o. for now--p.o. intakes have been minimal anyway. 3-10: Starting neurostimulant as above; doing well.  Will discuss with SLP with diet resumption today--agree with dysphagia 1  13. Delirium: Will continue delirium precautions. Start sleep chart   - No sleep log overnight, labs today  -Sleeping overnight per reports.  -3-5: Limit deliriogenic medications, DC gabapentin, oxycodone. -3-6: Ongoing; will get cortisol, TSH, free T4, CMP, and CBC in a.m. Urinalysis  today  negative.  Will get chest x-ray given cough. -3/7: AM cortisol, thyroid studies normal; sodium improving.  WBC remains elevated, chest x-ray with aspiration pneumonia, treating as below.  14. H/o 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with recurrence: Size stable per NS.  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 NA 132 on 3/3             --recheck Na level in am.--Stable 132.   3-5: Recheck sodium in AM. 3-6: Labs consistent with SIADH; Na 129.  is getting only 800 cc/day of water flushes through tube, no additional p.o. intakes.  Unable to adjust to saline flushes, so we will add salt tabs 1 mg twice daily through the tube, reduce losartan, and undergo endocrinologic workup with a.m. cortisol and thyroid studies. 3/7: Sodium improved to 131 today, continue salt tabs and treat pneumonia as below.  Daily labs over this weekend. -04/24/23 Na 135 now, doing well this weekend 3-10: NA 134, stable.  Continue current regimen.  16. ABLA: Will monitor with serial checks.  HGB 9.6 3/3   - Stable 04/23/23 at 9.3 -- 9.7 on 04/24/23  17. Pre-diabetes: Hgb A1C- 6.0 a year ago. Will recheck in am. Monitor fasting BS for now.    -3-4: Add SSI overnight during tube feeds.--Well-controlled.  -04/24/23 doing well, monitor Recent Labs    04/24/23 1647 04/24/23 2039 04/25/23 0555  GLUCAP 131* 110* 153*     18. Severe malnutrition   --continue tube feeds at nights. Decrease to 65 cc/10 hours at nights --per friends patient did not eat much at baseline. Now on D2 diet with fatigue             --will ask nursing to save food slips. Continue Megace.   -3-3: Dietary initiating calorie count. - 3/5: Increase megace to BID.  If no improvement, will need to discuss possible PEG tube. -3-7: DC Megace, has not improved appetite at twice daily scheduling and could be affecting patient's cognition. 3-10: Resume p.o. diet; encourage as above  19..  Aspiration pneumonia  - seen on CXR 3/6 3/7: Discussed  antibiotics with pharmacy, adequate for monotherapy with Zosyn 3.75 mg every 8 hours -N.p.o., head of bed elevated greater than 30 degrees for continuous tube feeds  - Omeprazole 20 mg BID via tube to prevent TF reflux - Daily labs ordered over this weekend -04/23/23 WBC down to 11.0 --> 10.6 --> 12 3/9  20.  Hypocalcemia.  On Os-Cal 3 times daily for supplementation.  Vitamin D within normal limits. 21.  Left eye injection.  Improved with use of erythromycin eyedrops; has topical ointment  -3-10: DC erythromycin  LOS: 7 days A FACE TO FACE EVALUATION WAS PERFORMED  Angelina Sheriff 04/25/2023, 7:40 AM

## 2023-04-25 NOTE — Progress Notes (Signed)
 Physical Therapy Session Note  Patient Details  Name: Rita Taylor MRN: [id number] Date of Birth: 1941/01/28  Today's Date: 04/25/2023 PT Individual Time: 1338-1440 PT Individual Time Calculation (min): 62 min   Short Term Goals: Week 1:  PT Short Term Goal 1 (Week 1): Pt will complete bed mobility with min assist PT Short Term Goal 2 (Week 1): Pt will complete transfers with mod assist and LRAD PT Short Term Goal 3 (Week 1): Pt will ambulate 21' with RW mod assist  Skilled Therapeutic Interventions/Progress Updates:    Pt presents in room in [facility] with pt demonstrating poor positioning with pt demonstrating R lateral lean L hip/knee flexion with foot placed on WC seat. Pt denies pain and demonstrating improved alertness. Pt noted to be incontinent of bowel while seated in WC, pt unaware. Session focused on transfer training, tolerance to upright, and bed mobility training to facilitate participation with self care tasks and decreased burden of care.  Pt positioned in front of sink and completes stand with mod progress to min assist to sink, maintains standing balance with mod assist initiatially progress to CGA assist with increased time standing with improved anterior wt shift. Pt completes x3 stands, pt able to maintain standing 1-2 minutes with each stand with therapist and 2nd person providing total assist for periarea hygiene, clothing and brief management. Pt demonstrates bowel incontinence and leaky bowel during periarea hygiene, unaware of incontinence. After hygiene and clothing change pt transported to main gym dependently for time management. Pt completes stand to RW with max assist, once in standing, pt noted to have bowel incontinence requiring return to room via WC.  Pt transfers to bed with total assist stand pivot, requires total assist for sit to supine as pt unable to sequence bed mobility. Pt requires total assist for clothing and brief management as well as periarea hygiene,  max verbal/tactile/visual cues for using bed rails to assist with bed mobility however pt with difficulty attending to task requiring heavy mod assist to R and max assist to L. Pt demonstrating pain avoidant behaviors for rolling to L side. Pt provided with frequent cues for raising BUEs overhead during periarea hygiene to prevent contamination as well as promote BUE endurance however pt with significant difficulty and weakness with task.  Pt remains supine in bed with all needs within reach, call light in place, pillows placed for positioning, and bed alarm activated at end of session. Pt RN notified of bowel incontinence and leaky bowel at end of session.   Therapy Documentation Precautions:  Precautions Precautions: Cervical, Fall Precaution Booklet Issued: No Recall of Precautions/Restrictions: Impaired Precaution/Restrictions Comments: cervical collar on at all times Required Braces or Orthoses: Cervical Brace Cervical Brace: Hard collar Restrictions Weight Bearing Restrictions Per Provider Order: No General: PT Amount of Missed Time (min): 13 Minutes PT Missed Treatment Reason: Other (Comment) (bowel incontinence, leaky bowel)   Therapy/Group: Individual Therapy  Edwin Cap PT, DPT 04/25/2023, 3:39 PM

## 2023-04-25 NOTE — Plan of Care (Signed)
  Problem: RH Balance Goal: LTG Patient will maintain dynamic standing balance (PT) Description: LTG:  Patient will maintain dynamic standing balance with assistance during mobility activities (PT) Flowsheets (Taken 04/25/2023 1612) LTG: Pt will maintain dynamic standing balance during mobility activities with:: Minimal Assistance - Patient > 75% Note: Downgraded due to pt current progress   Problem: RH Ambulation Goal: LTG Patient will ambulate in controlled environment (PT) Description: LTG: Patient will ambulate in a controlled environment, # of feet with assistance (PT). Flowsheets (Taken 04/25/2023 1612) LTG: Pt will ambulate in controlled environ  assist needed:: Moderate Assistance - Patient 50 - 74% LTG: Ambulation distance in controlled environment: 56' Note: Downgraded due to pt current progress Goal: LTG Patient will ambulate in home environment (PT) Description: LTG: Patient will ambulate in home environment, # of feet with assistance (PT). Flowsheets (Taken 04/25/2023 1612) LTG: Pt will ambulate in home environ  assist needed:: Minimal Assistance - Patient > 75% LTG: Ambulation distance in home environment: 25' Note: Downgraded due to pt current progress   Problem: RH Stairs Goal: LTG Patient will ambulate up and down stairs w/assist (PT) Description: LTG: Patient will ambulate up and down # of stairs with assistance (PT) Flowsheets (Taken 04/25/2023 1612) LTG: Pt will ambulate up/down stairs assist needed:: Moderate Assistance - Patient 50 - 74% Note: Downgraded due to pt current progress

## 2023-04-25 NOTE — Discharge Instructions (Signed)
 Inpatient Rehab Discharge Instructions  KAYLN GARCEAU Discharge date and time:    Activities/Precautions/ Functional Status: Activity: no lifting, driving, or strenuous exercise for till cleared by MD Diet:  Wound Care: keep wound clean and dry   Functional status:  ___ No restrictions     ___ Walk up steps independently _X__ 24/7 supervision/assistance   ___ Walk up steps with assistance ___ Intermittent supervision/assistance  ___ Bathe/dress independently ___ Walk with walker     _X__ Bathe/dress with assistance ___ Walk Independently    ___ Shower independently ___ Walk with assistance    ___ Shower with assistance _X__ No alcohol     ___ Return to work/school ________  Special Instructions:    My questions have been answered and I understand these instructions. I will adhere to these goals and the provided educational materials after my discharge from the hospital.  Patient/Caregiver Signature _______________________________ Date __________  Clinician Signature _______________________________________ Date __________  Please bring this form and your medication list with you to all your follow-up doctor's appointments.

## 2023-04-26 ENCOUNTER — Inpatient Hospital Stay (HOSPITAL_COMMUNITY)

## 2023-04-26 LAB — GLUCOSE, CAPILLARY
Glucose-Capillary: 110 mg/dL — ABNORMAL HIGH (ref 70–99)
Glucose-Capillary: 113 mg/dL — ABNORMAL HIGH (ref 70–99)
Glucose-Capillary: 124 mg/dL — ABNORMAL HIGH (ref 70–99)
Glucose-Capillary: 132 mg/dL — ABNORMAL HIGH (ref 70–99)

## 2023-04-26 MED ORDER — METHYLPHENIDATE HCL 5 MG PO TABS
10.0000 mg | ORAL_TABLET | Freq: Two times a day (BID) | ORAL | Status: DC
  Administered 2023-04-26 – 2023-04-27 (×3): 10 mg via ORAL
  Filled 2023-04-26 (×3): qty 2

## 2023-04-26 MED ORDER — MIRTAZAPINE 15 MG PO TABS
7.5000 mg | ORAL_TABLET | Freq: Every day | ORAL | Status: DC
  Administered 2023-04-26: 7.5 mg via ORAL
  Filled 2023-04-26: qty 1

## 2023-04-26 MED ORDER — BANATROL TF EN LIQD
60.0000 mL | Freq: Two times a day (BID) | ENTERAL | Status: DC
  Administered 2023-04-26 – 2023-04-27 (×3): 60 mL
  Filled 2023-04-26 (×3): qty 60

## 2023-04-26 MED ORDER — LOPERAMIDE HCL 2 MG PO CAPS
2.0000 mg | ORAL_CAPSULE | Freq: Three times a day (TID) | ORAL | Status: DC | PRN
  Administered 2023-04-26: 2 mg via ORAL
  Filled 2023-04-26: qty 1

## 2023-04-26 NOTE — Progress Notes (Signed)
 PROGRESS NOTE   Subjective/Complaints:  Ate 20% of 1 meal yesterday, no breakfast this morning. Vital stable, some hypertension yesterday but a little bit low this a.m.  No fevers, satting well. Multiple episodes of bowel incontinence yesterday, type VI and VII  ROS: Unable to obtain ROS due to patient cognitive status.    Objective:   No results found.  Recent Labs    04/24/23 0736 04/25/23 0604  WBC 10.6* 12.1*  HGB 9.7* 10.3*  HCT 29.9* 32.4*  PLT 504* 543*    Recent Labs    04/24/23 0736 04/25/23 0604  NA 135 134*  K 4.5 4.6  CL 106 107  CO2 23 19*  GLUCOSE 138* 152*  BUN 18 16  CREATININE 0.65 0.66  CALCIUM 7.9* 8.2*    Intake/Output Summary (Last 24 hours) at 04/26/2023 0925 Last data filed at 04/26/2023 0806 Gross per 24 hour  Intake 50 ml  Output --  Net 50 ml        Physical Exam: Vital Signs Blood pressure 93/78, pulse 99, temperature 98 F (36.7 C), resp. rate 16, height 5\' 2"  (1.575 m), weight 45.5 kg, SpO2 99%.   General: No apparent distress, Cachectic.  Lying in bed. HEENT: + staples over shunt site, oral mucosa dry + NGT Left eye tape/gauze --no discharge.  neck: Supple without JVD or lymphadenopathy Heart: Reg rate and rhythm. No m/r/g appreciated Chest: CTAB this morning, although poor inspiratory effort, no coughing heard Abdomen: Soft, non-tender, non-distended, bowel sounds positive.  Extremities: No clubbing, cyanosis, or edema.  Psych: Alert, flat Skin:  Anterior and posterior surgical incisions CDI Staples over cranial incisions  intact Peripheral IV CDI  Neuro:     Mental Status: AAO to person, not place or time with cues  + Poor memory and concentration, poor carryover--ongoing, waxes and wanes--worse attention today + L neglect -not appreciated today Speech/Languate: follows simple commands, minimal verbalizations today--responses less delayed.  CRANIAL  NERVES: EOMI R eye, decreased hearing L, L facial weakness-chronic   MOTOR: Moving all 4 extremities in bed, 5- out of 5 throughout No coordination deficits. SENSORY: Normal to touch all 4 extremities  Unchanged 3-11  Assessment/Plan: 1. Functional deficits which require 3+ hours per day of interdisciplinary therapy in a comprehensive inpatient rehab setting. Physiatrist is providing close team supervision and 24 hour management of active medical problems listed below. Physiatrist and rehab team continue to assess barriers to discharge/monitor patient progress toward functional and medical goals  Care Tool:  Bathing    Body parts bathed by patient: Face, Abdomen   Body parts bathed by helper: Right arm, Left arm, Chest, Front perineal area, Buttocks, Right upper leg, Left upper leg, Right lower leg, Left lower leg     Bathing assist Assist Level: Total Assistance - Patient < 25%     Upper Body Dressing/Undressing Upper body dressing   What is the patient wearing?: Pull over shirt    Upper body assist Assist Level: Total Assistance - Patient < 25%    Lower Body Dressing/Undressing Lower body dressing      What is the patient wearing?: Incontinence brief, Pants     Lower body assist  Assist for lower body dressing: Total Assistance - Patient < 25%     Toileting Toileting    Toileting assist Assist for toileting: Dependent - Patient 0%     Transfers Chair/bed transfer  Transfers assist  Chair/bed transfer activity did not occur: Safety/medical concerns (unsafe to get up)  Chair/bed transfer assist level: Moderate Assistance - Patient 50 - 74%     Locomotion Ambulation   Ambulation assist   Ambulation activity did not occur: Safety/medical concerns          Walk 10 feet activity   Assist  Walk 10 feet activity did not occur: Safety/medical concerns        Walk 50 feet activity   Assist Walk 50 feet with 2 turns activity did not occur:  Safety/medical concerns         Walk 150 feet activity   Assist Walk 150 feet activity did not occur: Safety/medical concerns         Walk 10 feet on uneven surface  activity   Assist Walk 10 feet on uneven surfaces activity did not occur: Safety/medical concerns         Wheelchair     Assist Is the patient using a wheelchair?: Yes Type of Wheelchair: Manual    Wheelchair assist level: Dependent - Patient 0%      Wheelchair 50 feet with 2 turns activity    Assist        Assist Level: Dependent - Patient 0%   Wheelchair 150 feet activity     Assist      Assist Level: Dependent - Patient 0%   Blood pressure 93/78, pulse 99, temperature 98 F (36.7 C), resp. rate 16, height 5\' 2"  (1.575 m), weight 45.5 kg, SpO2 99%.  Medical Problem List and Plan: 1. 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 Hasn't walked since last week. Downgraded goals to Mod A. OT stood EOB and participated in washing face and upper body; did Mod A with UBD dressing. SPT Mod-Max A. Back on Dys 1 and nectar thick diet. Alertness is limiting, certain to be aspirating due to inattention.    - Family meeting Thursday at 10 am--family later indicates that they are willing to pursue SNF, Thursday meeting canceled.  Will need to discuss transition to SNF plans including nutritional access and postsurgical follow-ups.  2.  Antithrombotics: -DVT/anticoagulation:  Pharmaceutical: Lovenox 30mg  daily added             -antiplatelet therapy: ASA to start today.   3. Pain Management:  tylenol prn -3/5: DC gabapentin, oxycodone due to no complaints and  ongoing lethargy/delirium 3-7: Patient denying pain, but holding her head and bring her legs up to her chest. ?  Headache, ?  Chronic severe low back pain contributing to behaviors (on chart review, has been severe up until now).  Resume gabapentin 300 mg nightly, scheduled Tylenol 1000 mg 3 times daily. -04/24/23 seems to be doing better this weekend 3-10: More alert today, thoroughly denies pain, headaches.  DC gabapentin.   4. Mood/Behavior/Sleep:  LCSW to follow for evaluation  and support.              -antipsychotic agents: N/A             -Continue Wellbutrin  - 3/7: Sleeping intermittently 7 to 8 hours per log, interrupted frequently.  See above. - 3/11: Start mirtazepine 7.5 mg at bedtime for appetite and mood  5. Neuropsych/cognition: This patient is not fully capable of making decisions on her own behalf. -3-6: Cognition and attention remains poor, unsure if delirium, and TBI, or other contributing cause.  With worsening hyponatremia, will undergo endocrinologic workup as below.  Will consider stimulant if no improvement with resolution of hyponatremia. 3/7: Gabapentin 300 mg nightly for sleep and pain, scheduled Tylenol as above for pain, treat aspiration pneumonia as below.  If no improvement over the weekend, will initiate neurostimulant on Monday. --Addendum: Provided medical update to patient's son, requesting CT head for shunt evaluation given concern for patient headaches that she is not reporting and worsening cognition.  Feel this is a reasonable request, ordered routine CT head without contrast. -04/23/23 head CT last night with no hydrocephalus, fairly stable. Pt still fairly fatigued but arouseable.  -04/24/23 more awake today but still fairly fatigued-- neurostimulant might help 3-10: Start Ritalin 5 mg twice daily--did a bit better yesterday  6. Skin/Wound Care: Routine pressure   -Staple removal on 3/11-14--order placed 3-11   - 3/11: New fissure on perineum per nursing;  gerdhardt's cream  7. Fluids/Electrolytes/Nutrition: Recheck CMET in am.             -See below  8. C6 Fx with instability/T3 burst Fx: Treated wit  ORIF and C4-T4 arthrodesis.              --C collar when out of bed.  -3-7: Patient tends to keep neck extended in bed, mouth open, which is contributing to throat dryness and worsening swallowing; will keep collar on in bed for now -Attempted to obtain better fitting collar, no small available in [place], pediatric did not fit. -3-11: Postop x-rays obtained per Dr. Marcell Barlow, hardware intact.  Awaiting recommendations  9. HTN/Tachycardia: Will monitor BP/HR TID--continue Cozaar and lopressor 25mg  BID.             --monitor for orthostatic changes.   -Normotensive  -3-6: Reduce Cozaar to 50 mg daily due to hyponatremia.  Labs in AM. -3-7: Blood pressure slightly elevated, okay given age and hydrocephalus, monitor for now -3/8-10/25 BPs stable on slightly higher end, monitor 3-11: BP is low this a.m.  Vitals:   04/23/23 0357 04/23/23 1339 04/23/23 1957 04/23/23 2044  BP: (!) 141/63 115/62 (!) 140/61 (!) 140/61   04/24/23 0338 04/24/23 1145 04/24/23 2019 04/24/23 2114  BP: (!) 155/67 (!) 166/76 (!) 144/68 (!) 144/68   04/25/23 0442 04/25/23 1305 04/25/23 2015 04/26/23 0445  BP: (!) 145/70 (!) 120/55 (!) 139/57 93/78     10. Leucocytosis: Resolving--monitor for fevers or others signs of infection. WBC 13.7 3/3    -Stable 3-4   - 3-12 AM recheck, remains without fevers but was up from 10-12 on last check  11. H/o Depression: Change Wellbutrin XL to 75 mg bid as cannot be crushed. -3-5: May benefit from addition of mirtazapine for appetite and depression; will see first if increase Megace helps. 3/6: Discussed wellbutrin with patient, feel risk of increased depression with removal is greater than possible contribution to SIADH 3-11: Mirtazapine as above  12. Dysphagia: On D2 diet with thin liquids via medicine cup--question  provale cup?              --Will add full supervision and assist with meals. Has to be upright for meals. -3-7: Make n.p.o. due to aspiration pneumonia; swallow study done by SLP today, results pending.  Would keep n.p.o. for now--p.o. intakes have been minimal anyway. 3-10: Starting neurostimulant as above; doing well.  Will discuss with SLP with diet resumption today--agree with dysphagia 1 3-11: Per SLP, certain she is aspirating intermittently given waxing and waning cognitive status.  13. Delirium: Will continue delirium precautions. Start sleep chart   - No sleep log overnight, labs today  -Sleeping overnight per reports.  -3-5: Limit deliriogenic medications, DC gabapentin, oxycodone. -3-6: Ongoing; will get cortisol, TSH, free T4, CMP, and CBC in a.m. Urinalysis today negative.  Will get chest x-ray given cough. -3/7: AM cortisol, thyroid studies normal; sodium improving.  WBC remains elevated, chest x-ray with aspiration pneumonia, treating as below.  14. H/o 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with recurrence: Size stable per NS.  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 NA 132 on 3/3             --recheck Na level in am.--Stable 132.   3-5: Recheck sodium in AM. 3-6: Labs consistent with SIADH; Na 129.  is getting only 800 cc/day of water flushes through tube, no additional p.o. intakes.  Unable to adjust to saline flushes, so we will add salt tabs 1 mg twice daily through the tube, reduce losartan, and undergo endocrinologic workup with a.m. cortisol and thyroid studies. 3/7: Sodium improved to 131 today, continue salt tabs and treat pneumonia as below.  Daily labs over this weekend. -04/24/23 Na 135 now, doing well this weekend 3-10: NA 134, stable.  Continue current regimen. Repeat labs 3-12  16. ABLA: Will monitor with serial checks.  HGB 9.6 3/3   - Stable 04/23/23 at 9.3 -- 9.7 on 04/24/23  17. Pre-diabetes: Hgb A1C- 6.0 a year ago. Will recheck in am. Monitor fasting BS for now.     -3-4: Add SSI overnight during tube feeds.--Well-controlled.  -doing well, monitor Recent Labs    04/25/23 2121 04/26/23 0052 04/26/23 0624  GLUCAP 117* 110* 132*     18. Severe malnutrition   --continue tube feeds at nights. Decrease to 65 cc/10 hours at nights --per friends patient did not eat much at baseline. Now on D2 diet with fatigue             --will ask nursing to save food slips. Continue Megace.   -3-3: Dietary initiating calorie count. - 3/5: Increase megace to BID.  If no improvement, will need to discuss possible PEG tube. -3-7: DC Megace, has not improved appetite at twice daily scheduling and could be affecting patient's cognition. 3-10: Resume p.o. diet; encourage as above 3/11: Start Mirtazepine 7.5 mg at bedtime as above; family has been reluctant with PEG tube in the past but patient will likely need tube feeds in the long-term and will need to transition to PEG if pursuing SNF; will discuss with family in a.m.  19..  Aspiration pneumonia  - seen on CXR 3/6 3/7: Discussed antibiotics with pharmacy, adequate for monotherapy with Zosyn 3.75 mg every 8 hours -N.p.o., head of bed elevated greater than 30 degrees for continuous tube feeds  - Omeprazole 20 mg BID via tube to prevent TF reflux - Daily labs ordered over this weekend -04/23/23 WBC down to 11.0 --> 10.6 --> 12 3/9 3-11: Clinically stable, cervical x-ray today  showed right middle lobe consolidation/pneumonia, not producing sputum to culture.  Will repeat labs in a.m. and if WBC uptrending will broaden antibiotics.  20.  Hypocalcemia.  On Os-Cal 3 times daily for supplementation.  Vitamin D within normal limits.  21.  Left eye injection.  Improved with use of erythromycin eyedrops; has topical ointment  -3-10: DC erythromycin  22.  Bowel and bladder incontinence, diarrhea.   - Incontinence has been ongoing since first admission, not responsive to timed toileting, without retention, negative for UTI   -  Diarrhea likely secondary to tube feeds and antibiotics; add Banatrol 20 mg twice daily and as needed Imodium   LOS: 8 days A FACE TO FACE EVALUATION WAS PERFORMED  Angelina Sheriff 04/26/2023, 9:25 AM

## 2023-04-26 NOTE — Progress Notes (Signed)
 Speech Language Pathology Daily Session Note  Patient Details  Name: Rita Taylor MRN: [id number] Date of Birth: 08/07/1940  Today's Date: 04/26/2023 SLP Individual Time: 1345-1445 SLP Individual Time Calculation (min): 60 min  Short Term Goals: Week 1: SLP Short Term Goal 1 (Week 1): Pt will demonstrate improved mastication during trials of D3 diet textures to determine the potential for diet texture upgrade. SLP Short Term Goal 2 (Week 1): Pt will complete functional problem solving tasks with max A when provided visual and/or verbal cues. SLP Short Term Goal 3 (Week 1): Pt will recall functional information in the current environment using compensatory memory strategies when provided max A. SLP Short Term Goal 4 (Week 1): Patient will sustain attention to task for 10 minutes given max multimodal A.  Skilled Therapeutic Interventions:   Pt greeted in her room. She was awake in her wheelchair upon SLP arrival. SLP facilitated tx tasks targeting cognition and dysphagia. SLP facilitated conversational task targeting attention and initiation of responses. She was able to respond to 2/20 questions. Remaining responses were whispered phrases consisting of counting "[id number]" or "22, 23, 24, 25." Noted to tap in unison with counting and SLP was unable to redirect for any appropriate verbal responses. She did participate in PO trials of thin liquids via straw, mildly thick/nectar thick liquids via straw, and ice cream (puree). She presented w/ s/s of airway invasion throughout, varying from immediate to delayed cough. Only noted to bolus hold x2, though anticipate initiation and focused attention deficits resulted in mistiming of swallow initiation and/or bolus misdirection. PO trials discontinued d/t inattention. Recommend cont diet of Dys 1/nectar thick liquids at this time, however, she may benefit from NPO status again if overall safety and efficiency remains this variable. At the end of tx tasks, she  was left in her chair with the alarm set and call light within reach. Recommend cont ST.   Pain  No pain reported  Therapy/Group: Individual Therapy  Pati Gallo 04/26/2023, 3:56 PM

## 2023-04-26 NOTE — Progress Notes (Signed)
 Patient ID: Rita Taylor, female   DOB: 1940/03/29, 83 y.o.   MRN: [id number]  [id number] spoke with pt son Selinda Orion, to provide updates from team conference. He stated he was here earlier to see his mother, and after visiting, he did not think they would be able to provide the care for her, along with their father as well and would like if we can find her somewhere to go. SW shared will discuss with the team if a family meeting is needed since the plan of care has now changed. SW will follow-up with updates.   Cecile Sheerer, MSW, LCSW Office: [phone] Cell: [phone] Fax: [phone]

## 2023-04-26 NOTE — Plan of Care (Signed)
  Problem: Sit to Stand Goal: LTG:  Patient will perform sit to stand in prep for activites of daily living with assistance level (OT) Description: LTG:  Patient will perform sit to stand in prep for activites of daily living with assistance level (OT) Flowsheets (Taken 04/26/2023 1253) LTG: PT will perform sit to stand in prep for activites of daily living with assistance level: (downgraded 3/11) Moderate Assistance - Patient 50 - 74%

## 2023-04-26 NOTE — Progress Notes (Signed)
 Physical Therapy Weekly Progress Note  Patient Details  Name: Rita Taylor MRN: [id number] Date of Birth: February 18, 1940  Beginning of progress report period: April 19, 2023 End of progress report period: April 26, 2023  Today's Date: 04/26/2023 PT Individual Time: 0832-0928 PT Individual Time Calculation (min): 56 min   Patient has met 0 of 3 short term goals.  Pt has made minimal to no progress towards functional goals. Pt requires max to total assist for bed mobility, transfers, and gait. Pt has been significantly limited by lethargy and poor attention in therapy sessions limiting pt participation with therapies. Pt has also been limited in recent days by bowel incontinence, unable to tolerate mobility without bowel movement. Pt will benefit from family conference to establish goals of care.  Patient continues to demonstrate the following deficits muscle weakness and muscle joint tightness, decreased cardiorespiratoy endurance, impaired timing and sequencing, abnormal tone, unbalanced muscle activation, motor apraxia, decreased coordination, and decreased motor planning, decreased visual acuity and decreased visual perceptual skills, decreased midline orientation, decreased attention to left, and decreased attention to right, decreased initiation, decreased attention, decreased awareness, decreased problem solving, decreased safety awareness, decreased memory, and delayed processing, and decreased sitting balance, decreased standing balance, decreased postural control, and decreased balance strategies and therefore will continue to benefit from skilled PT intervention to increase functional independence with mobility.  Patient not progressing toward long term goals.  See goal revision..  Plan of care revisions: pt goals downgraded to max assist for mobility.  PT Short Term Goals Week 1:  PT Short Term Goal 1 (Week 1): Pt will complete bed mobility with min assist PT Short Term Goal 1 - Progress  (Week 1): Progressing toward goal PT Short Term Goal 2 (Week 1): Pt will complete transfers with mod assist and LRAD PT Short Term Goal 2 - Progress (Week 1): Progressing toward goal PT Short Term Goal 3 (Week 1): Pt will ambulate 30' with RW mod assist PT Short Term Goal 3 - Progress (Week 1): Progressing toward goal Week 2:  PT Short Term Goal 1 (Week 2): Pt will complete bed mobility with mod assist PT Short Term Goal 2 (Week 2): Pt will complete transfers with max assist PT Short Term Goal 3 (Week 2): Pt will ambulate with RW 10' with mod assist  Skilled Therapeutic Interventions/Progress Updates:  Pt presents in room in bed, asleep and lethargic throughout session but does respond with repetitive verbal cues. Session focused on bed mobility, transfer training, seated balance for participating with dressing, and standing tolerance for managing periarea hygiene. Pt continues to demonstrate leaky bowel and bowel incontinence with mobility this session. Pt dons pants in supine with max assist, able to elevated BLEs with cues and completes bridge with cues for donning pants. Pt completes bed mobility with max assist to sit EOB, requires mod assist initially for seated balance but progresses to supervision with increased time seated, remains seated for 5 minutes for dressing. Pt able to elevate BUEs for donning shirt seated EOB but requires max assist. Pt maintains eyes closed throughout however able to open R eye briefly with max cues. Pt requires total assist with transfer to [facility], pt demonstrating fearful behaviors and increased resistance to stand and transfer. Pt noted to be incontinent of bowel. Pt transported to bathroom via WC , attempt to use stedy for transfer to decrease fear with transfer as well as allow upright tolerance for periarea hygiene however pt with insufficient anterior wt shift and requires +2  assist for standing upright, significant fearful behaviors noted. Pt seated on toilet however  unable to void continently while seated. Pt requires total assist +2 to stand for periarea hygiene and brief management, signficant time to complete as pt attempt to sit frequently during periarea hygiene. Pt transferred back to [facility] via stedy +2 total assist. Pt noted to have 2nd bowel incontinent episode once seated in WC. Pt transported back to bed via WC, completes total assist transfer back to bed, requires total assist for sit to supine as pt unable to follow verbal commands. Pt remains supine in bed with nursing staff present for hygiene at end of session.  Ambulation/gait training;Discharge planning;Functional mobility training;Therapeutic Activities;Psychosocial support;Disease management/prevention;Balance/vestibular training;Neuromuscular re-education;Therapeutic Exercise;Cognitive remediation/compensation;DME/adaptive equipment instruction;Pain management;UE/LE Strength taining/ROM;Community reintegration;Patient/family education;UE/LE Runner, broadcasting/film/video   Therapy Documentation Precautions:  Precautions Precautions: Cervical, Fall Precaution Booklet Issued: No Recall of Precautions/Restrictions: Impaired Precaution/Restrictions Comments: cervical collar on at all times Required Braces or Orthoses: Cervical Brace Cervical Brace: Hard collar Restrictions Weight Bearing Restrictions Per Provider Order: No  Therapy/Group: Individual Therapy  Edwin Cap PT, DPT 04/26/2023, 9:30 AM

## 2023-04-26 NOTE — Patient Care Conference (Signed)
 Inpatient RehabilitationTeam Conference and Plan of Care Update Date: 04/26/2023   Time: 1044 am     Patient Name: Rita Taylor      Medical Record Number: [id number]  Date of Birth: Dec 29, 1940 Sex: Female         Room/Bed: 4M08C/4M08C-01 Payor Info: Payor: MEDICARE / Plan: MEDICARE PART A AND B / Product Type: *No Product type* /    Admit Date/Time:  04/18/2023 12:31 PM  Primary Diagnosis:  Obstructive hydrocephalus [facility])  Hospital Problems: Principal Problem:   Obstructive hydrocephalus (HCC) Active Problems:   Hyponatremia   ABLA (acute blood loss anemia)   Prediabetes   Delirium   Severe malnutrition [facility])    Expected Discharge Date: Expected Discharge Date: 05/10/23  Team Members Present: Physician leading conference: Dr. Elijah Birk Social Worker Present: Cecile Sheerer, LCSWA Nurse Present: Konrad Dolores, RN PT Present: Darrold Span, PT OT Present: Limmie Patricia, OT SLP Present: Pablo Lawrence, SLP PPS Coordinator present : Fae Pippin, SLP     Current Status/Progress Goal Weekly Team Focus  Bowel/Bladder   incontinent of bowel and bladder   to be continent   bowel and bladder training    Swallow/Nutrition/ Hydration   NPO- recommending D1 and nectar   min a  trials of ice chips, thin, nectar, D1    ADL's   Max A for washing face/UB bathing/LB dressing, Mod A UB dressing, Total A LB bathing. Mod-max A stand pivot transfer.   Min A with follow up HHOT   Activity tolerance/endurance, increasing participation in therapy session. decrease posterior lean during standing    Mobility   total assist bed mobility and stand pivot transfers, max assist sit<>stand RW   downgraded to min/modA goals  barriers: fatigue, lethargy, inattention, bowel incontinence; focus on transfer training, gait training, upright tolerance    Communication                Safety/Cognition/ Behavioral Observations  max A   mod A   use of compensatory strategies,  internal/ external aids, improved endurance/ tolerance, basic problem solving    Pain   no complaints of pain   remain pain free   turning and positioning every 2 hours    Skin   healing   free from infection  wound and skin care      Discharge Planning:  Pt will discharge to home with private caregivers that will assist during the day,and children will provide support at night. SW will confirm there are no barriers to discharge.   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  Patient on target to meet rehab goals: no, patient requires total assistance with ADLs. Patient requires max assist with stand pivot transfers.Goals at discharge are set for min assist.    *See Care Plan and progress notes for long and short-term goals.   Revisions to Treatment Plan:  Dietary consult Palliative consult Family conference 04/28/23  at 10 am Swallow study 04/29/23  Teaching Needs: Safety, dietary recommendations, toileting, transfers, medications, etc.   Current Barriers to Discharge: Decreased caregiver support, Incontinence, and Nutritional means  Possible Resolutions to Barriers: Family education     Medical Summary Current Status: Medically complicated by cervical fracture, VP shunt for hydrocephalus, ongoing cognitive deficits, incontinence of bowel and bladder, severe protein calorie malnutrition on tube feeds, poor pain control, intermittent hypertension  Barriers to Discharge: Behavior/Mood;Medical stability;Incontinence;Inadequate Nutritional Intake;Self-care education;Uncontrolled Hypertension;Uncontrolled Pain   Possible Resolutions to [employer]: Starting neurostimulant to improve daytime alertness, tube feeds for nutrition with encouragement of  p.o. intakes and discussion of PEG tube with family this week, monitor vitals, continue timed toileting and addition of fiber for ongoing incontinence   Continued Need for Acute Rehabilitation Level of Care: The patient requires daily medical management by a physician with specialized training in physical medicine and rehabilitation for the following reasons: Direction of a multidisciplinary physical rehabilitation program to maximize functional independence : Yes Medical management of patient stability for increased activity during participation in an intensive rehabilitation regime.: Yes Analysis of laboratory values and/or radiology reports with any subsequent need for medication adjustment and/or medical intervention. : Yes   I attest that I was present, lead the team conference, and concur with the assessment and plan of the team.   Gwenyth Allegra 04/26/2023, 3:53 PM

## 2023-04-27 ENCOUNTER — Other Ambulatory Visit: Payer: Self-pay

## 2023-04-27 ENCOUNTER — Encounter (HOSPITAL_COMMUNITY): Payer: Self-pay | Admitting: Internal Medicine

## 2023-04-27 ENCOUNTER — Inpatient Hospital Stay (HOSPITAL_COMMUNITY)
Admission: AD | Admit: 2023-04-27 | Discharge: 2023-06-09 | DRG: 371 | Disposition: A | Discharge status: Skilled Nursing Facility | Source: Ambulatory Visit | Attending: Internal Medicine | Admitting: Internal Medicine

## 2023-04-27 ENCOUNTER — Inpatient Hospital Stay (HOSPITAL_COMMUNITY)

## 2023-04-27 ENCOUNTER — Encounter (HOSPITAL_COMMUNITY): Payer: Self-pay

## 2023-04-27 DIAGNOSIS — R29810 Facial weakness: Secondary | ICD-10-CM | POA: Diagnosis present

## 2023-04-27 DIAGNOSIS — E11649 Type 2 diabetes mellitus with hypoglycemia without coma: Secondary | ICD-10-CM | POA: Diagnosis present

## 2023-04-27 DIAGNOSIS — G936 Cerebral edema: Secondary | ICD-10-CM

## 2023-04-27 DIAGNOSIS — D72829 Elevated white blood cell count, unspecified: Secondary | ICD-10-CM | POA: Diagnosis present

## 2023-04-27 DIAGNOSIS — E785 Hyperlipidemia, unspecified: Secondary | ICD-10-CM | POA: Diagnosis present

## 2023-04-27 DIAGNOSIS — Z8619 Personal history of other infectious and parasitic diseases: Secondary | ICD-10-CM

## 2023-04-27 DIAGNOSIS — R7303 Prediabetes: Secondary | ICD-10-CM | POA: Diagnosis not present

## 2023-04-27 DIAGNOSIS — R2681 Unsteadiness on feet: Secondary | ICD-10-CM | POA: Diagnosis not present

## 2023-04-27 DIAGNOSIS — E43 Unspecified severe protein-calorie malnutrition: Secondary | ICD-10-CM | POA: Diagnosis not present

## 2023-04-27 DIAGNOSIS — M48062 Spinal stenosis, lumbar region with neurogenic claudication: Secondary | ICD-10-CM | POA: Diagnosis not present

## 2023-04-27 DIAGNOSIS — A09 Infectious gastroenteritis and colitis, unspecified: Secondary | ICD-10-CM

## 2023-04-27 DIAGNOSIS — R627 Adult failure to thrive: Secondary | ICD-10-CM | POA: Diagnosis present

## 2023-04-27 DIAGNOSIS — S129XXD Fracture of neck, unspecified, subsequent encounter: Secondary | ICD-10-CM | POA: Diagnosis not present

## 2023-04-27 DIAGNOSIS — D649 Anemia, unspecified: Secondary | ICD-10-CM | POA: Diagnosis present

## 2023-04-27 DIAGNOSIS — R64 Cachexia: Secondary | ICD-10-CM | POA: Diagnosis not present

## 2023-04-27 DIAGNOSIS — G911 Obstructive hydrocephalus: Secondary | ICD-10-CM | POA: Diagnosis present

## 2023-04-27 DIAGNOSIS — I10 Essential (primary) hypertension: Secondary | ICD-10-CM | POA: Diagnosis present

## 2023-04-27 DIAGNOSIS — Z85820 Personal history of malignant melanoma of skin: Secondary | ICD-10-CM | POA: Diagnosis not present

## 2023-04-27 DIAGNOSIS — Z9689 Presence of other specified functional implants: Secondary | ICD-10-CM | POA: Diagnosis present

## 2023-04-27 DIAGNOSIS — R41841 Cognitive communication deficit: Secondary | ICD-10-CM | POA: Diagnosis not present

## 2023-04-27 DIAGNOSIS — G510 Bell's palsy: Secondary | ICD-10-CM | POA: Diagnosis not present

## 2023-04-27 DIAGNOSIS — E119 Type 2 diabetes mellitus without complications: Secondary | ICD-10-CM

## 2023-04-27 DIAGNOSIS — S12000A Unspecified displaced fracture of first cervical vertebra, initial encounter for closed fracture: Secondary | ICD-10-CM | POA: Diagnosis not present

## 2023-04-27 DIAGNOSIS — R4701 Aphasia: Secondary | ICD-10-CM | POA: Diagnosis not present

## 2023-04-27 DIAGNOSIS — M21372 Foot drop, left foot: Secondary | ICD-10-CM | POA: Diagnosis present

## 2023-04-27 DIAGNOSIS — E860 Dehydration: Secondary | ICD-10-CM | POA: Diagnosis present

## 2023-04-27 DIAGNOSIS — A0472 Enterocolitis due to Clostridium difficile, not specified as recurrent: Secondary | ICD-10-CM | POA: Diagnosis not present

## 2023-04-27 DIAGNOSIS — Z83438 Family history of other disorder of lipoprotein metabolism and other lipidemia: Secondary | ICD-10-CM

## 2023-04-27 DIAGNOSIS — Z8249 Family history of ischemic heart disease and other diseases of the circulatory system: Secondary | ICD-10-CM

## 2023-04-27 DIAGNOSIS — E8809 Other disorders of plasma-protein metabolism, not elsewhere classified: Secondary | ICD-10-CM | POA: Diagnosis present

## 2023-04-27 DIAGNOSIS — J9811 Atelectasis: Secondary | ICD-10-CM | POA: Diagnosis not present

## 2023-04-27 DIAGNOSIS — F331 Major depressive disorder, recurrent, moderate: Secondary | ICD-10-CM | POA: Diagnosis not present

## 2023-04-27 DIAGNOSIS — Z681 Body mass index (BMI) 19 or less, adult: Secondary | ICD-10-CM

## 2023-04-27 DIAGNOSIS — Z79899 Other long term (current) drug therapy: Secondary | ICD-10-CM | POA: Diagnosis not present

## 2023-04-27 DIAGNOSIS — R4182 Altered mental status, unspecified: Secondary | ICD-10-CM | POA: Diagnosis not present

## 2023-04-27 DIAGNOSIS — I700 Atherosclerosis of aorta: Secondary | ICD-10-CM | POA: Diagnosis not present

## 2023-04-27 DIAGNOSIS — F05 Delirium due to known physiological condition: Secondary | ICD-10-CM | POA: Diagnosis present

## 2023-04-27 DIAGNOSIS — F39 Unspecified mood [affective] disorder: Secondary | ICD-10-CM | POA: Diagnosis not present

## 2023-04-27 DIAGNOSIS — M6259 Muscle wasting and atrophy, not elsewhere classified, multiple sites: Secondary | ICD-10-CM | POA: Diagnosis not present

## 2023-04-27 DIAGNOSIS — G9341 Metabolic encephalopathy: Secondary | ICD-10-CM | POA: Diagnosis present

## 2023-04-27 DIAGNOSIS — R54 Age-related physical debility: Secondary | ICD-10-CM | POA: Diagnosis present

## 2023-04-27 DIAGNOSIS — R000 Tachycardia, unspecified: Secondary | ICD-10-CM | POA: Diagnosis present

## 2023-04-27 DIAGNOSIS — Z794 Long term (current) use of insulin: Secondary | ICD-10-CM

## 2023-04-27 DIAGNOSIS — Z7401 Bed confinement status: Secondary | ICD-10-CM | POA: Diagnosis not present

## 2023-04-27 DIAGNOSIS — F32A Depression, unspecified: Secondary | ICD-10-CM | POA: Diagnosis present

## 2023-04-27 DIAGNOSIS — Z4682 Encounter for fitting and adjustment of non-vascular catheter: Secondary | ICD-10-CM | POA: Diagnosis not present

## 2023-04-27 DIAGNOSIS — R404 Transient alteration of awareness: Secondary | ICD-10-CM | POA: Diagnosis not present

## 2023-04-27 DIAGNOSIS — R6510 Systemic inflammatory response syndrome (SIRS) of non-infectious origin without acute organ dysfunction: Principal | ICD-10-CM | POA: Diagnosis present

## 2023-04-27 DIAGNOSIS — Z7982 Long term (current) use of aspirin: Secondary | ICD-10-CM

## 2023-04-27 DIAGNOSIS — Z751 Person awaiting admission to adequate facility elsewhere: Secondary | ICD-10-CM

## 2023-04-27 DIAGNOSIS — D62 Acute posthemorrhagic anemia: Secondary | ICD-10-CM | POA: Diagnosis present

## 2023-04-27 DIAGNOSIS — R1310 Dysphagia, unspecified: Secondary | ICD-10-CM

## 2023-04-27 DIAGNOSIS — Z515 Encounter for palliative care: Secondary | ICD-10-CM | POA: Diagnosis not present

## 2023-04-27 DIAGNOSIS — I2510 Atherosclerotic heart disease of native coronary artery without angina pectoris: Secondary | ICD-10-CM | POA: Diagnosis not present

## 2023-04-27 DIAGNOSIS — S129XXA Fracture of neck, unspecified, initial encounter: Secondary | ICD-10-CM | POA: Diagnosis present

## 2023-04-27 DIAGNOSIS — M6281 Muscle weakness (generalized): Secondary | ICD-10-CM | POA: Diagnosis present

## 2023-04-27 DIAGNOSIS — Q8502 Neurofibromatosis, type 2: Secondary | ICD-10-CM | POA: Diagnosis not present

## 2023-04-27 DIAGNOSIS — Z431 Encounter for attention to gastrostomy: Secondary | ICD-10-CM | POA: Diagnosis not present

## 2023-04-27 DIAGNOSIS — R1312 Dysphagia, oropharyngeal phase: Secondary | ICD-10-CM | POA: Diagnosis not present

## 2023-04-27 DIAGNOSIS — R410 Disorientation, unspecified: Secondary | ICD-10-CM | POA: Diagnosis not present

## 2023-04-27 DIAGNOSIS — Z7189 Other specified counseling: Secondary | ICD-10-CM | POA: Diagnosis not present

## 2023-04-27 DIAGNOSIS — S069X0S Unspecified intracranial injury without loss of consciousness, sequela: Secondary | ICD-10-CM

## 2023-04-27 DIAGNOSIS — M810 Age-related osteoporosis without current pathological fracture: Secondary | ICD-10-CM | POA: Diagnosis not present

## 2023-04-27 DIAGNOSIS — Z982 Presence of cerebrospinal fluid drainage device: Secondary | ICD-10-CM | POA: Diagnosis not present

## 2023-04-27 DIAGNOSIS — D333 Benign neoplasm of cranial nerves: Secondary | ICD-10-CM | POA: Diagnosis present

## 2023-04-27 DIAGNOSIS — R2689 Other abnormalities of gait and mobility: Secondary | ICD-10-CM | POA: Diagnosis not present

## 2023-04-27 DIAGNOSIS — I951 Orthostatic hypotension: Secondary | ICD-10-CM | POA: Diagnosis present

## 2023-04-27 DIAGNOSIS — Z7409 Other reduced mobility: Secondary | ICD-10-CM | POA: Diagnosis present

## 2023-04-27 DIAGNOSIS — Z981 Arthrodesis status: Secondary | ICD-10-CM

## 2023-04-27 LAB — CBC WITH DIFFERENTIAL/PLATELET
Abs Immature Granulocytes: 0.1 10*3/uL — ABNORMAL HIGH (ref 0.00–0.07)
Basophils Absolute: 0.1 10*3/uL (ref 0.0–0.1)
Basophils Relative: 1 %
Eosinophils Absolute: 0.2 10*3/uL (ref 0.0–0.5)
Eosinophils Relative: 1 %
HCT: 30.2 % — ABNORMAL LOW (ref 36.0–46.0)
Hemoglobin: 9.8 g/dL — ABNORMAL LOW (ref 12.0–15.0)
Immature Granulocytes: 1 %
Lymphocytes Relative: 23 %
Lymphs Abs: 3.2 10*3/uL (ref 0.7–4.0)
MCH: 29.5 pg (ref 26.0–34.0)
MCHC: 32.5 g/dL (ref 30.0–36.0)
MCV: 91 fL (ref 80.0–100.0)
Monocytes Absolute: 0.8 10*3/uL (ref 0.1–1.0)
Monocytes Relative: 6 %
Neutro Abs: 9.5 10*3/uL — ABNORMAL HIGH (ref 1.7–7.7)
Neutrophils Relative %: 68 %
Platelets: 498 10*3/uL — ABNORMAL HIGH (ref 150–400)
RBC: 3.32 MIL/uL — ABNORMAL LOW (ref 3.87–5.11)
RDW: 15 % (ref 11.5–15.5)
WBC: 13.9 10*3/uL — ABNORMAL HIGH (ref 4.0–10.5)
nRBC: 0 % (ref 0.0–0.2)

## 2023-04-27 LAB — GLUCOSE, CAPILLARY
Glucose-Capillary: 103 mg/dL — ABNORMAL HIGH (ref 70–99)
Glucose-Capillary: 108 mg/dL — ABNORMAL HIGH (ref 70–99)
Glucose-Capillary: 112 mg/dL — ABNORMAL HIGH (ref 70–99)
Glucose-Capillary: 116 mg/dL — ABNORMAL HIGH (ref 70–99)
Glucose-Capillary: 79 mg/dL (ref 70–99)

## 2023-04-27 LAB — BASIC METABOLIC PANEL
Anion gap: 10 (ref 5–15)
BUN: 19 mg/dL (ref 8–23)
CO2: 22 mmol/L (ref 22–32)
Calcium: 8.6 mg/dL — ABNORMAL LOW (ref 8.9–10.3)
Chloride: 106 mmol/L (ref 98–111)
Creatinine, Ser: 0.83 mg/dL (ref 0.44–1.00)
GFR, Estimated: >60 mL/min (ref >60)
Glucose, Bld: 145 mg/dL — ABNORMAL HIGH (ref 70–99)
Potassium: 4.4 mmol/L (ref 3.5–5.1)
Sodium: 138 mmol/L (ref 135–145)

## 2023-04-27 LAB — C DIFFICILE (CDIFF) QUICK SCRN (NO PCR REFLEX)
C Diff antigen: POSITIVE — AB
C Diff toxin: NEGATIVE

## 2023-04-27 LAB — C-REACTIVE PROTEIN: CRP: 0.5 mg/dL (ref <1.0)

## 2023-04-27 LAB — LACTIC ACID, PLASMA: Lactic Acid, Venous: 1.7 mmol/L (ref 0.5–1.9)

## 2023-04-27 MED ORDER — FAMOTIDINE 20 MG PO TABS
20.0000 mg | ORAL_TABLET | Freq: Every day | ORAL | Status: DC
  Administered 2023-04-27 – 2023-05-09 (×13): 20 mg
  Filled 2023-04-27 (×13): qty 1

## 2023-04-27 MED ORDER — METOPROLOL TARTRATE 50 MG PO TABS
50.0000 mg | ORAL_TABLET | Freq: Two times a day (BID) | ORAL | Status: DC
  Administered 2023-04-27 – 2023-05-01 (×9): 50 mg
  Filled 2023-04-27 (×9): qty 1

## 2023-04-27 MED ORDER — ENOXAPARIN SODIUM 30 MG/0.3ML IJ SOSY
30.0000 mg | PREFILLED_SYRINGE | INTRAMUSCULAR | Status: DC
  Administered 2023-04-27 – 2023-06-08 (×41): 30 mg via SUBCUTANEOUS
  Filled 2023-04-27 (×42): qty 0.3

## 2023-04-27 MED ORDER — ASPIRIN 81 MG PO CHEW
81.0000 mg | CHEWABLE_TABLET | Freq: Every day | ORAL | Status: DC
  Administered 2023-04-27 – 2023-05-09 (×13): 81 mg
  Filled 2023-04-27 (×13): qty 1

## 2023-04-27 MED ORDER — ASPIRIN 81 MG PO TBEC: 81.0000 mg | DELAYED_RELEASE_TABLET | Freq: Every day | ORAL | Status: DC

## 2023-04-27 MED ORDER — BANATROL TF EN LIQD
60.0000 mL | Freq: Four times a day (QID) | ENTERAL | Status: DC
  Administered 2023-04-27 (×2): 60 mL
  Filled 2023-04-27 (×3): qty 60

## 2023-04-27 MED ORDER — PRAVASTATIN SODIUM 40 MG PO TABS
40.0000 mg | ORAL_TABLET | Freq: Every day | ORAL | Status: DC
  Administered 2023-04-27 – 2023-05-09 (×13): 40 mg
  Filled 2023-04-27 (×13): qty 1

## 2023-04-27 MED ORDER — INSULIN ASPART 100 UNIT/ML IJ SOLN
0.0000 [IU] | INTRAMUSCULAR | Status: DC
  Administered 2023-04-28: 2 [IU] via SUBCUTANEOUS
  Administered 2023-04-28 – 2023-05-01 (×9): 1 [IU] via SUBCUTANEOUS
  Administered 2023-05-02: 2 [IU] via SUBCUTANEOUS
  Administered 2023-05-02 – 2023-05-03 (×2): 1 [IU] via SUBCUTANEOUS
  Administered 2023-05-03: 2 [IU] via SUBCUTANEOUS
  Administered 2023-05-04: 1 [IU] via SUBCUTANEOUS
  Administered 2023-05-04: 2 [IU] via SUBCUTANEOUS
  Administered 2023-05-04 (×2): 1 [IU] via SUBCUTANEOUS
  Administered 2023-05-05 (×2): 2 [IU] via SUBCUTANEOUS
  Administered 2023-05-05 – 2023-05-06 (×7): 1 [IU] via SUBCUTANEOUS
  Administered 2023-05-07: 2 [IU] via SUBCUTANEOUS
  Administered 2023-05-07 – 2023-05-08 (×7): 1 [IU] via SUBCUTANEOUS
  Administered 2023-05-08 – 2023-05-09 (×2): 2 [IU] via SUBCUTANEOUS
  Administered 2023-05-09 (×2): 1 [IU] via SUBCUTANEOUS
  Administered 2023-05-10 (×2): 2 [IU] via SUBCUTANEOUS
  Administered 2023-05-10 – 2023-05-11 (×5): 1 [IU] via SUBCUTANEOUS
  Administered 2023-05-11: 2 [IU] via SUBCUTANEOUS
  Administered 2023-05-11 – 2023-05-13 (×6): 1 [IU] via SUBCUTANEOUS
  Administered 2023-05-13: 2 [IU] via SUBCUTANEOUS
  Administered 2023-05-13 – 2023-05-14 (×2): 1 [IU] via SUBCUTANEOUS
  Administered 2023-05-15: 2 [IU] via SUBCUTANEOUS
  Administered 2023-05-15 (×2): 1 [IU] via SUBCUTANEOUS
  Administered 2023-05-16 (×2): 2 [IU] via SUBCUTANEOUS
  Administered 2023-05-16 (×2): 1 [IU] via SUBCUTANEOUS
  Administered 2023-05-16: 2 [IU] via SUBCUTANEOUS
  Administered 2023-05-17: 1 [IU] via SUBCUTANEOUS
  Administered 2023-05-18: 2 [IU] via SUBCUTANEOUS
  Administered 2023-05-19: 1 [IU] via SUBCUTANEOUS
  Administered 2023-05-19 (×2): 2 [IU] via SUBCUTANEOUS
  Administered 2023-05-20 (×2): 1 [IU] via SUBCUTANEOUS
  Administered 2023-05-21 – 2023-05-22 (×4): 2 [IU] via SUBCUTANEOUS
  Administered 2023-05-22 – 2023-05-23 (×3): 1 [IU] via SUBCUTANEOUS
  Administered 2023-05-23: 2 [IU] via SUBCUTANEOUS

## 2023-04-27 MED ORDER — BUPROPION HCL ER (XL) 150 MG PO TB24: 150.0000 mg | ORAL_TABLET | Freq: Every day | ORAL | Status: DC

## 2023-04-27 MED ORDER — MELATONIN 3 MG PO TABS
3.0000 mg | ORAL_TABLET | Freq: Every day | ORAL | Status: DC
  Administered 2023-04-27 – 2023-05-04 (×8): 3 mg
  Filled 2023-04-27 (×8): qty 1

## 2023-04-27 MED ORDER — PIPERACILLIN-TAZOBACTAM 3.375 G IVPB
3.3750 g | Freq: Three times a day (TID) | INTRAVENOUS | Status: DC
  Administered 2023-04-27: 3.375 g via INTRAVENOUS
  Filled 2023-04-27 (×3): qty 50

## 2023-04-27 MED ORDER — SODIUM CHLORIDE 1 G PO TABS
1.0000 g | ORAL_TABLET | Freq: Two times a day (BID) | ORAL | Status: DC
  Administered 2023-04-27 – 2023-05-09 (×24): 1 g
  Filled 2023-04-27 (×24): qty 1

## 2023-04-27 MED ORDER — OXYCODONE HCL 5 MG PO TABS: 2.5000 mg | ORAL_TABLET | ORAL | Status: DC | PRN

## 2023-04-27 MED ORDER — BUPROPION HCL 75 MG PO TABS
75.0000 mg | ORAL_TABLET | Freq: Two times a day (BID) | ORAL | Status: DC
  Administered 2023-04-27 – 2023-05-05 (×16): 75 mg
  Filled 2023-04-27 (×16): qty 1

## 2023-04-27 MED ORDER — VITAMIN B-12 1000 MCG PO TABS: 1000.0000 ug | ORAL_TABLET | Freq: Every day | ORAL | Status: DC

## 2023-04-27 MED ORDER — FREE WATER
200.0000 mL | Freq: Four times a day (QID) | Status: DC
  Administered 2023-04-27 – 2023-05-03 (×24): 200 mL

## 2023-04-27 MED ORDER — BANATROL TF EN LIQD
60.0000 mL | Freq: Four times a day (QID) | ENTERAL | Status: DC
  Administered 2023-04-27 – 2023-04-28 (×6): 60 mL
  Filled 2023-04-27 (×7): qty 60

## 2023-04-27 MED ORDER — METHYLPHENIDATE HCL 5 MG PO TABS
10.0000 mg | ORAL_TABLET | Freq: Two times a day (BID) | ORAL | Status: DC
Start: 2023-04-28 — End: 2023-05-05
  Administered 2023-04-28 – 2023-05-05 (×16): 10 mg
  Filled 2023-04-27 (×15): qty 2

## 2023-04-27 MED ORDER — ACETAMINOPHEN 500 MG PO TABS
1000.0000 mg | ORAL_TABLET | Freq: Three times a day (TID) | ORAL | Status: DC
  Administered 2023-04-27 – 2023-05-09 (×37): 1000 mg
  Filled 2023-04-27 (×37): qty 2

## 2023-04-27 MED ORDER — LOSARTAN POTASSIUM 50 MG PO TABS
50.0000 mg | ORAL_TABLET | Freq: Every day | ORAL | Status: DC
  Administered 2023-04-27: 50 mg
  Filled 2023-04-27: qty 1

## 2023-04-27 MED ORDER — OXYCODONE HCL 5 MG PO TABS
2.5000 mg | ORAL_TABLET | ORAL | Status: DC | PRN
  Administered 2023-04-28 – 2023-05-01 (×3): 2.5 mg
  Filled 2023-04-27 (×3): qty 1

## 2023-04-27 MED ORDER — VITAMIN B-12 1000 MCG PO TABS
1000.0000 ug | ORAL_TABLET | Freq: Every day | ORAL | Status: DC
  Administered 2023-04-27 – 2023-05-09 (×13): 1000 ug
  Filled 2023-04-27 (×13): qty 1

## 2023-04-27 MED ORDER — PRAVASTATIN SODIUM 40 MG PO TABS: 40.0000 mg | ORAL_TABLET | Freq: Every day | ORAL | Status: DC

## 2023-04-27 MED ORDER — METOPROLOL TARTRATE 50 MG PO TABS: 50.0000 mg | ORAL_TABLET | Freq: Two times a day (BID) | ORAL | Status: DC

## 2023-04-27 MED ORDER — ENOXAPARIN SODIUM 40 MG/0.4ML IJ SOSY: 40.0000 mg | PREFILLED_SYRINGE | INTRAMUSCULAR | Status: DC

## 2023-04-27 MED ORDER — POLYVINYL ALCOHOL 1.4 % OP SOLN
1.0000 [drp] | OPHTHALMIC | Status: DC | PRN
  Administered 2023-04-28 (×2): 1 [drp] via OPHTHALMIC
  Filled 2023-04-27 (×2): qty 15

## 2023-04-27 MED ORDER — ACETAMINOPHEN 325 MG PO TABS
650.0000 mg | ORAL_TABLET | Freq: Four times a day (QID) | ORAL | Status: DC | PRN
  Administered 2023-06-03: 650 mg via ORAL
  Filled 2023-04-27: qty 2

## 2023-04-27 MED ORDER — ACETAMINOPHEN 650 MG RE SUPP: 650.0000 mg | Freq: Four times a day (QID) | RECTAL | Status: DC | PRN

## 2023-04-27 MED ORDER — OYSTER SHELL CALCIUM/D3 500-5 MG-MCG PO TABS
1.0000 | ORAL_TABLET | Freq: Three times a day (TID) | ORAL | Status: DC
  Administered 2023-04-27 – 2023-05-09 (×37): 1
  Filled 2023-04-27 (×41): qty 1

## 2023-04-27 MED ORDER — POLYETHYLENE GLYCOL 3350 17 G PO PACK: 17.0000 g | PACK | Freq: Every day | ORAL | Status: DC | PRN

## 2023-04-27 MED ORDER — MIRTAZAPINE 15 MG PO TABS
7.5000 mg | ORAL_TABLET | Freq: Every day | ORAL | Status: DC
  Administered 2023-04-27 – 2023-05-08 (×12): 7.5 mg
  Filled 2023-04-27 (×12): qty 1

## 2023-04-27 MED ORDER — LOSARTAN POTASSIUM 50 MG PO TABS: 50.0000 mg | ORAL_TABLET | Freq: Every day | ORAL | Status: DC

## 2023-04-27 NOTE — Progress Notes (Signed)
 Inpatient Rehabilitation Care Coordinator Discharge Note   Patient Details  Name: Rita Taylor MRN: [id number] Date of Birth: [birth year]   Discharge location: D/c to acute due to medical reasons  Length of Stay: 9 days  Discharge activity level: Max A  Home/community participation: Limited  Patient response WU:JWJXBJ Literacy - How often do you need to have someone help you when you read instructions, pamphlets, or other written material from your doctor or pharmacy?: Rarely  Patient response YN:WGNFAO Isolation - How often do you feel lonely or isolated from those around you?: Patient unable to respond  Services provided included: RD, MD, PT, OT, SLP, CM, TR, Pharmacy, Neuropsych, SW, RN  Financial Services:  Field seismologist Utilized: Medicare    Choices offered to/list presented to: N/A  Follow-up services arranged:              Patient response to transportation need: Is the patient able to respond to transportation needs?: Yes In the past 12 months, has lack of transportation kept you from medical appointments or from getting medications?: No In the past 12 months, has lack of transportation kept you from meetings, work, or from getting things needed for daily living?: No   Patient/Family verbalized understanding of follow-up arrangements:  Yes  Individual responsible for coordination of the follow-up plan: contact pt son Selinda Orion  Confirmed correct DME delivered: Gretchen Short 04/27/2023    Comments (or additional information):Family was considering SNF prior to this medical incident. When discussing with family, would discuss possible issues with SNF due to [facility] and if there is an open claim. Per EMR, pt will not admit to CIR and other placement options should be considered.  Summary of Stay    Date/Time Discharge Planning CSW  04/26/23 1036 Pt will discharge to home with private caregivers that will assist during the day,and children will provide  support at night. SW will confirm there are no barriers to discharge. AAC  04/18/23 1341 Pt reports she will d/c to home with her husband with 24/7 caregivers.  SW will confirm there are no barriers to discharge. AAC       Deyonte Cadden A Lula Olszewski

## 2023-04-27 NOTE — Progress Notes (Signed)
 Patient ID: Rita Taylor, female   DOB: 04/25/1940, 83 y.o.   MRN: [id number]  Per attending, pt will discharge to acute and likely not returning to CIR.  Cecile Sheerer, MSW, LCSW Office: [phone] Cell: [phone] Fax: [phone]

## 2023-04-27 NOTE — Progress Notes (Signed)
 Occupational Therapy Session Note  Patient Details  Name: Rita Taylor MRN: [id number] Date of Birth: [birth year]  Today's Date: 04/27/2023 OT Individual Time: 4098-1191 OT Individual Time Calculation (min): 30 min  and Today's Date: 04/27/2023 OT Missed Time: 30 Minutes Missed Time Reason: Patient fatigue;Patient ill (comment);X-Ray   Short Term Goals: Week 1:  OT Short Term Goal 1 (Week 1): Pt will complete toileting with Max A with use of DME as needed. OT Short Term Goal 1 - Progress (Week 1): Progressing toward goal OT Short Term Goal 2 (Week 1): Pt will complete LB dressing and bathing with Max A OT Short Term Goal 2 - Progress (Week 1): Partly met OT Short Term Goal 3 (Week 1): Patient will complete UB bathing and dressing with Max A OT Short Term Goal 3 - Progress (Week 1): Met OT Short Term Goal 4 (Week 1): Pt will complete oral care with Max A. OT Short Term Goal 4 - Progress (Week 1): Progressing toward goal Week 2:  OT Short Term Goal 1 (Week 2): Pt will complete toileting with Max A with use of DME as needed. OT Short Term Goal 2 (Week 2): Pt will complete LB bathing with Max A OT Short Term Goal 3 (Week 2): Patient will complete UB bathing and dressing with Max A OT Short Term Goal 4 (Week 2): Pt will complete oral care with Max A.  Skilled Therapeutic Interventions/Progress Updates:    1:1 Pt very lethargic throughout session - keeping her eyes closed. Engaged pt in changing brief but every time she would be clean and roll to then participate in some kind of mobility to get to EOB pt would have another incontinent epsidoe of stool (liquid ) changed her 5 times in session. RN aware and came in to assist. Pt left resting in bed after cleaning her up and donning pants total A.  Therapy Documentation Precautions:  Precautions Precautions: Cervical, Fall Precaution Booklet Issued: No Recall of Precautions/Restrictions: Impaired Precaution/Restrictions Comments: cervical  collar on at all times Required Braces or Orthoses: Cervical Brace Cervical Brace: Hard collar Restrictions Weight Bearing Restrictions Per Provider Order: No General: General OT Amount of Missed Time: 30 Minutes; pt missed time was down in x ray and then had been incontient of stool 5 x in session with each movement pt also very lethargic   Pain: No c/o pain in session    Therapy/Group: Individual Therapy  Roney Mans [facility] 04/27/2023, 1:33 PM

## 2023-04-27 NOTE — Plan of Care (Signed)
  Problem: Clinical Measurements: Goal: Respiratory complications will improve Outcome: Progressing   Problem: Elimination: Goal: Will not experience complications related to urinary retention Outcome: Progressing   Problem: Pain Managment: Goal: General experience of comfort will improve and/or be controlled Outcome: Progressing   Problem: Safety: Goal: Ability to remain free from injury will improve Outcome: Progressing   Problem: Skin Integrity: Goal: Risk for impaired skin integrity will decrease Outcome: Progressing   Problem: Coping: Goal: Ability to adjust to condition or change in health will improve Outcome: Progressing   Problem: Fluid Volume: Goal: Ability to maintain a balanced intake and output will improve Outcome: Progressing   Problem: Metabolic: Goal: Ability to maintain appropriate glucose levels will improve Outcome: Progressing   Problem: Nutritional: Goal: Maintenance of adequate nutrition will improve Outcome: Progressing   Problem: Skin Integrity: Goal: Risk for impaired skin integrity will decrease Outcome: Progressing
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
## 2023-04-27 NOTE — Progress Notes (Signed)
 Inpatient Rehabilitation Discharge Medication Review by a Pharmacist  A complete drug regimen review was completed for this patient to identify any potential clinically significant medication issues.  High Risk Drug Classes Is patient taking? Indication by Medication  Antipsychotic Yes Compazine prn N/V  Anticoagulant Yes Lovenox - VTE ppx  Antibiotic Yes, as an intravenous medication IV Zosyn - ongoing pneumonia  Opioid No   Antiplatelet No   Hypoglycemics/insulin Yes Insulin - DM  Vasoactive Medication Yes Losartan, metoprolol- HTN  Chemotherapy No   Other Yes Bupropion, mirtazapine- mood Famotidine - reflux Methylphenidate - attention Pravastatin - HLD Methocarbamol - prn spasm     Type of Medication Issue Identified Description of Issue Recommendation(s)  Drug Interaction(s) (clinically significant)     Duplicate Therapy     Allergy     No Medication Administration End Date     Incorrect Dose     Additional Drug Therapy Needed     Significant med changes from prior encounter (inform family/care partners about these prior to discharge).    Other       Clinically significant medication issues were identified that warrant physician communication and completion of prescribed/recommended actions by midnight of the next day:  No  Name of provider notified for urgent issues identified:   Provider Method of Notification:     Pharmacist comments: Back to acute hospital  Time spent performing this drug regimen review (minutes):  20 minutes  Okey Regal, PharmD

## 2023-04-27 NOTE — Progress Notes (Signed)
 Physical Therapy Session Note  Patient Details  Name: Rita Taylor MRN: [id number] Date of Birth: 09-11-1940  Today's Date: 04/27/2023 PT Missed Time: 45 Minutes Missed Time Reason: MD hold (Comment)  Short Term Goals: Week 2:  PT Short Term Goal 1 (Week 2): Pt will complete bed mobility with mod assist PT Short Term Goal 2 (Week 2): Pt will complete transfers with max assist PT Short Term Goal 3 (Week 2): Pt will ambulate with RW 10' with mod assist  Skilled Therapeutic Interventions/Progress Updates:     Pt on hold due to decline in medical status. PT will follow up as able.   Therapy Documentation Precautions:  Precautions Precautions: Cervical, Fall Precaution Booklet Issued: No Recall of Precautions/Restrictions: Impaired Precaution/Restrictions Comments: cervical collar on at all times Required Braces or Orthoses: Cervical Brace Cervical Brace: Hard collar Restrictions Weight Bearing Restrictions Per Provider Order: No General: PT Amount of Missed Time (min): 45 Minutes PT Missed Treatment Reason: MD hold (Comment)   Therapy/Group: Individual Therapy  Beau Fanny, PT, DPT 04/27/2023, 3:22 PM

## 2023-04-27 NOTE — Progress Notes (Signed)
 Occupational Therapy Discharge Note  This patient was unable to complete the inpatient rehab program due to medical issues; therefore did not meet their long term goals. Pt left the program at a max  assist level for their  functional ADLs. This patient is being discharged from OT services at this time.  BIMS at time of d/c  Pt unable to complete due to medical status  See CareTool for functional status details.  If the patient is able to return to inpatient rehabilitation within 3 midnights, this may be considered an interrupted stay and therapy services will resume as ordered. Modification and reinstatement of their goals will be made upon completion of therapy service reevaluations.

## 2023-04-27 NOTE — Progress Notes (Signed)
 Physical Therapy Session Note  Patient Details  Name: Rita Taylor MRN: [id number] Date of Birth: 16-Apr-1940  Today's Date: 04/27/2023 PT Individual Time: 0805-0840 PT Individual Time Calculation (min): 35 min   Short Term Goals: Week 2:  PT Short Term Goal 1 (Week 2): Pt will complete bed mobility with mod assist PT Short Term Goal 2 (Week 2): Pt will complete transfers with max assist PT Short Term Goal 3 (Week 2): Pt will ambulate with RW 10' with mod assist  Skilled Therapeutic Interventions/Progress Updates:    Pt presents in room in bed, asleep, lethargic through session however agreeable to getting out of bed. Pt denies pain throughout session despite pt moaning and attempting to lie down while seated on EOB. Session focused on bed mobility and transfer training. Pt completes supine to sit with mod assist and max verbal cues, improved sequencing noted to sit EOB. Pt remains seated EOB with close supervision, min assist due to pt impulsively attempting to lie down while seated. Pt maintains seated for donning pants max assist with pt able to assist with pulling up pants at knee level. Pt completes sit to stand with max assist and significantly increased time due to poor attention to task. Therapist assist with pulling pants over hips with max assist. Pt noted to have incontinent bowel movement, returns to supine with min assist, demonstrating improved sequencing to return to supine. Pt requires +2 for boosting towards HOB and rolling bilaterally for periarea hygiene. Pt noted to have inflamed skin around rectum, RN notified. Pt remains supine with all needs within reach, call light in place, pillows positioned for comfort, and bed alarm activated at end of session. Pt missing 10 of 45 minute session due to bowel incontinence and lethargy.  Therapy Documentation Precautions:  Precautions Precautions: Cervical, Fall Precaution Booklet Issued: No Recall of Precautions/Restrictions:  Impaired Precaution/Restrictions Comments: cervical collar on at all times Required Braces or Orthoses: Cervical Brace Cervical Brace: Hard collar Restrictions Weight Bearing Restrictions Per Provider Order: No General: PT Amount of Missed Time (min): 10 Minutes PT Missed Treatment Reason: Other (Comment) (incontinent bowel, lethargy)   Therapy/Group: Individual Therapy  Edwin Cap PT, DPT 04/27/2023, 8:41 AM

## 2023-04-27 NOTE — Progress Notes (Signed)
  Inpatient Rehabilitation Admissions Coordinator   Patient admitted from CIR. Previous discussions with son on 3/11 with CIR SW. Son feels he is unable to provide care for her and her spouse. Case discussed with Dr Shearon Stalls. There are no plans to readmit to CIR. Recommend other rehab venues to be pursued when appropriate. Son and daughter are next of kin contacts.   Ottie Glazier, RN, MSN Rehab Admissions Coordinator [phone] 04/27/2023 5:56 PM

## 2023-04-27 NOTE — Progress Notes (Signed)
 SLP Cancellation Note  Patient Details Name: Rita Taylor MRN: [id number] DOB: [birth year]   Cancelled treatment:       Reason Eval/Treat Not Completed: Fatigue/lethargy limiting ability to participate- son at bedside. Pt was NPO with cortrak prior to D/C from CIR to acute care.  SLP will follow for readiness for clinical swallow assessment.   Zarion Oliff L. Samson Frederic, MA CCC/SLP Clinical Specialist - Acute Care SLP Acute Rehabilitation Services Office number [phone]    Blenda Mounts Laurice 04/27/2023, 5:13 PM
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
## 2023-04-27 NOTE — Progress Notes (Signed)
 Nutrition Follow-up  DOCUMENTATION CODES:   Severe malnutrition in context of chronic illness  INTERVENTION:  Continue TF via Cortrak: Osmolite 1.5 @ 60 ml/hr Tube feeding regimen provides 1800 kcal, 75 g protein (1200 ml/day)  FWF 200 ml every 6 hours   Per MD note monitor nutrition plan moving forward once son and pt finalize goals of care   NUTRITION DIAGNOSIS:   Severe Malnutrition related to chronic illness as evidenced by severe muscle depletion, severe fat depletion.  Still applicable  GOAL:   Patient will meet greater than or equal to 90% of their needs  Met through TF  MONITOR:   TF tolerance, PO intake  REASON FOR ASSESSMENT:   Consult Assessment of nutrition requirement/status  ASSESSMENT:   Pt with PMH of HTN, vestibular schwannoma s/p resection, diabetes, depression, left foot drop, neurofibromatosis type II, recent neck fracture (S/PE of surgery), who presents with brain tumor.  2/21: transfer to CIR at [facility] 2/26: Cortrak placed (gastric); txr to [facility], TF initiated  2/28- s/p Placement of Ventriculoperitoneal Shunt  3/3: Readmit to CIR 3/4: transitioned to nocturnal needs; cal count ordered 3/6: transitioned back to continuous feeds via cortrak due to very poor po intake 3/7: MBS; continue dys 1 and thin liquids through straw 3/10: advanced to nectar thick liquids; still on dys 1 3/12: NPO; pt being discharged to acute care for SIRS with leukocytosis  Palliative care consulted.  Pt continues to have very poor po intake with no progression. Pt has continued fluctuating mental status per PA note. Pt has hx of refusing meals, meds, and nutrition supplements throughout CIR admission and prior admission.  Visited with pt this AM who was not communicative. She was in bed holding her knees to her chest. Pt would not answer any nutrition related questions when asked about meal and supplement intake. Pt did report that she is drinking 2 Ensures a day,  however, question accuracy of pt's recall.  Pt was made NPO today given waxing and waning cognitive status.   Admit weight (3/3): 93.48 lbs Current weight: 98.33 lbs  Meds: calcium-vitamin D TID, Vitamin B12, Banatrol QID, pepcid, novolog, MVI, sodium chloride tablet, osmolite 1.5 60 ml/hr, dulcolax PRN  Labs: CBGs range from 110-132 in last 24 hours, A1C 5.6   Diet Order:   Diet Order             DIET - DYS 1 Room service appropriate? Yes; Fluid consistency: Nectar Thick  Diet effective now                   EDUCATION NEEDS:   Not appropriate for education at this time  Skin:  Skin Assessment: Reviewed RN Assessment  Last BM:  3/4-type 6  Height:   Ht Readings from Last 1 Encounters:  04/18/23 5\' 2"  (1.575 m)    Weight:   Wt Readings from Last 1 Encounters:  04/27/23 44.6 kg    Ideal Body Weight:  50 kg  BMI:  Body mass index is 17.98 kg/m.  Estimated Nutritional Needs:   Kcal:  1600-1800  Protein:  75-90 g  Fluid:  > 1.6 L    Maceo Pro, MS Dietetic Intern

## 2023-04-27 NOTE — Progress Notes (Signed)
 SLP Cancellation Note  Patient Details Name: Rita Taylor MRN: [id number] DOB: [birth year]   Cancelled treatment:     Pt scheduled for 60 min tx session targeting cognition and dysphagia. 60 mins missed, as staff was prepping to transfer pt to acute care.      Pati Gallo 04/27/2023, 4:17 PM

## 2023-04-27 NOTE — Progress Notes (Signed)
 PROGRESS NOTE   Subjective/Complaints:  Tachycardic into the 120s 1 time overnight, came down on its own.  Per nursing, patient was resting in bed at the time, no recent movement, no apparent distress.  Had denied pain or shortness of breath at that time.  Labs a.m. significant for uptrending leukocytosis of 13.9, cervical x-ray yesterday significant for right middle lobe pneumonia versus atelectasis.  Just finished 5 days of IV Zosyn for aspiration pneumonia.  Attempted to call son regarding nutritional plan moving forward, went to voicemail, left message to call back.  ROS: Unable to obtain ROS due to patient cognitive status.    Objective:   DG Cervical Spine 2 or 3 views Result Date: 04/26/2023 CLINICAL DATA:  Follow-up exam. EXAM: CERVICAL SPINE - 2-3 VIEW COMPARISON:  04/01/2023, 04/07/2023, 04/07/2023. FINDINGS: Previously described cervical spine fractures are not seen on limited images. No obvious prevertebral soft tissue swelling. Anterior cervical spinal fusion hardware is noted from C5-C6. Posterior cervical spinal fusion hardware is present from C4 through the upper thoracic spine. Alignment appears normal. Mild multilevel degenerative endplate changes and facet arthropathy are noted. An enteric tube is present. There suspected atelectasis or consolidation in the mid right lung. Shunt tubing is noted in the cervical soft tissues extending over the chest on the right. There is atherosclerotic calcification of the aorta. Rib fractures with bone callus formation are noted on the right. IMPRESSION: 1. Status post anterior and posterior cervical spinal fusion. The previously described fractures are not seen well radiographically. 2. Atelectasis or consolidation in the mid right lung. Electronically Signed   By: Thornell Sartorius M.D.   On: 04/26/2023 21:07    Recent Labs    04/25/23 0604 04/27/23 0528  WBC 12.1* 13.9*  HGB 10.3*  9.8*  HCT 32.4* 30.2*  PLT 543* 498*    Recent Labs    04/25/23 0604 04/27/23 0528  NA 134* 138  K 4.6 4.4  CL 107 106  CO2 19* 22  GLUCOSE 152* 145*  BUN 16 19  CREATININE 0.66 0.83  CALCIUM 8.2* 8.6*    Intake/Output Summary (Last 24 hours) at 04/27/2023 4782 Last data filed at 04/26/2023 1755 Gross per 24 hour  Intake 260 ml  Output --  Net 260 ml        Physical Exam: Vital Signs Blood pressure (!) 158/76, pulse (!) 107, temperature 98.2 F (36.8 C), temperature source Oral, resp. rate 16, height 5\' 2"  (1.575 m), weight 44.6 kg, SpO2 95%.   General: No apparent distress, Cachectic.  Lying in bed.  Less alert than prior exams.  HEENT: + staples over shunt site, oral mucosa dry + NGT Left eye tape/gauze --no discharge.  neck: Supple without JVD or lymphadenopathy. + [place] J collar. Heart: Reg rate and rhythm. No m/r/g appreciated. Chest: Mild crackles in left lower lobe, otherwise clear to auscultation bilaterally. + Implants. Abdomen: Soft, non-tender, non-distended, bowel sounds positive.  And normoactive. Extremities: No clubbing, cyanosis, or edema.  Psych: Flat, lethargic. Skin:  Anterior and posterior surgical incisions CDI Staples over cranial incisions  intact Peripheral IV CDI  Neuro:     Mental Status: AAO to person, not place or  time with cues .  Very delayed responses today, will squeeze hands with simple commands and open eyes but otherwise cannot answer other questions and quickly falls asleep on exam.  Speech/Languate: Delayed, appropriate single word responses  CRANIAL NERVES: EOMI R eye, decreased hearing L, L facial weakness-chronic   MOTOR: Moving all 4 extremities in bed, can provide resistance equally  SENSORY: Normal to touch all 4 extremities   Assessment/Plan: 1. Functional deficits which require 3+ hours per day of interdisciplinary therapy in a comprehensive inpatient rehab setting. Physiatrist is providing close team  supervision and 24 hour management of active medical problems listed below. Physiatrist and rehab team continue to assess barriers to discharge/monitor patient progress toward functional and medical goals  Care Tool:  Bathing    Body parts bathed by patient: Face, Abdomen   Body parts bathed by helper: Right arm, Left arm, Chest, Front perineal area, Buttocks, Right upper leg, Left upper leg, Right lower leg, Left lower leg     Bathing assist Assist Level: Total Assistance - Patient < 25%     Upper Body Dressing/Undressing Upper body dressing   What is the patient wearing?: Pull over shirt    Upper body assist Assist Level: Total Assistance - Patient < 25%    Lower Body Dressing/Undressing Lower body dressing      What is the patient wearing?: Incontinence brief, Pants     Lower body assist Assist for lower body dressing: Total Assistance - Patient < 25%     Toileting Toileting    Toileting assist Assist for toileting: Dependent - Patient 0%     Transfers Chair/bed transfer  Transfers assist  Chair/bed transfer activity did not occur: Safety/medical concerns (unsafe to get up)  Chair/bed transfer assist level: Moderate Assistance - Patient 50 - 74%     Locomotion Ambulation   Ambulation assist   Ambulation activity did not occur: Safety/medical concerns          Walk 10 feet activity   Assist  Walk 10 feet activity did not occur: Safety/medical concerns        Walk 50 feet activity   Assist Walk 50 feet with 2 turns activity did not occur: Safety/medical concerns         Walk 150 feet activity   Assist Walk 150 feet activity did not occur: Safety/medical concerns         Walk 10 feet on uneven surface  activity   Assist Walk 10 feet on uneven surfaces activity did not occur: Safety/medical concerns         Wheelchair     Assist Is the patient using a wheelchair?: Yes Type of Wheelchair: Manual    Wheelchair assist  level: Dependent - Patient 0%      Wheelchair 50 feet with 2 turns activity    Assist        Assist Level: Dependent - Patient 0%   Wheelchair 150 feet activity     Assist      Assist Level: Dependent - Patient 0%   Blood pressure (!) 158/76, pulse (!) 107, temperature 98.2 F (36.8 C), temperature source Oral, resp. rate 16, height 5\' 2"  (1.575 m), weight 44.6 kg, SpO2 95%.  Medical Problem List and Plan: 1. 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 Hasn't walked since last week. Downgraded goals to Mod A. OT stood EOB and participated in washing face and upper body; did Mod A with UBD dressing. SPT Mod-Max A. Back on Dys 1 and nectar thick diet. Alertness is limiting, certain to be aspirating due to inattention.    - Family meeting Thursday at 10 am--family later indicates that they are willing to pursue SNF, Thursday meeting canceled.  Will need to discuss transition to SNF plans including nutritional access and postsurgical follow-ups.  3/12: Hospitalist admitting to acute for SIRS with leukocytosis of unknown origin and general decline.  Called son and discussed this with him, answered all questions.  Did discuss that return to rehab is unlikely if pursuing SNF.  Briefly discussed need for nutritional access as core track cannot be managed by SNF.  He will consider and discuss this further after patient is admitted.--Palliative consult already pended.  2.  Antithrombotics: -DVT/anticoagulation:  Pharmaceutical: Lovenox 30mg  daily added             -antiplatelet therapy: ASA to start today.   3. Pain Management:  tylenol  prn -3/5: DC gabapentin, oxycodone due to no complaints and ongoing lethargy/delirium 3-7: Patient denying pain, but holding her head and bring her legs up to her chest. ?  Headache, ?  Chronic severe low back pain contributing to behaviors (on chart review, has been severe up until now).  Resume gabapentin 300 mg nightly, scheduled Tylenol 1000 mg 3 times daily. -04/24/23 seems to be doing better this weekend 3-10: More alert today, thoroughly denies pain, headaches.  DC gabapentin.   4. Mood/Behavior/Sleep:  LCSW to follow for evaluation and support.              -antipsychotic agents: N/A             -Continue Wellbutrin  - 3/7: Sleeping intermittently 7 to 8 hours per log, interrupted frequently.  See above. - 3/11: Start mirtazepine 7.5 mg at bedtime for appetite and mood  3-12: May be contributing to worsening cognition today, will discontinue on transition to acute.  5. Neuropsych/cognition: This patient is not fully capable of making decisions on her own behalf. -3-6: Cognition and attention remains poor, unsure if delirium, and TBI, or other contributing cause.  With worsening hyponatremia, will undergo endocrinologic workup as below.  Will consider stimulant if no improvement with resolution of hyponatremia. 3/7: Gabapentin 300 mg nightly for sleep and pain, scheduled Tylenol as above for pain, treat aspiration pneumonia as below.  If no improvement over the weekend, will initiate neurostimulant on Monday. --Addendum: Provided medical update to patient's son, requesting CT head for shunt evaluation given concern for patient headaches that she is not reporting and worsening cognition.  Feel this is a reasonable request, ordered routine CT head without contrast. -04/23/23 head CT last night with no hydrocephalus, fairly stable. Pt still fairly fatigued but arouseable.  -04/24/23 more awake today but still fairly fatigued-- neurostimulant might help 3-10: Start Ritalin 5 mg twice daily--did a  bit better yesterday 3-12, remains waxing and waning.  Per reports, best alertness was yesterday evening after increase to 10 mg twice daily.  6. Skin/Wound Care: Routine pressure   -Staple removal on 3/11-14--order placed 3-11   - 3/11: New fissure on perineum  per nursing; gerdhardt's cream  3-12: Frequent soiling of briefs with liquid, incontinent stool and worsening diaper rash; continue frequent barrier cream  7. Fluids/Electrolytes/Nutrition: Recheck CMET in am.             -See below  8. C6 Fx with instability/T3 burst Fx: Treated wit  ORIF and C4-T4 arthrodesis.              --C collar when out of bed.  -3-7: Patient tends to keep neck extended in bed, mouth open, which is contributing to throat dryness and worsening swallowing; will keep collar on in bed for now -Attempted to obtain better fitting collar, no small available in [place], pediatric did not fit. -3-11: Postop x-rays obtained per Dr. Marcell Barlow, hardware intact--continue cervical collar when out of bed.  9. HTN/Tachycardia: Will monitor BP/HR TID--continue Cozaar and lopressor 25mg  BID.             --monitor for orthostatic changes.   -Normotensive  -3-6: Reduce Cozaar to 50 mg daily due to hyponatremia.  Labs in AM. -3-7: Blood pressure slightly elevated, okay given age and hydrocephalus, monitor for now -3/8-10/25 BPs stable on slightly higher end, monitor 3-12: Tachycardia into the 120s overnight, mild at this a.m. but is meeting SIRS criteria.  Still mildly hypertensive, so we will leave medication adjustments to acute.  Vitals:   04/24/23 0338 04/24/23 1145 04/24/23 2019 04/24/23 2114  BP: (!) 155/67 (!) 166/76 (!) 144/68 (!) 144/68   04/25/23 0442 04/25/23 1305 04/25/23 2015 04/26/23 0445  BP: (!) 145/70 (!) 120/55 (!) 139/57 93/78   04/26/23 1251 04/26/23 2002 04/27/23 0530 04/27/23 0732  BP: (!) 131/59 (!) 148/63 (!) 148/74 (!) 158/76     10. Leucocytosis: Resolving--monitor for fevers or others signs of  infection. WBC 13.7 3/3    -Stable 3-4   - 3-12: Leukocytosis uptrending despite 5-day course of Zosyn; repeat chest x-ray given concern for recurrent pneumonia in the left middle lobe on cervical x-ray yesterday.  Will add blood cultures x 2, lactic acid, and CRP to labs today.   11. H/o Depression: Change Wellbutrin XL to 75 mg bid as cannot be crushed. -3-5: May benefit from addition of mirtazapine for appetite and depression; will see first if increase Megace helps. 3/6: Discussed wellbutrin with patient, feel risk of increased depression with removal is greater than possible contribution to SIADH 3-11: Mirtazapine as above--DC 3-12 due to increased lethargy  12. Dysphagia: On D2 diet with thin liquids via medicine cup--question provale cup?             --Will add full supervision and assist with meals. Has to be upright for meals. -3-7: Make n.p.o. due to aspiration pneumonia; swallow study done by SLP today, results pending.  Would keep n.p.o. for now--p.o. intakes have been minimal anyway. 3-10: Starting neurostimulant as above; doing well.  Will discuss with SLP with diet resumption today--agree with dysphagia 1 3-11: Per SLP, certain she is aspirating intermittently given waxing and waning cognitive status. 3-12: Discussed with SLP, agree with making patient n.p.o. given waxing and waning cognitive status and high likelihood of recurrent aspiration.  May benefit from [facility] protocol.  Continue tube feeds only for now.  13. Delirium: Will continue delirium precautions. Start sleep chart   - No sleep log overnight, labs today  -Sleeping overnight per reports.  -3-5: Limit deliriogenic medications, DC gabapentin, oxycodone. -3-6: Ongoing; will get cortisol, TSH, free T4, CMP, and CBC in a.m. Urinalysis today negative.  Will get chest x-ray given cough. -3/7: AM cortisol, thyroid studies normal; sodium improving.  WBC remains elevated, chest x-ray with aspiration pneumonia,  treating as below.  14. H/o 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with recurrence: Size stable per NS.  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 NA 132 on 3/3             --recheck Na level in am.--Stable 132.   3-5: Recheck sodium in AM. 3-6: Labs consistent with SIADH; Na 129.  is getting only 800 cc/day of water flushes through tube, no additional p.o. intakes.  Unable to adjust to saline flushes, so we will add salt tabs 1 mg twice daily through the tube, reduce losartan, and undergo endocrinologic workup with a.m. cortisol and thyroid studies. 3/7: Sodium improved to 131 today, continue salt tabs and treat pneumonia as below.  Daily labs over this weekend. -04/24/23 Na 135 now, doing well this weekend 3-10: NA 134, stable.  Continue current regimen. Repeat labs 3-12--stable  16. ABLA: Will monitor with serial checks.  HGB 9.6 3/3   - Stable 04/23/23 at 9.3 -- 9.7 on 04/24/23  17. Pre-diabetes: Hgb A1C- 6.0 a year ago. Will recheck in am. Monitor fasting BS for now.    -3-4: Add SSI overnight during tube feeds.--Well-controlled.  -doing well, monitor Recent Labs    04/26/23 1212 04/26/23 1624 04/27/23 0627  GLUCAP 113* 124* 116*     18. Severe malnutrition   --continue tube feeds at nights. Decrease to 65 cc/10 hours at nights --per friends patient did not eat much at baseline. Now on D2 diet with fatigue             --will ask nursing to save food slips. Continue Megace.   -3-3: Dietary initiating calorie count. - 3/5: Increase megace to BID.  If no improvement, will need to discuss possible PEG tube. -3-7: DC Megace, has not improved appetite at twice daily scheduling and could be affecting patient's cognition. 3-10: Resume p.o. diet; encourage as above 3/11: Start Mirtazepine 7.5 mg at bedtime as above; family has been reluctant with PEG tube in the past but patient will likely need tube feeds in the long-term and will need to transition to PEG if pursuing SNF; will  discuss with family in a.m. 3-12: N.p.o. as above, DC mirtazapine.  Reach topic of need for long-term nutritional access if considering SNF, son to the consider this as patient transitions to acute.  19..  Aspiration pneumonia  - seen on CXR 3/6 3/7: Discussed antibiotics with pharmacy, adequate for monotherapy with Zosyn 3.75 mg every 8 hours -N.p.o., head of bed elevated greater than 30 degrees for continuous tube feeds  - Omeprazole 20 mg BID via tube to prevent TF reflux - Daily labs ordered over this weekend -04/23/23 WBC down to 11.0 --> 10.6 --> 12 3/9 3-11: Clinically stable, cervical x-ray today showed right middle lobe consolidation/pneumonia, not producing sputum to culture.  Will repeat labs in a.m. and if WBC uptrending will broaden antibiotics. 3-12: Afebrile, tachycardic overnight.  WBC uptrending.  Getting two-view x-ray today, lactic acid, CRP, and blood cultures.  Discussing with pharmacy broadening antibiotics from Zosyn, given failure of 5 day course--they recommend continuing Zosyn.  20.  Hypocalcemia.  On Os-Cal 3 times daily for supplementation.  Vitamin D within normal limits.  21.  Left eye injection.  Improved with use of erythromycin eyedrops; has topical ointment  -3-10: DC erythromycin  22.  Bowel and bladder incontinence, diarrhea.   - Incontinence  has been ongoing since first admission, not responsive to timed toileting, without retention, negative for UTI   - Diarrhea likely secondary to tube feeds and antibiotics; add Banatrol 20 mg twice daily and as needed Imodium  3/12: Increase Banatrol to 20 mg 4 times daily.  Discussed ongoing incontinence with Dr. Ilsa Iha ID, given that leukocytosis will get sample for C. difficile testing today.  LOS: 9 days A FACE TO FACE EVALUATION WAS PERFORMED  Angelina Sheriff 04/27/2023, 8:28 AM

## 2023-04-27 NOTE — Progress Notes (Signed)
 Met with patient today. Patient is confused . Will be back to review plan of care when family is in the room.

## 2023-04-27 NOTE — Progress Notes (Signed)
 Patient received in the floor at 1650 pm. On RA. Tube feed is being continued. Cervical color is on.  Patient is NPO. Skin is intact. Connected to cardiac monitor box 13. Will continue to monitor

## 2023-04-27 NOTE — Plan of Care (Signed)
 Patient is being discharged to acute care;

## 2023-04-28 ENCOUNTER — Encounter: Payer: Medicare Other | Admitting: Neurosurgery

## 2023-04-28 DIAGNOSIS — D333 Benign neoplasm of cranial nerves: Secondary | ICD-10-CM | POA: Diagnosis not present

## 2023-04-28 DIAGNOSIS — S12000A Unspecified displaced fracture of first cervical vertebra, initial encounter for closed fracture: Secondary | ICD-10-CM

## 2023-04-28 DIAGNOSIS — R6510 Systemic inflammatory response syndrome (SIRS) of non-infectious origin without acute organ dysfunction: Secondary | ICD-10-CM | POA: Diagnosis not present

## 2023-04-28 DIAGNOSIS — G936 Cerebral edema: Secondary | ICD-10-CM | POA: Diagnosis not present

## 2023-04-28 DIAGNOSIS — R1310 Dysphagia, unspecified: Secondary | ICD-10-CM

## 2023-04-28 DIAGNOSIS — I10 Essential (primary) hypertension: Secondary | ICD-10-CM

## 2023-04-28 LAB — CBC
HCT: 30.3 % — ABNORMAL LOW (ref 36.0–46.0)
Hemoglobin: 9.8 g/dL — ABNORMAL LOW (ref 12.0–15.0)
MCH: 29.4 pg (ref 26.0–34.0)
MCHC: 32.3 g/dL (ref 30.0–36.0)
MCV: 91 fL (ref 80.0–100.0)
Platelets: 502 10*3/uL — ABNORMAL HIGH (ref 150–400)
RBC: 3.33 MIL/uL — ABNORMAL LOW (ref 3.87–5.11)
RDW: 15 % (ref 11.5–15.5)
WBC: 13.8 10*3/uL — ABNORMAL HIGH (ref 4.0–10.5)
nRBC: 0 % (ref 0.0–0.2)

## 2023-04-28 LAB — GASTROINTESTINAL PANEL BY PCR, STOOL (REPLACES STOOL CULTURE)

## 2023-04-28 LAB — BASIC METABOLIC PANEL
Anion gap: 10 (ref 5–15)
BUN: 14 mg/dL (ref 8–23)
CO2: 18 mmol/L — ABNORMAL LOW (ref 22–32)
Calcium: 8.2 mg/dL — ABNORMAL LOW (ref 8.9–10.3)
Chloride: 107 mmol/L (ref 98–111)
Creatinine, Ser: 0.53 mg/dL (ref 0.44–1.00)
GFR, Estimated: >60 mL/min (ref >60)
Glucose, Bld: 137 mg/dL — ABNORMAL HIGH (ref 70–99)
Potassium: 4.4 mmol/L (ref 3.5–5.1)
Sodium: 135 mmol/L (ref 135–145)

## 2023-04-28 LAB — GLUCOSE, CAPILLARY
Glucose-Capillary: 102 mg/dL — ABNORMAL HIGH (ref 70–99)
Glucose-Capillary: 120 mg/dL — ABNORMAL HIGH (ref 70–99)
Glucose-Capillary: 127 mg/dL — ABNORMAL HIGH (ref 70–99)
Glucose-Capillary: 137 mg/dL — ABNORMAL HIGH (ref 70–99)
Glucose-Capillary: 155 mg/dL — ABNORMAL HIGH (ref 70–99)
Glucose-Capillary: 93 mg/dL (ref 70–99)

## 2023-04-28 MED ORDER — LOSARTAN POTASSIUM 50 MG PO TABS
100.0000 mg | ORAL_TABLET | Freq: Every day | ORAL | Status: DC
  Administered 2023-04-28 – 2023-05-09 (×12): 100 mg
  Filled 2023-04-28 (×12): qty 2

## 2023-04-28 MED ORDER — VANCOMYCIN 50 MG/ML ORAL SOLUTION
125.0000 mg | Freq: Four times a day (QID) | ORAL | Status: AC
  Administered 2023-04-28 – 2023-05-08 (×40): 125 mg
  Filled 2023-04-28 (×40): qty 2.5

## 2023-04-28 MED ORDER — PROSOURCE TF20 ENFIT COMPATIBL EN LIQD
60.0000 mL | Freq: Every day | ENTERAL | Status: DC
  Administered 2023-04-28 – 2023-06-09 (×42): 60 mL
  Filled 2023-04-28 (×42): qty 60

## 2023-04-28 MED ORDER — OSMOLITE 1.5 CAL PO LIQD
1000.0000 mL | ORAL | Status: DC
  Administered 2023-04-28: 1000 mL
  Filled 2023-04-28 (×2): qty 1000

## 2023-04-28 MED ORDER — OSMOLITE 1.5 CAL PO LIQD
1000.0000 mL | ORAL | Status: DC
  Administered 2023-04-29: 1000 mL
  Filled 2023-04-28 (×2): qty 1000
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
## 2023-04-28 NOTE — Progress Notes (Signed)
 Physical Therapy Note  Patient Details  Name: Rita Taylor MRN: [id number] Date of Birth: 1940/08/27 Today's Date: 04/28/2023    Physical Therapy Discharge Note  This patient was unable to complete the inpatient rehab program due to unplanned DC to acute; therefore did not meet their long term goals. Pt left the program at a total assist level for their functional mobility/ transfers. This patient is being discharged from PT services at this time.  Pt's perception of pain in the last five days was unable to answer at this time.    See CareTool for functional status details  If the patient is able to return to inpatient rehabilitation within 3 midnights, this may be considered an interrupted stay and therapy services will resume as ordered. Modification and reinstatement of their goals will be made upon completion of therapy service reevaluations.     Edwin Cap PT, DPT 04/28/2023, 8:06 AM
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
## 2023-04-28 NOTE — Discharge Summary (Signed)
 Speech Therapy Discharge Note  This patient was unable to complete the inpatient rehab program due to transfer back to acute care; therefore, the patient did not meet their long term goals and has been discharged from skilled SLP services at this time.The patient left the program at a max-totalA assist level for overall cognitive functioning. The patient is currently NPO again d/t aspiration PNA and highly variable attention/alertness. During trials, she required maxA assist for use of swallowing compensatory strategies.    See CareTool for functional status details.  If the patient is able to return to inpatient rehabilitation within 3 midnights, this may be considered an interrupted stay and therapy services will resume as ordered. Modification and reinstatement of their goals will be made upon completion of therapy service reevaluations.
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
## 2023-04-29 DIAGNOSIS — G936 Cerebral edema: Secondary | ICD-10-CM | POA: Diagnosis not present

## 2023-04-29 DIAGNOSIS — D333 Benign neoplasm of cranial nerves: Secondary | ICD-10-CM | POA: Diagnosis not present

## 2023-04-29 DIAGNOSIS — Z515 Encounter for palliative care: Secondary | ICD-10-CM

## 2023-04-29 DIAGNOSIS — S12000A Unspecified displaced fracture of first cervical vertebra, initial encounter for closed fracture: Secondary | ICD-10-CM | POA: Diagnosis not present

## 2023-04-29 DIAGNOSIS — Z7189 Other specified counseling: Secondary | ICD-10-CM

## 2023-04-29 DIAGNOSIS — R6510 Systemic inflammatory response syndrome (SIRS) of non-infectious origin without acute organ dysfunction: Secondary | ICD-10-CM | POA: Diagnosis not present

## 2023-04-29 LAB — RENAL FUNCTION PANEL
Albumin: 2.9 g/dL — ABNORMAL LOW (ref 3.5–5.0)
Anion gap: 10 (ref 5–15)
BUN: 16 mg/dL (ref 8–23)
CO2: 20 mmol/L — ABNORMAL LOW (ref 22–32)
Calcium: 8.9 mg/dL (ref 8.9–10.3)
Chloride: 107 mmol/L (ref 98–111)
Creatinine, Ser: 0.57 mg/dL (ref 0.44–1.00)
GFR, Estimated: >60 mL/min (ref >60)
Glucose, Bld: 122 mg/dL — ABNORMAL HIGH (ref 70–99)
Phosphorus: 4.2 mg/dL (ref 2.5–4.6)
Potassium: 4.5 mmol/L (ref 3.5–5.1)
Sodium: 137 mmol/L (ref 135–145)

## 2023-04-29 LAB — CBC
HCT: 35.5 % — ABNORMAL LOW (ref 36.0–46.0)
Hemoglobin: 11 g/dL — ABNORMAL LOW (ref 12.0–15.0)
MCH: 28.9 pg (ref 26.0–34.0)
MCHC: 31 g/dL (ref 30.0–36.0)
MCV: 93.4 fL (ref 80.0–100.0)
Platelets: 499 10*3/uL — ABNORMAL HIGH (ref 150–400)
RBC: 3.8 MIL/uL — ABNORMAL LOW (ref 3.87–5.11)
RDW: 15 % (ref 11.5–15.5)
WBC: 11.1 10*3/uL — ABNORMAL HIGH (ref 4.0–10.5)
nRBC: 0 % (ref 0.0–0.2)

## 2023-04-29 LAB — GLUCOSE, CAPILLARY
Glucose-Capillary: 127 mg/dL — ABNORMAL HIGH (ref 70–99)
Glucose-Capillary: 99 mg/dL (ref 70–99)
Glucose-Capillary: 110 mg/dL — ABNORMAL HIGH (ref 70–99)
Glucose-Capillary: 128 mg/dL — ABNORMAL HIGH (ref 70–99)
Glucose-Capillary: 121 mg/dL — ABNORMAL HIGH (ref 70–99)
Glucose-Capillary: 127 mg/dL — ABNORMAL HIGH (ref 70–99)

## 2023-04-29 LAB — CLOSTRIDIUM DIFFICILE BY PCR, REFLEXED: Toxigenic C. Difficile by PCR: POSITIVE — AB

## 2023-04-29 MED ORDER — JEVITY 1.5 CAL/FIBER PO LIQD
1000.0000 mL | ORAL | Status: DC
Start: 2023-04-29 — End: 2023-04-29

## 2023-04-29 MED ORDER — BANATROL TF EN LIQD
60.0000 mL | Freq: Two times a day (BID) | ENTERAL | Status: DC
  Administered 2023-04-29 – 2023-05-11 (×25): 60 mL
  Filled 2023-04-29 (×25): qty 60

## 2023-04-29 MED ORDER — VITAL 1.5 CAL PO LIQD
1000.0000 mL | ORAL | Status: DC
  Administered 2023-04-29 – 2023-05-03 (×5): 1000 mL
  Filled 2023-04-29 (×6): qty 1000

## 2023-04-29 NOTE — Plan of Care (Signed)
  Problem: Clinical Measurements: Goal: Respiratory complications will improve Outcome: Progressing Goal: Cardiovascular complication will be avoided Outcome: Progressing   Problem: Nutrition: Goal: Adequate nutrition will be maintained Outcome: Progressing   Problem: Elimination: Goal: Will not experience complications related to bowel motility Outcome: Progressing Goal: Will not experience complications related to urinary retention Outcome: Progressing   Problem: Pain Managment: Goal: General experience of comfort will improve and/or be controlled Outcome: Progressing   Problem: Safety: Goal: Ability to remain free from injury will improve Outcome: Progressing   Problem: Metabolic: Goal: Ability to maintain appropriate glucose levels will improve Outcome: Progressing   Problem: Nutritional: Goal: Maintenance of adequate nutrition will improve Outcome: Progressing

## 2023-04-29 NOTE — Progress Notes (Signed)
 Nutrition Brief Note RD received consult to change tube feeds to help with patient's multiple loose stools. Concern for patient to have C.diff. C.diff test showed antigen positive toxin negative, MD to obtain PCR today. Patient has been on current TF formula since 2/26 with no multiple loose stools noted. RD does not suspect TF's are causing this acute problem however, will switch to a peptide-based elemental formula in hopes to slow down bowel movements.   INTERVENTION:  Continue tube feeding via Cortrak: Change from Osmolite 1.5 to Vital 1.5 at 45 ml/h (1080 ml per day) Prosource TF20 60 ml daily 200 FWF Q6H   Provides 1700 kcal, 93 gm protein, 825 ml free water daily (1625 ml water daily TF+ FWF)   Banatrol BID per tube  Monitor diet advancement  If plan is to discharge to SNF, patient will need PEG tube   Elliot Dally, RD Registered Dietitian  See Amion for more information
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
## 2023-04-29 NOTE — Plan of Care (Signed)
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
## 2023-04-30 DIAGNOSIS — D333 Benign neoplasm of cranial nerves: Secondary | ICD-10-CM | POA: Diagnosis not present

## 2023-04-30 DIAGNOSIS — E43 Unspecified severe protein-calorie malnutrition: Secondary | ICD-10-CM

## 2023-04-30 DIAGNOSIS — S12000A Unspecified displaced fracture of first cervical vertebra, initial encounter for closed fracture: Secondary | ICD-10-CM | POA: Diagnosis not present

## 2023-04-30 DIAGNOSIS — G936 Cerebral edema: Secondary | ICD-10-CM | POA: Diagnosis not present

## 2023-04-30 DIAGNOSIS — R6510 Systemic inflammatory response syndrome (SIRS) of non-infectious origin without acute organ dysfunction: Secondary | ICD-10-CM | POA: Diagnosis not present

## 2023-04-30 DIAGNOSIS — A0472 Enterocolitis due to Clostridium difficile, not specified as recurrent: Secondary | ICD-10-CM | POA: Diagnosis present

## 2023-04-30 LAB — GLUCOSE, CAPILLARY
Glucose-Capillary: 106 mg/dL — ABNORMAL HIGH (ref 70–99)
Glucose-Capillary: 111 mg/dL — ABNORMAL HIGH (ref 70–99)
Glucose-Capillary: 111 mg/dL — ABNORMAL HIGH (ref 70–99)
Glucose-Capillary: 133 mg/dL — ABNORMAL HIGH (ref 70–99)
Glucose-Capillary: 134 mg/dL — ABNORMAL HIGH (ref 70–99)
Glucose-Capillary: 138 mg/dL — ABNORMAL HIGH (ref 70–99)
Glucose-Capillary: 98 mg/dL (ref 70–99)

## 2023-04-30 LAB — RENAL FUNCTION PANEL
Albumin: 2.9 g/dL — ABNORMAL LOW (ref 3.5–5.0)
Anion gap: 12 (ref 5–15)
BUN: 21 mg/dL (ref 8–23)
CO2: 18 mmol/L — ABNORMAL LOW (ref 22–32)
Calcium: 8.7 mg/dL — ABNORMAL LOW (ref 8.9–10.3)
Chloride: 106 mmol/L (ref 98–111)
Creatinine, Ser: 0.61 mg/dL (ref 0.44–1.00)
GFR, Estimated: >60 mL/min (ref >60)
Glucose, Bld: 131 mg/dL — ABNORMAL HIGH (ref 70–99)
Phosphorus: 4.6 mg/dL (ref 2.5–4.6)
Potassium: 4.6 mmol/L (ref 3.5–5.1)
Sodium: 136 mmol/L (ref 135–145)

## 2023-04-30 LAB — CBC
HCT: 33.3 % — ABNORMAL LOW (ref 36.0–46.0)
Hemoglobin: 10.7 g/dL — ABNORMAL LOW (ref 12.0–15.0)
MCH: 29 pg (ref 26.0–34.0)
MCHC: 32.1 g/dL (ref 30.0–36.0)
MCV: 90.2 fL (ref 80.0–100.0)
Platelets: 510 10*3/uL — ABNORMAL HIGH (ref 150–400)
RBC: 3.69 MIL/uL — ABNORMAL LOW (ref 3.87–5.11)
RDW: 15 % (ref 11.5–15.5)
WBC: 8.7 10*3/uL (ref 4.0–10.5)
nRBC: 0 % (ref 0.0–0.2)
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
## 2023-04-30 NOTE — Plan of Care (Signed)
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
## 2023-05-01 DIAGNOSIS — A0472 Enterocolitis due to Clostridium difficile, not specified as recurrent: Secondary | ICD-10-CM

## 2023-05-01 DIAGNOSIS — D333 Benign neoplasm of cranial nerves: Secondary | ICD-10-CM | POA: Diagnosis not present

## 2023-05-01 DIAGNOSIS — R6510 Systemic inflammatory response syndrome (SIRS) of non-infectious origin without acute organ dysfunction: Secondary | ICD-10-CM | POA: Diagnosis not present

## 2023-05-01 DIAGNOSIS — S12000A Unspecified displaced fracture of first cervical vertebra, initial encounter for closed fracture: Secondary | ICD-10-CM | POA: Diagnosis not present

## 2023-05-01 LAB — GLUCOSE, CAPILLARY
Glucose-Capillary: 115 mg/dL — ABNORMAL HIGH (ref 70–99)
Glucose-Capillary: 120 mg/dL — ABNORMAL HIGH (ref 70–99)
Glucose-Capillary: 122 mg/dL — ABNORMAL HIGH (ref 70–99)
Glucose-Capillary: 122 mg/dL — ABNORMAL HIGH (ref 70–99)
Glucose-Capillary: 151 mg/dL — ABNORMAL HIGH (ref 70–99)
Glucose-Capillary: 68 mg/dL — ABNORMAL LOW (ref 70–99)
Glucose-Capillary: 97 mg/dL (ref 70–99)

## 2023-05-01 LAB — RENAL FUNCTION PANEL
Albumin: 2.9 g/dL — ABNORMAL LOW (ref 3.5–5.0)
Anion gap: 12 (ref 5–15)
BUN: 23 mg/dL (ref 8–23)
CO2: 20 mmol/L — ABNORMAL LOW (ref 22–32)
Calcium: 8.8 mg/dL — ABNORMAL LOW (ref 8.9–10.3)
Chloride: 107 mmol/L (ref 98–111)
Creatinine, Ser: 0.7 mg/dL (ref 0.44–1.00)
GFR, Estimated: >60 mL/min (ref >60)
Glucose, Bld: 124 mg/dL — ABNORMAL HIGH (ref 70–99)
Phosphorus: 4.4 mg/dL (ref 2.5–4.6)
Potassium: 4.8 mmol/L (ref 3.5–5.1)
Sodium: 139 mmol/L (ref 135–145)

## 2023-05-01 LAB — CBC
HCT: 34.1 % — ABNORMAL LOW (ref 36.0–46.0)
Hemoglobin: 10.8 g/dL — ABNORMAL LOW (ref 12.0–15.0)
MCH: 29 pg (ref 26.0–34.0)
MCHC: 31.7 g/dL (ref 30.0–36.0)
MCV: 91.7 fL (ref 80.0–100.0)
Platelets: 514 10*3/uL — ABNORMAL HIGH (ref 150–400)
RBC: 3.72 MIL/uL — ABNORMAL LOW (ref 3.87–5.11)
RDW: 14.8 % (ref 11.5–15.5)
WBC: 9.9 10*3/uL (ref 4.0–10.5)
nRBC: 0 % (ref 0.0–0.2)

## 2023-05-01 MED ORDER — DEXTROSE 50 % IV SOLN
12.5000 g | INTRAVENOUS | Status: AC
Start: 2023-05-01 — End: 2023-05-01
  Administered 2023-05-01: 12.5 g via INTRAVENOUS
  Filled 2023-05-01: qty 50

## 2023-05-01 NOTE — Plan of Care (Signed)
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
## 2023-05-02 ENCOUNTER — Telehealth: Payer: Self-pay | Admitting: Registered Nurse

## 2023-05-02 DIAGNOSIS — A0472 Enterocolitis due to Clostridium difficile, not specified as recurrent: Secondary | ICD-10-CM | POA: Diagnosis not present

## 2023-05-02 DIAGNOSIS — D333 Benign neoplasm of cranial nerves: Secondary | ICD-10-CM | POA: Diagnosis not present

## 2023-05-02 DIAGNOSIS — G936 Cerebral edema: Secondary | ICD-10-CM | POA: Diagnosis not present

## 2023-05-02 DIAGNOSIS — R6510 Systemic inflammatory response syndrome (SIRS) of non-infectious origin without acute organ dysfunction: Secondary | ICD-10-CM | POA: Diagnosis not present

## 2023-05-02 DIAGNOSIS — S12000A Unspecified displaced fracture of first cervical vertebra, initial encounter for closed fracture: Secondary | ICD-10-CM | POA: Diagnosis not present

## 2023-05-02 LAB — RENAL FUNCTION PANEL
Albumin: 2.9 g/dL — ABNORMAL LOW (ref 3.5–5.0)
Anion gap: 12 (ref 5–15)
BUN: 23 mg/dL (ref 8–23)
CO2: 19 mmol/L — ABNORMAL LOW (ref 22–32)
Calcium: 8.5 mg/dL — ABNORMAL LOW (ref 8.9–10.3)
Chloride: 103 mmol/L (ref 98–111)
Creatinine, Ser: 0.59 mg/dL (ref 0.44–1.00)
GFR, Estimated: >60 mL/min (ref >60)
Glucose, Bld: 134 mg/dL — ABNORMAL HIGH (ref 70–99)
Phosphorus: 3.9 mg/dL (ref 2.5–4.6)
Potassium: 4.1 mmol/L (ref 3.5–5.1)
Sodium: 134 mmol/L — ABNORMAL LOW (ref 135–145)

## 2023-05-02 LAB — GLUCOSE, CAPILLARY
Glucose-Capillary: 79 mg/dL (ref 70–99)
Glucose-Capillary: 72 mg/dL (ref 70–99)
Glucose-Capillary: 105 mg/dL — ABNORMAL HIGH (ref 70–99)
Glucose-Capillary: 84 mg/dL (ref 70–99)
Glucose-Capillary: 65 mg/dL — ABNORMAL LOW (ref 70–99)
Glucose-Capillary: 133 mg/dL — ABNORMAL HIGH (ref 70–99)
Glucose-Capillary: 121 mg/dL — ABNORMAL HIGH (ref 70–99)

## 2023-05-02 LAB — CULTURE, BLOOD (ROUTINE X 2)
Culture: NO GROWTH
Culture: NO GROWTH
Special Requests: ADEQUATE
Special Requests: ADEQUATE

## 2023-05-02 MED ORDER — METOPROLOL TARTRATE 50 MG PO TABS
75.0000 mg | ORAL_TABLET | Freq: Two times a day (BID) | ORAL | Status: DC
  Administered 2023-05-02 – 2023-05-04 (×5): 75 mg
  Filled 2023-05-02 (×5): qty 1
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
## 2023-05-02 NOTE — Telephone Encounter (Signed)
 Dr Shearon Stalls,  Can you review family My- Chart message. I haven't seen this patient yet. She is scheduled for office visit on 05/11/2023.  I will await your input.

## 2023-05-02 NOTE — Telephone Encounter (Signed)
 So, she is currently admitted to the hospital in part for these issues; she was sent there from rehab and is pursuing SNF. As such, it would be more appropriate for the primary team taking care of her now to address this in the hospital. I'll send a message to Dr. Isidoro Donning about it. Thanks!
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
## 2023-05-03 ENCOUNTER — Inpatient Hospital Stay (HOSPITAL_COMMUNITY)

## 2023-05-03 DIAGNOSIS — S12000A Unspecified displaced fracture of first cervical vertebra, initial encounter for closed fracture: Secondary | ICD-10-CM | POA: Diagnosis not present

## 2023-05-03 DIAGNOSIS — A0472 Enterocolitis due to Clostridium difficile, not specified as recurrent: Secondary | ICD-10-CM | POA: Diagnosis not present

## 2023-05-03 DIAGNOSIS — D333 Benign neoplasm of cranial nerves: Secondary | ICD-10-CM | POA: Diagnosis not present

## 2023-05-03 DIAGNOSIS — E43 Unspecified severe protein-calorie malnutrition: Secondary | ICD-10-CM | POA: Diagnosis not present

## 2023-05-03 DIAGNOSIS — R6510 Systemic inflammatory response syndrome (SIRS) of non-infectious origin without acute organ dysfunction: Secondary | ICD-10-CM | POA: Diagnosis not present

## 2023-05-03 LAB — GLUCOSE, CAPILLARY
Glucose-Capillary: 106 mg/dL — ABNORMAL HIGH (ref 70–99)
Glucose-Capillary: 106 mg/dL — ABNORMAL HIGH (ref 70–99)
Glucose-Capillary: 116 mg/dL — ABNORMAL HIGH (ref 70–99)
Glucose-Capillary: 137 mg/dL — ABNORMAL HIGH (ref 70–99)
Glucose-Capillary: 155 mg/dL — ABNORMAL HIGH (ref 70–99)
Glucose-Capillary: 99 mg/dL (ref 70–99)

## 2023-05-03 LAB — BASIC METABOLIC PANEL
Anion gap: 15 (ref 5–15)
BUN: 28 mg/dL — ABNORMAL HIGH (ref 8–23)
CO2: 17 mmol/L — ABNORMAL LOW (ref 22–32)
Calcium: 9.1 mg/dL (ref 8.9–10.3)
Chloride: 104 mmol/L (ref 98–111)
Creatinine, Ser: 0.56 mg/dL (ref 0.44–1.00)
GFR, Estimated: >60 mL/min (ref >60)
Glucose, Bld: 130 mg/dL — ABNORMAL HIGH (ref 70–99)
Potassium: 4.4 mmol/L (ref 3.5–5.1)
Sodium: 136 mmol/L (ref 135–145)

## 2023-05-03 LAB — CBC
HCT: 35.2 % — ABNORMAL LOW (ref 36.0–46.0)
Hemoglobin: 11.1 g/dL — ABNORMAL LOW (ref 12.0–15.0)
MCH: 29.4 pg (ref 26.0–34.0)
MCHC: 31.5 g/dL (ref 30.0–36.0)
MCV: 93.1 fL (ref 80.0–100.0)
Platelets: 513 10*3/uL — ABNORMAL HIGH (ref 150–400)
RBC: 3.78 MIL/uL — ABNORMAL LOW (ref 3.87–5.11)
RDW: 15 % (ref 11.5–15.5)
WBC: 12.2 10*3/uL — ABNORMAL HIGH (ref 4.0–10.5)
nRBC: 0 % (ref 0.0–0.2)

## 2023-05-03 MED ORDER — ENSURE ENLIVE PO LIQD
237.0000 mL | Freq: Two times a day (BID) | ORAL | Status: DC
  Administered 2023-05-03 – 2023-06-09 (×54): 237 mL via ORAL

## 2023-05-03 MED ORDER — VITAL 1.5 CAL PO LIQD
1000.0000 mL | ORAL | Status: DC
  Administered 2023-05-04 – 2023-05-17 (×15): 1000 mL
  Filled 2023-05-03 (×19): qty 1000

## 2023-05-03 MED ORDER — FREE WATER
100.0000 mL | Freq: Four times a day (QID) | Status: DC
  Administered 2023-05-03 – 2023-05-24 (×80): 100 mL

## 2023-05-03 NOTE — Plan of Care (Signed)
   Problem: Education: Goal: Knowledge of General Education information will improve Description Including pain rating scale, medication(s)/side effects and non-pharmacologic comfort measures Outcome: Progressing
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
## 2023-05-03 NOTE — Consult Note (Signed)
 [facility] Liaison Consult Note   05/03/2023  Rita Taylor [birth year] [id number]  Insurance: Medicare ACO  Primary Care Provider: [name], MD with [facility] provider is listed for the transition of care follow up appointments  and [facility] calls   [facility] Liaison screened the patient remotely at [facility]. Patient on enteric precaution, PPE preserved for hospital staff.   The patient was screened for 30 day readmission hospitalization with noted high risk score for unplanned readmission risk 5 hospital admissions in 6 months.  The patient was assessed for potential [facility] Coordination service needs for post hospital transition for care coordination. Review of patient's electronic medical record reveals patient is readmitted to acute setting from CIR on 04/27/23 with SIRS, Failure to Thrive, with c diff colitis  Plan: [facility] Liaison will continue to follow progress and disposition to asess for post hospital community care coordination/management needs.  Referral request for community care coordination: Current recommendations are for a SNF level rehab stay for post hospital transition.   [facility] Community Care, Population Health does not replace or interfere with any arrangements made by the Inpatient Transition of Care team.   For questions contact:   Charlesetta Shanks, RN, BSN, CCM [place]  [facility], [facility] Liaison Direct Dial: [phone] or secure chat Email: [email or web address]

## 2023-05-03 NOTE — Plan of Care (Signed)
  Problem: Clinical Measurements: Goal: Ability to maintain clinical measurements within normal limits will improve Outcome: Progressing Goal: Will remain free from infection Outcome: Progressing Goal: Diagnostic test results will improve Outcome: Progressing Goal: Respiratory complications will improve Outcome: Progressing Goal: Cardiovascular complication will be avoided Outcome: Progressing   Problem: Activity: Goal: Risk for activity intolerance will decrease Outcome: Progressing   Problem: Nutrition: Goal: Adequate nutrition will be maintained Outcome: Progressing   Problem: Coping: Goal: Level of anxiety will decrease Outcome: Progressing   Problem: Elimination: Goal: Will not experience complications related to bowel motility Outcome: Progressing Goal: Will not experience complications related to urinary retention Outcome: Progressing   Problem: Pain Managment: Goal: General experience of comfort will improve and/or be controlled Outcome: Progressing   Problem: Safety: Goal: Ability to remain free from injury will improve Outcome: Progressing   Problem: Skin Integrity: Goal: Risk for impaired skin integrity will decrease Outcome: Progressing   Problem: Education: Goal: Ability to describe self-care measures that may prevent or decrease complications (Diabetes Survival Skills Education) will improve Outcome: Progressing Goal: Individualized Educational Video(s) Outcome: Progressing   Problem: Coping: Goal: Ability to adjust to condition or change in health will improve Outcome: Progressing   Problem: Fluid Volume: Goal: Ability to maintain a balanced intake and output will improve Outcome: Progressing   Problem: Health Behavior/Discharge Planning: Goal: Ability to identify and utilize available resources and services will improve Outcome: Progressing Goal: Ability to manage health-related needs will improve Outcome: Progressing   Problem:  Metabolic: Goal: Ability to maintain appropriate glucose levels will improve Outcome: Progressing   Problem: Nutritional: Goal: Maintenance of adequate nutrition will improve Outcome: Progressing Goal: Progress toward achieving an optimal weight will improve Outcome: Progressing   Problem: Skin Integrity: Goal: Risk for impaired skin integrity will decrease Outcome: Progressing   Problem: Tissue Perfusion: Goal: Adequacy of tissue perfusion will improve Outcome: Progressing
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
## 2023-05-04 ENCOUNTER — Encounter: Payer: Self-pay | Admitting: Neurosurgery

## 2023-05-04 DIAGNOSIS — G936 Cerebral edema: Secondary | ICD-10-CM | POA: Diagnosis not present

## 2023-05-04 DIAGNOSIS — S12000A Unspecified displaced fracture of first cervical vertebra, initial encounter for closed fracture: Secondary | ICD-10-CM | POA: Diagnosis not present

## 2023-05-04 DIAGNOSIS — R6510 Systemic inflammatory response syndrome (SIRS) of non-infectious origin without acute organ dysfunction: Secondary | ICD-10-CM | POA: Diagnosis not present

## 2023-05-04 DIAGNOSIS — A0472 Enterocolitis due to Clostridium difficile, not specified as recurrent: Secondary | ICD-10-CM | POA: Diagnosis not present

## 2023-05-04 DIAGNOSIS — D333 Benign neoplasm of cranial nerves: Secondary | ICD-10-CM | POA: Diagnosis not present

## 2023-05-04 LAB — GLUCOSE, CAPILLARY
Glucose-Capillary: 119 mg/dL — ABNORMAL HIGH (ref 70–99)
Glucose-Capillary: 124 mg/dL — ABNORMAL HIGH (ref 70–99)
Glucose-Capillary: 130 mg/dL — ABNORMAL HIGH (ref 70–99)
Glucose-Capillary: 166 mg/dL — ABNORMAL HIGH (ref 70–99)
Glucose-Capillary: 75 mg/dL (ref 70–99)
Glucose-Capillary: 79 mg/dL (ref 70–99)

## 2023-05-04 LAB — CREATININE, SERUM
Creatinine, Ser: 0.53 mg/dL (ref 0.44–1.00)
GFR, Estimated: >60 mL/min (ref >60)

## 2023-05-04 MED ORDER — METOPROLOL TARTRATE 50 MG PO TABS
100.0000 mg | ORAL_TABLET | Freq: Two times a day (BID) | ORAL | Status: DC
  Administered 2023-05-04 – 2023-05-09 (×9): 100 mg
  Filled 2023-05-04 (×10): qty 2

## 2023-05-04 NOTE — Plan of Care (Signed)
   Problem: Education: Goal: Knowledge of General Education information will improve Description: Including pain rating scale, medication(s)/side effects and non-pharmacologic comfort measures Outcome: Progressing   Problem: Pain Managment: Goal: General experience of comfort will improve and/or be controlled Outcome: Progressing   Problem: Safety: Goal: Ability to remain free from injury will improve Outcome: Progressing

## 2023-05-04 NOTE — Progress Notes (Signed)
 Mobility Specialist: Progress Note   05/04/23 1052  Mobility  Activity Dangled on edge of bed  Level of Assistance Maximum assist, patient does 25-49%  Activity Response Tolerated poorly  Mobility Referral Yes  Mobility visit 1 Mobility  Mobility Specialist Start Time (ACUTE ONLY) 0850  Mobility Specialist Stop Time (ACUTE ONLY) 0908  Mobility Specialist Time Calculation (min) (ACUTE ONLY) 18 min    Pt was agreeable to mobility session - received in bed. 3x attempt to fully sit EOB with maxA but unsuccessful. Denies pain but shakes head in agreement to feeling too weak. Returned to supine. Left in bed with all needs met, call bell in reach. Hand mit restraints on. Pt agreeable to try again later today, MS will f/u if time allows.   Maurene Capes Mobility Specialist Please contact via SecureChat or Rehab office at [phone]
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
## 2023-05-04 NOTE — Progress Notes (Signed)
 Mobility Specialist: Progress Note   05/04/23 1559  Mobility  Activity Dangled on edge of bed;Turned to right side;Turned to left side;Turned to back - supine  Level of Assistance Minimal assist, patient does 75% or more  Activity Response Tolerated fair  Mobility Referral Yes  Mobility visit 1 Mobility  Mobility Specialist Start Time (ACUTE ONLY) 1545  Mobility Specialist Stop Time (ACUTE ONLY) 1600  Mobility Specialist Time Calculation (min) (ACUTE ONLY) 15 min    Pt was agreeable to mobility session - received in bed. MinA for bed mobility to EOB with cues needed for extending BLEs and assistance with trunk elevation. Upon EOB discovered pt had a BM. Pt declined attempting to stand for pericare and linen change. Rolled side to side with minA to perform pericare and linen change. TotA to slide up in bed. Left in supine with all needs met, call bell in reach. Bed alarm on. RN in room.   Maurene Capes Mobility Specialist Please contact via SecureChat or Rehab office at [phone]
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
## 2023-05-05 ENCOUNTER — Inpatient Hospital Stay (HOSPITAL_COMMUNITY)

## 2023-05-05 DIAGNOSIS — A0472 Enterocolitis due to Clostridium difficile, not specified as recurrent: Secondary | ICD-10-CM | POA: Diagnosis not present

## 2023-05-05 DIAGNOSIS — Z982 Presence of cerebrospinal fluid drainage device: Secondary | ICD-10-CM | POA: Diagnosis not present

## 2023-05-05 DIAGNOSIS — G936 Cerebral edema: Secondary | ICD-10-CM | POA: Diagnosis not present

## 2023-05-05 DIAGNOSIS — R6510 Systemic inflammatory response syndrome (SIRS) of non-infectious origin without acute organ dysfunction: Secondary | ICD-10-CM | POA: Diagnosis not present

## 2023-05-05 DIAGNOSIS — S12000A Unspecified displaced fracture of first cervical vertebra, initial encounter for closed fracture: Secondary | ICD-10-CM | POA: Diagnosis not present

## 2023-05-05 DIAGNOSIS — D333 Benign neoplasm of cranial nerves: Secondary | ICD-10-CM | POA: Diagnosis not present

## 2023-05-05 DIAGNOSIS — R4182 Altered mental status, unspecified: Secondary | ICD-10-CM | POA: Diagnosis not present

## 2023-05-05 LAB — BASIC METABOLIC PANEL
Anion gap: 11 (ref 5–15)
BUN: 28 mg/dL — ABNORMAL HIGH (ref 8–23)
CO2: 23 mmol/L (ref 22–32)
Calcium: 9.2 mg/dL (ref 8.9–10.3)
Chloride: 106 mmol/L (ref 98–111)
Creatinine, Ser: 0.56 mg/dL (ref 0.44–1.00)
GFR, Estimated: >60 mL/min (ref >60)
Glucose, Bld: 145 mg/dL — ABNORMAL HIGH (ref 70–99)
Potassium: 4.7 mmol/L (ref 3.5–5.1)
Sodium: 140 mmol/L (ref 135–145)

## 2023-05-05 LAB — CBC
HCT: 36.7 % (ref 36.0–46.0)
Hemoglobin: 11.7 g/dL — ABNORMAL LOW (ref 12.0–15.0)
MCH: 29.6 pg (ref 26.0–34.0)
MCHC: 31.9 g/dL (ref 30.0–36.0)
MCV: 92.9 fL (ref 80.0–100.0)
Platelets: 514 10*3/uL — ABNORMAL HIGH (ref 150–400)
RBC: 3.95 MIL/uL (ref 3.87–5.11)
RDW: 14.8 % (ref 11.5–15.5)
WBC: 11.9 10*3/uL — ABNORMAL HIGH (ref 4.0–10.5)
nRBC: 0 % (ref 0.0–0.2)

## 2023-05-05 LAB — GLUCOSE, CAPILLARY
Glucose-Capillary: 111 mg/dL — ABNORMAL HIGH (ref 70–99)
Glucose-Capillary: 126 mg/dL — ABNORMAL HIGH (ref 70–99)
Glucose-Capillary: 127 mg/dL — ABNORMAL HIGH (ref 70–99)
Glucose-Capillary: 128 mg/dL — ABNORMAL HIGH (ref 70–99)
Glucose-Capillary: 131 mg/dL — ABNORMAL HIGH (ref 70–99)
Glucose-Capillary: 143 mg/dL — ABNORMAL HIGH (ref 70–99)
Glucose-Capillary: 156 mg/dL — ABNORMAL HIGH (ref 70–99)

## 2023-05-05 MED ORDER — MELATONIN 3 MG PO TABS
3.0000 mg | ORAL_TABLET | Freq: Every day | ORAL | Status: DC
  Administered 2023-05-05 – 2023-05-09 (×5): 3 mg
  Filled 2023-05-05 (×5): qty 1

## 2023-05-05 MED ORDER — BUPROPION HCL 75 MG PO TABS
75.0000 mg | ORAL_TABLET | Freq: Every morning | ORAL | Status: DC
  Administered 2023-05-06 – 2023-05-09 (×4): 75 mg
  Filled 2023-05-05 (×4): qty 1
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
## 2023-05-05 NOTE — Plan of Care (Signed)
   Problem: Activity: Goal: Risk for activity intolerance will decrease Outcome: Progressing   Problem: Nutrition: Goal: Adequate nutrition will be maintained Outcome: Progressing   Problem: Coping: Goal: Level of anxiety will decrease Outcome: Progressing
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
## 2023-05-06 ENCOUNTER — Encounter: Payer: Self-pay | Admitting: *Deleted

## 2023-05-06 DIAGNOSIS — R6510 Systemic inflammatory response syndrome (SIRS) of non-infectious origin without acute organ dysfunction: Secondary | ICD-10-CM | POA: Diagnosis not present

## 2023-05-06 LAB — GLUCOSE, CAPILLARY
Glucose-Capillary: 107 mg/dL — ABNORMAL HIGH (ref 70–99)
Glucose-Capillary: 114 mg/dL — ABNORMAL HIGH (ref 70–99)
Glucose-Capillary: 125 mg/dL — ABNORMAL HIGH (ref 70–99)
Glucose-Capillary: 127 mg/dL — ABNORMAL HIGH (ref 70–99)
Glucose-Capillary: 129 mg/dL — ABNORMAL HIGH (ref 70–99)
Glucose-Capillary: 132 mg/dL — ABNORMAL HIGH (ref 70–99)
Glucose-Capillary: 89 mg/dL (ref 70–99)
Glucose-Capillary: 96 mg/dL (ref 70–99)
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
## 2023-05-06 NOTE — Plan of Care (Signed)
  Problem: Coping: Goal: Level of anxiety will decrease Outcome: Progressing   Problem: Nutrition: Goal: Adequate nutrition will be maintained Outcome: Progressing   
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
## 2023-05-07 DIAGNOSIS — R410 Disorientation, unspecified: Secondary | ICD-10-CM

## 2023-05-07 DIAGNOSIS — R6510 Systemic inflammatory response syndrome (SIRS) of non-infectious origin without acute organ dysfunction: Secondary | ICD-10-CM | POA: Diagnosis not present

## 2023-05-07 DIAGNOSIS — F331 Major depressive disorder, recurrent, moderate: Secondary | ICD-10-CM

## 2023-05-07 LAB — CBC WITH DIFFERENTIAL/PLATELET
Abs Immature Granulocytes: 0.04 10*3/uL (ref 0.00–0.07)
Basophils Absolute: 0.1 10*3/uL (ref 0.0–0.1)
Basophils Relative: 1 %
Eosinophils Absolute: 0 10*3/uL (ref 0.0–0.5)
Eosinophils Relative: 0 %
HCT: 33.6 % — ABNORMAL LOW (ref 36.0–46.0)
Hemoglobin: 10.6 g/dL — ABNORMAL LOW (ref 12.0–15.0)
Immature Granulocytes: 0 %
Lymphocytes Relative: 20 %
Lymphs Abs: 1.8 10*3/uL (ref 0.7–4.0)
MCH: 29.5 pg (ref 26.0–34.0)
MCHC: 31.5 g/dL (ref 30.0–36.0)
MCV: 93.6 fL (ref 80.0–100.0)
Monocytes Absolute: 0.6 10*3/uL (ref 0.1–1.0)
Monocytes Relative: 7 %
Neutro Abs: 6.7 10*3/uL (ref 1.7–7.7)
Neutrophils Relative %: 72 %
Platelets: 476 10*3/uL — ABNORMAL HIGH (ref 150–400)
RBC: 3.59 MIL/uL — ABNORMAL LOW (ref 3.87–5.11)
RDW: 15 % (ref 11.5–15.5)
WBC: 9.3 10*3/uL (ref 4.0–10.5)
nRBC: 0 % (ref 0.0–0.2)

## 2023-05-07 LAB — GLUCOSE, CAPILLARY
Glucose-Capillary: 110 mg/dL — ABNORMAL HIGH (ref 70–99)
Glucose-Capillary: 130 mg/dL — ABNORMAL HIGH (ref 70–99)
Glucose-Capillary: 149 mg/dL — ABNORMAL HIGH (ref 70–99)
Glucose-Capillary: 147 mg/dL — ABNORMAL HIGH (ref 70–99)
Glucose-Capillary: 106 mg/dL — ABNORMAL HIGH (ref 70–99)
Glucose-Capillary: 176 mg/dL — ABNORMAL HIGH (ref 70–99)

## 2023-05-07 LAB — MAGNESIUM: Magnesium: 2.1 mg/dL (ref 1.7–2.4)

## 2023-05-07 LAB — BASIC METABOLIC PANEL
Anion gap: 11 (ref 5–15)
BUN: 30 mg/dL — ABNORMAL HIGH (ref 8–23)
CO2: 22 mmol/L (ref 22–32)
Calcium: 9.1 mg/dL (ref 8.9–10.3)
Chloride: 107 mmol/L (ref 98–111)
Creatinine, Ser: 0.58 mg/dL (ref 0.44–1.00)
GFR, Estimated: >60 mL/min (ref >60)
Glucose, Bld: 156 mg/dL — ABNORMAL HIGH (ref 70–99)
Potassium: 4.2 mmol/L (ref 3.5–5.1)
Sodium: 140 mmol/L (ref 135–145)
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
## 2023-05-07 NOTE — Plan of Care (Signed)
   Problem: Education: Goal: Knowledge of General Education information will improve Description: Including pain rating scale, medication(s)/side effects and non-pharmacologic comfort measures Outcome: Progressing   Problem: Health Behavior/Discharge Planning: Goal: Ability to manage health-related needs will improve Outcome: Progressing   Problem: Clinical Measurements: Goal: Ability to maintain clinical measurements within normal limits will improve Outcome: Progressing Goal: Will remain free from infection Outcome: Progressing Goal: Diagnostic test results will improve Outcome: Progressing Goal: Respiratory complications will improve Outcome: Progressing Goal: Cardiovascular complication will be avoided Outcome: Progressing   Problem: Nutrition: Goal: Adequate nutrition will be maintained Outcome: Progressing   Problem: Activity: Goal: Risk for activity intolerance will decrease Outcome: Progressing

## 2023-05-07 NOTE — Plan of Care (Signed)
  Problem: Nutritional: Goal: Maintenance of adequate nutrition will improve Outcome: Progressing Goal: Progress toward achieving an optimal weight will improve Outcome: Progressing   Problem: Skin Integrity: Goal: Risk for impaired skin integrity will decrease Outcome: Progressing   Problem: Tissue Perfusion: Goal: Adequacy of tissue perfusion will improve Outcome: Progressing

## 2023-05-08 DIAGNOSIS — R6510 Systemic inflammatory response syndrome (SIRS) of non-infectious origin without acute organ dysfunction: Secondary | ICD-10-CM | POA: Diagnosis not present

## 2023-05-08 LAB — GLUCOSE, CAPILLARY
Glucose-Capillary: 128 mg/dL — ABNORMAL HIGH (ref 70–99)
Glucose-Capillary: 132 mg/dL — ABNORMAL HIGH (ref 70–99)
Glucose-Capillary: 146 mg/dL — ABNORMAL HIGH (ref 70–99)
Glucose-Capillary: 147 mg/dL — ABNORMAL HIGH (ref 70–99)
Glucose-Capillary: 164 mg/dL — ABNORMAL HIGH (ref 70–99)
Glucose-Capillary: 92 mg/dL (ref 70–99)
Glucose-Capillary: 92 mg/dL (ref 70–99)
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
## 2023-05-08 NOTE — Plan of Care (Signed)

## 2023-05-08 NOTE — Plan of Care (Signed)
   Problem: Clinical Measurements: Goal: Will remain free from infection Outcome: Progressing   Problem: Clinical Measurements: Goal: Diagnostic test results will improve Outcome: Progressing   Problem: Clinical Measurements: Goal: Respiratory complications will improve Outcome: Progressing

## 2023-05-09 ENCOUNTER — Encounter: Payer: Self-pay | Admitting: Neurosurgery

## 2023-05-09 LAB — GLUCOSE, CAPILLARY
Glucose-Capillary: 120 mg/dL — ABNORMAL HIGH (ref 70–99)
Glucose-Capillary: 127 mg/dL — ABNORMAL HIGH (ref 70–99)
Glucose-Capillary: 135 mg/dL — ABNORMAL HIGH (ref 70–99)
Glucose-Capillary: 137 mg/dL — ABNORMAL HIGH (ref 70–99)
Glucose-Capillary: 175 mg/dL — ABNORMAL HIGH (ref 70–99)
Glucose-Capillary: 97 mg/dL (ref 70–99)

## 2023-05-09 MED ORDER — OYSTER SHELL CALCIUM/D3 500-5 MG-MCG PO TABS
1.0000 | ORAL_TABLET | Freq: Three times a day (TID) | ORAL | Status: DC
  Administered 2023-05-09 – 2023-06-09 (×90): 1 via ORAL
  Filled 2023-05-09 (×93): qty 1

## 2023-05-09 MED ORDER — VITAMIN B-12 1000 MCG PO TABS
1000.0000 ug | ORAL_TABLET | Freq: Every day | ORAL | Status: DC
  Administered 2023-05-10 – 2023-06-09 (×30): 1000 ug via ORAL
  Filled 2023-05-09 (×30): qty 1

## 2023-05-09 MED ORDER — MIRTAZAPINE 15 MG PO TABS
7.5000 mg | ORAL_TABLET | Freq: Every day | ORAL | Status: DC
  Administered 2023-05-09 – 2023-06-08 (×31): 7.5 mg via ORAL
  Filled 2023-05-09 (×32): qty 1

## 2023-05-09 MED ORDER — PRAVASTATIN SODIUM 40 MG PO TABS
40.0000 mg | ORAL_TABLET | Freq: Every day | ORAL | Status: DC
  Administered 2023-05-10 – 2023-06-08 (×29): 40 mg via ORAL
  Filled 2023-05-09 (×29): qty 1

## 2023-05-09 MED ORDER — FAMOTIDINE 20 MG PO TABS
20.0000 mg | ORAL_TABLET | Freq: Every day | ORAL | Status: DC
  Administered 2023-05-10 – 2023-06-09 (×30): 20 mg via ORAL
  Filled 2023-05-09 (×31): qty 1

## 2023-05-09 MED ORDER — BUPROPION HCL 75 MG PO TABS
75.0000 mg | ORAL_TABLET | Freq: Every morning | ORAL | Status: DC
  Administered 2023-05-10 – 2023-06-09 (×29): 75 mg via ORAL
  Filled 2023-05-09 (×32): qty 1

## 2023-05-09 MED ORDER — OXYCODONE HCL 5 MG PO TABS
2.5000 mg | ORAL_TABLET | ORAL | Status: DC | PRN
  Administered 2023-05-31 – 2023-06-06 (×2): 2.5 mg via ORAL
  Filled 2023-05-09 (×3): qty 1

## 2023-05-09 MED ORDER — MELATONIN 3 MG PO TABS
3.0000 mg | ORAL_TABLET | Freq: Every day | ORAL | Status: DC
  Administered 2023-05-10 – 2023-06-08 (×29): 3 mg via ORAL
  Filled 2023-05-09 (×29): qty 1

## 2023-05-09 MED ORDER — LOSARTAN POTASSIUM 50 MG PO TABS
100.0000 mg | ORAL_TABLET | Freq: Every day | ORAL | Status: DC
  Administered 2023-05-10 – 2023-05-27 (×17): 100 mg via ORAL
  Filled 2023-05-09 (×17): qty 2

## 2023-05-09 MED ORDER — ACETAMINOPHEN 500 MG PO TABS
1000.0000 mg | ORAL_TABLET | Freq: Three times a day (TID) | ORAL | Status: DC
  Administered 2023-05-09 – 2023-05-18 (×25): 1000 mg via ORAL
  Administered 2023-05-19: 500 mg via ORAL
  Administered 2023-05-19 – 2023-06-09 (×62): 1000 mg via ORAL
  Filled 2023-05-09 (×91): qty 2

## 2023-05-09 MED ORDER — ASPIRIN 81 MG PO CHEW
81.0000 mg | CHEWABLE_TABLET | Freq: Every day | ORAL | Status: DC
  Administered 2023-05-10 – 2023-06-09 (×26): 81 mg via ORAL
  Filled 2023-05-09 (×31): qty 1

## 2023-05-09 MED ORDER — SODIUM CHLORIDE 1 G PO TABS
1.0000 g | ORAL_TABLET | Freq: Two times a day (BID) | ORAL | Status: DC
  Administered 2023-05-09 – 2023-05-17 (×16): 1 g via ORAL
  Filled 2023-05-09 (×16): qty 1

## 2023-05-09 MED ORDER — METOPROLOL TARTRATE 50 MG PO TABS
100.0000 mg | ORAL_TABLET | Freq: Two times a day (BID) | ORAL | Status: DC
  Administered 2023-05-09 – 2023-05-27 (×32): 100 mg via ORAL
  Filled 2023-05-09 (×35): qty 2
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
## 2023-05-10 ENCOUNTER — Encounter: Payer: Self-pay | Admitting: Neurosurgery

## 2023-05-10 ENCOUNTER — Encounter: Payer: Medicare Other | Admitting: Neurosurgery

## 2023-05-10 LAB — GLUCOSE, CAPILLARY
Glucose-Capillary: 124 mg/dL — ABNORMAL HIGH (ref 70–99)
Glucose-Capillary: 133 mg/dL — ABNORMAL HIGH (ref 70–99)
Glucose-Capillary: 145 mg/dL — ABNORMAL HIGH (ref 70–99)
Glucose-Capillary: 150 mg/dL — ABNORMAL HIGH (ref 70–99)
Glucose-Capillary: 159 mg/dL — ABNORMAL HIGH (ref 70–99)
Glucose-Capillary: 80 mg/dL (ref 70–99)
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
## 2023-05-10 NOTE — Progress Notes (Signed)
 Calorie Count Note  Per nursing patient did not eat much yesterday and was only able to meet 28% of her calorie and 33% of her protein needs. This morning they report patient did very well and had 75% of breakfast  and 1 Magic cup. RD to continue calorie calorie count until tomorrow.   48 hour calorie count ordered.  Diet: Dysphagia 2, thin liquids  Supplements: Ensure Enlive, Magic cup  3/24: Breakfast: 25% sausage, 50 kcal, 2 gm protein Lunch: <25% of her meal 50 kcal, 3 gm protein  Dinner: No meal ticket  Supplements: 1 Ensure (350 kcal, 20 gm protein)  Total intake: 450 kcal (28% of minimum estimated needs)  25 gm protein (33% of minimum estimated needs)  Estimated Nutritional Needs:  Kcal:  1600-1800 kcal Protein:  75-90 gm Fluid:  >1.6L/day  INTERVENTION:    Continue Nocturnal tube feeding via Cortrak: Vital 1.5 at 80 ml/h (960 ml per day) 6pm-6am Prosource TF20 60 ml daily 100 FWF Q6H   Provides 1520 kcal, 84 gm protein, 753 ml free water daily (1153 ml water daily TF+ FWF)   Continue calorie count  Continue Banatrol BID per tube  Encourage PO intake with feeding assistance  Ensure Enlive po BID, each supplement provides 350 kcal and 20 grams of protein Magic cup TID with meals, each supplement provides 290 kcal and 9 grams of protein Monitor diet advancement  Recommend PEG tube placement if within GOC    NUTRITION DIAGNOSIS:    Severe Malnutrition related to chronic illness as evidenced by severe muscle depletion, severe fat depletion.   - Still applicable    GOAL:    Patient will meet greater than or equal to 90% of their needs   Meeting via TF's  Elliot Dally, RD Registered Dietitian  See Amion for more information
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
## 2023-05-10 NOTE — Plan of Care (Signed)

## 2023-05-11 ENCOUNTER — Encounter: Admitting: Registered Nurse

## 2023-05-11 LAB — COMPREHENSIVE METABOLIC PANEL
ALT: 19 U/L (ref 0–44)
AST: 17 U/L (ref 15–41)
Albumin: 3.3 g/dL — ABNORMAL LOW (ref 3.5–5.0)
Alkaline Phosphatase: 98 U/L (ref 38–126)
Anion gap: 10 (ref 5–15)
BUN: 27 mg/dL — ABNORMAL HIGH (ref 8–23)
CO2: 21 mmol/L — ABNORMAL LOW (ref 22–32)
Calcium: 9.1 mg/dL (ref 8.9–10.3)
Chloride: 105 mmol/L (ref 98–111)
Creatinine, Ser: 0.55 mg/dL (ref 0.44–1.00)
GFR, Estimated: >60 mL/min (ref >60)
Glucose, Bld: 102 mg/dL — ABNORMAL HIGH (ref 70–99)
Potassium: 4.5 mmol/L (ref 3.5–5.1)
Sodium: 136 mmol/L (ref 135–145)
Total Bilirubin: 0.8 mg/dL (ref 0.0–1.2)
Total Protein: 6.2 g/dL — ABNORMAL LOW (ref 6.5–8.1)

## 2023-05-11 LAB — GLUCOSE, CAPILLARY
Glucose-Capillary: 136 mg/dL — ABNORMAL HIGH (ref 70–99)
Glucose-Capillary: 112 mg/dL — ABNORMAL HIGH (ref 70–99)
Glucose-Capillary: 144 mg/dL — ABNORMAL HIGH (ref 70–99)
Glucose-Capillary: 162 mg/dL — ABNORMAL HIGH (ref 70–99)

## 2023-05-11 LAB — PHOSPHORUS: Phosphorus: 4.1 mg/dL (ref 2.5–4.6)

## 2023-05-11 LAB — MAGNESIUM: Magnesium: 2 mg/dL (ref 1.7–2.4)

## 2023-05-11 MED ORDER — BANATROL TF EN LIQD
60.0000 mL | Freq: Three times a day (TID) | ENTERAL | Status: DC
  Administered 2023-05-11 – 2023-05-17 (×20): 60 mL
  Filled 2023-05-11 (×20): qty 60

## 2023-05-11 NOTE — TOC Progression Note (Signed)
 Transition of Care [facility]) - Progression Note    Patient Details  Name: Rita Taylor MRN: [id number] Date of Birth: Jun 09, 1940  Transition of [facility] CM/SW Contact  Janae Bridgeman, RN Phone Number: 05/11/2023, 4:31 PM  Clinical Narrative:    CM and MSW with [facility] will continue to follow the patient for placement needs.  Palliative Care Team continues to have discussions regarding the patient's GOC needs.  Patient currently has a Cortrak and remains inpatient and unable to place in nursing facility until PEG is placed or decision is made by the family regarding this matter.   Expected Discharge Plan: Skilled Nursing Facility    Expected Discharge Plan and Services                                               Social Determinants of Health (SDOH) Interventions SDOH Screenings   Food Insecurity: No Food Insecurity (04/27/2023)  Housing: Low Risk  (04/27/2023)  Transportation Needs: No Transportation Needs (04/27/2023)  Utilities: Not At Risk (04/27/2023)  Depression (PHQ2-9): Low Risk  (11/29/2022)  Financial Resource Strain: Low Risk  (06/03/2022)  Physical Activity: Unknown (06/03/2022)  Social Connections: Moderately Isolated (04/27/2023)  Stress: No Stress Concern Present (06/03/2022)  Tobacco Use: Low Risk  (04/27/2023)    Readmission Risk Interventions     No data to display
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
## 2023-05-11 NOTE — Plan of Care (Signed)
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
## 2023-05-11 NOTE — Plan of Care (Signed)
   Problem: Education: Goal: Knowledge of General Education information will improve Description: Including pain rating scale, medication(s)/side effects and non-pharmacologic comfort measures Outcome: Not Progressing   Problem: Health Behavior/Discharge Planning: Goal: Ability to manage health-related needs will improve Outcome: Not Progressing   Problem: Clinical Measurements: Goal: Ability to maintain clinical measurements within normal limits will improve Outcome: Progressing
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
## 2023-05-12 LAB — COMPREHENSIVE METABOLIC PANEL WITH GFR
ALT: 17 U/L (ref 0–44)
AST: 19 U/L (ref 15–41)
Albumin: 3.1 g/dL — ABNORMAL LOW (ref 3.5–5.0)
Alkaline Phosphatase: 104 U/L (ref 38–126)
Anion gap: 9 (ref 5–15)
BUN: 32 mg/dL — ABNORMAL HIGH (ref 8–23)
CO2: 24 mmol/L (ref 22–32)
Calcium: 8.9 mg/dL (ref 8.9–10.3)
Chloride: 103 mmol/L (ref 98–111)
Creatinine, Ser: 0.6 mg/dL (ref 0.44–1.00)
GFR, Estimated: >60 mL/min (ref >60)
Glucose, Bld: 144 mg/dL — ABNORMAL HIGH (ref 70–99)
Potassium: 4.8 mmol/L (ref 3.5–5.1)
Sodium: 136 mmol/L (ref 135–145)
Total Bilirubin: 0.5 mg/dL (ref 0.0–1.2)
Total Protein: 6 g/dL — ABNORMAL LOW (ref 6.5–8.1)

## 2023-05-12 LAB — GLUCOSE, CAPILLARY
Glucose-Capillary: 115 mg/dL — ABNORMAL HIGH (ref 70–99)
Glucose-Capillary: 116 mg/dL — ABNORMAL HIGH (ref 70–99)
Glucose-Capillary: 124 mg/dL — ABNORMAL HIGH (ref 70–99)
Glucose-Capillary: 124 mg/dL — ABNORMAL HIGH (ref 70–99)
Glucose-Capillary: 147 mg/dL — ABNORMAL HIGH (ref 70–99)
Glucose-Capillary: 150 mg/dL — ABNORMAL HIGH (ref 70–99)
Glucose-Capillary: 98 mg/dL (ref 70–99)

## 2023-05-12 LAB — PHOSPHORUS: Phosphorus: 4.7 mg/dL — ABNORMAL HIGH (ref 2.5–4.6)

## 2023-05-12 LAB — MAGNESIUM: Magnesium: 2.1 mg/dL (ref 1.7–2.4)

## 2023-05-12 MED ORDER — SODIUM CHLORIDE 0.45 % IV SOLN
INTRAVENOUS | Status: AC
Start: 2023-05-12 — End: 2023-05-13
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
## 2023-05-12 NOTE — Plan of Care (Signed)

## 2023-05-12 NOTE — Plan of Care (Signed)
   Problem: Activity: Goal: Risk for activity intolerance will decrease Outcome: Progressing   Problem: Nutrition: Goal: Adequate nutrition will be maintained Outcome: Progressing   Problem: Safety: Goal: Ability to remain free from injury will improve Outcome: Progressing

## 2023-05-13 LAB — COMPREHENSIVE METABOLIC PANEL WITH GFR
ALT: 18 U/L (ref 0–44)
AST: 18 U/L (ref 15–41)
Albumin: 3.3 g/dL — ABNORMAL LOW (ref 3.5–5.0)
Alkaline Phosphatase: 96 U/L (ref 38–126)
Anion gap: 10 (ref 5–15)
BUN: 27 mg/dL — ABNORMAL HIGH (ref 8–23)
CO2: 25 mmol/L (ref 22–32)
Calcium: 9 mg/dL (ref 8.9–10.3)
Chloride: 103 mmol/L (ref 98–111)
Creatinine, Ser: 0.55 mg/dL (ref 0.44–1.00)
GFR, Estimated: >60 mL/min (ref >60)
Glucose, Bld: 112 mg/dL — ABNORMAL HIGH (ref 70–99)
Potassium: 4.6 mmol/L (ref 3.5–5.1)
Sodium: 138 mmol/L (ref 135–145)
Total Bilirubin: 0.4 mg/dL (ref 0.0–1.2)
Total Protein: 6.2 g/dL — ABNORMAL LOW (ref 6.5–8.1)

## 2023-05-13 LAB — GLUCOSE, CAPILLARY
Glucose-Capillary: 146 mg/dL — ABNORMAL HIGH (ref 70–99)
Glucose-Capillary: 111 mg/dL — ABNORMAL HIGH (ref 70–99)
Glucose-Capillary: 175 mg/dL — ABNORMAL HIGH (ref 70–99)
Glucose-Capillary: 90 mg/dL (ref 70–99)
Glucose-Capillary: 149 mg/dL — ABNORMAL HIGH (ref 70–99)

## 2023-05-13 LAB — PHOSPHORUS: Phosphorus: 3.8 mg/dL (ref 2.5–4.6)

## 2023-05-13 LAB — MAGNESIUM: Magnesium: 2.1 mg/dL (ref 1.7–2.4)
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
## 2023-05-13 NOTE — Plan of Care (Signed)
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
## 2023-05-13 NOTE — Progress Notes (Signed)
 IR request for G tube placement.   Tentative approval by Dr. Donne Hazel. CT abdomen recommended WO contrast and ordered.   Please call IR with questions/concerns.

## 2023-05-14 ENCOUNTER — Inpatient Hospital Stay (HOSPITAL_COMMUNITY)

## 2023-05-14 LAB — GLUCOSE, CAPILLARY
Glucose-Capillary: 101 mg/dL — ABNORMAL HIGH (ref 70–99)
Glucose-Capillary: 103 mg/dL — ABNORMAL HIGH (ref 70–99)
Glucose-Capillary: 106 mg/dL — ABNORMAL HIGH (ref 70–99)
Glucose-Capillary: 110 mg/dL — ABNORMAL HIGH (ref 70–99)
Glucose-Capillary: 138 mg/dL — ABNORMAL HIGH (ref 70–99)
Glucose-Capillary: 87 mg/dL (ref 70–99)

## 2023-05-14 LAB — COMPREHENSIVE METABOLIC PANEL WITH GFR
ALT: 17 U/L (ref 0–44)
AST: 17 U/L (ref 15–41)
Albumin: 3.2 g/dL — ABNORMAL LOW (ref 3.5–5.0)
Alkaline Phosphatase: 95 U/L (ref 38–126)
Anion gap: 11 (ref 5–15)
BUN: 21 mg/dL (ref 8–23)
CO2: 23 mmol/L (ref 22–32)
Calcium: 9.2 mg/dL (ref 8.9–10.3)
Chloride: 102 mmol/L (ref 98–111)
Creatinine, Ser: 0.63 mg/dL (ref 0.44–1.00)
GFR, Estimated: >60 mL/min (ref >60)
Glucose, Bld: 160 mg/dL — ABNORMAL HIGH (ref 70–99)
Potassium: 4.2 mmol/L (ref 3.5–5.1)
Sodium: 136 mmol/L (ref 135–145)
Total Bilirubin: 0.4 mg/dL (ref 0.0–1.2)
Total Protein: 6.1 g/dL — ABNORMAL LOW (ref 6.5–8.1)

## 2023-05-14 LAB — MAGNESIUM: Magnesium: 2 mg/dL (ref 1.7–2.4)

## 2023-05-14 LAB — PHOSPHORUS: Phosphorus: 3.6 mg/dL (ref 2.5–4.6)

## 2023-05-14 NOTE — Plan of Care (Signed)
 Patient alert to self. Cortrak in place, vital 1.5 feeding started at 6pm. C-collar in place. LBM today. Enteric precautions in place. Safety precautions maintained.     Problem: Clinical Measurements: Goal: Will remain free from infection Outcome: Progressing   Problem: Activity: Goal: Risk for activity intolerance will decrease Outcome: Progressing   Problem: Nutrition: Goal: Adequate nutrition will be maintained Outcome: Progressing   Problem: Education: Goal: Knowledge of General Education information will improve Description: Including pain rating scale, medication(s)/side effects and non-pharmacologic comfort measures Outcome: Not Progressing   Problem: Health Behavior/Discharge Planning: Goal: Ability to manage health-related needs will improve Outcome: Not Progressing
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
## 2023-05-15 LAB — GLUCOSE, CAPILLARY
Glucose-Capillary: 106 mg/dL — ABNORMAL HIGH (ref 70–99)
Glucose-Capillary: 113 mg/dL — ABNORMAL HIGH (ref 70–99)
Glucose-Capillary: 119 mg/dL — ABNORMAL HIGH (ref 70–99)
Glucose-Capillary: 134 mg/dL — ABNORMAL HIGH (ref 70–99)
Glucose-Capillary: 141 mg/dL — ABNORMAL HIGH (ref 70–99)
Glucose-Capillary: 156 mg/dL — ABNORMAL HIGH (ref 70–99)
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
## 2023-05-15 NOTE — Plan of Care (Signed)
     Referral previously received for Rita Taylor. Petrey for goals of care discussion. Chart reviewed and updates received from attending team. See last PMT note dated 05/13/2023.  Chart reviewed and no significant clinical changes or ongoing palliative needs noted. At this time goals are clear. We will sign off for now and discontinue consult order. Please contact us and enter a new consult order for any new palliative care needs.  Thank you for your referral and allowing PMT to assist in Rita Taylor's care.   Sarina Ser, NP Palliative Medicine Team Phone: [phone]  NO CHARGE

## 2023-05-16 LAB — GLUCOSE, CAPILLARY
Glucose-Capillary: 103 mg/dL — ABNORMAL HIGH (ref 70–99)
Glucose-Capillary: 135 mg/dL — ABNORMAL HIGH (ref 70–99)
Glucose-Capillary: 136 mg/dL — ABNORMAL HIGH (ref 70–99)
Glucose-Capillary: 157 mg/dL — ABNORMAL HIGH (ref 70–99)
Glucose-Capillary: 170 mg/dL — ABNORMAL HIGH (ref 70–99)
Glucose-Capillary: 180 mg/dL — ABNORMAL HIGH (ref 70–99)
Glucose-Capillary: 92 mg/dL (ref 70–99)
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
## 2023-05-16 NOTE — Plan of Care (Signed)
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
## 2023-05-17 LAB — COMPREHENSIVE METABOLIC PANEL WITH GFR
ALT: 15 U/L (ref 0–44)
AST: 14 U/L — ABNORMAL LOW (ref 15–41)
Albumin: 3.1 g/dL — ABNORMAL LOW (ref 3.5–5.0)
Alkaline Phosphatase: 98 U/L (ref 38–126)
Anion gap: 13 (ref 5–15)
BUN: 22 mg/dL (ref 8–23)
CO2: 24 mmol/L (ref 22–32)
Calcium: 9.2 mg/dL (ref 8.9–10.3)
Chloride: 100 mmol/L (ref 98–111)
Creatinine, Ser: 0.52 mg/dL (ref 0.44–1.00)
GFR, Estimated: >60 mL/min (ref >60)
Glucose, Bld: 117 mg/dL — ABNORMAL HIGH (ref 70–99)
Potassium: 4.2 mmol/L (ref 3.5–5.1)
Sodium: 137 mmol/L (ref 135–145)
Total Bilirubin: 0.5 mg/dL (ref 0.0–1.2)
Total Protein: 5.9 g/dL — ABNORMAL LOW (ref 6.5–8.1)

## 2023-05-17 LAB — CBC
HCT: 35.6 % — ABNORMAL LOW (ref 36.0–46.0)
Hemoglobin: 11.3 g/dL — ABNORMAL LOW (ref 12.0–15.0)
MCH: 29.7 pg (ref 26.0–34.0)
MCHC: 31.7 g/dL (ref 30.0–36.0)
MCV: 93.7 fL (ref 80.0–100.0)
Platelets: 392 10*3/uL (ref 150–400)
RBC: 3.8 MIL/uL — ABNORMAL LOW (ref 3.87–5.11)
RDW: 14.5 % (ref 11.5–15.5)
WBC: 12.1 10*3/uL — ABNORMAL HIGH (ref 4.0–10.5)
nRBC: 0 % (ref 0.0–0.2)

## 2023-05-17 LAB — GLUCOSE, CAPILLARY
Glucose-Capillary: 113 mg/dL — ABNORMAL HIGH (ref 70–99)
Glucose-Capillary: 78 mg/dL (ref 70–99)
Glucose-Capillary: 79 mg/dL (ref 70–99)
Glucose-Capillary: 102 mg/dL — ABNORMAL HIGH (ref 70–99)
Glucose-Capillary: 148 mg/dL — ABNORMAL HIGH (ref 70–99)

## 2023-05-17 LAB — PROTIME-INR
INR: 0.9 (ref 0.8–1.2)
Prothrombin Time: 12.4 s (ref 11.4–15.2)

## 2023-05-17 MED ORDER — PANCRELIPASE (LIP-PROT-AMYL) 10440-39150 UNITS PO TABS
20880.0000 [IU] | ORAL_TABLET | Freq: Once | ORAL | Status: AC
  Administered 2023-05-17: 20880 [IU]
  Filled 2023-05-17: qty 2

## 2023-05-17 MED ORDER — SODIUM BICARBONATE 650 MG PO TABS
650.0000 mg | ORAL_TABLET | Freq: Once | ORAL | Status: AC
  Administered 2023-05-17: 650 mg
  Filled 2023-05-17: qty 1
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
## 2023-05-17 NOTE — Plan of Care (Signed)

## 2023-05-18 ENCOUNTER — Other Ambulatory Visit: Payer: Self-pay | Admitting: *Deleted

## 2023-05-18 ENCOUNTER — Inpatient Hospital Stay (HOSPITAL_COMMUNITY)

## 2023-05-18 HISTORY — PX: IR GASTROSTOMY TUBE MOD SED: IMG625

## 2023-05-18 LAB — CREATININE, SERUM
Creatinine, Ser: 0.67 mg/dL (ref 0.44–1.00)
GFR, Estimated: >60 mL/min (ref >60)

## 2023-05-18 LAB — GLUCOSE, CAPILLARY
Glucose-Capillary: 119 mg/dL — ABNORMAL HIGH (ref 70–99)
Glucose-Capillary: 122 mg/dL — ABNORMAL HIGH (ref 70–99)
Glucose-Capillary: 70 mg/dL (ref 70–99)
Glucose-Capillary: 73 mg/dL (ref 70–99)
Glucose-Capillary: 79 mg/dL (ref 70–99)

## 2023-05-18 MED ORDER — CEFAZOLIN SODIUM-DEXTROSE 2-4 GM/100ML-% IV SOLN
INTRAVENOUS | Status: AC
  Filled 2023-05-18: qty 100

## 2023-05-18 MED ORDER — FENTANYL CITRATE (PF) 100 MCG/2ML IJ SOLN
INTRAMUSCULAR | Status: AC | PRN
  Administered 2023-05-18: 25 ug via INTRAVENOUS
  Administered 2023-05-18: 50 ug via INTRAVENOUS

## 2023-05-18 MED ORDER — FENTANYL CITRATE (PF) 100 MCG/2ML IJ SOLN
INTRAMUSCULAR | Status: AC
  Filled 2023-05-18: qty 2

## 2023-05-18 MED ORDER — IOHEXOL 300 MG/ML  SOLN
50.0000 mL | Freq: Once | INTRAMUSCULAR | Status: AC | PRN
  Administered 2023-05-18: 15 mL

## 2023-05-18 MED ORDER — LIDOCAINE HCL 1 % IJ SOLN
20.0000 mL | Freq: Once | INTRAMUSCULAR | Status: DC
  Filled 2023-05-18: qty 20

## 2023-05-18 MED ORDER — MIDAZOLAM HCL 2 MG/2ML IJ SOLN
INTRAMUSCULAR | Status: AC | PRN
  Administered 2023-05-18 (×2): .5 mg via INTRAVENOUS

## 2023-05-18 MED ORDER — LIDOCAINE-EPINEPHRINE 1 %-1:100000 IJ SOLN
INTRAMUSCULAR | Status: AC
  Filled 2023-05-18: qty 1

## 2023-05-18 MED ORDER — LIDOCAINE-EPINEPHRINE 1 %-1:100000 IJ SOLN
20.0000 mL | Freq: Once | INTRAMUSCULAR | Status: AC
  Administered 2023-05-18: 10 mL via INTRADERMAL
  Filled 2023-05-18 (×2): qty 20

## 2023-05-18 MED ORDER — DEXTROSE 5 % IV SOLN: INTRAVENOUS | Status: AC

## 2023-05-18 MED ORDER — MIDAZOLAM HCL 2 MG/2ML IJ SOLN
INTRAMUSCULAR | Status: AC
  Filled 2023-05-18: qty 2

## 2023-05-18 MED ORDER — JEVITY 1.5 CAL/FIBER PO LIQD
1000.0000 mL | ORAL | Status: DC
  Administered 2023-05-18 – 2023-05-19 (×2): 1000 mL
  Filled 2023-05-18 (×3): qty 1000

## 2023-05-18 MED ORDER — CEFAZOLIN (ANCEF) 1 G IV SOLR
INTRAVENOUS | Status: AC | PRN
  Administered 2023-05-18: 2 g
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
## 2023-05-18 NOTE — Procedures (Signed)
 Interventional Radiology Procedure Note  Procedure: Placement of percutaneous 25F pull-through gastrostomy tube. Complications: None Recommendations: - OK to use in 4 hours - routine wound care - routine gastrostomy care  Signed,  Yvone Neu. Loreta Ave, DO, ABVM, RPVI

## 2023-05-18 NOTE — Plan of Care (Signed)
  Problem: Clinical Measurements: Goal: Ability to maintain clinical measurements within normal limits will improve Outcome: Progressing Goal: Will remain free from infection Outcome: Progressing Goal: Diagnostic test results will improve Outcome: Progressing Goal: Respiratory complications will improve Outcome: Progressing Goal: Cardiovascular complication will be avoided Outcome: Progressing   Problem: Nutrition: Goal: Adequate nutrition will be maintained Outcome: Progressing Problem: Coping: Goal: Level of anxiety will decrease Outcome: Progressing   Problem: Elimination: Goal: Will not experience complications related to bowel motility Outcome: Progressing Goal: Will not experience complications related to urinary retention Outcome: Progressing   Problem: Pain Managment: Goal: General experience of comfort will improve and/or be controlled Outcome: Progressing   Problem: Skin Integrity: Goal: Risk for impaired skin integrity will decrease Outcome: Progressing   Problem: Safety: Goal: Ability to remain free from injury will improve Outcome: Progressing   Problem: Metabolic: Goal: Ability to maintain appropriate glucose levels will improve Outcome: Progressing   Problem: Nutritional: Goal: Maintenance of adequate nutrition will improve Outcome: Progressing Goal: Progress toward achieving an optimal weight will improve Outcome: Progressing
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
## 2023-05-18 NOTE — Telephone Encounter (Signed)
 In reviewing patients chart she has been readmitted since [facility] March. Please advise if we need to discontinue Prolia injections at this time. She is not due until 06/03/23.
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
## 2023-05-19 LAB — CBC WITH DIFFERENTIAL/PLATELET
Abs Immature Granulocytes: 0.05 10*3/uL (ref 0.00–0.07)
Basophils Absolute: 0.1 10*3/uL (ref 0.0–0.1)
Basophils Relative: 1 %
Eosinophils Absolute: 0.1 10*3/uL (ref 0.0–0.5)
Eosinophils Relative: 0 %
HCT: 31.7 % — ABNORMAL LOW (ref 36.0–46.0)
Hemoglobin: 10.4 g/dL — ABNORMAL LOW (ref 12.0–15.0)
Immature Granulocytes: 0 %
Lymphocytes Relative: 17 %
Lymphs Abs: 1.8 10*3/uL (ref 0.7–4.0)
MCH: 30.1 pg (ref 26.0–34.0)
MCHC: 32.8 g/dL (ref 30.0–36.0)
MCV: 91.9 fL (ref 80.0–100.0)
Monocytes Absolute: 0.8 10*3/uL (ref 0.1–1.0)
Monocytes Relative: 7 %
Neutro Abs: 8.3 10*3/uL — ABNORMAL HIGH (ref 1.7–7.7)
Neutrophils Relative %: 75 %
Platelets: 380 10*3/uL (ref 150–400)
RBC: 3.45 MIL/uL — ABNORMAL LOW (ref 3.87–5.11)
RDW: 14.4 % (ref 11.5–15.5)
WBC: 11.1 10*3/uL — ABNORMAL HIGH (ref 4.0–10.5)
nRBC: 0 % (ref 0.0–0.2)

## 2023-05-19 LAB — BASIC METABOLIC PANEL WITH GFR
Anion gap: 12 (ref 5–15)
BUN: 21 mg/dL (ref 8–23)
CO2: 24 mmol/L (ref 22–32)
Calcium: 8.6 mg/dL — ABNORMAL LOW (ref 8.9–10.3)
Chloride: 98 mmol/L (ref 98–111)
Creatinine, Ser: 0.53 mg/dL (ref 0.44–1.00)
GFR, Estimated: >60 mL/min (ref >60)
Glucose, Bld: 142 mg/dL — ABNORMAL HIGH (ref 70–99)
Potassium: 4.2 mmol/L (ref 3.5–5.1)
Sodium: 134 mmol/L — ABNORMAL LOW (ref 135–145)

## 2023-05-19 LAB — GLUCOSE, CAPILLARY
Glucose-Capillary: 139 mg/dL — ABNORMAL HIGH (ref 70–99)
Glucose-Capillary: 135 mg/dL — ABNORMAL HIGH (ref 70–99)
Glucose-Capillary: 124 mg/dL — ABNORMAL HIGH (ref 70–99)
Glucose-Capillary: 174 mg/dL — ABNORMAL HIGH (ref 70–99)
Glucose-Capillary: 160 mg/dL — ABNORMAL HIGH (ref 70–99)

## 2023-05-19 MED ORDER — JEVITY 1.5 CAL/FIBER PO LIQD: 1000.0000 mL | ORAL | Status: DC

## 2023-05-19 MED ORDER — OXYCODONE HCL 5 MG PO TABS: 2.5000 mg | ORAL_TABLET | Freq: Four times a day (QID) | ORAL | Status: DC | PRN

## 2023-05-19 MED ORDER — MELATONIN 3 MG PO TABS: 3.0000 mg | ORAL_TABLET | Freq: Every day | ORAL | Status: DC

## 2023-05-19 MED ORDER — ENSURE ENLIVE PO LIQD: 237.0000 mL | Freq: Two times a day (BID) | ORAL | Status: DC

## 2023-05-19 MED ORDER — FAMOTIDINE 20 MG PO TABS: 20.0000 mg | ORAL_TABLET | Freq: Every day | ORAL | Status: DC

## 2023-05-19 MED ORDER — FREE WATER: 100.0000 mL | Freq: Four times a day (QID) | Status: DC

## 2023-05-19 MED ORDER — METOPROLOL TARTRATE 100 MG PO TABS: 100.0000 mg | ORAL_TABLET | Freq: Two times a day (BID) | ORAL | Status: DC

## 2023-05-19 MED ORDER — PROSOURCE TF20 ENFIT COMPATIBL EN LIQD: 60.0000 mL | Freq: Every day | ENTERAL | Status: DC

## 2023-05-19 MED ORDER — MIRTAZAPINE 7.5 MG PO TABS: 7.5000 mg | ORAL_TABLET | Freq: Every day | ORAL | Status: DC

## 2023-05-19 MED ORDER — INSULIN ASPART 100 UNIT/ML IJ SOLN: 0.0000 [IU] | INTRAMUSCULAR | Status: DC

## 2023-05-19 NOTE — Plan of Care (Signed)
  Problem: Health Behavior/Discharge Planning: Goal: Ability to manage health-related needs will improve Outcome: Progressing   Problem: Clinical Measurements: Goal: Ability to maintain clinical measurements within normal limits will improve Outcome: Progressing   Problem: Safety: Goal: Ability to remain free from injury will improve Outcome: Progressing   Problem: Skin Integrity: Goal: Risk for impaired skin integrity will decrease Outcome: Progressing   
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
## 2023-05-19 NOTE — TOC Progression Note (Addendum)
 Transition of Care [facility]) - Progression Note    Patient Details  Name: Rita Taylor MRN: [id number] Date of Birth: 04/15/1940  Transition of Care [facility]) CM/SW Contact  Stephaun Million A [place], LCSW Phone Number: 05/19/2023, 2:27 PM  Clinical Narrative:     Update 1633- CSW spoke with pt's son, [name], discussed disposition plan for discharge. He stated that he is going to talk with his sister and update CSW on plans for next steps and preference for rehab facility. Informed him of possible DC tomorrow if pt's facility choice had bed available that day.   TOC will continue to follow.   CSW was notified that pt had PEG placed, was medically stable for discharge. Pt has bed offers for completed SNF workup.  CSW reached out to pt's son, Dorene Sorrow regarding pt's update to disposition provided bed offers for SNF. There was no answer, CSW left VM with contact information to reach back out to CSW.    TOC will continue to follow.   Expected Discharge Plan: Skilled Nursing Facility    Expected Discharge Plan and Services                                               Social Determinants of Health (SDOH) Interventions SDOH Screenings   Food Insecurity: No Food Insecurity (04/27/2023)  Housing: Low Risk  (04/27/2023)  Transportation Needs: No Transportation Needs (04/27/2023)  Utilities: Not At Risk (04/27/2023)  Depression (PHQ2-9): Low Risk  (11/29/2022)  Financial Resource Strain: Low Risk  (06/03/2022)  Physical Activity: Unknown (06/03/2022)  Social Connections: Moderately Isolated (04/27/2023)  Stress: No Stress Concern Present (06/03/2022)  Tobacco Use: Low Risk  (04/27/2023)    Readmission Risk Interventions     No data to display

## 2023-05-20 LAB — GLUCOSE, CAPILLARY
Glucose-Capillary: 102 mg/dL — ABNORMAL HIGH (ref 70–99)
Glucose-Capillary: 120 mg/dL — ABNORMAL HIGH (ref 70–99)
Glucose-Capillary: 127 mg/dL — ABNORMAL HIGH (ref 70–99)
Glucose-Capillary: 133 mg/dL — ABNORMAL HIGH (ref 70–99)
Glucose-Capillary: 138 mg/dL — ABNORMAL HIGH (ref 70–99)
Glucose-Capillary: 143 mg/dL — ABNORMAL HIGH (ref 70–99)

## 2023-05-20 MED ORDER — JEVITY 1.5 CAL/FIBER PO LIQD: 1000.0000 mL | ORAL | Status: DC

## 2023-05-20 MED ORDER — JEVITY 1.5 CAL/FIBER PO LIQD
1000.0000 mL | ORAL | Status: DC
  Administered 2023-05-20 – 2023-05-23 (×4): 1000 mL
  Filled 2023-05-20 (×5): qty 1000
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
## 2023-05-20 NOTE — Plan of Care (Signed)
  Problem: Education: Goal: Knowledge of General Education information will improve Description: Including pain rating scale, medication(s)/side effects and non-pharmacologic comfort measures Outcome: Progressing   Problem: Health Behavior/Discharge Planning: Goal: Ability to manage health-related needs will improve Outcome: Progressing   Problem: Clinical Measurements: Goal: Ability to maintain clinical measurements within normal limits will improve Outcome: Progressing Goal: Will remain free from infection Outcome: Progressing Goal: Diagnostic test results will improve Outcome: Progressing Goal: Respiratory complications will improve Outcome: Progressing Goal: Cardiovascular complication will be avoided Outcome: Progressing   Problem: Elimination: Goal: Will not experience complications related to bowel motility Outcome: Progressing Goal: Will not experience complications related to urinary retention Outcome: Progressing   Problem: Pain Managment: Goal: General experience of comfort will improve and/or be controlled Outcome: Progressing   Problem: Skin Integrity: Goal: Risk for impaired skin integrity will decrease Outcome: Progressing   Problem: Education: Goal: Ability to describe self-care measures that may prevent or decrease complications (Diabetes Survival Skills Education) will improve Outcome: Progressing Goal: Individualized Educational Video(s) Outcome: Progressing
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
## 2023-05-21 LAB — GLUCOSE, CAPILLARY
Glucose-Capillary: 112 mg/dL — ABNORMAL HIGH (ref 70–99)
Glucose-Capillary: 114 mg/dL — ABNORMAL HIGH (ref 70–99)
Glucose-Capillary: 119 mg/dL — ABNORMAL HIGH (ref 70–99)
Glucose-Capillary: 120 mg/dL — ABNORMAL HIGH (ref 70–99)
Glucose-Capillary: 124 mg/dL — ABNORMAL HIGH (ref 70–99)
Glucose-Capillary: 164 mg/dL — ABNORMAL HIGH (ref 70–99)

## 2023-05-21 MED ORDER — ARTIFICIAL TEARS OPHTHALMIC OINT
TOPICAL_OINTMENT | OPHTHALMIC | Status: DC | PRN
  Filled 2023-05-21: qty 3.5

## 2023-05-21 NOTE — Plan of Care (Signed)
  Problem: Education: Goal: Knowledge of General Education information will improve Description: Including pain rating scale, medication(s)/side effects and non-pharmacologic comfort measures Outcome: Progressing   Problem: Clinical Measurements: Goal: Ability to maintain clinical measurements within normal limits will improve Outcome: Progressing Goal: Will remain free from infection Outcome: Progressing Goal: Diagnostic test results will improve Outcome: Progressing Goal: Respiratory complications will improve Outcome: Progressing Goal: Cardiovascular complication will be avoided Outcome: Progressing   Problem: Activity: Goal: Risk for activity intolerance will decrease Outcome: Progressing   Problem: Coping: Goal: Level of anxiety will decrease Outcome: Progressing   Problem: Elimination: Goal: Will not experience complications related to bowel motility Outcome: Progressing Goal: Will not experience complications related to urinary retention Outcome: Progressing   Problem: Pain Managment: Goal: General experience of comfort will improve and/or be controlled Outcome: Progressing   Problem: Safety: Goal: Ability to remain free from injury will improve Outcome: Progressing   Problem: Skin Integrity: Goal: Risk for impaired skin integrity will decrease Outcome: Progressing   Problem: Coping: Goal: Ability to adjust to condition or change in health will improve Outcome: Progressing   Problem: Metabolic: Goal: Ability to maintain appropriate glucose levels will improve Outcome: Progressing   Problem: Skin Integrity: Goal: Risk for impaired skin integrity will decrease Outcome: Progressing   Problem: Tissue Perfusion: Goal: Adequacy of tissue perfusion will improve Outcome: Progressing   Problem: Health Behavior/Discharge Planning: Goal: Ability to manage health-related needs will improve Outcome: Not Progressing   Problem: Nutrition: Goal: Adequate  nutrition will be maintained Outcome: Not Progressing   Problem: Education: Goal: Ability to describe self-care measures that may prevent or decrease complications (Diabetes Survival Skills Education) will improve Outcome: Not Progressing Goal: Individualized Educational Video(s) Outcome: Not Progressing   Problem: Fluid Volume: Goal: Ability to maintain a balanced intake and output will improve Outcome: Not Progressing   Problem: Health Behavior/Discharge Planning: Goal: Ability to identify and utilize available resources and services will improve Outcome: Not Progressing Goal: Ability to manage health-related needs will improve Outcome: Not Progressing   Problem: Nutritional: Goal: Maintenance of adequate nutrition will improve Outcome: Not Progressing Goal: Progress toward achieving an optimal weight will improve Outcome: Not Progressing
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
## 2023-05-21 NOTE — Plan of Care (Signed)
  Problem: Health Behavior/Discharge Planning: Goal: Ability to manage health-related needs will improve Outcome: Progressing   Problem: Clinical Measurements: Goal: Will remain free from infection Outcome: Progressing Goal: Respiratory complications will improve Outcome: Progressing   Problem: Nutrition: Goal: Adequate nutrition will be maintained Outcome: Progressing   Problem: Pain Managment: Goal: General experience of comfort will improve and/or be controlled Outcome: Progressing   Problem: Safety: Goal: Ability to remain free from injury will improve Outcome: Progressing

## 2023-05-22 ENCOUNTER — Encounter: Payer: Self-pay | Admitting: Neurosurgery

## 2023-05-22 LAB — GLUCOSE, CAPILLARY
Glucose-Capillary: 107 mg/dL — ABNORMAL HIGH (ref 70–99)
Glucose-Capillary: 120 mg/dL — ABNORMAL HIGH (ref 70–99)
Glucose-Capillary: 137 mg/dL — ABNORMAL HIGH (ref 70–99)
Glucose-Capillary: 155 mg/dL — ABNORMAL HIGH (ref 70–99)
Glucose-Capillary: 164 mg/dL — ABNORMAL HIGH (ref 70–99)
Glucose-Capillary: 177 mg/dL — ABNORMAL HIGH (ref 70–99)

## 2023-05-22 NOTE — Progress Notes (Signed)
 Pt asleep in bed.  Bed in lowest position.  Bed alarm not activated d/t low weight.  Call light within reach. Tube feed infusing per order.  Pt dry/not soiled at this time.

## 2023-05-22 NOTE — Progress Notes (Signed)
 Pt asleep in bed.  Bed in lowest position.  Bed alarm not activated d/t low weight.  Call light within reach.
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
## 2023-05-23 LAB — GLUCOSE, CAPILLARY
Glucose-Capillary: 108 mg/dL — ABNORMAL HIGH (ref 70–99)
Glucose-Capillary: 123 mg/dL — ABNORMAL HIGH (ref 70–99)
Glucose-Capillary: 132 mg/dL — ABNORMAL HIGH (ref 70–99)
Glucose-Capillary: 171 mg/dL — ABNORMAL HIGH (ref 70–99)
Glucose-Capillary: 91 mg/dL (ref 70–99)
Glucose-Capillary: 96 mg/dL (ref 70–99)

## 2023-05-23 LAB — BASIC METABOLIC PANEL WITH GFR
Anion gap: 12 (ref 5–15)
BUN: 29 mg/dL — ABNORMAL HIGH (ref 8–23)
CO2: 28 mmol/L (ref 22–32)
Calcium: 9.4 mg/dL (ref 8.9–10.3)
Chloride: 97 mmol/L — ABNORMAL LOW (ref 98–111)
Creatinine, Ser: 0.57 mg/dL (ref 0.44–1.00)
GFR, Estimated: >60 mL/min (ref >60)
Glucose, Bld: 128 mg/dL — ABNORMAL HIGH (ref 70–99)
Potassium: 5 mmol/L (ref 3.5–5.1)
Sodium: 137 mmol/L (ref 135–145)

## 2023-05-23 LAB — CBC
HCT: 38.8 % (ref 36.0–46.0)
Hemoglobin: 12.3 g/dL (ref 12.0–15.0)
MCH: 29.1 pg (ref 26.0–34.0)
MCHC: 31.7 g/dL (ref 30.0–36.0)
MCV: 91.7 fL (ref 80.0–100.0)
Platelets: 488 10*3/uL — ABNORMAL HIGH (ref 150–400)
RBC: 4.23 MIL/uL (ref 3.87–5.11)
RDW: 13.7 % (ref 11.5–15.5)
WBC: 9.6 10*3/uL (ref 4.0–10.5)
nRBC: 0 % (ref 0.0–0.2)
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
## 2023-05-24 LAB — GLUCOSE, CAPILLARY
Glucose-Capillary: 104 mg/dL — ABNORMAL HIGH (ref 70–99)
Glucose-Capillary: 111 mg/dL — ABNORMAL HIGH (ref 70–99)
Glucose-Capillary: 119 mg/dL — ABNORMAL HIGH (ref 70–99)
Glucose-Capillary: 119 mg/dL — ABNORMAL HIGH (ref 70–99)
Glucose-Capillary: 56 mg/dL — ABNORMAL LOW (ref 70–99)
Glucose-Capillary: 58 mg/dL — ABNORMAL LOW (ref 70–99)
Glucose-Capillary: 87 mg/dL (ref 70–99)
Glucose-Capillary: 97 mg/dL (ref 70–99)

## 2023-05-24 MED ORDER — FREE WATER: 200.0000 mL | Freq: Four times a day (QID) | Status: DC

## 2023-05-24 MED ORDER — JEVITY 1.5 CAL/FIBER PO LIQD
237.0000 mL | Freq: Three times a day (TID) | ORAL | Status: DC
Start: 2023-05-24 — End: 2023-05-25
  Administered 2023-05-24: 237 mL
  Filled 2023-05-24 (×3): qty 237

## 2023-05-24 MED ORDER — DEXTROSE 50 % IV SOLN
INTRAVENOUS | Status: AC
Start: 2023-05-24 — End: 2023-05-25
  Filled 2023-05-24: qty 50

## 2023-05-24 MED ORDER — FREE WATER
150.0000 mL | Freq: Four times a day (QID) | Status: DC
  Administered 2023-05-24 – 2023-05-27 (×11): 150 mL

## 2023-05-24 MED ORDER — DEXTROSE 50 % IV SOLN
12.5000 g | INTRAVENOUS | Status: AC
  Administered 2023-05-25: 12.5 g via INTRAVENOUS
  Filled 2023-05-24: qty 50

## 2023-05-24 MED ORDER — DEXTROSE 50 % IV SOLN
12.5000 g | INTRAVENOUS | Status: AC
  Administered 2023-05-24: 12.5 g via INTRAVENOUS

## 2023-05-24 NOTE — TOC Progression Note (Addendum)
 Transition of Care [facility]) - Progression Note    Patient Details  Name: Rita Taylor MRN: [id number] Date of Birth: 10-29-1940  Transition of Care [facility]) CM/SW Contact  Shelbi Vaccaro A [place], LCSW Phone Number: 05/24/2023, 12:27 PM  Clinical Narrative:     Update 1525. CSW contacted both Dorene Sorrow and [name], pt's son and daughter to provide updated on pt's bed offers. CSW left voicemail's with both parties.   [facility] has not returned CSW calls, and has not given bed offer. CSW to update on [facility] facilities bed offers and discuss bed choice.      CSW was informed by [facility] that they cannot offer bed to pt.  CSW contacted [facility], waiting to hear back regarding bed offer.   CSW to follow up on decision for bed placement today with pt's family.   TOC will continue to follow.   Expected Discharge Plan: Skilled Nursing Facility    Expected Discharge Plan and Services                                               Social Determinants of Health (SDOH) Interventions SDOH Screenings   Food Insecurity: No Food Insecurity (04/27/2023)  Housing: Low Risk  (04/27/2023)  Transportation Needs: No Transportation Needs (04/27/2023)  Utilities: Not At Risk (04/27/2023)  Depression (PHQ2-9): Low Risk  (11/29/2022)  Financial Resource Strain: Low Risk  (06/03/2022)  Physical Activity: Unknown (06/03/2022)  Social Connections: Moderately Isolated (04/27/2023)  Stress: No Stress Concern Present (06/03/2022)  Tobacco Use: Low Risk  (04/27/2023)    Readmission Risk Interventions     No data to display

## 2023-05-24 NOTE — Plan of Care (Signed)

## 2023-05-24 NOTE — Progress Notes (Signed)
 Responded to consult for IV. On arrival, RN reports IV obtained. Consult cleared.
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
## 2023-05-25 ENCOUNTER — Encounter: Payer: Self-pay | Admitting: Neurosurgery

## 2023-05-25 LAB — CREATININE, SERUM
Creatinine, Ser: 0.79 mg/dL (ref 0.44–1.00)
GFR, Estimated: >60 mL/min (ref >60)

## 2023-05-25 LAB — GLUCOSE, CAPILLARY
Glucose-Capillary: 142 mg/dL — ABNORMAL HIGH (ref 70–99)
Glucose-Capillary: 34 mg/dL — CL (ref 70–99)
Glucose-Capillary: 52 mg/dL — ABNORMAL LOW (ref 70–99)
Glucose-Capillary: 151 mg/dL — ABNORMAL HIGH (ref 70–99)
Glucose-Capillary: 105 mg/dL — ABNORMAL HIGH (ref 70–99)
Glucose-Capillary: 91 mg/dL (ref 70–99)
Glucose-Capillary: 136 mg/dL — ABNORMAL HIGH (ref 70–99)
Glucose-Capillary: 149 mg/dL — ABNORMAL HIGH (ref 70–99)

## 2023-05-25 MED ORDER — MIRTAZAPINE 7.5 MG PO TABS: 7.5000 mg | ORAL_TABLET | Freq: Every day | ORAL | 0 refills | Status: DC

## 2023-05-25 MED ORDER — FREE WATER: 150.0000 mL | Freq: Four times a day (QID) | Status: DC

## 2023-05-25 MED ORDER — JEVITY 1.5 CAL/FIBER PO LIQD: 80.0000 mL/h | ORAL | Status: DC

## 2023-05-25 MED ORDER — OXYCODONE HCL 5 MG PO TABS: 2.5000 mg | ORAL_TABLET | ORAL | 0 refills | Status: DC | PRN

## 2023-05-25 MED ORDER — JEVITY 1.5 CAL/FIBER PO LIQD
80.0000 mL/h | ORAL | Status: DC
  Administered 2023-05-25 – 2023-06-08 (×16): 80 mL/h
  Filled 2023-05-25: qty 1000
  Filled 2023-05-25: qty 237
  Filled 2023-05-25: qty 1000
  Filled 2023-05-25 (×2): qty 237
  Filled 2023-05-25 (×5): qty 1000
  Filled 2023-05-25: qty 237
  Filled 2023-05-25 (×3): qty 1000
  Filled 2023-05-25: qty 237
  Filled 2023-05-25: qty 1000
  Filled 2023-05-25: qty 237
  Filled 2023-05-25 (×4): qty 1000
  Filled 2023-05-25: qty 237

## 2023-05-25 NOTE — Plan of Care (Signed)

## 2023-05-25 NOTE — TOC Progression Note (Signed)
 Transition of Care [facility]) - Progression Note    Patient Details  Name: BETSABE IGLESIA MRN: [id number] Date of Birth: 1940-03-16  Transition of Care [facility]) CM/SW Contact  Trent Theisen A [place], LCSW Phone Number: 05/25/2023, 11:55 AM  Clinical Narrative:     CSW contacted Doug, pt's son and was able to provide update on bed offers. Informed him that [place] facilities have not been able to give bed offers and provided updated list of local [place] facilities. He said that he would review and talk with sister to make decision. CSW requested by today as pt has been stable, he said he would try to do that and update CSW when he had an answer.   TOC will continue to follow.   Expected Discharge Plan: Skilled Nursing Facility    Expected Discharge Plan and Services                                               Social Determinants of Health (SDOH) Interventions SDOH Screenings   Food Insecurity: No Food Insecurity (04/27/2023)  Housing: Low Risk  (04/27/2023)  Transportation Needs: No Transportation Needs (04/27/2023)  Utilities: Not At Risk (04/27/2023)  Depression (PHQ2-9): Low Risk  (11/29/2022)  Financial Resource Strain: Low Risk  (06/03/2022)  Physical Activity: Unknown (06/03/2022)  Social Connections: Moderately Isolated (04/27/2023)  Stress: No Stress Concern Present (06/03/2022)  Tobacco Use: Low Risk  (04/27/2023)    Readmission Risk Interventions     No data to display
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
## 2023-05-25 NOTE — Care Management Important Message (Signed)
 Important Message  Patient Details  Name: Rita Taylor MRN: [id number] Date of Birth: 1940-03-20   Important Message Given:  Yes - Medicare IM  As per patient husband no additional IM needed as they still have the original one given    Dorena Bodo 05/25/2023, 8:34 AM

## 2023-05-25 NOTE — Progress Notes (Signed)
 Hypoglycemic Event  CBG: 56  Treatment: D50 25 mL (12.5 gm)  Symptoms: None  Follow-up CBG Result: 58  Treatment: Apple sauce, Apple juice (A sip)  Possible Reasons for Event: Inadequate meal intake  Follow-up CBG: 97  Care ongoing
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
## 2023-05-26 ENCOUNTER — Encounter: Payer: Medicare Other | Admitting: Neurosurgery

## 2023-05-26 LAB — GLUCOSE, CAPILLARY
Glucose-Capillary: 148 mg/dL — ABNORMAL HIGH (ref 70–99)
Glucose-Capillary: 112 mg/dL — ABNORMAL HIGH (ref 70–99)
Glucose-Capillary: 113 mg/dL — ABNORMAL HIGH (ref 70–99)
Glucose-Capillary: 153 mg/dL — ABNORMAL HIGH (ref 70–99)
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
## 2023-05-26 NOTE — Plan of Care (Signed)

## 2023-05-26 NOTE — Plan of Care (Signed)

## 2023-05-26 NOTE — TOC Progression Note (Addendum)
 Transition of Care [facility]) - Progression Note    Patient Details  Name: Rita Taylor MRN: [id number] Date of Birth: 03-Dec-1940  [facility] CM/SW Contact  Khole Arterburn A [place], LCSW Phone Number: 05/26/2023, 11:03 AM  Clinical Narrative:     Update 1552 CSW was contacted again by [name], discussed concerns about pt's medical course. CSW notified provider and should receive call regarding care. Informed him that [facility] reached out and provide possible bed offer if closed claim letter could be provided. Pt possible DC to [facility] or [place], if bed can be secured.    Update 1159:CSW spoke with pt's son [name], said that they are leaning towards [facility], informed CSW that he would reach back out later today with confirmation with his sister, pt's daughter.   Pt's son and daughter had not picked a facility when CSW received secure email from most recent bed offers.   CSW reached out and contacted pt's son, [name] to get follow up this am and had to leave VM. CSW to follow up again later today.    TOC will continue to follow.   Expected Discharge Plan: Skilled Nursing Facility    Expected Discharge Plan and Services                                               Social Determinants of Health (SDOH) Interventions SDOH Screenings   Food Insecurity: No Food Insecurity (04/27/2023)  Housing: Low Risk  (04/27/2023)  Transportation Needs: No Transportation Needs (04/27/2023)  Utilities: Not At Risk (04/27/2023)  Depression (PHQ2-9): Low Risk  (11/29/2022)  Financial Resource Strain: Low Risk  (06/03/2022)  Physical Activity: Unknown (06/03/2022)  Social Connections: Moderately Isolated (04/27/2023)  Stress: No Stress Concern Present (06/03/2022)  Tobacco Use: Low Risk  (04/27/2023)    Readmission Risk Interventions     No data to display

## 2023-05-27 LAB — BASIC METABOLIC PANEL WITH GFR
Anion gap: 14 (ref 5–15)
BUN: 31 mg/dL — ABNORMAL HIGH (ref 8–23)
CO2: 25 mmol/L (ref 22–32)
Calcium: 9.2 mg/dL (ref 8.9–10.3)
Chloride: 98 mmol/L (ref 98–111)
Creatinine, Ser: 0.58 mg/dL (ref 0.44–1.00)
GFR, Estimated: >60 mL/min (ref >60)
Glucose, Bld: 124 mg/dL — ABNORMAL HIGH (ref 70–99)
Potassium: 4.4 mmol/L (ref 3.5–5.1)
Sodium: 137 mmol/L (ref 135–145)

## 2023-05-27 LAB — GLUCOSE, CAPILLARY
Glucose-Capillary: 116 mg/dL — ABNORMAL HIGH (ref 70–99)
Glucose-Capillary: 124 mg/dL — ABNORMAL HIGH (ref 70–99)
Glucose-Capillary: 132 mg/dL — ABNORMAL HIGH (ref 70–99)
Glucose-Capillary: 143 mg/dL — ABNORMAL HIGH (ref 70–99)
Glucose-Capillary: 180 mg/dL — ABNORMAL HIGH (ref 70–99)

## 2023-05-27 LAB — CBC
HCT: 37.2 % (ref 36.0–46.0)
Hemoglobin: 12.3 g/dL (ref 12.0–15.0)
MCH: 30.4 pg (ref 26.0–34.0)
MCHC: 33.1 g/dL (ref 30.0–36.0)
MCV: 91.9 fL (ref 80.0–100.0)
Platelets: 467 10*3/uL — ABNORMAL HIGH (ref 150–400)
RBC: 4.05 MIL/uL (ref 3.87–5.11)
RDW: 13.8 % (ref 11.5–15.5)
WBC: 12.4 10*3/uL — ABNORMAL HIGH (ref 4.0–10.5)
nRBC: 0 % (ref 0.0–0.2)

## 2023-05-27 MED ORDER — METOPROLOL TARTRATE 12.5 MG HALF TABLET
12.5000 mg | ORAL_TABLET | Freq: Two times a day (BID) | ORAL | Status: DC
  Administered 2023-05-27 – 2023-05-29 (×4): 12.5 mg via ORAL
  Filled 2023-05-27 (×4): qty 1

## 2023-05-27 MED ORDER — FREE WATER
200.0000 mL | Freq: Four times a day (QID) | Status: DC
  Administered 2023-05-27 – 2023-06-09 (×52): 200 mL

## 2023-05-27 MED ORDER — SODIUM CHLORIDE 0.9 % IV SOLN: INTRAVENOUS | Status: DC
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
## 2023-05-27 NOTE — Plan of Care (Signed)
 Problem: Education: Goal: Knowledge of General Education information will improve Description: Including pain rating scale, medication(s)/side effects and non-pharmacologic comfort measures 05/27/2023 0526 by Noemi Chapel, RN Outcome: Progressing 05/26/2023 2003 by Noemi Chapel, RN Outcome: Progressing   Problem: Health Behavior/Discharge Planning: Goal: Ability to manage health-related needs will improve 05/27/2023 0526 by Noemi Chapel, RN Outcome: Progressing 05/26/2023 2003 by Noemi Chapel, RN Outcome: Progressing   Problem: Clinical Measurements: Goal: Ability to maintain clinical measurements within normal limits will improve 05/27/2023 0526 by Noemi Chapel, RN Outcome: Progressing 05/26/2023 2003 by Noemi Chapel, RN Outcome: Progressing Goal: Will remain free from infection 05/27/2023 0526 by Noemi Chapel, RN Outcome: Progressing 05/26/2023 2003 by Noemi Chapel, RN Outcome: Progressing Goal: Diagnostic test results will improve 05/27/2023 0526 by Noemi Chapel, RN Outcome: Progressing 05/26/2023 2003 by Noemi Chapel, RN Outcome: Progressing Goal: Respiratory complications will improve 05/27/2023 0526 by Noemi Chapel, RN Outcome: Progressing 05/26/2023 2003 by Noemi Chapel, RN Outcome: Progressing Goal: Cardiovascular complication will be avoided 05/27/2023 0526 by Noemi Chapel, RN Outcome: Progressing 05/26/2023 2003 by Noemi Chapel, RN Outcome: Progressing   Problem: Activity: Goal: Risk for activity intolerance will decrease 05/27/2023 0526 by Noemi Chapel, RN Outcome: Progressing 05/26/2023 2003 by Noemi Chapel, RN Outcome: Progressing   Problem: Nutrition: Goal: Adequate nutrition will be maintained 05/27/2023 0526 by Noemi Chapel, RN Outcome: Progressing 05/26/2023 2003 by Noemi Chapel, RN Outcome: Progressing   Problem: Coping: Goal: Level of anxiety will decrease 05/27/2023 0526 by Noemi Chapel, RN Outcome: Progressing 05/26/2023 2003 by Noemi Chapel, RN Outcome: Progressing   Problem: Elimination: Goal: Will not experience complications related to bowel motility 05/27/2023 0526 by Noemi Chapel, RN Outcome: Progressing 05/26/2023 2003 by Noemi Chapel, RN Outcome: Progressing Goal: Will not experience complications related to urinary retention 05/27/2023 0526 by Noemi Chapel, RN Outcome: Progressing 05/26/2023 2003 by Noemi Chapel, RN Outcome: Progressing   Problem: Pain Managment: Goal: General experience of comfort will improve and/or be controlled 05/27/2023 0526 by Noemi Chapel, RN Outcome: Progressing 05/26/2023 2003 by Noemi Chapel, RN Outcome: Progressing   Problem: Safety: Goal: Ability to remain free from injury will improve 05/27/2023 0526 by Noemi Chapel, RN Outcome: Progressing 05/26/2023 2003 by Noemi Chapel, RN Outcome: Progressing   Problem: Skin Integrity: Goal: Risk for impaired skin integrity will decrease 05/27/2023 0526 by Noemi Chapel, RN Outcome: Progressing 05/26/2023 2003 by Noemi Chapel, RN Outcome: Progressing   Problem: Education: Goal: Ability to describe self-care measures that may prevent or decrease complications (Diabetes Survival Skills Education) will improve 05/27/2023 0526 by Noemi Chapel, RN Outcome: Progressing 05/26/2023 2003 by Noemi Chapel, RN Outcome: Progressing Goal: Individualized Educational Video(s) 05/27/2023 0526 by Noemi Chapel, RN Outcome: Progressing 05/26/2023 2003 by Noemi Chapel, RN Outcome: Progressing   Problem: Coping: Goal: Ability to adjust to condition or change in health will improve 05/27/2023 0526 by Noemi Chapel, RN Outcome: Progressing 05/26/2023 2003 by Noemi Chapel, RN Outcome: Progressing   Problem: Fluid Volume: Goal: Ability to maintain a balanced intake and output will improve 05/27/2023 0526 by Noemi Chapel,  RN Outcome: Progressing 05/26/2023 2003 by Noemi Chapel, RN Outcome: Progressing   Problem: Health Behavior/Discharge Planning: Goal: Ability to identify and utilize available resources and services will improve 05/27/2023 0526 by Noemi Chapel, RN Outcome: Progressing 05/26/2023 2003 by  Noemi Chapel, RN Outcome: Progressing Goal: Ability to manage health-related needs will improve 05/27/2023 0526 by Noemi Chapel, RN Outcome: Progressing 05/26/2023 2003 by Noemi Chapel, RN Outcome: Progressing   Problem: Metabolic: Goal: Ability to maintain appropriate glucose levels will improve 05/27/2023 0526 by Noemi Chapel, RN Outcome: Progressing 05/26/2023 2003 by Noemi Chapel, RN Outcome: Progressing   Problem: Nutritional: Goal: Maintenance of adequate nutrition will improve 05/27/2023 0526 by Noemi Chapel, RN Outcome: Progressing 05/26/2023 2003 by Noemi Chapel, RN Outcome: Progressing Goal: Progress toward achieving an optimal weight will improve 05/27/2023 0526 by Noemi Chapel, RN Outcome: Progressing 05/26/2023 2003 by Noemi Chapel, RN Outcome: Progressing   Problem: Skin Integrity: Goal: Risk for impaired skin integrity will decrease 05/27/2023 0526 by Noemi Chapel, RN Outcome: Progressing 05/26/2023 2003 by Noemi Chapel, RN Outcome: Progressing   Problem: Tissue Perfusion: Goal: Adequacy of tissue perfusion will improve 05/27/2023 0526 by Noemi Chapel, RN Outcome: Progressing 05/26/2023 2003 by Noemi Chapel, RN Outcome: Progressing

## 2023-05-27 NOTE — TOC Progression Note (Signed)
 Transition of Care [facility]) - Progression Note    Patient Details  Name: Rita Taylor MRN: [id number] Date of Birth: September 30, 1940  Transition of [facility]) CM/SW Contact  Oluwadamilola Rosamond A [place], LCSW Phone Number: 05/27/2023, 4:22 PM  Clinical Narrative:     CSW was informed by [facility] that they cannot take pt due to closed claim letter not being sufficient and feel too risky. They followed up with pt's son Philipp Deputy"  to inform him as well.   [place] was second choice, CSW reached out regarding weekend availability, did not get answer, weekend CSW to follow up.   CSW then followed up with pt's son [name] to get update on bed decision. CSW left voicemail.   CSW informed by provider, holding off on DC due to medical stability, updated estimated date of discharge to be determined.    TOC will continue to follow.     Expected Discharge Plan: Skilled Nursing Facility    Expected Discharge Plan and Services                                               Social Determinants of Health (SDOH) Interventions SDOH Screenings   Food Insecurity: No Food Insecurity (04/27/2023)  Housing: Low Risk  (04/27/2023)  Transportation Needs: No Transportation Needs (04/27/2023)  Utilities: Not At Risk (04/27/2023)  Depression (PHQ2-9): Low Risk  (11/29/2022)  Financial Resource Strain: Low Risk  (06/03/2022)  Physical Activity: Unknown (06/03/2022)  Social Connections: Moderately Isolated (04/27/2023)  Stress: No Stress Concern Present (06/03/2022)  Tobacco Use: Low Risk  (04/27/2023)    Readmission Risk Interventions     No data to display
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
## 2023-05-27 NOTE — Progress Notes (Signed)
 For the last 3 days, have been trying to reach patient's son on the given phone number without success.  I left a voice message yesterday.  No response. I called and left a voice message again this morning.  I also tried to call patient's daughter in the given number without success.

## 2023-05-28 LAB — GLUCOSE, CAPILLARY
Glucose-Capillary: 137 mg/dL — ABNORMAL HIGH (ref 70–99)
Glucose-Capillary: 159 mg/dL — ABNORMAL HIGH (ref 70–99)
Glucose-Capillary: 96 mg/dL (ref 70–99)
Glucose-Capillary: 99 mg/dL (ref 70–99)

## 2023-05-28 MED ORDER — LOSARTAN POTASSIUM 50 MG PO TABS
100.0000 mg | ORAL_TABLET | Freq: Every day | ORAL | Status: DC
  Administered 2023-05-28 – 2023-06-09 (×13): 100 mg via ORAL
  Filled 2023-05-28 (×13): qty 2

## 2023-05-28 NOTE — Plan of Care (Signed)
  Problem: Nutrition: Goal: Adequate nutrition will be maintained Outcome: Progressing   Problem: Elimination: Goal: Will not experience complications related to urinary retention Outcome: Progressing   Problem: Skin Integrity: Goal: Risk for impaired skin integrity will decrease Outcome: Progressing   
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
## 2023-05-29 LAB — CBC
HCT: 38.1 % (ref 36.0–46.0)
Hemoglobin: 12 g/dL (ref 12.0–15.0)
MCH: 29.2 pg (ref 26.0–34.0)
MCHC: 31.5 g/dL (ref 30.0–36.0)
MCV: 92.7 fL (ref 80.0–100.0)
Platelets: 379 10*3/uL (ref 150–400)
RBC: 4.11 MIL/uL (ref 3.87–5.11)
RDW: 13.4 % (ref 11.5–15.5)
WBC: 13.7 10*3/uL — ABNORMAL HIGH (ref 4.0–10.5)
nRBC: 0 % (ref 0.0–0.2)

## 2023-05-29 LAB — GLUCOSE, CAPILLARY
Glucose-Capillary: 124 mg/dL — ABNORMAL HIGH (ref 70–99)
Glucose-Capillary: 132 mg/dL — ABNORMAL HIGH (ref 70–99)
Glucose-Capillary: 87 mg/dL (ref 70–99)

## 2023-05-29 LAB — BASIC METABOLIC PANEL WITH GFR
Anion gap: 15 (ref 5–15)
BUN: 14 mg/dL (ref 8–23)
CO2: 20 mmol/L — ABNORMAL LOW (ref 22–32)
Calcium: 9.2 mg/dL (ref 8.9–10.3)
Chloride: 100 mmol/L (ref 98–111)
Creatinine, Ser: 0.41 mg/dL — ABNORMAL LOW (ref 0.44–1.00)
GFR, Estimated: >60 mL/min (ref >60)
Glucose, Bld: 93 mg/dL (ref 70–99)
Potassium: 4.3 mmol/L (ref 3.5–5.1)
Sodium: 135 mmol/L (ref 135–145)

## 2023-05-29 MED ORDER — METOPROLOL TARTRATE 25 MG PO TABS
25.0000 mg | ORAL_TABLET | Freq: Two times a day (BID) | ORAL | Status: DC
  Administered 2023-05-29 – 2023-06-05 (×14): 25 mg via ORAL
  Filled 2023-05-29 (×15): qty 1

## 2023-05-29 NOTE — Plan of Care (Signed)
  Problem: Nutrition: Goal: Adequate nutrition will be maintained Outcome: Progressing   Problem: Elimination: Goal: Will not experience complications related to bowel motility Outcome: Progressing   Problem: Pain Managment: Goal: General experience of comfort will improve and/or be controlled Outcome: Progressing
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
## 2023-05-29 NOTE — Plan of Care (Signed)

## 2023-05-30 LAB — GLUCOSE, CAPILLARY
Glucose-Capillary: 114 mg/dL — ABNORMAL HIGH (ref 70–99)
Glucose-Capillary: 129 mg/dL — ABNORMAL HIGH (ref 70–99)
Glucose-Capillary: 136 mg/dL — ABNORMAL HIGH (ref 70–99)
Glucose-Capillary: 154 mg/dL — ABNORMAL HIGH (ref 70–99)
Glucose-Capillary: 97 mg/dL (ref 70–99)
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
## 2023-05-30 NOTE — Plan of Care (Signed)
  Problem: Clinical Measurements: Goal: Ability to maintain clinical measurements within normal limits will improve Outcome: Progressing Goal: Will remain free from infection Outcome: Progressing Goal: Diagnostic test results will improve Outcome: Progressing   Problem: Activity: Goal: Risk for activity intolerance will decrease Outcome: Progressing   Problem: Coping: Goal: Level of anxiety will decrease Outcome: Progressing   Problem: Elimination: Goal: Will not experience complications related to bowel motility Outcome: Progressing   Problem: Pain Managment: Goal: General experience of comfort will improve and/or be controlled Outcome: Progressing

## 2023-05-31 LAB — GLUCOSE, CAPILLARY
Glucose-Capillary: 149 mg/dL — ABNORMAL HIGH (ref 70–99)
Glucose-Capillary: 169 mg/dL — ABNORMAL HIGH (ref 70–99)
Glucose-Capillary: 118 mg/dL — ABNORMAL HIGH (ref 70–99)

## 2023-05-31 NOTE — Plan of Care (Signed)
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
## 2023-06-01 LAB — GLUCOSE, CAPILLARY
Glucose-Capillary: 119 mg/dL — ABNORMAL HIGH (ref 70–99)
Glucose-Capillary: 121 mg/dL — ABNORMAL HIGH (ref 70–99)
Glucose-Capillary: 129 mg/dL — ABNORMAL HIGH (ref 70–99)
Glucose-Capillary: 143 mg/dL — ABNORMAL HIGH (ref 70–99)
Glucose-Capillary: 146 mg/dL — ABNORMAL HIGH (ref 70–99)

## 2023-06-01 LAB — COMPREHENSIVE METABOLIC PANEL WITH GFR
ALT: 21 U/L (ref 0–44)
AST: 23 U/L (ref 15–41)
Albumin: 3.1 g/dL — ABNORMAL LOW (ref 3.5–5.0)
Alkaline Phosphatase: 85 U/L (ref 38–126)
Anion gap: 13 (ref 5–15)
BUN: 28 mg/dL — ABNORMAL HIGH (ref 8–23)
CO2: 27 mmol/L (ref 22–32)
Calcium: 9.3 mg/dL (ref 8.9–10.3)
Chloride: 95 mmol/L — ABNORMAL LOW (ref 98–111)
Creatinine, Ser: 0.54 mg/dL (ref 0.44–1.00)
GFR, Estimated: >60 mL/min (ref >60)
Glucose, Bld: 122 mg/dL — ABNORMAL HIGH (ref 70–99)
Potassium: 4.1 mmol/L (ref 3.5–5.1)
Sodium: 135 mmol/L (ref 135–145)
Total Bilirubin: 0.3 mg/dL (ref 0.0–1.2)
Total Protein: 5.9 g/dL — ABNORMAL LOW (ref 6.5–8.1)

## 2023-06-01 LAB — CBC
HCT: 34.2 % — ABNORMAL LOW (ref 36.0–46.0)
Hemoglobin: 10.9 g/dL — ABNORMAL LOW (ref 12.0–15.0)
MCH: 29.3 pg (ref 26.0–34.0)
MCHC: 31.9 g/dL (ref 30.0–36.0)
MCV: 91.9 fL (ref 80.0–100.0)
Platelets: 405 10*3/uL — ABNORMAL HIGH (ref 150–400)
RBC: 3.72 MIL/uL — ABNORMAL LOW (ref 3.87–5.11)
RDW: 13.8 % (ref 11.5–15.5)
WBC: 11.9 10*3/uL — ABNORMAL HIGH (ref 4.0–10.5)
nRBC: 0 % (ref 0.0–0.2)

## 2023-06-01 NOTE — Plan of Care (Signed)
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
## 2023-06-02 LAB — GLUCOSE, CAPILLARY
Glucose-Capillary: 128 mg/dL — ABNORMAL HIGH (ref 70–99)
Glucose-Capillary: 128 mg/dL — ABNORMAL HIGH (ref 70–99)
Glucose-Capillary: 149 mg/dL — ABNORMAL HIGH (ref 70–99)

## 2023-06-02 NOTE — TOC Progression Note (Addendum)
 Transition of Care [facility]) - Progression Note    Patient Details  Name: MARGERITE IMPASTATO MRN: [id number] Date of Birth: 1940-08-30  [facility] CM/SW Contact  Srihan Brutus A [place], LCSW Phone Number: 06/02/2023, 10:51 AM  Clinical Narrative:     Update 1248:  [facility] Declined  CSW contacted pt's son Georgette Kins and pt's daughter, Edwina Gram and gave updated estimates for cost of private pay to facilities and to reach out to CSW and discuss options.    TOC will continue to follow.    CSW contacted the following regarding private pay for admission:   [facility], 320/day for 30 days, semi private  [facility], unknown, reviewing, will update CSW  [facility], left voicemail with admissions, waiting to hear back.   [facility]- no answer, left VM with admission, waiting to hear back.  [facility]- informed them pt's family declined bed offer.   CSW to provide update to family on cost and bed decision of facilities once known.    TOC will continue to follow.     Expected Discharge Plan: Skilled Nursing Facility    Expected Discharge Plan and Services                                               Social Determinants of Health (SDOH) Interventions SDOH Screenings   Food Insecurity: No Food Insecurity (04/27/2023)  Housing: Low Risk  (04/27/2023)  Transportation Needs: No Transportation Needs (04/27/2023)  Utilities: Not At Risk (04/27/2023)  Depression (PHQ2-9): Low Risk  (11/29/2022)  Financial Resource Strain: Low Risk  (06/03/2022)  Physical Activity: Unknown (06/03/2022)  Social Connections: Moderately Isolated (04/27/2023)  Stress: No Stress Concern Present (06/03/2022)  Tobacco Use: Low Risk  (04/27/2023)    Readmission Risk Interventions     No data to display
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
## 2023-06-02 NOTE — Plan of Care (Signed)
 Patient alert to self. Peg tube in place, dressing C/D/I. Patient in mitts this morning, when rounded on early in shift, mitts, c-collar, tele and gown off. C-collar placed back, bedding changed. Mitts left off, patient has not removed anything for rest of shift. Nocturnal TF started. Safety and enteric precautions maintained.    Problem: Education: Goal: Knowledge of General Education information will improve Description: Including pain rating scale, medication(s)/side effects and non-pharmacologic comfort measures Outcome: Progressing   Problem: Clinical Measurements: Goal: Will remain free from infection Outcome: Progressing   Problem: Activity: Goal: Risk for activity intolerance will decrease Outcome: Progressing   Problem: Nutrition: Goal: Adequate nutrition will be maintained Outcome: Progressing
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
## 2023-06-02 NOTE — Plan of Care (Signed)

## 2023-06-03 LAB — GLUCOSE, CAPILLARY
Glucose-Capillary: 112 mg/dL — ABNORMAL HIGH (ref 70–99)
Glucose-Capillary: 116 mg/dL — ABNORMAL HIGH (ref 70–99)
Glucose-Capillary: 120 mg/dL — ABNORMAL HIGH (ref 70–99)
Glucose-Capillary: 97 mg/dL (ref 70–99)

## 2023-06-03 NOTE — Plan of Care (Signed)
  Problem: Education: Goal: Knowledge of General Education information will improve Description: Including pain rating scale, medication(s)/side effects and non-pharmacologic comfort measures 06/03/2023 1833 by Vonnie Gubler, RN Outcome: Progressing 06/03/2023 1654 by Vonnie Gubler, RN Outcome: Progressing   Problem: Health Behavior/Discharge Planning: Goal: Ability to manage health-related needs will improve 06/03/2023 1833 by Vonnie Gubler, RN Outcome: Progressing 06/03/2023 1654 by Vonnie Gubler, RN Outcome: Progressing   Problem: Clinical Measurements: Goal: Ability to maintain clinical measurements within normal limits will improve 06/03/2023 1833 by Vonnie Gubler, RN Outcome: Progressing 06/03/2023 1654 by Vonnie Gubler, RN Outcome: Progressing Goal: Will remain free from infection 06/03/2023 1833 by Vonnie Gubler, RN Outcome: Progressing 06/03/2023 1654 by Vonnie Gubler, RN Outcome: Progressing Goal: Diagnostic test results will improve 06/03/2023 1833 by Vonnie Gubler, RN Outcome: Progressing 06/03/2023 1654 by Vonnie Gubler, RN Outcome: Progressing Goal: Respiratory complications will improve 06/03/2023 1833 by Vonnie Gubler, RN Outcome: Progressing 06/03/2023 1654 by Vonnie Gubler, RN Outcome: Progressing Goal: Cardiovascular complication will be avoided 06/03/2023 1833 by Vonnie Gubler, RN Outcome: Progressing 06/03/2023 1654 by Vonnie Gubler, RN Outcome: Progressing   Problem: Activity: Goal: Risk for activity intolerance will decrease 06/03/2023 1833 by Vonnie Gubler, RN Outcome: Progressing 06/03/2023 1654 by Vonnie Gubler, RN Outcome: Progressing   Problem: Nutrition: Goal: Adequate nutrition will be maintained 06/03/2023 1833 by Vonnie Gubler, RN Outcome: Progressing 06/03/2023 1654 by Vonnie Gubler, RN Outcome: Progressing   Problem: Coping: Goal: Level of anxiety will decrease 06/03/2023 1833 by Vonnie Gubler, RN Outcome: Progressing 06/03/2023 1654 by Vonnie Gubler, RN Outcome: Progressing   Problem: Elimination: Goal: Will not experience complications related to bowel motility 06/03/2023 1833 by Vonnie Gubler, RN Outcome: Progressing 06/03/2023 1654 by Vonnie Gubler, RN Outcome: Progressing Goal: Will not experience complications related to urinary retention 06/03/2023 1833 by Vonnie Gubler, RN Outcome: Progressing 06/03/2023 1654 by Vonnie Gubler, RN Outcome: Progressing   Problem: Pain Managment: Goal: General experience of comfort will improve and/or be controlled 06/03/2023 1833 by Vonnie Gubler, RN Outcome: Progressing 06/03/2023 1654 by Vonnie Gubler, RN Outcome: Progressing   Problem: Safety: Goal: Ability to remain free from injury will improve Outcome: Progressing   Problem: Skin Integrity: Goal: Risk for impaired skin integrity will decrease Outcome: Progressing   Problem: Education: Goal: Ability to describe self-care measures that may prevent or decrease complications (Diabetes Survival Skills Education) will improve Outcome: Progressing Goal: Individualized Educational Video(s) Outcome: Progressing   Problem: Coping: Goal: Ability to adjust to condition or change in health will improve Outcome: Progressing   Problem: Fluid Volume: Goal: Ability to maintain a balanced intake and output will improve Outcome: Progressing   Problem: Health Behavior/Discharge Planning: Goal: Ability to identify and utilize available resources and services will improve Outcome: Progressing Goal: Ability to manage health-related needs will improve Outcome: Progressing   Problem: Metabolic: Goal: Ability to maintain appropriate glucose levels will improve Outcome: Progressing   Problem: Nutritional: Goal: Maintenance of adequate nutrition will improve Outcome: Progressing Goal: Progress toward achieving an optimal weight will improve Outcome: Progressing   Problem: Skin Integrity: Goal: Risk for impaired skin integrity will  decrease Outcome: Progressing   Problem: Tissue Perfusion: Goal: Adequacy of tissue perfusion will improve Outcome: Progressing

## 2023-06-03 NOTE — Plan of Care (Signed)
  Problem: Education: Goal: Knowledge of General Education information will improve Description: Including pain rating scale, medication(s)/side effects and non-pharmacologic comfort measures Outcome: Progressing   Problem: Health Behavior/Discharge Planning: Goal: Ability to manage health-related needs will improve Outcome: Progressing   Problem: Clinical Measurements: Goal: Ability to maintain clinical measurements within normal limits will improve Outcome: Progressing Goal: Will remain free from infection Outcome: Progressing Goal: Diagnostic test results will improve Outcome: Progressing Goal: Respiratory complications will improve Outcome: Progressing Goal: Cardiovascular complication will be avoided Outcome: Progressing   Problem: Nutrition: Goal: Adequate nutrition will be maintained Outcome: Progressing   Problem: Coping: Goal: Level of anxiety will decrease Outcome: Progressing   Problem: Pain Managment: Goal: General experience of comfort will improve and/or be controlled Outcome: Progressing   Problem: Skin Integrity: Goal: Risk for impaired skin integrity will decrease Outcome: Progressing   Problem: Education: Goal: Ability to describe self-care measures that may prevent or decrease complications (Diabetes Survival Skills Education) will improve Outcome: Progressing Goal: Individualized Educational Video(s) Outcome: Progressing   Problem: Coping: Goal: Ability to adjust to condition or change in health will improve Outcome: Progressing   Problem: Fluid Volume: Goal: Ability to maintain a balanced intake and output will improve Outcome: Progressing   Problem: Health Behavior/Discharge Planning: Goal: Ability to identify and utilize available resources and services will improve Outcome: Progressing Goal: Ability to manage health-related needs will improve Outcome: Progressing   Problem: Metabolic: Goal: Ability to maintain appropriate glucose  levels will improve Outcome: Progressing   Problem: Nutritional: Goal: Maintenance of adequate nutrition will improve Outcome: Progressing Goal: Progress toward achieving an optimal weight will improve Outcome: Progressing   Problem: Skin Integrity: Goal: Risk for impaired skin integrity will decrease Outcome: Progressing   Problem: Tissue Perfusion: Goal: Adequacy of tissue perfusion will improve Outcome: Progressing
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
## 2023-06-04 LAB — GLUCOSE, CAPILLARY
Glucose-Capillary: 118 mg/dL — ABNORMAL HIGH (ref 70–99)
Glucose-Capillary: 122 mg/dL — ABNORMAL HIGH (ref 70–99)
Glucose-Capillary: 125 mg/dL — ABNORMAL HIGH (ref 70–99)
Glucose-Capillary: 129 mg/dL — ABNORMAL HIGH (ref 70–99)
Glucose-Capillary: 139 mg/dL — ABNORMAL HIGH (ref 70–99)
Glucose-Capillary: 165 mg/dL — ABNORMAL HIGH (ref 70–99)

## 2023-06-04 LAB — BASIC METABOLIC PANEL WITH GFR
Anion gap: 13 (ref 5–15)
BUN: 29 mg/dL — ABNORMAL HIGH (ref 8–23)
CO2: 26 mmol/L (ref 22–32)
Calcium: 9.3 mg/dL (ref 8.9–10.3)
Chloride: 95 mmol/L — ABNORMAL LOW (ref 98–111)
Creatinine, Ser: 0.63 mg/dL (ref 0.44–1.00)
GFR, Estimated: >60 mL/min (ref >60)
Glucose, Bld: 108 mg/dL — ABNORMAL HIGH (ref 70–99)
Potassium: 4.2 mmol/L (ref 3.5–5.1)
Sodium: 134 mmol/L — ABNORMAL LOW (ref 135–145)

## 2023-06-04 LAB — CBC
HCT: 35.1 % — ABNORMAL LOW (ref 36.0–46.0)
Hemoglobin: 11.3 g/dL — ABNORMAL LOW (ref 12.0–15.0)
MCH: 29.4 pg (ref 26.0–34.0)
MCHC: 32.2 g/dL (ref 30.0–36.0)
MCV: 91.4 fL (ref 80.0–100.0)
Platelets: 454 10*3/uL — ABNORMAL HIGH (ref 150–400)
RBC: 3.84 MIL/uL — ABNORMAL LOW (ref 3.87–5.11)
RDW: 13.9 % (ref 11.5–15.5)
WBC: 8.5 10*3/uL (ref 4.0–10.5)
nRBC: 0 % (ref 0.0–0.2)
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
## 2023-06-05 ENCOUNTER — Encounter: Payer: Self-pay | Admitting: Oncology

## 2023-06-05 LAB — GLUCOSE, CAPILLARY
Glucose-Capillary: 146 mg/dL — ABNORMAL HIGH (ref 70–99)
Glucose-Capillary: 156 mg/dL — ABNORMAL HIGH (ref 70–99)

## 2023-06-05 MED ORDER — CARVEDILOL 3.125 MG PO TABS
3.1250 mg | ORAL_TABLET | Freq: Two times a day (BID) | ORAL | Status: DC
  Administered 2023-06-05 – 2023-06-06 (×3): 3.125 mg via ORAL
  Filled 2023-06-05 (×3): qty 1

## 2023-06-05 NOTE — Plan of Care (Signed)
 Patient without distress. Continuously removes clothing, bedding, Telemetry wires and other attachments throughout shift.Hourly rounding and monitoring will continue.    Problem: Education: Goal: Knowledge of General Education information will improve Description: Including pain rating scale, medication(s)/side effects and non-pharmacologic comfort measures Outcome: Progressing   Problem: Clinical Measurements: Goal: Ability to maintain clinical measurements within normal limits will improve Outcome: Progressing   Problem: Activity: Goal: Risk for activity intolerance will decrease Outcome: Progressing   Problem: Nutrition: Goal: Adequate nutrition will be maintained Outcome: Progressing   Problem: Elimination: Goal: Will not experience complications related to bowel motility Outcome: Progressing   Problem: Safety: Goal: Ability to remain free from injury will improve Outcome: Progressing

## 2023-06-05 NOTE — Progress Notes (Signed)
 Patient alert and responsive. Patient just received bath and linen change prior to vital signs being taken. Will continue to follow Yellow MEWS protocol/  06/05/23 0630  Assess: MEWS Score  Temp 98 F (36.7 C)  BP (!) 141/65  MAP (mmHg) 87  Pulse Rate (!) 127  Resp 18  Level of Consciousness Alert  SpO2 100 %  O2 Device Room Air  Assess: if the MEWS score is Yellow or Red  Were vital signs accurate and taken at a resting state? Yes  Does the patient meet 2 or more of the SIRS criteria? No  MEWS guidelines implemented  Yes, yellow  Treat  MEWS Interventions Considered administering scheduled or prn medications/treatments as ordered  Take Vital Signs  Increase Vital Sign Frequency  Yellow: Q2hr x1, continue Q4hrs until patient remains green for 12hrs  Escalate  MEWS: Escalate Yellow: Discuss with charge nurse and consider notifying provider and/or RRT  Notify: Charge Nurse/RN  Name of Charge Nurse/RN Notified Natalia, RN  Assess: SIRS CRITERIA  SIRS Temperature  0  SIRS Respirations  0  SIRS Pulse 1  SIRS WBC 0  SIRS Score Sum  1
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
## 2023-06-06 LAB — GLUCOSE, CAPILLARY
Glucose-Capillary: 112 mg/dL — ABNORMAL HIGH (ref 70–99)
Glucose-Capillary: 113 mg/dL — ABNORMAL HIGH (ref 70–99)
Glucose-Capillary: 200 mg/dL — ABNORMAL HIGH (ref 70–99)
Glucose-Capillary: 89 mg/dL (ref 70–99)

## 2023-06-06 NOTE — Plan of Care (Signed)
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
## 2023-06-07 ENCOUNTER — Inpatient Hospital Stay: Payer: Medicare Other | Admitting: Oncology

## 2023-06-07 ENCOUNTER — Inpatient Hospital Stay: Payer: Medicare Other

## 2023-06-07 LAB — GLUCOSE, CAPILLARY
Glucose-Capillary: 125 mg/dL — ABNORMAL HIGH (ref 70–99)
Glucose-Capillary: 127 mg/dL — ABNORMAL HIGH (ref 70–99)
Glucose-Capillary: 143 mg/dL — ABNORMAL HIGH (ref 70–99)
Glucose-Capillary: 150 mg/dL — ABNORMAL HIGH (ref 70–99)
Glucose-Capillary: 168 mg/dL — ABNORMAL HIGH (ref 70–99)
Glucose-Capillary: 194 mg/dL — ABNORMAL HIGH (ref 70–99)

## 2023-06-07 MED ORDER — CARVEDILOL 6.25 MG PO TABS
6.2500 mg | ORAL_TABLET | Freq: Two times a day (BID) | ORAL | Status: DC
  Administered 2023-06-07 (×2): 6.25 mg via ORAL
  Filled 2023-06-07 (×2): qty 1

## 2023-06-07 NOTE — Plan of Care (Signed)
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
## 2023-06-07 NOTE — TOC Progression Note (Signed)
 Transition of Care [facility]) - Progression Note    Patient Details  Name: Rita Taylor MRN: [id number] Date of Birth: 05-06-1940  Transition of Care [facility]) CM/SW Contact  Keileigh Vahey A [place], LCSW Phone Number: 06/07/2023, 4:45 PM  Clinical Narrative:     CSW contacted pt's son Georgette Kins to follow up regarding information for private pay. CSW left vm with contact information to reach out to CSW.    TOC will continue to follow.   Expected Discharge Plan: Skilled Nursing Facility    Expected Discharge Plan and Services                                               Social Determinants of Health (SDOH) Interventions SDOH Screenings   Food Insecurity: No Food Insecurity (04/27/2023)  Housing: Low Risk  (04/27/2023)  Transportation Needs: No Transportation Needs (04/27/2023)  Utilities: Not At Risk (04/27/2023)  Depression (PHQ2-9): Low Risk  (11/29/2022)  Financial Resource Strain: Low Risk  (06/03/2022)  Physical Activity: Unknown (06/03/2022)  Social Connections: Moderately Isolated (04/27/2023)  Stress: No Stress Concern Present (06/03/2022)  Tobacco Use: Low Risk  (04/27/2023)    Readmission Risk Interventions     No data to display

## 2023-06-08 LAB — BASIC METABOLIC PANEL WITH GFR
Anion gap: 12 (ref 5–15)
BUN: 27 mg/dL — ABNORMAL HIGH (ref 8–23)
CO2: 28 mmol/L (ref 22–32)
Calcium: 9.8 mg/dL (ref 8.9–10.3)
Chloride: 101 mmol/L (ref 98–111)
Creatinine, Ser: 0.48 mg/dL (ref 0.44–1.00)
GFR, Estimated: >60 mL/min (ref >60)
Glucose, Bld: 123 mg/dL — ABNORMAL HIGH (ref 70–99)
Potassium: 4.4 mmol/L (ref 3.5–5.1)
Sodium: 141 mmol/L (ref 135–145)

## 2023-06-08 LAB — CBC
HCT: 39.9 % (ref 36.0–46.0)
Hemoglobin: 12.5 g/dL (ref 12.0–15.0)
MCH: 28.9 pg (ref 26.0–34.0)
MCHC: 31.3 g/dL (ref 30.0–36.0)
MCV: 92.4 fL (ref 80.0–100.0)
Platelets: 448 10*3/uL — ABNORMAL HIGH (ref 150–400)
RBC: 4.32 MIL/uL (ref 3.87–5.11)
RDW: 14.2 % (ref 11.5–15.5)
WBC: 10.2 10*3/uL (ref 4.0–10.5)
nRBC: 0 % (ref 0.0–0.2)

## 2023-06-08 LAB — MAGNESIUM: Magnesium: 2.2 mg/dL (ref 1.7–2.4)

## 2023-06-08 LAB — PHOSPHORUS: Phosphorus: 4.4 mg/dL (ref 2.5–4.6)

## 2023-06-08 LAB — GLUCOSE, CAPILLARY
Glucose-Capillary: 174 mg/dL — ABNORMAL HIGH (ref 70–99)
Glucose-Capillary: 122 mg/dL — ABNORMAL HIGH (ref 70–99)
Glucose-Capillary: 181 mg/dL — ABNORMAL HIGH (ref 70–99)
Glucose-Capillary: 111 mg/dL — ABNORMAL HIGH (ref 70–99)
Glucose-Capillary: 142 mg/dL — ABNORMAL HIGH (ref 70–99)

## 2023-06-08 MED ORDER — CARVEDILOL 12.5 MG PO TABS
12.5000 mg | ORAL_TABLET | Freq: Two times a day (BID) | ORAL | Status: DC
  Administered 2023-06-08 – 2023-06-09 (×3): 12.5 mg via ORAL
  Filled 2023-06-08 (×3): qty 1

## 2023-06-08 MED ORDER — SODIUM CHLORIDE 0.9 % IV BOLUS
1000.0000 mL | Freq: Once | INTRAVENOUS | Status: AC
Start: 2023-06-08 — End: 2023-06-08
  Administered 2023-06-08: 1000 mL via INTRAVENOUS
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
## 2023-06-08 NOTE — Plan of Care (Signed)
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
## 2023-06-09 DIAGNOSIS — I2510 Atherosclerotic heart disease of native coronary artery without angina pectoris: Secondary | ICD-10-CM | POA: Diagnosis not present

## 2023-06-09 DIAGNOSIS — R4182 Altered mental status, unspecified: Secondary | ICD-10-CM | POA: Diagnosis not present

## 2023-06-09 DIAGNOSIS — R410 Disorientation, unspecified: Secondary | ICD-10-CM | POA: Diagnosis not present

## 2023-06-09 DIAGNOSIS — F39 Unspecified mood [affective] disorder: Secondary | ICD-10-CM | POA: Diagnosis not present

## 2023-06-09 DIAGNOSIS — Z931 Gastrostomy status: Secondary | ICD-10-CM | POA: Diagnosis not present

## 2023-06-09 DIAGNOSIS — R350 Frequency of micturition: Secondary | ICD-10-CM | POA: Diagnosis not present

## 2023-06-09 DIAGNOSIS — R2681 Unsteadiness on feet: Secondary | ICD-10-CM | POA: Diagnosis not present

## 2023-06-09 DIAGNOSIS — I10 Essential (primary) hypertension: Secondary | ICD-10-CM | POA: Diagnosis not present

## 2023-06-09 DIAGNOSIS — R6510 Systemic inflammatory response syndrome (SIRS) of non-infectious origin without acute organ dysfunction: Secondary | ICD-10-CM | POA: Diagnosis not present

## 2023-06-09 DIAGNOSIS — M4312 Spondylolisthesis, cervical region: Secondary | ICD-10-CM | POA: Diagnosis not present

## 2023-06-09 DIAGNOSIS — S12500D Unspecified displaced fracture of sixth cervical vertebra, subsequent encounter for fracture with routine healing: Secondary | ICD-10-CM | POA: Diagnosis not present

## 2023-06-09 DIAGNOSIS — R300 Dysuria: Secondary | ICD-10-CM | POA: Diagnosis not present

## 2023-06-09 DIAGNOSIS — G911 Obstructive hydrocephalus: Secondary | ICD-10-CM | POA: Diagnosis not present

## 2023-06-09 DIAGNOSIS — R41841 Cognitive communication deficit: Secondary | ICD-10-CM | POA: Diagnosis not present

## 2023-06-09 DIAGNOSIS — M6281 Muscle weakness (generalized): Secondary | ICD-10-CM | POA: Diagnosis not present

## 2023-06-09 DIAGNOSIS — M48062 Spinal stenosis, lumbar region with neurogenic claudication: Secondary | ICD-10-CM | POA: Diagnosis not present

## 2023-06-09 DIAGNOSIS — N390 Urinary tract infection, site not specified: Secondary | ICD-10-CM | POA: Diagnosis not present

## 2023-06-09 DIAGNOSIS — Z431 Encounter for attention to gastrostomy: Secondary | ICD-10-CM | POA: Diagnosis not present

## 2023-06-09 DIAGNOSIS — D333 Benign neoplasm of cranial nerves: Secondary | ICD-10-CM | POA: Diagnosis not present

## 2023-06-09 DIAGNOSIS — R1312 Dysphagia, oropharyngeal phase: Secondary | ICD-10-CM | POA: Diagnosis not present

## 2023-06-09 DIAGNOSIS — F329 Major depressive disorder, single episode, unspecified: Secondary | ICD-10-CM | POA: Diagnosis not present

## 2023-06-09 DIAGNOSIS — G912 (Idiopathic) normal pressure hydrocephalus: Secondary | ICD-10-CM | POA: Diagnosis not present

## 2023-06-09 DIAGNOSIS — T148XXA Other injury of unspecified body region, initial encounter: Secondary | ICD-10-CM | POA: Diagnosis not present

## 2023-06-09 DIAGNOSIS — R1310 Dysphagia, unspecified: Secondary | ICD-10-CM | POA: Diagnosis not present

## 2023-06-09 DIAGNOSIS — R3989 Other symptoms and signs involving the genitourinary system: Secondary | ICD-10-CM | POA: Diagnosis not present

## 2023-06-09 DIAGNOSIS — Z7401 Bed confinement status: Secondary | ICD-10-CM | POA: Diagnosis not present

## 2023-06-09 DIAGNOSIS — M810 Age-related osteoporosis without current pathological fracture: Secondary | ICD-10-CM | POA: Diagnosis not present

## 2023-06-09 DIAGNOSIS — G510 Bell's palsy: Secondary | ICD-10-CM | POA: Diagnosis not present

## 2023-06-09 DIAGNOSIS — M6259 Muscle wasting and atrophy, not elsewhere classified, multiple sites: Secondary | ICD-10-CM | POA: Diagnosis not present

## 2023-06-09 DIAGNOSIS — M4800 Spinal stenosis, site unspecified: Secondary | ICD-10-CM | POA: Diagnosis not present

## 2023-06-09 DIAGNOSIS — S061XAA Traumatic cerebral edema with loss of consciousness status unknown, initial encounter: Secondary | ICD-10-CM | POA: Diagnosis not present

## 2023-06-09 DIAGNOSIS — R2689 Other abnormalities of gait and mobility: Secondary | ICD-10-CM | POA: Diagnosis not present

## 2023-06-09 DIAGNOSIS — E43 Unspecified severe protein-calorie malnutrition: Secondary | ICD-10-CM | POA: Diagnosis not present

## 2023-06-09 DIAGNOSIS — R7303 Prediabetes: Secondary | ICD-10-CM | POA: Diagnosis not present

## 2023-06-09 DIAGNOSIS — Z982 Presence of cerebrospinal fluid drainage device: Secondary | ICD-10-CM | POA: Diagnosis not present

## 2023-06-09 DIAGNOSIS — G919 Hydrocephalus, unspecified: Secondary | ICD-10-CM | POA: Diagnosis not present

## 2023-06-09 DIAGNOSIS — Q8502 Neurofibromatosis, type 2: Secondary | ICD-10-CM | POA: Diagnosis not present

## 2023-06-09 DIAGNOSIS — M4802 Spinal stenosis, cervical region: Secondary | ICD-10-CM | POA: Diagnosis not present

## 2023-06-09 DIAGNOSIS — G936 Cerebral edema: Secondary | ICD-10-CM | POA: Diagnosis not present

## 2023-06-09 DIAGNOSIS — K9423 Gastrostomy malfunction: Secondary | ICD-10-CM | POA: Diagnosis not present

## 2023-06-09 DIAGNOSIS — R404 Transient alteration of awareness: Secondary | ICD-10-CM | POA: Diagnosis not present

## 2023-06-09 DIAGNOSIS — Q761 Klippel-Feil syndrome: Secondary | ICD-10-CM | POA: Diagnosis not present

## 2023-06-09 DIAGNOSIS — A0472 Enterocolitis due to Clostridium difficile, not specified as recurrent: Secondary | ICD-10-CM | POA: Diagnosis not present

## 2023-06-09 DIAGNOSIS — E119 Type 2 diabetes mellitus without complications: Secondary | ICD-10-CM | POA: Diagnosis not present

## 2023-06-09 DIAGNOSIS — S129XXD Fracture of neck, unspecified, subsequent encounter: Secondary | ICD-10-CM | POA: Diagnosis not present

## 2023-06-09 DIAGNOSIS — L089 Local infection of the skin and subcutaneous tissue, unspecified: Secondary | ICD-10-CM | POA: Diagnosis not present

## 2023-06-09 DIAGNOSIS — M21372 Foot drop, left foot: Secondary | ICD-10-CM | POA: Diagnosis not present

## 2023-06-09 DIAGNOSIS — E785 Hyperlipidemia, unspecified: Secondary | ICD-10-CM | POA: Diagnosis not present

## 2023-06-09 DIAGNOSIS — M542 Cervicalgia: Secondary | ICD-10-CM | POA: Diagnosis not present

## 2023-06-09 DIAGNOSIS — G935 Compression of brain: Secondary | ICD-10-CM | POA: Diagnosis not present

## 2023-06-09 DIAGNOSIS — Z79899 Other long term (current) drug therapy: Secondary | ICD-10-CM | POA: Diagnosis not present

## 2023-06-09 DIAGNOSIS — S129XXA Fracture of neck, unspecified, initial encounter: Secondary | ICD-10-CM | POA: Diagnosis not present

## 2023-06-09 DIAGNOSIS — F32A Depression, unspecified: Secondary | ICD-10-CM | POA: Diagnosis not present

## 2023-06-09 LAB — GLUCOSE, CAPILLARY
Glucose-Capillary: 128 mg/dL — ABNORMAL HIGH (ref 70–99)
Glucose-Capillary: 130 mg/dL — ABNORMAL HIGH (ref 70–99)
Glucose-Capillary: 93 mg/dL (ref 70–99)

## 2023-06-09 MED ORDER — FAMOTIDINE 20 MG PO TABS: 20.0000 mg | ORAL_TABLET | Freq: Every day | ORAL | Status: DC

## 2023-06-09 MED ORDER — VITAMIN B-12 1000 MCG PO TABS: 1000.0000 ug | ORAL_TABLET | Freq: Every day | ORAL | Status: DC

## 2023-06-09 MED ORDER — ENSURE ENLIVE PO LIQD: 237.0000 mL | Freq: Two times a day (BID) | ORAL | Status: DC

## 2023-06-09 MED ORDER — DOCUSATE SODIUM 50 MG/5ML PO LIQD: 100.0000 mg | Freq: Two times a day (BID) | ORAL | Status: DC

## 2023-06-09 MED ORDER — OYSTER SHELL CALCIUM/D3 500-5 MG-MCG PO TABS: 1.0000 | ORAL_TABLET | Freq: Three times a day (TID) | ORAL | Status: DC

## 2023-06-09 MED ORDER — PRAVASTATIN SODIUM 40 MG PO TABS: 40.0000 mg | ORAL_TABLET | Freq: Every day | ORAL | Status: DC

## 2023-06-09 MED ORDER — FREE WATER: 200.0000 mL | Freq: Four times a day (QID) | Status: DC

## 2023-06-09 MED ORDER — LOSARTAN POTASSIUM 100 MG PO TABS: 100.0000 mg | ORAL_TABLET | Freq: Every day | ORAL | Status: DC

## 2023-06-09 MED ORDER — MIRTAZAPINE 7.5 MG PO TABS: 7.5000 mg | ORAL_TABLET | Freq: Every day | ORAL | Status: DC

## 2023-06-09 MED ORDER — CARVEDILOL 25 MG PO TABS: 25.0000 mg | ORAL_TABLET | Freq: Two times a day (BID) | ORAL | Status: DC

## 2023-06-09 MED ORDER — BUPROPION HCL 75 MG PO TABS: 75.0000 mg | ORAL_TABLET | Freq: Every morning | ORAL | Status: DC

## 2023-06-09 MED ORDER — ASPIRIN 81 MG PO CHEW: 81.0000 mg | CHEWABLE_TABLET | Freq: Every day | ORAL | Status: DC

## 2023-06-09 NOTE — TOC Transition Note (Signed)
 Transition of Care [facility]) - Discharge Note   Patient Details  Name: Rita Taylor MRN: [id number] Date of Birth: [birth year]  Transition of Care [facility]) CM/SW Contact:  Andres Escandon A [place], LCSW Phone Number: 06/09/2023, 10:30 AM   Clinical Narrative:     Patient will DC to: [facility]  Anticipated DC date: 06/09/23  Family notified: Theodosia Fishman  Transport by: Lyna Sandhoff      Per MD patient ready for DC to [facility]. RN, patient, patient's family, and facility notified of DC. Discharge Summary and FL2 sent to facility. RN to call report prior to discharge (Room 43p, [phone]). DC packet on chart. Ambulance transport requested for patient.     CSW will sign off for now as social work intervention is no longer needed. Please consult us  again if new needs arise.    Final next level of care: Home w Home Health Services Barriers to Discharge: Barriers Resolved   Patient Goals and CMS Choice            Discharge Placement              Patient chooses bed at: [facility] Patient to be transferred to facility by: [facility] Name of family member notified: Theodosia Fishman Patient and family notified of of transfer: 06/09/23  Discharge Plan and Services Additional resources added to the After Visit Summary for                                       Social Drivers of Health (SDOH) Interventions SDOH Screenings   Food Insecurity: No Food Insecurity (04/27/2023)  Housing: Low Risk  (04/27/2023)  Transportation Needs: No Transportation Needs (04/27/2023)  Utilities: Not At Risk (04/27/2023)  Depression (PHQ2-9): Low Risk  (11/29/2022)  Financial Resource Strain: Low Risk  (06/03/2022)  Physical Activity: Unknown (06/03/2022)  Social Connections: Moderately Isolated (04/27/2023)  Stress: No Stress Concern Present (06/03/2022)  Tobacco Use: Low Risk  (04/27/2023)     Readmission Risk Interventions     No data to display

## 2023-06-09 NOTE — Progress Notes (Signed)
 [facility] ambulance service here to pick up pt. Pt aware of transfer, going to [facility]  [facility] report called to [facility] LPN. Vsss, pt cleaned prior to transfer, pericare and peg tube care done.

## 2023-06-09 NOTE — TOC Progression Note (Signed)
 Transition of Care [facility]) - Progression Note    Patient Details  Name: Rita Taylor MRN: [id number] Date of Birth: November 23, 1940  Transition of Care [facility]) CM/SW Contact  Arlyn Bumpus A [place], LCSW Phone Number: 06/09/2023, 9:28 AM  Clinical Narrative:     CSW was contacted by pt's daughter, Amelita Risinger, she asked CSW to proceed with choice to [facility] for rehab placement. CSW to follow up with facility about being able to take pt today. Provider notified. EDD today.   TOC will continue to follow.   Expected Discharge Plan: Skilled Nursing Facility    Expected Discharge Plan and Services                                               Social Determinants of Health (SDOH) Interventions SDOH Screenings   Food Insecurity: No Food Insecurity (04/27/2023)  Housing: Low Risk  (04/27/2023)  Transportation Needs: No Transportation Needs (04/27/2023)  Utilities: Not At Risk (04/27/2023)  Depression (PHQ2-9): Low Risk  (11/29/2022)  Financial Resource Strain: Low Risk  (06/03/2022)  Physical Activity: Unknown (06/03/2022)  Social Connections: Moderately Isolated (04/27/2023)  Stress: No Stress Concern Present (06/03/2022)  Tobacco Use: Low Risk  (04/27/2023)    Readmission Risk Interventions     No data to display
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
## 2023-06-09 NOTE — Plan of Care (Signed)

## 2023-06-10 DIAGNOSIS — E785 Hyperlipidemia, unspecified: Secondary | ICD-10-CM | POA: Diagnosis not present

## 2023-06-10 DIAGNOSIS — R1310 Dysphagia, unspecified: Secondary | ICD-10-CM | POA: Diagnosis not present

## 2023-06-10 DIAGNOSIS — E43 Unspecified severe protein-calorie malnutrition: Secondary | ICD-10-CM | POA: Diagnosis not present

## 2023-06-10 DIAGNOSIS — I10 Essential (primary) hypertension: Secondary | ICD-10-CM | POA: Diagnosis not present

## 2023-06-10 DIAGNOSIS — F329 Major depressive disorder, single episode, unspecified: Secondary | ICD-10-CM | POA: Diagnosis not present

## 2023-06-10 DIAGNOSIS — D333 Benign neoplasm of cranial nerves: Secondary | ICD-10-CM | POA: Diagnosis not present

## 2023-06-10 DIAGNOSIS — F32A Depression, unspecified: Secondary | ICD-10-CM | POA: Diagnosis not present

## 2023-06-10 DIAGNOSIS — E119 Type 2 diabetes mellitus without complications: Secondary | ICD-10-CM | POA: Diagnosis not present

## 2023-06-10 DIAGNOSIS — Z931 Gastrostomy status: Secondary | ICD-10-CM | POA: Diagnosis not present

## 2023-06-13 DIAGNOSIS — G911 Obstructive hydrocephalus: Secondary | ICD-10-CM | POA: Diagnosis not present

## 2023-06-14 DIAGNOSIS — R1310 Dysphagia, unspecified: Secondary | ICD-10-CM | POA: Diagnosis not present

## 2023-06-14 DIAGNOSIS — Z931 Gastrostomy status: Secondary | ICD-10-CM | POA: Diagnosis not present

## 2023-06-14 DIAGNOSIS — I10 Essential (primary) hypertension: Secondary | ICD-10-CM | POA: Diagnosis not present

## 2023-06-14 DIAGNOSIS — E43 Unspecified severe protein-calorie malnutrition: Secondary | ICD-10-CM | POA: Diagnosis not present

## 2023-06-14 DIAGNOSIS — D333 Benign neoplasm of cranial nerves: Secondary | ICD-10-CM | POA: Diagnosis not present

## 2023-06-15 DIAGNOSIS — Z982 Presence of cerebrospinal fluid drainage device: Secondary | ICD-10-CM | POA: Diagnosis not present

## 2023-06-15 DIAGNOSIS — I10 Essential (primary) hypertension: Secondary | ICD-10-CM | POA: Diagnosis not present

## 2023-06-15 DIAGNOSIS — G911 Obstructive hydrocephalus: Secondary | ICD-10-CM | POA: Diagnosis not present

## 2023-06-15 DIAGNOSIS — G912 (Idiopathic) normal pressure hydrocephalus: Secondary | ICD-10-CM | POA: Diagnosis not present

## 2023-06-15 DIAGNOSIS — E43 Unspecified severe protein-calorie malnutrition: Secondary | ICD-10-CM | POA: Diagnosis not present

## 2023-06-15 DIAGNOSIS — D333 Benign neoplasm of cranial nerves: Secondary | ICD-10-CM | POA: Diagnosis not present

## 2023-06-17 ENCOUNTER — Emergency Department

## 2023-06-17 ENCOUNTER — Emergency Department
Admission: EM | Admit: 2023-06-17 | Discharge: 2023-06-17 | Disposition: A | Discharge status: Nursing Home (Includes ICF) | Attending: Emergency Medicine | Admitting: Emergency Medicine

## 2023-06-17 ENCOUNTER — Other Ambulatory Visit: Payer: Self-pay

## 2023-06-17 ENCOUNTER — Encounter: Payer: Self-pay | Admitting: Emergency Medicine

## 2023-06-17 DIAGNOSIS — Z7401 Bed confinement status: Secondary | ICD-10-CM | POA: Diagnosis not present

## 2023-06-17 DIAGNOSIS — I10 Essential (primary) hypertension: Secondary | ICD-10-CM | POA: Diagnosis not present

## 2023-06-17 DIAGNOSIS — K9420 Gastrostomy complication, unspecified: Secondary | ICD-10-CM

## 2023-06-17 DIAGNOSIS — K9423 Gastrostomy malfunction: Secondary | ICD-10-CM | POA: Diagnosis not present

## 2023-06-17 DIAGNOSIS — Z982 Presence of cerebrospinal fluid drainage device: Secondary | ICD-10-CM | POA: Diagnosis not present

## 2023-06-17 DIAGNOSIS — Z931 Gastrostomy status: Secondary | ICD-10-CM | POA: Diagnosis not present

## 2023-06-17 DIAGNOSIS — M4800 Spinal stenosis, site unspecified: Secondary | ICD-10-CM | POA: Diagnosis not present

## 2023-06-17 NOTE — ED Notes (Signed)
 Attempted to call [facility] X2 for report without success.  No one answered the telephone.

## 2023-06-17 NOTE — ED Provider Notes (Signed)
 [facility] Provider Note    Event Date/Time   First MD Initiated Contact with Patient 06/17/23 [phone]     (approximate)   History   G Tube replacement   HPI  Rita Taylor is a 83 y.o. female with a history of hypertension, hyperlipidemia, and hydrocephalus status post VP shunt placement in February as well as gastrostomy tube placement a month ago, who presents with a dislodged gastrostomy tube this evening.  The patient states that it got pulled out accidentally while she was using the bathroom after getting caught on something.  I reviewed the past medical records.  The patient was discharged from the hospitalist service on 4/24 after being treated for dysphagia and malnutrition.   Physical Exam   Triage Vital Signs: ED Triage Vitals  Encounter Vitals Group     BP 06/17/23 0329 137/84     Systolic BP Percentile --      Diastolic BP Percentile --      Pulse Rate 06/17/23 0329 90     Resp 06/17/23 0329 20     Temp 06/17/23 0329 97.8 F (36.6 C)     Temp Source 06/17/23 0329 Oral     SpO2 06/17/23 0329 100 %     Weight --      Height --      Head Circumference --      Peak Flow --      Pain Score 06/17/23 0330 0     Pain Loc --      Pain Education --      Exclude from Growth Chart --     Most recent vital signs: Vitals:   06/17/23 0400 06/17/23 0430  BP: (!) 136/116 (!) 147/81  Pulse: 93 90  Resp:    Temp:    SpO2: 99% 100%     General: Awake, no distress.  CV:  Good peripheral perfusion.  Resp:  Normal effort.  Abd:  No distention.  Other:  Gastrostomy tube site intact.  Abdomen soft and nontender.   ED Results / Procedures / Treatments   Labs (all labs ordered are listed, but only abnormal results are displayed) Labs Reviewed - No data to display   EKG     RADIOLOGY  XR abdomen: I independently viewed and interpreted the images; there is contrast in the stomach consistent with appropriate G-tube  placement.   PROCEDURES:  Critical Care performed: No  .Gastrostomy tube replacement  Date/Time: 06/17/2023 4:51 AM  Performed by: Lind Repine, MD Authorized by: Lind Repine, MD  Consent: Verbal consent obtained. Consent given by: patient Patient identity confirmed: verbally with patient Local anesthesia used: no  Anesthesia: Local anesthesia used: no  Sedation: Patient sedated: no  Patient tolerance: patient tolerated the procedure well with no immediate complications      MEDICATIONS ORDERED IN ED: Medications - No data to display   IMPRESSION / MDM / ASSESSMENT AND PLAN / ED COURSE  I reviewed the triage vital signs and the nursing notes.  Differential diagnosis includes, but is not limited to, dislodged gastrostomy tube.  I successfully replaced the tube with an 18 French tube.  We will obtain x-ray to confirm placement.  Patient's presentation is most consistent with acute, uncomplicated illness.  ----------------------------------------- 5:34 AM on 06/17/2023 -----------------------------------------  Tube placement is confirmed by x-ray.  The patient is stable for discharge home.  Return precautions provided.   FINAL CLINICAL IMPRESSION(S) / ED DIAGNOSES   Final diagnoses:  Complication of  gastrostomy tube [facility])     Rx / DC Orders   ED Discharge Orders     None        Note:  This document was prepared using Dragon voice recognition software and may include unintentional dictation errors.    Lind Repine, MD 06/17/23 [phone]

## 2023-06-20 DIAGNOSIS — Z982 Presence of cerebrospinal fluid drainage device: Secondary | ICD-10-CM | POA: Diagnosis not present

## 2023-06-20 DIAGNOSIS — G911 Obstructive hydrocephalus: Secondary | ICD-10-CM | POA: Diagnosis not present

## 2023-06-20 DIAGNOSIS — G912 (Idiopathic) normal pressure hydrocephalus: Secondary | ICD-10-CM | POA: Diagnosis not present

## 2023-06-20 DIAGNOSIS — Z931 Gastrostomy status: Secondary | ICD-10-CM | POA: Diagnosis not present

## 2023-06-20 DIAGNOSIS — R1310 Dysphagia, unspecified: Secondary | ICD-10-CM | POA: Diagnosis not present

## 2023-06-20 DIAGNOSIS — R410 Disorientation, unspecified: Secondary | ICD-10-CM | POA: Diagnosis not present

## 2023-06-20 DIAGNOSIS — D333 Benign neoplasm of cranial nerves: Secondary | ICD-10-CM | POA: Diagnosis not present

## 2023-06-20 DIAGNOSIS — I10 Essential (primary) hypertension: Secondary | ICD-10-CM | POA: Diagnosis not present

## 2023-06-21 ENCOUNTER — Encounter: Payer: Medicare Other | Admitting: Neurosurgery

## 2023-06-21 DIAGNOSIS — Z982 Presence of cerebrospinal fluid drainage device: Secondary | ICD-10-CM | POA: Diagnosis not present

## 2023-06-21 DIAGNOSIS — D333 Benign neoplasm of cranial nerves: Secondary | ICD-10-CM | POA: Diagnosis not present

## 2023-06-21 DIAGNOSIS — E43 Unspecified severe protein-calorie malnutrition: Secondary | ICD-10-CM | POA: Diagnosis not present

## 2023-06-21 DIAGNOSIS — Z931 Gastrostomy status: Secondary | ICD-10-CM | POA: Diagnosis not present

## 2023-06-21 DIAGNOSIS — R1310 Dysphagia, unspecified: Secondary | ICD-10-CM | POA: Diagnosis not present

## 2023-06-22 DIAGNOSIS — D333 Benign neoplasm of cranial nerves: Secondary | ICD-10-CM | POA: Diagnosis not present

## 2023-06-22 DIAGNOSIS — Z931 Gastrostomy status: Secondary | ICD-10-CM | POA: Diagnosis not present

## 2023-06-22 DIAGNOSIS — E43 Unspecified severe protein-calorie malnutrition: Secondary | ICD-10-CM | POA: Diagnosis not present

## 2023-06-22 DIAGNOSIS — Z982 Presence of cerebrospinal fluid drainage device: Secondary | ICD-10-CM | POA: Diagnosis not present

## 2023-06-22 DIAGNOSIS — G912 (Idiopathic) normal pressure hydrocephalus: Secondary | ICD-10-CM | POA: Diagnosis not present

## 2023-06-22 DIAGNOSIS — R410 Disorientation, unspecified: Secondary | ICD-10-CM | POA: Diagnosis not present

## 2023-06-22 DIAGNOSIS — G911 Obstructive hydrocephalus: Secondary | ICD-10-CM | POA: Diagnosis not present

## 2023-06-22 DIAGNOSIS — R1310 Dysphagia, unspecified: Secondary | ICD-10-CM | POA: Diagnosis not present

## 2023-06-24 DIAGNOSIS — R1310 Dysphagia, unspecified: Secondary | ICD-10-CM | POA: Diagnosis not present

## 2023-06-24 DIAGNOSIS — E43 Unspecified severe protein-calorie malnutrition: Secondary | ICD-10-CM | POA: Diagnosis not present

## 2023-06-24 DIAGNOSIS — Z931 Gastrostomy status: Secondary | ICD-10-CM | POA: Diagnosis not present

## 2023-06-24 DIAGNOSIS — D333 Benign neoplasm of cranial nerves: Secondary | ICD-10-CM | POA: Diagnosis not present

## 2023-06-24 DIAGNOSIS — I10 Essential (primary) hypertension: Secondary | ICD-10-CM | POA: Diagnosis not present

## 2023-06-27 DIAGNOSIS — G911 Obstructive hydrocephalus: Secondary | ICD-10-CM | POA: Diagnosis not present

## 2023-06-27 DIAGNOSIS — R3989 Other symptoms and signs involving the genitourinary system: Secondary | ICD-10-CM | POA: Diagnosis not present

## 2023-06-27 DIAGNOSIS — R300 Dysuria: Secondary | ICD-10-CM | POA: Diagnosis not present

## 2023-06-27 DIAGNOSIS — N390 Urinary tract infection, site not specified: Secondary | ICD-10-CM | POA: Diagnosis not present

## 2023-06-27 DIAGNOSIS — R350 Frequency of micturition: Secondary | ICD-10-CM | POA: Diagnosis not present

## 2023-06-28 DIAGNOSIS — R3989 Other symptoms and signs involving the genitourinary system: Secondary | ICD-10-CM | POA: Diagnosis not present

## 2023-06-28 DIAGNOSIS — R300 Dysuria: Secondary | ICD-10-CM | POA: Diagnosis not present

## 2023-06-28 DIAGNOSIS — N390 Urinary tract infection, site not specified: Secondary | ICD-10-CM | POA: Diagnosis not present

## 2023-06-28 DIAGNOSIS — R350 Frequency of micturition: Secondary | ICD-10-CM | POA: Diagnosis not present

## 2023-06-29 DIAGNOSIS — N390 Urinary tract infection, site not specified: Secondary | ICD-10-CM | POA: Diagnosis not present

## 2023-06-29 DIAGNOSIS — R300 Dysuria: Secondary | ICD-10-CM | POA: Diagnosis not present

## 2023-06-29 DIAGNOSIS — R3989 Other symptoms and signs involving the genitourinary system: Secondary | ICD-10-CM | POA: Diagnosis not present

## 2023-06-29 DIAGNOSIS — R350 Frequency of micturition: Secondary | ICD-10-CM | POA: Diagnosis not present

## 2023-06-29 DIAGNOSIS — G911 Obstructive hydrocephalus: Secondary | ICD-10-CM | POA: Diagnosis not present

## 2023-06-30 ENCOUNTER — Other Ambulatory Visit: Payer: Self-pay | Admitting: Family Medicine

## 2023-06-30 ENCOUNTER — Ambulatory Visit
Admission: RE | Admit: 2023-06-30 | Discharge: 2023-06-30 | Disposition: A | Discharge status: Home / Self Care | Source: Ambulatory Visit | Attending: Neurosurgery | Admitting: Neurosurgery

## 2023-06-30 ENCOUNTER — Ambulatory Visit
Admission: RE | Admit: 2023-06-30 | Discharge: 2023-06-30 | Disposition: A | Discharge status: Home / Self Care | Attending: Neurosurgery | Admitting: Neurosurgery

## 2023-06-30 DIAGNOSIS — R350 Frequency of micturition: Secondary | ICD-10-CM | POA: Diagnosis not present

## 2023-06-30 DIAGNOSIS — N390 Urinary tract infection, site not specified: Secondary | ICD-10-CM | POA: Diagnosis not present

## 2023-06-30 DIAGNOSIS — Q761 Klippel-Feil syndrome: Secondary | ICD-10-CM | POA: Diagnosis not present

## 2023-06-30 DIAGNOSIS — R300 Dysuria: Secondary | ICD-10-CM | POA: Diagnosis not present

## 2023-06-30 DIAGNOSIS — R3989 Other symptoms and signs involving the genitourinary system: Secondary | ICD-10-CM | POA: Diagnosis not present

## 2023-06-30 DIAGNOSIS — S129XXA Fracture of neck, unspecified, initial encounter: Secondary | ICD-10-CM | POA: Diagnosis not present

## 2023-06-30 DIAGNOSIS — M4312 Spondylolisthesis, cervical region: Secondary | ICD-10-CM | POA: Diagnosis not present

## 2023-06-30 DIAGNOSIS — M4802 Spinal stenosis, cervical region: Secondary | ICD-10-CM | POA: Diagnosis not present

## 2023-06-30 DIAGNOSIS — M542 Cervicalgia: Secondary | ICD-10-CM | POA: Diagnosis not present

## 2023-07-01 DIAGNOSIS — R300 Dysuria: Secondary | ICD-10-CM | POA: Diagnosis not present

## 2023-07-01 DIAGNOSIS — N390 Urinary tract infection, site not specified: Secondary | ICD-10-CM | POA: Diagnosis not present

## 2023-07-01 DIAGNOSIS — R3989 Other symptoms and signs involving the genitourinary system: Secondary | ICD-10-CM | POA: Diagnosis not present

## 2023-07-04 DIAGNOSIS — G911 Obstructive hydrocephalus: Secondary | ICD-10-CM | POA: Diagnosis not present

## 2023-07-05 ENCOUNTER — Encounter: Payer: Medicare Other | Admitting: Neurosurgery

## 2023-07-06 DIAGNOSIS — G911 Obstructive hydrocephalus: Secondary | ICD-10-CM | POA: Diagnosis not present

## 2023-07-07 DIAGNOSIS — D333 Benign neoplasm of cranial nerves: Secondary | ICD-10-CM | POA: Diagnosis not present

## 2023-07-07 DIAGNOSIS — Z931 Gastrostomy status: Secondary | ICD-10-CM | POA: Diagnosis not present

## 2023-07-07 DIAGNOSIS — I10 Essential (primary) hypertension: Secondary | ICD-10-CM | POA: Diagnosis not present

## 2023-07-07 DIAGNOSIS — E43 Unspecified severe protein-calorie malnutrition: Secondary | ICD-10-CM | POA: Diagnosis not present

## 2023-07-07 DIAGNOSIS — R1310 Dysphagia, unspecified: Secondary | ICD-10-CM | POA: Diagnosis not present

## 2023-07-11 DIAGNOSIS — G911 Obstructive hydrocephalus: Secondary | ICD-10-CM | POA: Diagnosis not present

## 2023-07-13 DIAGNOSIS — E43 Unspecified severe protein-calorie malnutrition: Secondary | ICD-10-CM | POA: Diagnosis not present

## 2023-07-13 DIAGNOSIS — G911 Obstructive hydrocephalus: Secondary | ICD-10-CM | POA: Diagnosis not present

## 2023-07-13 DIAGNOSIS — Z982 Presence of cerebrospinal fluid drainage device: Secondary | ICD-10-CM | POA: Diagnosis not present

## 2023-07-13 DIAGNOSIS — I10 Essential (primary) hypertension: Secondary | ICD-10-CM | POA: Diagnosis not present

## 2023-07-13 DIAGNOSIS — Z931 Gastrostomy status: Secondary | ICD-10-CM | POA: Diagnosis not present

## 2023-07-13 DIAGNOSIS — D333 Benign neoplasm of cranial nerves: Secondary | ICD-10-CM | POA: Diagnosis not present

## 2023-07-13 DIAGNOSIS — R1310 Dysphagia, unspecified: Secondary | ICD-10-CM | POA: Diagnosis not present

## 2023-07-19 ENCOUNTER — Encounter: Payer: Self-pay | Admitting: Internal Medicine

## 2023-07-20 DIAGNOSIS — Z982 Presence of cerebrospinal fluid drainage device: Secondary | ICD-10-CM | POA: Diagnosis not present

## 2023-07-20 DIAGNOSIS — G911 Obstructive hydrocephalus: Secondary | ICD-10-CM | POA: Diagnosis not present

## 2023-07-20 DIAGNOSIS — Z931 Gastrostomy status: Secondary | ICD-10-CM | POA: Diagnosis not present

## 2023-07-20 DIAGNOSIS — E43 Unspecified severe protein-calorie malnutrition: Secondary | ICD-10-CM | POA: Diagnosis not present

## 2023-07-20 DIAGNOSIS — I10 Essential (primary) hypertension: Secondary | ICD-10-CM | POA: Diagnosis not present

## 2023-07-20 DIAGNOSIS — D333 Benign neoplasm of cranial nerves: Secondary | ICD-10-CM | POA: Diagnosis not present

## 2023-07-20 DIAGNOSIS — R1310 Dysphagia, unspecified: Secondary | ICD-10-CM | POA: Diagnosis not present

## 2023-07-21 DIAGNOSIS — R1310 Dysphagia, unspecified: Secondary | ICD-10-CM | POA: Diagnosis not present

## 2023-07-21 DIAGNOSIS — R410 Disorientation, unspecified: Secondary | ICD-10-CM | POA: Diagnosis not present

## 2023-07-21 DIAGNOSIS — Z982 Presence of cerebrospinal fluid drainage device: Secondary | ICD-10-CM | POA: Diagnosis not present

## 2023-07-21 DIAGNOSIS — E43 Unspecified severe protein-calorie malnutrition: Secondary | ICD-10-CM | POA: Diagnosis not present

## 2023-07-21 DIAGNOSIS — Z931 Gastrostomy status: Secondary | ICD-10-CM | POA: Diagnosis not present

## 2023-07-22 ENCOUNTER — Telehealth: Payer: Self-pay

## 2023-07-22 NOTE — Telephone Encounter (Signed)
 Copied from CRM [id number]. Topic: Appointments - Scheduling Inquiry for Clinic >> Jul 22, 2023  4:26 PM Luane Rumps D wrote: Reason for CRM: Patient needs a hospital follow-up but the first available isn't until July 22nd. If it's possible to schedule sooner for Ms. Fluellen she would appreciate it, reach out to her at her home number to schedule if it's possible.  I left a voicemail for patient letting her know that I'm calling to schedule her ED follow-up appointment.  I asked her to please call us  back.

## 2023-07-25 DIAGNOSIS — R410 Disorientation, unspecified: Secondary | ICD-10-CM | POA: Diagnosis not present

## 2023-07-25 DIAGNOSIS — Z931 Gastrostomy status: Secondary | ICD-10-CM | POA: Diagnosis not present

## 2023-07-25 DIAGNOSIS — G911 Obstructive hydrocephalus: Secondary | ICD-10-CM | POA: Diagnosis not present

## 2023-07-25 DIAGNOSIS — I10 Essential (primary) hypertension: Secondary | ICD-10-CM | POA: Diagnosis not present

## 2023-07-25 DIAGNOSIS — R1310 Dysphagia, unspecified: Secondary | ICD-10-CM | POA: Diagnosis not present

## 2023-07-25 DIAGNOSIS — E43 Unspecified severe protein-calorie malnutrition: Secondary | ICD-10-CM | POA: Diagnosis not present

## 2023-07-25 NOTE — Telephone Encounter (Signed)
 Spoke with pt and scheduled her for 08/05/2023

## 2023-07-27 DIAGNOSIS — G911 Obstructive hydrocephalus: Secondary | ICD-10-CM | POA: Diagnosis not present

## 2023-07-27 NOTE — Telephone Encounter (Signed)
 Pt cancelled HFU scheduled on 6/20. Pt did not reschedule. She is also overdue for Prolia . I have made several attempts to reach her in regards to Prolia . If pt rescheduled HFU, please add a note to discuss Prolia .

## 2023-07-29 DIAGNOSIS — E785 Hyperlipidemia, unspecified: Secondary | ICD-10-CM | POA: Diagnosis not present

## 2023-07-29 DIAGNOSIS — F329 Major depressive disorder, single episode, unspecified: Secondary | ICD-10-CM | POA: Diagnosis not present

## 2023-07-29 DIAGNOSIS — E119 Type 2 diabetes mellitus without complications: Secondary | ICD-10-CM | POA: Diagnosis not present

## 2023-07-29 DIAGNOSIS — F32A Depression, unspecified: Secondary | ICD-10-CM | POA: Diagnosis not present

## 2023-08-01 DIAGNOSIS — G911 Obstructive hydrocephalus: Secondary | ICD-10-CM | POA: Diagnosis not present

## 2023-08-02 DIAGNOSIS — I10 Essential (primary) hypertension: Secondary | ICD-10-CM | POA: Diagnosis not present

## 2023-08-02 DIAGNOSIS — Z931 Gastrostomy status: Secondary | ICD-10-CM | POA: Diagnosis not present

## 2023-08-02 DIAGNOSIS — R1310 Dysphagia, unspecified: Secondary | ICD-10-CM | POA: Diagnosis not present

## 2023-08-02 DIAGNOSIS — G912 (Idiopathic) normal pressure hydrocephalus: Secondary | ICD-10-CM | POA: Diagnosis not present

## 2023-08-03 DIAGNOSIS — R1310 Dysphagia, unspecified: Secondary | ICD-10-CM | POA: Diagnosis not present

## 2023-08-03 DIAGNOSIS — Z931 Gastrostomy status: Secondary | ICD-10-CM | POA: Diagnosis not present

## 2023-08-03 DIAGNOSIS — G911 Obstructive hydrocephalus: Secondary | ICD-10-CM | POA: Diagnosis not present

## 2023-08-03 DIAGNOSIS — G912 (Idiopathic) normal pressure hydrocephalus: Secondary | ICD-10-CM | POA: Diagnosis not present

## 2023-08-03 DIAGNOSIS — D333 Benign neoplasm of cranial nerves: Secondary | ICD-10-CM | POA: Diagnosis not present

## 2023-08-03 DIAGNOSIS — I10 Essential (primary) hypertension: Secondary | ICD-10-CM | POA: Diagnosis not present

## 2023-08-04 DIAGNOSIS — I10 Essential (primary) hypertension: Secondary | ICD-10-CM | POA: Diagnosis not present

## 2023-08-04 DIAGNOSIS — R1310 Dysphagia, unspecified: Secondary | ICD-10-CM | POA: Diagnosis not present

## 2023-08-04 DIAGNOSIS — D333 Benign neoplasm of cranial nerves: Secondary | ICD-10-CM | POA: Diagnosis not present

## 2023-08-04 DIAGNOSIS — Z931 Gastrostomy status: Secondary | ICD-10-CM | POA: Diagnosis not present

## 2023-08-04 DIAGNOSIS — G912 (Idiopathic) normal pressure hydrocephalus: Secondary | ICD-10-CM | POA: Diagnosis not present

## 2023-08-05 ENCOUNTER — Inpatient Hospital Stay: Admitting: Internal Medicine

## 2023-08-05 DIAGNOSIS — R1310 Dysphagia, unspecified: Secondary | ICD-10-CM | POA: Diagnosis not present

## 2023-08-05 DIAGNOSIS — Z79899 Other long term (current) drug therapy: Secondary | ICD-10-CM | POA: Diagnosis not present

## 2023-08-05 DIAGNOSIS — G912 (Idiopathic) normal pressure hydrocephalus: Secondary | ICD-10-CM | POA: Diagnosis not present

## 2023-08-05 DIAGNOSIS — Z931 Gastrostomy status: Secondary | ICD-10-CM | POA: Diagnosis not present

## 2023-08-05 NOTE — Telephone Encounter (Signed)
 Pt cancelled HFU scheduled for today & I have made several attempts to schedule her Prolia  Injection since she has been out of the hospital.

## 2023-08-05 NOTE — Telephone Encounter (Signed)
 fyi

## 2023-08-08 DIAGNOSIS — R1310 Dysphagia, unspecified: Secondary | ICD-10-CM | POA: Diagnosis not present

## 2023-08-08 DIAGNOSIS — Z931 Gastrostomy status: Secondary | ICD-10-CM | POA: Diagnosis not present

## 2023-08-08 DIAGNOSIS — E785 Hyperlipidemia, unspecified: Secondary | ICD-10-CM | POA: Diagnosis not present

## 2023-08-08 DIAGNOSIS — L089 Local infection of the skin and subcutaneous tissue, unspecified: Secondary | ICD-10-CM | POA: Diagnosis not present

## 2023-08-08 DIAGNOSIS — T148XXA Other injury of unspecified body region, initial encounter: Secondary | ICD-10-CM | POA: Diagnosis not present

## 2023-08-08 DIAGNOSIS — F32A Depression, unspecified: Secondary | ICD-10-CM | POA: Diagnosis not present

## 2023-08-08 DIAGNOSIS — F329 Major depressive disorder, single episode, unspecified: Secondary | ICD-10-CM | POA: Diagnosis not present

## 2023-08-08 DIAGNOSIS — E119 Type 2 diabetes mellitus without complications: Secondary | ICD-10-CM | POA: Diagnosis not present

## 2023-08-09 DIAGNOSIS — L089 Local infection of the skin and subcutaneous tissue, unspecified: Secondary | ICD-10-CM | POA: Diagnosis not present

## 2023-08-09 DIAGNOSIS — Z931 Gastrostomy status: Secondary | ICD-10-CM | POA: Diagnosis not present

## 2023-08-09 DIAGNOSIS — T148XXA Other injury of unspecified body region, initial encounter: Secondary | ICD-10-CM | POA: Diagnosis not present

## 2023-08-09 DIAGNOSIS — R1310 Dysphagia, unspecified: Secondary | ICD-10-CM | POA: Diagnosis not present

## 2023-08-10 DIAGNOSIS — L089 Local infection of the skin and subcutaneous tissue, unspecified: Secondary | ICD-10-CM | POA: Diagnosis not present

## 2023-08-10 DIAGNOSIS — R1310 Dysphagia, unspecified: Secondary | ICD-10-CM | POA: Diagnosis not present

## 2023-08-10 DIAGNOSIS — Z931 Gastrostomy status: Secondary | ICD-10-CM | POA: Diagnosis not present

## 2023-08-10 DIAGNOSIS — T148XXA Other injury of unspecified body region, initial encounter: Secondary | ICD-10-CM | POA: Diagnosis not present

## 2023-08-11 DIAGNOSIS — I2510 Atherosclerotic heart disease of native coronary artery without angina pectoris: Secondary | ICD-10-CM | POA: Diagnosis not present

## 2023-08-11 DIAGNOSIS — H109 Unspecified conjunctivitis: Secondary | ICD-10-CM | POA: Diagnosis not present

## 2023-08-11 DIAGNOSIS — E119 Type 2 diabetes mellitus without complications: Secondary | ICD-10-CM | POA: Diagnosis not present

## 2023-08-11 DIAGNOSIS — L0390 Cellulitis, unspecified: Secondary | ICD-10-CM | POA: Diagnosis not present

## 2023-08-13 DIAGNOSIS — L03311 Cellulitis of abdominal wall: Secondary | ICD-10-CM | POA: Diagnosis not present

## 2023-08-13 DIAGNOSIS — Z79891 Long term (current) use of opiate analgesic: Secondary | ICD-10-CM | POA: Diagnosis not present

## 2023-08-13 DIAGNOSIS — E785 Hyperlipidemia, unspecified: Secondary | ICD-10-CM | POA: Diagnosis not present

## 2023-08-13 DIAGNOSIS — M810 Age-related osteoporosis without current pathological fracture: Secondary | ICD-10-CM | POA: Diagnosis not present

## 2023-08-13 DIAGNOSIS — R634 Abnormal weight loss: Secondary | ICD-10-CM | POA: Diagnosis not present

## 2023-08-13 DIAGNOSIS — G510 Bell's palsy: Secondary | ICD-10-CM | POA: Diagnosis not present

## 2023-08-13 DIAGNOSIS — Q8500 Neurofibromatosis, unspecified: Secondary | ICD-10-CM | POA: Diagnosis not present

## 2023-08-13 DIAGNOSIS — G3184 Mild cognitive impairment, so stated: Secondary | ICD-10-CM | POA: Diagnosis not present

## 2023-08-13 DIAGNOSIS — I2510 Atherosclerotic heart disease of native coronary artery without angina pectoris: Secondary | ICD-10-CM | POA: Diagnosis not present

## 2023-08-13 DIAGNOSIS — D509 Iron deficiency anemia, unspecified: Secondary | ICD-10-CM | POA: Diagnosis not present

## 2023-08-13 DIAGNOSIS — H5440 Blindness, one eye, unspecified eye: Secondary | ICD-10-CM | POA: Diagnosis not present

## 2023-08-13 DIAGNOSIS — F32A Depression, unspecified: Secondary | ICD-10-CM | POA: Diagnosis not present

## 2023-08-13 DIAGNOSIS — I10 Essential (primary) hypertension: Secondary | ICD-10-CM | POA: Diagnosis not present

## 2023-08-13 DIAGNOSIS — S129XXD Fracture of neck, unspecified, subsequent encounter: Secondary | ICD-10-CM | POA: Diagnosis not present

## 2023-08-13 DIAGNOSIS — G911 Obstructive hydrocephalus: Secondary | ICD-10-CM | POA: Diagnosis not present

## 2023-08-13 DIAGNOSIS — H109 Unspecified conjunctivitis: Secondary | ICD-10-CM | POA: Diagnosis not present

## 2023-08-13 DIAGNOSIS — Z85820 Personal history of malignant melanoma of skin: Secondary | ICD-10-CM | POA: Diagnosis not present

## 2023-08-13 DIAGNOSIS — M48061 Spinal stenosis, lumbar region without neurogenic claudication: Secondary | ICD-10-CM | POA: Diagnosis not present

## 2023-08-13 DIAGNOSIS — K9422 Gastrostomy infection: Secondary | ICD-10-CM | POA: Diagnosis not present

## 2023-08-13 DIAGNOSIS — Z86011 Personal history of benign neoplasm of the brain: Secondary | ICD-10-CM | POA: Diagnosis not present

## 2023-08-13 DIAGNOSIS — E119 Type 2 diabetes mellitus without complications: Secondary | ICD-10-CM | POA: Diagnosis not present

## 2023-08-13 DIAGNOSIS — G8929 Other chronic pain: Secondary | ICD-10-CM | POA: Diagnosis not present

## 2023-08-15 DIAGNOSIS — K9422 Gastrostomy infection: Secondary | ICD-10-CM | POA: Diagnosis not present

## 2023-08-15 DIAGNOSIS — L03311 Cellulitis of abdominal wall: Secondary | ICD-10-CM | POA: Diagnosis not present

## 2023-08-15 DIAGNOSIS — D509 Iron deficiency anemia, unspecified: Secondary | ICD-10-CM | POA: Diagnosis not present

## 2023-08-15 DIAGNOSIS — E119 Type 2 diabetes mellitus without complications: Secondary | ICD-10-CM | POA: Diagnosis not present

## 2023-08-15 DIAGNOSIS — E782 Mixed hyperlipidemia: Secondary | ICD-10-CM | POA: Diagnosis not present

## 2023-08-15 DIAGNOSIS — R7309 Other abnormal glucose: Secondary | ICD-10-CM | POA: Diagnosis not present

## 2023-08-15 DIAGNOSIS — I10 Essential (primary) hypertension: Secondary | ICD-10-CM | POA: Diagnosis not present

## 2023-08-15 DIAGNOSIS — I2510 Atherosclerotic heart disease of native coronary artery without angina pectoris: Secondary | ICD-10-CM | POA: Diagnosis not present

## 2023-08-15 DIAGNOSIS — E038 Other specified hypothyroidism: Secondary | ICD-10-CM | POA: Diagnosis not present

## 2023-08-15 DIAGNOSIS — E559 Vitamin D deficiency, unspecified: Secondary | ICD-10-CM | POA: Diagnosis not present

## 2023-08-15 DIAGNOSIS — Z79899 Other long term (current) drug therapy: Secondary | ICD-10-CM | POA: Diagnosis not present

## 2023-08-15 DIAGNOSIS — D519 Vitamin B12 deficiency anemia, unspecified: Secondary | ICD-10-CM | POA: Diagnosis not present

## 2023-08-16 DIAGNOSIS — R5381 Other malaise: Secondary | ICD-10-CM | POA: Diagnosis not present

## 2023-08-16 DIAGNOSIS — I10 Essential (primary) hypertension: Secondary | ICD-10-CM | POA: Diagnosis not present

## 2023-08-16 DIAGNOSIS — M48061 Spinal stenosis, lumbar region without neurogenic claudication: Secondary | ICD-10-CM | POA: Diagnosis not present

## 2023-08-16 DIAGNOSIS — D509 Iron deficiency anemia, unspecified: Secondary | ICD-10-CM | POA: Diagnosis not present

## 2023-08-16 DIAGNOSIS — L03311 Cellulitis of abdominal wall: Secondary | ICD-10-CM | POA: Diagnosis not present

## 2023-08-16 DIAGNOSIS — K9422 Gastrostomy infection: Secondary | ICD-10-CM | POA: Diagnosis not present

## 2023-08-16 DIAGNOSIS — G8929 Other chronic pain: Secondary | ICD-10-CM | POA: Diagnosis not present

## 2023-08-16 DIAGNOSIS — I2510 Atherosclerotic heart disease of native coronary artery without angina pectoris: Secondary | ICD-10-CM | POA: Diagnosis not present

## 2023-08-16 DIAGNOSIS — E119 Type 2 diabetes mellitus without complications: Secondary | ICD-10-CM | POA: Diagnosis not present

## 2023-08-17 ENCOUNTER — Other Ambulatory Visit: Payer: Self-pay

## 2023-08-17 DIAGNOSIS — E119 Type 2 diabetes mellitus without complications: Secondary | ICD-10-CM

## 2023-08-18 DIAGNOSIS — D509 Iron deficiency anemia, unspecified: Secondary | ICD-10-CM | POA: Diagnosis not present

## 2023-08-18 DIAGNOSIS — I10 Essential (primary) hypertension: Secondary | ICD-10-CM | POA: Diagnosis not present

## 2023-08-18 DIAGNOSIS — E119 Type 2 diabetes mellitus without complications: Secondary | ICD-10-CM | POA: Diagnosis not present

## 2023-08-18 DIAGNOSIS — K9422 Gastrostomy infection: Secondary | ICD-10-CM | POA: Diagnosis not present

## 2023-08-18 DIAGNOSIS — L03311 Cellulitis of abdominal wall: Secondary | ICD-10-CM | POA: Diagnosis not present

## 2023-08-18 DIAGNOSIS — I2510 Atherosclerotic heart disease of native coronary artery without angina pectoris: Secondary | ICD-10-CM | POA: Diagnosis not present

## 2023-08-22 DIAGNOSIS — K9422 Gastrostomy infection: Secondary | ICD-10-CM | POA: Diagnosis not present

## 2023-08-22 DIAGNOSIS — L03311 Cellulitis of abdominal wall: Secondary | ICD-10-CM | POA: Diagnosis not present

## 2023-08-22 DIAGNOSIS — D509 Iron deficiency anemia, unspecified: Secondary | ICD-10-CM | POA: Diagnosis not present

## 2023-08-22 DIAGNOSIS — I2510 Atherosclerotic heart disease of native coronary artery without angina pectoris: Secondary | ICD-10-CM | POA: Diagnosis not present

## 2023-08-22 DIAGNOSIS — E119 Type 2 diabetes mellitus without complications: Secondary | ICD-10-CM | POA: Diagnosis not present

## 2023-08-22 DIAGNOSIS — I10 Essential (primary) hypertension: Secondary | ICD-10-CM | POA: Diagnosis not present

## 2023-08-23 DIAGNOSIS — I10 Essential (primary) hypertension: Secondary | ICD-10-CM | POA: Diagnosis not present

## 2023-08-23 DIAGNOSIS — I2510 Atherosclerotic heart disease of native coronary artery without angina pectoris: Secondary | ICD-10-CM | POA: Diagnosis not present

## 2023-08-23 DIAGNOSIS — E119 Type 2 diabetes mellitus without complications: Secondary | ICD-10-CM | POA: Diagnosis not present

## 2023-08-23 DIAGNOSIS — D509 Iron deficiency anemia, unspecified: Secondary | ICD-10-CM | POA: Diagnosis not present

## 2023-08-23 DIAGNOSIS — L03311 Cellulitis of abdominal wall: Secondary | ICD-10-CM | POA: Diagnosis not present

## 2023-08-23 DIAGNOSIS — K9422 Gastrostomy infection: Secondary | ICD-10-CM | POA: Diagnosis not present

## 2023-08-24 DIAGNOSIS — K9422 Gastrostomy infection: Secondary | ICD-10-CM | POA: Diagnosis not present

## 2023-08-24 DIAGNOSIS — D509 Iron deficiency anemia, unspecified: Secondary | ICD-10-CM | POA: Diagnosis not present

## 2023-08-24 DIAGNOSIS — L03311 Cellulitis of abdominal wall: Secondary | ICD-10-CM | POA: Diagnosis not present

## 2023-08-24 DIAGNOSIS — E119 Type 2 diabetes mellitus without complications: Secondary | ICD-10-CM | POA: Diagnosis not present

## 2023-08-24 DIAGNOSIS — I2510 Atherosclerotic heart disease of native coronary artery without angina pectoris: Secondary | ICD-10-CM | POA: Diagnosis not present

## 2023-08-24 DIAGNOSIS — I10 Essential (primary) hypertension: Secondary | ICD-10-CM | POA: Diagnosis not present

## 2023-08-25 DIAGNOSIS — N390 Urinary tract infection, site not specified: Secondary | ICD-10-CM | POA: Diagnosis not present

## 2023-08-29 DIAGNOSIS — I2510 Atherosclerotic heart disease of native coronary artery without angina pectoris: Secondary | ICD-10-CM | POA: Diagnosis not present

## 2023-08-29 DIAGNOSIS — D509 Iron deficiency anemia, unspecified: Secondary | ICD-10-CM | POA: Diagnosis not present

## 2023-08-29 DIAGNOSIS — K9422 Gastrostomy infection: Secondary | ICD-10-CM | POA: Diagnosis not present

## 2023-08-29 DIAGNOSIS — L03311 Cellulitis of abdominal wall: Secondary | ICD-10-CM | POA: Diagnosis not present

## 2023-08-29 DIAGNOSIS — E119 Type 2 diabetes mellitus without complications: Secondary | ICD-10-CM | POA: Diagnosis not present

## 2023-08-29 DIAGNOSIS — I10 Essential (primary) hypertension: Secondary | ICD-10-CM | POA: Diagnosis not present

## 2023-08-30 ENCOUNTER — Other Ambulatory Visit: Payer: Self-pay

## 2023-08-30 ENCOUNTER — Emergency Department
Admission: EM | Admit: 2023-08-30 | Discharge: 2023-08-30 | Disposition: A | Discharge status: Home / Self Care | Attending: Emergency Medicine | Admitting: Emergency Medicine

## 2023-08-30 DIAGNOSIS — K9422 Gastrostomy infection: Secondary | ICD-10-CM | POA: Insufficient documentation

## 2023-08-30 DIAGNOSIS — R4182 Altered mental status, unspecified: Secondary | ICD-10-CM | POA: Diagnosis not present

## 2023-08-30 DIAGNOSIS — D509 Iron deficiency anemia, unspecified: Secondary | ICD-10-CM | POA: Diagnosis not present

## 2023-08-30 DIAGNOSIS — K9423 Gastrostomy malfunction: Secondary | ICD-10-CM | POA: Diagnosis not present

## 2023-08-30 DIAGNOSIS — L0390 Cellulitis, unspecified: Secondary | ICD-10-CM | POA: Diagnosis not present

## 2023-08-30 DIAGNOSIS — I2510 Atherosclerotic heart disease of native coronary artery without angina pectoris: Secondary | ICD-10-CM | POA: Diagnosis not present

## 2023-08-30 DIAGNOSIS — I10 Essential (primary) hypertension: Secondary | ICD-10-CM | POA: Insufficient documentation

## 2023-08-30 DIAGNOSIS — L03311 Cellulitis of abdominal wall: Secondary | ICD-10-CM | POA: Diagnosis present

## 2023-08-30 DIAGNOSIS — E119 Type 2 diabetes mellitus without complications: Secondary | ICD-10-CM | POA: Diagnosis not present

## 2023-08-30 LAB — BASIC METABOLIC PANEL WITH GFR
Anion gap: 12 (ref 5–15)
BUN: 20 mg/dL (ref 8–23)
CO2: 23 mmol/L (ref 22–32)
Calcium: 10.1 mg/dL (ref 8.9–10.3)
Chloride: 105 mmol/L (ref 98–111)
Creatinine, Ser: 0.75 mg/dL (ref 0.44–1.00)
GFR, Estimated: >60 mL/min (ref >60)
Glucose, Bld: 94 mg/dL (ref 70–99)
Potassium: 3.9 mmol/L (ref 3.5–5.1)
Sodium: 140 mmol/L (ref 135–145)

## 2023-08-30 LAB — CBC
HCT: 36.7 % (ref 36.0–46.0)
Hemoglobin: 11.9 g/dL — ABNORMAL LOW (ref 12.0–15.0)
MCH: 28.3 pg (ref 26.0–34.0)
MCHC: 32.4 g/dL (ref 30.0–36.0)
MCV: 87.2 fL (ref 80.0–100.0)
Platelets: 324 K/uL (ref 150–400)
RBC: 4.21 MIL/uL (ref 3.87–5.11)
RDW: 14.6 % (ref 11.5–15.5)
WBC: 11.1 K/uL — ABNORMAL HIGH (ref 4.0–10.5)
nRBC: 0 % (ref 0.0–0.2)

## 2023-08-30 LAB — LACTIC ACID, PLASMA: Lactic Acid, Venous: 0.9 mmol/L (ref 0.5–1.9)

## 2023-08-30 MED ORDER — DOXYCYCLINE HYCLATE 100 MG PO TABS
100.0000 mg | ORAL_TABLET | Freq: Once | ORAL | Status: AC
  Administered 2023-08-30: 100 mg via ORAL
  Filled 2023-08-30: qty 1

## 2023-08-30 MED ORDER — DOXYCYCLINE HYCLATE 100 MG PO TABS: 100.0000 mg | ORAL_TABLET | Freq: Two times a day (BID) | ORAL | 0 refills | Status: AC

## 2023-08-30 NOTE — ED Provider Notes (Signed)
 [facility] Provider Note   Event Date/Time   First MD Initiated Contact with Patient 08/30/23 [name]     (approximate) History  port site issue  HPI Rita Taylor is a 83 y.o. female with a past medical history of hypertension, neurofibromatosis, hyperlipidemia, and cognitive deficits who presents with family member complaining of worsening redness around the skin around her G-tube site.  Patient's for member states that she has been seen and treated for this in the past however after being off antibiotics for a week this redness has returned and has become worse.  Family also states that patient is having worsening delirium in the evening time and is concerned that patient may have dementia. ROS: Unable to assess   Physical Exam  Triage Vital Signs: ED Triage Vitals  Encounter Vitals Group     BP 08/30/23 1643 (!) 186/78     Girls Systolic BP Percentile --      Girls Diastolic BP Percentile --      Boys Systolic BP Percentile --      Boys Diastolic BP Percentile --      Pulse Rate 08/30/23 1643 71     Resp 08/30/23 1643 18     Temp 08/30/23 1643 98 F (36.7 C)     Temp src --      SpO2 08/30/23 1643 100 %     Weight 08/30/23 1644 102 lb (46.3 kg)     Height 08/30/23 1644 5' 2 (1.575 m)     Head Circumference --      Peak Flow --      Pain Score 08/30/23 1643 0     Pain Loc --      Pain Education --      Exclude from Growth Chart --    Most recent vital signs: Vitals:   08/30/23 1643 08/30/23 1835  BP: (!) 186/78 (!) 183/86  Pulse: 71 87  Resp: 18 20  Temp: 98 F (36.7 C)   SpO2: 100% 100%   General: Awake, cooperative CV:  Good peripheral perfusion. Resp:  Normal effort. Abd:  No distention.  G-tube in place with surrounding erythema and induration to approximately 10 cm circumference Other:  Elderly well-developed, well-nourished Caucasian female resting comfortably in no acute distress ED Results / Procedures / Treatments  Labs (all  labs ordered are listed, but only abnormal results are displayed) Labs Reviewed  CBC - Abnormal; Notable for the following components:      Result Value   WBC 11.1 (*)    Hemoglobin 11.9 (*)    All other components within normal limits  CULTURE, BLOOD (ROUTINE X 2)  CULTURE, BLOOD (ROUTINE X 2)  BASIC METABOLIC PANEL WITH GFR  LACTIC ACID, PLASMA   PROCEDURES: Critical Care performed: No Procedures MEDICATIONS ORDERED IN ED: Medications  doxycycline  (VIBRA -TABS) tablet 100 mg (100 mg Oral Given 08/30/23 1916)   IMPRESSION / MDM / ASSESSMENT AND PLAN / ED COURSE  I reviewed the triage vital signs and the nursing notes.                             The patient is on the cardiac monitor to evaluate for evidence of arrhythmia and/or significant heart rate changes. Patient's presentation is most consistent with acute presentation with potential threat to life or bodily function. Presentation most consistent with simple cellulitis. Given History, Exam, and Workup I have low suspicion for Necrotizing  Fasciitis, Abscess, Osteomyelitis, DVT or other emergent problem as a cause for this presentation.  Rx: Doxycycline  100 mg twice daily x14 days  Disposition: Discharge. No evidence of serious bacterial illness. Nontoxic appearing, VSS. Low risk for treatment failure based on history. Strict return precautions discussed with patient with full understanding. Advised patient to follow up promptly with primary care provider within next 48 hours.   FINAL CLINICAL IMPRESSION(S) / ED DIAGNOSES   Final diagnoses:  Cellulitis at gastrostomy tube site [facility])  Cellulitis of abdominal wall   Rx / DC Orders   ED Discharge Orders          Ordered    doxycycline  (VIBRA -TABS) 100 MG tablet  2 times daily        08/30/23 1903           Note:  This document was prepared using Dragon voice recognition software and may include unintentional dictation errors.   Jossie Artist POUR, MD 08/30/23  [phone]

## 2023-08-30 NOTE — ED Notes (Signed)
 E-signature not working at this time. Pt daughter at bedside verbalized understanding of D/C instructions, prescriptions and follow up care with no further questions at this time. Pt in NAD and assisted into patient's daughters car who will be transporting her back to her facility.

## 2023-08-30 NOTE — ED Triage Notes (Addendum)
 Pt comes with port site infection. Pt was dx with infection and prescribed meds for it. Pt now might need blood work due to some new confusion and not sure if site is still infected.  Pt had neg uti test.   Pt is from [facility]

## 2023-08-30 NOTE — ED Notes (Signed)
 Blood cultures from [facility].

## 2023-09-01 DIAGNOSIS — L03311 Cellulitis of abdominal wall: Secondary | ICD-10-CM | POA: Diagnosis not present

## 2023-09-01 DIAGNOSIS — I2510 Atherosclerotic heart disease of native coronary artery without angina pectoris: Secondary | ICD-10-CM | POA: Diagnosis not present

## 2023-09-01 DIAGNOSIS — K9422 Gastrostomy infection: Secondary | ICD-10-CM | POA: Diagnosis not present

## 2023-09-01 DIAGNOSIS — L0390 Cellulitis, unspecified: Secondary | ICD-10-CM | POA: Diagnosis not present

## 2023-09-01 DIAGNOSIS — I10 Essential (primary) hypertension: Secondary | ICD-10-CM | POA: Diagnosis not present

## 2023-09-01 DIAGNOSIS — G8929 Other chronic pain: Secondary | ICD-10-CM | POA: Diagnosis not present

## 2023-09-01 DIAGNOSIS — R4182 Altered mental status, unspecified: Secondary | ICD-10-CM | POA: Diagnosis not present

## 2023-09-01 DIAGNOSIS — D509 Iron deficiency anemia, unspecified: Secondary | ICD-10-CM | POA: Diagnosis not present

## 2023-09-01 DIAGNOSIS — E119 Type 2 diabetes mellitus without complications: Secondary | ICD-10-CM | POA: Diagnosis not present

## 2023-09-04 LAB — CULTURE, BLOOD (ROUTINE X 2)
Culture: NO GROWTH
Culture: NO GROWTH
Special Requests: ADEQUATE

## 2023-09-05 DIAGNOSIS — Z79899 Other long term (current) drug therapy: Secondary | ICD-10-CM | POA: Diagnosis not present

## 2023-09-05 DIAGNOSIS — E782 Mixed hyperlipidemia: Secondary | ICD-10-CM | POA: Diagnosis not present

## 2023-09-05 DIAGNOSIS — L03311 Cellulitis of abdominal wall: Secondary | ICD-10-CM | POA: Diagnosis not present

## 2023-09-05 DIAGNOSIS — E119 Type 2 diabetes mellitus without complications: Secondary | ICD-10-CM | POA: Diagnosis not present

## 2023-09-05 DIAGNOSIS — I10 Essential (primary) hypertension: Secondary | ICD-10-CM | POA: Diagnosis not present

## 2023-09-05 DIAGNOSIS — D519 Vitamin B12 deficiency anemia, unspecified: Secondary | ICD-10-CM | POA: Diagnosis not present

## 2023-09-05 DIAGNOSIS — I2510 Atherosclerotic heart disease of native coronary artery without angina pectoris: Secondary | ICD-10-CM | POA: Diagnosis not present

## 2023-09-05 DIAGNOSIS — K9422 Gastrostomy infection: Secondary | ICD-10-CM | POA: Diagnosis not present

## 2023-09-05 DIAGNOSIS — D509 Iron deficiency anemia, unspecified: Secondary | ICD-10-CM | POA: Diagnosis not present

## 2023-09-05 DIAGNOSIS — R7309 Other abnormal glucose: Secondary | ICD-10-CM | POA: Diagnosis not present

## 2023-09-08 ENCOUNTER — Other Ambulatory Visit: Payer: Self-pay

## 2023-09-08 ENCOUNTER — Emergency Department
Admission: EM | Admit: 2023-09-08 | Discharge: 2023-09-08 | Disposition: A | Discharge status: Home / Self Care | Attending: Emergency Medicine | Admitting: Emergency Medicine

## 2023-09-08 DIAGNOSIS — R5383 Other fatigue: Secondary | ICD-10-CM | POA: Diagnosis not present

## 2023-09-08 DIAGNOSIS — E860 Dehydration: Secondary | ICD-10-CM | POA: Insufficient documentation

## 2023-09-08 DIAGNOSIS — I10 Essential (primary) hypertension: Secondary | ICD-10-CM | POA: Insufficient documentation

## 2023-09-08 DIAGNOSIS — F0390 Unspecified dementia without behavioral disturbance: Secondary | ICD-10-CM | POA: Diagnosis not present

## 2023-09-08 DIAGNOSIS — R443 Hallucinations, unspecified: Secondary | ICD-10-CM | POA: Diagnosis not present

## 2023-09-08 DIAGNOSIS — I959 Hypotension, unspecified: Secondary | ICD-10-CM | POA: Diagnosis not present

## 2023-09-08 DIAGNOSIS — R4182 Altered mental status, unspecified: Secondary | ICD-10-CM | POA: Diagnosis not present

## 2023-09-08 DIAGNOSIS — Z431 Encounter for attention to gastrostomy: Secondary | ICD-10-CM | POA: Diagnosis not present

## 2023-09-08 DIAGNOSIS — Z85820 Personal history of malignant melanoma of skin: Secondary | ICD-10-CM | POA: Insufficient documentation

## 2023-09-08 DIAGNOSIS — N390 Urinary tract infection, site not specified: Secondary | ICD-10-CM | POA: Diagnosis not present

## 2023-09-08 DIAGNOSIS — R531 Weakness: Secondary | ICD-10-CM | POA: Diagnosis not present

## 2023-09-08 LAB — CBC WITH DIFFERENTIAL/PLATELET
Abs Immature Granulocytes: 0.05 K/uL (ref 0.00–0.07)
Basophils Absolute: 0.1 K/uL (ref 0.0–0.1)
Basophils Relative: 1 %
Eosinophils Absolute: 0.1 K/uL (ref 0.0–0.5)
Eosinophils Relative: 1 %
HCT: 35.7 % — ABNORMAL LOW (ref 36.0–46.0)
Hemoglobin: 11.7 g/dL — ABNORMAL LOW (ref 12.0–15.0)
Immature Granulocytes: 0 %
Lymphocytes Relative: 19 %
Lymphs Abs: 2.3 K/uL (ref 0.7–4.0)
MCH: 28.6 pg (ref 26.0–34.0)
MCHC: 32.8 g/dL (ref 30.0–36.0)
MCV: 87.3 fL (ref 80.0–100.0)
Monocytes Absolute: 0.9 K/uL (ref 0.1–1.0)
Monocytes Relative: 7 %
Neutro Abs: 8.6 K/uL — ABNORMAL HIGH (ref 1.7–7.7)
Neutrophils Relative %: 72 %
Platelets: 302 K/uL (ref 150–400)
RBC: 4.09 MIL/uL (ref 3.87–5.11)
RDW: 14.9 % (ref 11.5–15.5)
WBC: 12 K/uL — ABNORMAL HIGH (ref 4.0–10.5)
nRBC: 0 % (ref 0.0–0.2)

## 2023-09-08 LAB — BASIC METABOLIC PANEL WITH GFR
Anion gap: 10 (ref 5–15)
BUN: 19 mg/dL (ref 8–23)
CO2: 24 mmol/L (ref 22–32)
Calcium: 9.9 mg/dL (ref 8.9–10.3)
Chloride: 106 mmol/L (ref 98–111)
Creatinine, Ser: 0.91 mg/dL (ref 0.44–1.00)
GFR, Estimated: >60 mL/min (ref >60)
Glucose, Bld: 104 mg/dL — ABNORMAL HIGH (ref 70–99)
Potassium: 3.7 mmol/L (ref 3.5–5.1)
Sodium: 140 mmol/L (ref 135–145)

## 2023-09-08 MED ORDER — SODIUM CHLORIDE 0.9 % IV BOLUS
1000.0000 mL | Freq: Once | INTRAVENOUS | Status: AC
  Administered 2023-09-08: 1000 mL via INTRAVENOUS

## 2023-09-08 NOTE — ED Provider Notes (Signed)
 [facility] Provider Note    Event Date/Time   First MD Initiated Contact with Patient 09/08/23 1228     (approximate)   History   Chief Complaint: Fatigue   HPI  Rita Taylor is a 83 y.o. female with a history of hypertension, dementia who was brought to the ED due to fatigue.  Staff at her memory care facility noticed that she seemed to have decreased energy level today and sent her to the ED for evaluation.  Per EMS they did urinalysis which was unremarkable.  No falls, no trauma, no fever.  Patient denies pain or other acute complaints  EMS reports patient had G-tube intentionally removed yesterday.      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[facility])     Current Outpatient Rx   Order #: [id number] Class: No Print   Order #: [id number] Class: No Print   Order #: [id number] Class: Historical Med   Order #: [id number] Class: No Print   Order #: [id number] Class: No Print   Order #: [id number] Class: No Print   Order #: [id number] Class: No Print   Order #: [id number] Class: No Print   Order #: [id number] Class: Normal   Order #: [id number] Class: No Print   Order #: [id number] Class: No Print   Order #: [id number] Class: No Print   Order #: [id number] Class: Normal   Order #: [id number] Class: No Print   Order #: [id number] Class: No Print   Order #: [id number] Class: No Print   Order #: [id number] Class: No Print   Order #: [id number] Class: No Print   Order #: [id number] Class: No Print   Order #: [id number] Class: Normal   Order #: [id number] Class: No Print   Order #: [id number] Class: No Print   Order #: [id number] Class: No Print    Past Surgical History:  Procedure Laterality Date   ANTERIOR CERVICAL DECOMP/DISCECTOMY FUSION N/A 04/01/2023    Procedure: ANTERIOR CERVICAL DECOMPRESSION/DISCECTOMY FUSION 1 LEVEL;  Surgeon: Clois Fret, MD;  Location: [facility] ORS;  Service: Neurosurgery;  Laterality: N/A;  C5-6 ACDF   APPLICATION OF INTRAOPERATIVE CT SCAN N/A 04/01/2023   Procedure: APPLICATION OF INTRAOPERATIVE CT SCAN;  Surgeon: Clois Fret, MD;  Location: [facility] ORS;  Service: Neurosurgery;  Laterality: N/A;   bitubal ligation  1981   BREAST ENHANCEMENT SURGERY  1990   BROW LIFT Left 01/24/2020   Procedure: TARSORRHAPHY, LATERAL PLACEMENT LEFT LOWER LID;  Surgeon: Ashley Greig HERO, MD;  Location: [facility];  Service: Ophthalmology;  Laterality: Left;   COLON SURGERY     COLONOSCOPY     ECTROPION REPAIR Left 03/02/2019   Procedure: ECTROPION REPAIR, EXTENSIVE AND TARSORRHAPHY, LATERAL PLACEMENT OF LEFT LOWER LID;  Surgeon: Ashley Greig HERO, MD;  Location: 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   Procedure: SHUNT INSERTION VENTRICULAR-PERITONEAL;  Surgeon: Clois Fret, MD;  Location: [facility] ORS;  Service: Neurosurgery;  Laterality: Right;    Physical Exam   Triage Vital Signs: ED Triage Vitals  Encounter Vitals Group     BP      Girls Systolic BP Percentile      Girls Diastolic BP Percentile      Boys Systolic BP Percentile      Boys Diastolic BP Percentile      Pulse      Resp      Temp      Temp src      SpO2      Weight      Height      Head Circumference       Peak Flow      Pain Score      Pain Loc      Pain Education      Exclude from Growth Chart     Most recent vital signs: Vitals:   09/08/23 1239  BP: (!) 135/54  Pulse: (!) 104  Resp: 18  Temp: 98 F (36.7 C)  SpO2: 100%    General: Awake, no distress.  CV:  Good peripheral perfusion.  Regular rate rhythm Resp:  Normal effort.  Clear to auscultation Abd:  No distention.  Soft nontender Other:  Dry oral mucosa   ED Results / Procedures / Treatments   Labs (all labs ordered are listed, but only abnormal results are displayed) Labs Reviewed  BASIC METABOLIC PANEL WITH GFR - Abnormal; Notable for the following components:      Result Value   Glucose, Bld 104 (*)    All other components within normal limits  CBC WITH DIFFERENTIAL/PLATELET - Abnormal; Notable for the following components:   WBC 12.0 (*)    Hemoglobin 11.7 (*)    HCT 35.7 (*)    Neutro Abs 8.6 (*)    All other components within normal limits     EKG Interpreted by me Sinus rhythm rate of 67.  Normal axis and intervals.  Poor R wave progression.  No acute ischemic changes.   RADIOLOGY    PROCEDURES:  Procedures   MEDICATIONS ORDERED IN ED: Medications  sodium chloride  0.9 % bolus 1,000 mL (1,000 mLs Intravenous New Bag/Given 09/08/23 1250)     IMPRESSION / MDM / ASSESSMENT AND PLAN / ED COURSE  I reviewed the triage vital signs and the nursing notes.  DDx: Dehydration, AKI, electrolyte derangement, anemia  Patient's presentation is most consistent with acute presentation with potential threat to life or bodily function.  Patient presents with fatigue today, back to baseline per EMS on arrival to the ED.  Patient is responsive, answers questions, moving comfortably.  Does have dry oral mucosa, will check labs and give IV fluids.       FINAL CLINICAL IMPRESSION(S) / ED DIAGNOSES   Final diagnoses:  Dehydration     Rx / DC Orders   ED Discharge Orders     None         Note:  This document was prepared using Dragon voice recognition software and may include unintentional dictation errors.   Viviann Pastor, MD 09/08/23 1346

## 2023-09-08 NOTE — ED Triage Notes (Signed)
 BIB EMS/ from [facility]/ seen at [facility] yesterday to have feeding tube removed/ facility reports pt being lethargic/ pt is alert and oriented x2 @  baseline/ CBG- 99/ 131/81/ 80HR/ 18RR/ 96% RA

## 2023-09-08 NOTE — Discharge Instructions (Signed)
 Examination and lab tests today were all okay.  Ms. Bobe does appear somewhat dehydrated. Please continue to encourage her to drink fluids.

## 2023-09-09 DIAGNOSIS — E119 Type 2 diabetes mellitus without complications: Secondary | ICD-10-CM | POA: Diagnosis not present

## 2023-09-09 DIAGNOSIS — I10 Essential (primary) hypertension: Secondary | ICD-10-CM | POA: Diagnosis not present

## 2023-09-09 DIAGNOSIS — K9422 Gastrostomy infection: Secondary | ICD-10-CM | POA: Diagnosis not present

## 2023-09-09 DIAGNOSIS — D509 Iron deficiency anemia, unspecified: Secondary | ICD-10-CM | POA: Diagnosis not present

## 2023-09-09 DIAGNOSIS — L0390 Cellulitis, unspecified: Secondary | ICD-10-CM | POA: Diagnosis not present

## 2023-09-09 DIAGNOSIS — R4182 Altered mental status, unspecified: Secondary | ICD-10-CM | POA: Diagnosis not present

## 2023-09-09 DIAGNOSIS — L03311 Cellulitis of abdominal wall: Secondary | ICD-10-CM | POA: Diagnosis not present

## 2023-09-09 DIAGNOSIS — I2510 Atherosclerotic heart disease of native coronary artery without angina pectoris: Secondary | ICD-10-CM | POA: Diagnosis not present

## 2023-09-09 DIAGNOSIS — R5383 Other fatigue: Secondary | ICD-10-CM | POA: Diagnosis not present

## 2023-09-12 DIAGNOSIS — K9422 Gastrostomy infection: Secondary | ICD-10-CM | POA: Diagnosis not present

## 2023-09-12 DIAGNOSIS — L03311 Cellulitis of abdominal wall: Secondary | ICD-10-CM | POA: Diagnosis not present

## 2023-09-12 DIAGNOSIS — M810 Age-related osteoporosis without current pathological fracture: Secondary | ICD-10-CM | POA: Diagnosis not present

## 2023-09-12 DIAGNOSIS — Z79891 Long term (current) use of opiate analgesic: Secondary | ICD-10-CM | POA: Diagnosis not present

## 2023-09-12 DIAGNOSIS — H5440 Blindness, one eye, unspecified eye: Secondary | ICD-10-CM | POA: Diagnosis not present

## 2023-09-12 DIAGNOSIS — I10 Essential (primary) hypertension: Secondary | ICD-10-CM | POA: Diagnosis not present

## 2023-09-12 DIAGNOSIS — F32A Depression, unspecified: Secondary | ICD-10-CM | POA: Diagnosis not present

## 2023-09-12 DIAGNOSIS — E785 Hyperlipidemia, unspecified: Secondary | ICD-10-CM | POA: Diagnosis not present

## 2023-09-12 DIAGNOSIS — G3184 Mild cognitive impairment, so stated: Secondary | ICD-10-CM | POA: Diagnosis not present

## 2023-09-12 DIAGNOSIS — Z85820 Personal history of malignant melanoma of skin: Secondary | ICD-10-CM | POA: Diagnosis not present

## 2023-09-12 DIAGNOSIS — M48061 Spinal stenosis, lumbar region without neurogenic claudication: Secondary | ICD-10-CM | POA: Diagnosis not present

## 2023-09-12 DIAGNOSIS — G510 Bell's palsy: Secondary | ICD-10-CM | POA: Diagnosis not present

## 2023-09-12 DIAGNOSIS — G8929 Other chronic pain: Secondary | ICD-10-CM | POA: Diagnosis not present

## 2023-09-12 DIAGNOSIS — R634 Abnormal weight loss: Secondary | ICD-10-CM | POA: Diagnosis not present

## 2023-09-12 DIAGNOSIS — I2510 Atherosclerotic heart disease of native coronary artery without angina pectoris: Secondary | ICD-10-CM | POA: Diagnosis not present

## 2023-09-12 DIAGNOSIS — S129XXD Fracture of neck, unspecified, subsequent encounter: Secondary | ICD-10-CM | POA: Diagnosis not present

## 2023-09-12 DIAGNOSIS — G911 Obstructive hydrocephalus: Secondary | ICD-10-CM | POA: Diagnosis not present

## 2023-09-12 DIAGNOSIS — Q8500 Neurofibromatosis, unspecified: Secondary | ICD-10-CM | POA: Diagnosis not present

## 2023-09-12 DIAGNOSIS — Z86011 Personal history of benign neoplasm of the brain: Secondary | ICD-10-CM | POA: Diagnosis not present

## 2023-09-12 DIAGNOSIS — D509 Iron deficiency anemia, unspecified: Secondary | ICD-10-CM | POA: Diagnosis not present

## 2023-09-12 DIAGNOSIS — E119 Type 2 diabetes mellitus without complications: Secondary | ICD-10-CM | POA: Diagnosis not present

## 2023-09-12 DIAGNOSIS — H109 Unspecified conjunctivitis: Secondary | ICD-10-CM | POA: Diagnosis not present

## 2023-09-13 ENCOUNTER — Telehealth: Payer: Self-pay

## 2023-09-13 DIAGNOSIS — E559 Vitamin D deficiency, unspecified: Secondary | ICD-10-CM | POA: Diagnosis not present

## 2023-09-13 DIAGNOSIS — L03311 Cellulitis of abdominal wall: Secondary | ICD-10-CM | POA: Diagnosis not present

## 2023-09-13 DIAGNOSIS — D509 Iron deficiency anemia, unspecified: Secondary | ICD-10-CM | POA: Diagnosis not present

## 2023-09-13 DIAGNOSIS — E119 Type 2 diabetes mellitus without complications: Secondary | ICD-10-CM | POA: Diagnosis not present

## 2023-09-13 DIAGNOSIS — E782 Mixed hyperlipidemia: Secondary | ICD-10-CM | POA: Diagnosis not present

## 2023-09-13 DIAGNOSIS — D519 Vitamin B12 deficiency anemia, unspecified: Secondary | ICD-10-CM | POA: Diagnosis not present

## 2023-09-13 DIAGNOSIS — I10 Essential (primary) hypertension: Secondary | ICD-10-CM | POA: Diagnosis not present

## 2023-09-13 DIAGNOSIS — E038 Other specified hypothyroidism: Secondary | ICD-10-CM | POA: Diagnosis not present

## 2023-09-13 DIAGNOSIS — K9422 Gastrostomy infection: Secondary | ICD-10-CM | POA: Diagnosis not present

## 2023-09-13 DIAGNOSIS — I2510 Atherosclerotic heart disease of native coronary artery without angina pectoris: Secondary | ICD-10-CM | POA: Diagnosis not present

## 2023-09-13 NOTE — Telephone Encounter (Signed)
 Prolia VOB initiated via [email or web address]  Next Prolia inj DUE: NOW

## 2023-09-13 NOTE — Telephone Encounter (Signed)
 Pt ready for scheduling for PROLIA  on or after : 09/13/23  Option# 1: Buy/Bill (Office supplied medication)  Out-of-pocket cost due at time of clinic visit: $0  Number of injection/visits approved: ---  Primary: MEDICARE Prolia  co-insurance: 0% Admin fee co-insurance: 0%  Secondary: [employer]MED[place] Prolia  co-insurance:  Admin fee co-insurance:   Medical Benefit Details: Date Benefits were checked: 09/13/23 Deductible: $257 Met of $257 Required/ Coinsurance: 0%/ Admin Fee: 0%  Prior Auth: N/A PA# Expiration Date:   # of doses approved: ----------------------------------------------------------------------- Option# 2- Med Obtained from pharmacy:  Pharmacy benefit: Copay $--- (Paid to pharmacy) Admin Fee: --- (Pay at clinic)  Prior Auth: --- PA# Expiration Date:   # of doses approved:   If patient wants fill through the pharmacy benefit please send prescription to: ---, and include estimated need by date in rx notes. Pharmacy will ship medication directly to the office.  Patient NOT eligible for Prolia  Copay Card. Copay Card can make patient's cost as little as $25. Link to apply: [email or web address]://[email or web address]  ** This summary of benefits is an estimation of the patient's out-of-pocket cost. Exact cost may very based on individual plan coverage.

## 2023-09-13 NOTE — Telephone Encounter (Signed)
 SABRA

## 2023-09-15 DIAGNOSIS — I2510 Atherosclerotic heart disease of native coronary artery without angina pectoris: Secondary | ICD-10-CM | POA: Diagnosis not present

## 2023-09-15 DIAGNOSIS — E860 Dehydration: Secondary | ICD-10-CM | POA: Diagnosis not present

## 2023-09-15 DIAGNOSIS — B379 Candidiasis, unspecified: Secondary | ICD-10-CM | POA: Diagnosis not present

## 2023-09-15 DIAGNOSIS — G8929 Other chronic pain: Secondary | ICD-10-CM | POA: Diagnosis not present

## 2023-09-21 DIAGNOSIS — K9422 Gastrostomy infection: Secondary | ICD-10-CM | POA: Diagnosis not present

## 2023-09-21 DIAGNOSIS — D509 Iron deficiency anemia, unspecified: Secondary | ICD-10-CM | POA: Diagnosis not present

## 2023-09-21 DIAGNOSIS — L03311 Cellulitis of abdominal wall: Secondary | ICD-10-CM | POA: Diagnosis not present

## 2023-09-21 DIAGNOSIS — E119 Type 2 diabetes mellitus without complications: Secondary | ICD-10-CM | POA: Diagnosis not present

## 2023-09-21 DIAGNOSIS — I10 Essential (primary) hypertension: Secondary | ICD-10-CM | POA: Diagnosis not present

## 2023-09-21 DIAGNOSIS — I2510 Atherosclerotic heart disease of native coronary artery without angina pectoris: Secondary | ICD-10-CM | POA: Diagnosis not present

## 2023-09-22 DIAGNOSIS — E038 Other specified hypothyroidism: Secondary | ICD-10-CM | POA: Diagnosis not present

## 2023-09-22 DIAGNOSIS — R011 Cardiac murmur, unspecified: Secondary | ICD-10-CM | POA: Diagnosis not present

## 2023-09-22 DIAGNOSIS — K9422 Gastrostomy infection: Secondary | ICD-10-CM | POA: Diagnosis not present

## 2023-09-22 DIAGNOSIS — E782 Mixed hyperlipidemia: Secondary | ICD-10-CM | POA: Diagnosis not present

## 2023-09-22 DIAGNOSIS — D509 Iron deficiency anemia, unspecified: Secondary | ICD-10-CM | POA: Diagnosis not present

## 2023-09-22 DIAGNOSIS — I2510 Atherosclerotic heart disease of native coronary artery without angina pectoris: Secondary | ICD-10-CM | POA: Diagnosis not present

## 2023-09-22 DIAGNOSIS — E559 Vitamin D deficiency, unspecified: Secondary | ICD-10-CM | POA: Diagnosis not present

## 2023-09-22 DIAGNOSIS — L03311 Cellulitis of abdominal wall: Secondary | ICD-10-CM | POA: Diagnosis not present

## 2023-09-22 DIAGNOSIS — R012 Other cardiac sounds: Secondary | ICD-10-CM | POA: Diagnosis not present

## 2023-09-22 DIAGNOSIS — E119 Type 2 diabetes mellitus without complications: Secondary | ICD-10-CM | POA: Diagnosis not present

## 2023-09-22 DIAGNOSIS — I10 Essential (primary) hypertension: Secondary | ICD-10-CM | POA: Diagnosis not present

## 2023-09-22 DIAGNOSIS — D519 Vitamin B12 deficiency anemia, unspecified: Secondary | ICD-10-CM | POA: Diagnosis not present

## 2023-09-26 DIAGNOSIS — Z79899 Other long term (current) drug therapy: Secondary | ICD-10-CM | POA: Diagnosis not present

## 2023-09-26 DIAGNOSIS — R7309 Other abnormal glucose: Secondary | ICD-10-CM | POA: Diagnosis not present

## 2023-09-26 DIAGNOSIS — D519 Vitamin B12 deficiency anemia, unspecified: Secondary | ICD-10-CM | POA: Diagnosis not present

## 2023-09-26 DIAGNOSIS — E782 Mixed hyperlipidemia: Secondary | ICD-10-CM | POA: Diagnosis not present

## 2023-09-27 DIAGNOSIS — I2510 Atherosclerotic heart disease of native coronary artery without angina pectoris: Secondary | ICD-10-CM | POA: Diagnosis not present

## 2023-09-27 DIAGNOSIS — D509 Iron deficiency anemia, unspecified: Secondary | ICD-10-CM | POA: Diagnosis not present

## 2023-09-27 DIAGNOSIS — I10 Essential (primary) hypertension: Secondary | ICD-10-CM | POA: Diagnosis not present

## 2023-09-27 DIAGNOSIS — K9422 Gastrostomy infection: Secondary | ICD-10-CM | POA: Diagnosis not present

## 2023-09-27 DIAGNOSIS — E119 Type 2 diabetes mellitus without complications: Secondary | ICD-10-CM | POA: Diagnosis not present

## 2023-09-27 DIAGNOSIS — L03311 Cellulitis of abdominal wall: Secondary | ICD-10-CM | POA: Diagnosis not present

## 2023-09-30 ENCOUNTER — Other Ambulatory Visit: Payer: Self-pay

## 2023-09-30 ENCOUNTER — Inpatient Hospital Stay
Admission: EM | Admit: 2023-09-30 | Discharge: 2023-10-07 | DRG: 070 | Disposition: A | Discharge status: Home Health | Source: Skilled Nursing Facility | Attending: Osteopathic Medicine | Admitting: Osteopathic Medicine

## 2023-09-30 ENCOUNTER — Emergency Department

## 2023-09-30 DIAGNOSIS — I4892 Unspecified atrial flutter: Secondary | ICD-10-CM | POA: Diagnosis not present

## 2023-09-30 DIAGNOSIS — R29818 Other symptoms and signs involving the nervous system: Secondary | ICD-10-CM

## 2023-09-30 DIAGNOSIS — I6523 Occlusion and stenosis of bilateral carotid arteries: Secondary | ICD-10-CM | POA: Diagnosis not present

## 2023-09-30 DIAGNOSIS — Z79890 Hormone replacement therapy (postmenopausal): Secondary | ICD-10-CM

## 2023-09-30 DIAGNOSIS — Z5329 Procedure and treatment not carried out because of patient's decision for other reasons: Secondary | ICD-10-CM | POA: Diagnosis present

## 2023-09-30 DIAGNOSIS — E876 Hypokalemia: Secondary | ICD-10-CM | POA: Diagnosis not present

## 2023-09-30 DIAGNOSIS — Q8502 Neurofibromatosis, type 2: Secondary | ICD-10-CM

## 2023-09-30 DIAGNOSIS — Z982 Presence of cerebrospinal fluid drainage device: Secondary | ICD-10-CM | POA: Diagnosis not present

## 2023-09-30 DIAGNOSIS — R404 Transient alteration of awareness: Secondary | ICD-10-CM | POA: Diagnosis not present

## 2023-09-30 DIAGNOSIS — Z681 Body mass index (BMI) 19 or less, adult: Secondary | ICD-10-CM

## 2023-09-30 DIAGNOSIS — Z7189 Other specified counseling: Secondary | ICD-10-CM | POA: Diagnosis not present

## 2023-09-30 DIAGNOSIS — Z789 Other specified health status: Secondary | ICD-10-CM | POA: Diagnosis not present

## 2023-09-30 DIAGNOSIS — Z79899 Other long term (current) drug therapy: Secondary | ICD-10-CM

## 2023-09-30 DIAGNOSIS — G936 Cerebral edema: Secondary | ICD-10-CM | POA: Diagnosis not present

## 2023-09-30 DIAGNOSIS — I10 Essential (primary) hypertension: Secondary | ICD-10-CM | POA: Diagnosis not present

## 2023-09-30 DIAGNOSIS — Z981 Arthrodesis status: Secondary | ICD-10-CM | POA: Diagnosis not present

## 2023-09-30 DIAGNOSIS — I700 Atherosclerosis of aorta: Secondary | ICD-10-CM | POA: Diagnosis not present

## 2023-09-30 DIAGNOSIS — G9341 Metabolic encephalopathy: Secondary | ICD-10-CM | POA: Diagnosis not present

## 2023-09-30 DIAGNOSIS — M810 Age-related osteoporosis without current pathological fracture: Secondary | ICD-10-CM | POA: Diagnosis present

## 2023-09-30 DIAGNOSIS — G935 Compression of brain: Secondary | ICD-10-CM | POA: Diagnosis present

## 2023-09-30 DIAGNOSIS — Z794 Long term (current) use of insulin: Secondary | ICD-10-CM | POA: Diagnosis not present

## 2023-09-30 DIAGNOSIS — G8194 Hemiplegia, unspecified affecting left nondominant side: Secondary | ICD-10-CM | POA: Diagnosis not present

## 2023-09-30 DIAGNOSIS — F32A Depression, unspecified: Secondary | ICD-10-CM | POA: Diagnosis not present

## 2023-09-30 DIAGNOSIS — R64 Cachexia: Secondary | ICD-10-CM | POA: Diagnosis present

## 2023-09-30 DIAGNOSIS — Z8249 Family history of ischemic heart disease and other diseases of the circulatory system: Secondary | ICD-10-CM

## 2023-09-30 DIAGNOSIS — F0393 Unspecified dementia, unspecified severity, with mood disturbance: Secondary | ICD-10-CM | POA: Diagnosis not present

## 2023-09-30 DIAGNOSIS — L89151 Pressure ulcer of sacral region, stage 1: Secondary | ICD-10-CM | POA: Diagnosis present

## 2023-09-30 DIAGNOSIS — G919 Hydrocephalus, unspecified: Secondary | ICD-10-CM | POA: Diagnosis present

## 2023-09-30 DIAGNOSIS — E785 Hyperlipidemia, unspecified: Secondary | ICD-10-CM | POA: Diagnosis present

## 2023-09-30 DIAGNOSIS — Z8619 Personal history of other infectious and parasitic diseases: Secondary | ICD-10-CM

## 2023-09-30 DIAGNOSIS — Z743 Need for continuous supervision: Secondary | ICD-10-CM | POA: Diagnosis not present

## 2023-09-30 DIAGNOSIS — E43 Unspecified severe protein-calorie malnutrition: Secondary | ICD-10-CM | POA: Diagnosis present

## 2023-09-30 DIAGNOSIS — E119 Type 2 diabetes mellitus without complications: Secondary | ICD-10-CM | POA: Diagnosis not present

## 2023-09-30 DIAGNOSIS — D333 Benign neoplasm of cranial nerves: Secondary | ICD-10-CM | POA: Diagnosis present

## 2023-09-30 DIAGNOSIS — G459 Transient cerebral ischemic attack, unspecified: Principal | ICD-10-CM | POA: Diagnosis present

## 2023-09-30 DIAGNOSIS — R1310 Dysphagia, unspecified: Secondary | ICD-10-CM | POA: Diagnosis not present

## 2023-09-30 DIAGNOSIS — G9389 Other specified disorders of brain: Secondary | ICD-10-CM | POA: Diagnosis not present

## 2023-09-30 DIAGNOSIS — Z85820 Personal history of malignant melanoma of skin: Secondary | ICD-10-CM

## 2023-09-30 DIAGNOSIS — Z7982 Long term (current) use of aspirin: Secondary | ICD-10-CM | POA: Diagnosis not present

## 2023-09-30 DIAGNOSIS — L8990 Pressure ulcer of unspecified site, unspecified stage: Secondary | ICD-10-CM | POA: Insufficient documentation

## 2023-09-30 DIAGNOSIS — F0390 Unspecified dementia without behavioral disturbance: Secondary | ICD-10-CM | POA: Diagnosis not present

## 2023-09-30 DIAGNOSIS — Z515 Encounter for palliative care: Secondary | ICD-10-CM

## 2023-09-30 DIAGNOSIS — Z66 Do not resuscitate: Secondary | ICD-10-CM | POA: Diagnosis not present

## 2023-09-30 DIAGNOSIS — Z83438 Family history of other disorder of lipoprotein metabolism and other lipidemia: Secondary | ICD-10-CM

## 2023-09-30 DIAGNOSIS — I959 Hypotension, unspecified: Secondary | ICD-10-CM | POA: Diagnosis not present

## 2023-09-30 DIAGNOSIS — G510 Bell's palsy: Secondary | ICD-10-CM | POA: Diagnosis present

## 2023-09-30 DIAGNOSIS — E0849 Diabetes mellitus due to underlying condition with other diabetic neurological complication: Secondary | ICD-10-CM | POA: Diagnosis present

## 2023-09-30 DIAGNOSIS — R4182 Altered mental status, unspecified: Principal | ICD-10-CM

## 2023-09-30 DIAGNOSIS — Z888 Allergy status to other drugs, medicaments and biological substances status: Secondary | ICD-10-CM

## 2023-09-30 LAB — URINALYSIS, W/ REFLEX TO CULTURE (INFECTION SUSPECTED)
Bacteria, UA: NONE SEEN
Bilirubin Urine: NEGATIVE
Glucose, UA: NEGATIVE mg/dL
Hgb urine dipstick: NEGATIVE
Ketones, ur: NEGATIVE mg/dL
Nitrite: NEGATIVE
Protein, ur: NEGATIVE mg/dL
Specific Gravity, Urine: 1.017 (ref 1.005–1.030)
pH: 6 (ref 5.0–8.0)

## 2023-09-30 LAB — CBC
HCT: 35 % — ABNORMAL LOW (ref 36.0–46.0)
Hemoglobin: 11.5 g/dL — ABNORMAL LOW (ref 12.0–15.0)
MCH: 28.8 pg (ref 26.0–34.0)
MCHC: 32.9 g/dL (ref 30.0–36.0)
MCV: 87.7 fL (ref 80.0–100.0)
Platelets: 300 K/uL (ref 150–400)
RBC: 3.99 MIL/uL (ref 3.87–5.11)
RDW: 15.3 % (ref 11.5–15.5)
WBC: 9.5 K/uL (ref 4.0–10.5)
nRBC: 0 % (ref 0.0–0.2)

## 2023-09-30 LAB — COMPREHENSIVE METABOLIC PANEL WITH GFR
ALT: 11 U/L (ref 0–44)
AST: 15 U/L (ref 15–41)
Albumin: 3.6 g/dL (ref 3.5–5.0)
Alkaline Phosphatase: 72 U/L (ref 38–126)
Anion gap: 10 (ref 5–15)
BUN: 26 mg/dL — ABNORMAL HIGH (ref 8–23)
CO2: 24 mmol/L (ref 22–32)
Calcium: 10.9 mg/dL — ABNORMAL HIGH (ref 8.9–10.3)
Chloride: 105 mmol/L (ref 98–111)
Creatinine, Ser: 1.08 mg/dL — ABNORMAL HIGH (ref 0.44–1.00)
GFR, Estimated: 51 mL/min — ABNORMAL LOW (ref >60)
Glucose, Bld: 148 mg/dL — ABNORMAL HIGH (ref 70–99)
Potassium: 3.6 mmol/L (ref 3.5–5.1)
Sodium: 139 mmol/L (ref 135–145)
Total Bilirubin: 0.8 mg/dL (ref 0.0–1.2)
Total Protein: 7 g/dL (ref 6.5–8.1)

## 2023-09-30 LAB — ETHANOL: Alcohol, Ethyl (B): <15 mg/dL (ref <15)

## 2023-09-30 LAB — AMMONIA: Ammonia: <13 umol/L (ref 9–35)

## 2023-09-30 MED ORDER — SODIUM CHLORIDE 0.9 % IV BOLUS
1000.0000 mL | Freq: Once | INTRAVENOUS | Status: AC
  Administered 2023-09-30: 1000 mL via INTRAVENOUS

## 2023-09-30 MED ORDER — OLANZAPINE 5 MG PO TBDP
5.0000 mg | ORAL_TABLET | Freq: Once | ORAL | Status: AC
  Administered 2023-09-30: 5 mg via ORAL
  Filled 2023-09-30: qty 1

## 2023-09-30 MED ORDER — IOHEXOL 350 MG/ML SOLN
75.0000 mL | Freq: Once | INTRAVENOUS | Status: AC | PRN
  Administered 2023-09-30: 75 mL via INTRAVENOUS

## 2023-09-30 NOTE — ED Triage Notes (Signed)
 Pt comes via EMS [facility] with AMs. Pt has dementia at baseline. Pt not speaking and normally does. Pt does have some left sided facial. Pt has hx of bells palsy. No weakness noted to other extremities.   CBG 130  Pt did not speak with EMS while in route either.

## 2023-09-30 NOTE — ED Triage Notes (Signed)
 Pt to ED via [facility] from [facility]. Facility reports dementia at baseline but pt is now not speaking. Pt able to tell RN name. Pt A&Ox1.  Pt with hx of bell's palsy. Pt appears to have paralysis to left side of face. When asked to raise eyebrows only right eyebrow moves. Pt has baseline visual issues out of left eye.

## 2023-09-30 NOTE — ED Notes (Signed)
 This tech witnessed pt at the end of the bed. Fall alarm and fall risk bracelet placed at this time. Pt has her shoes on.

## 2023-09-30 NOTE — ED Notes (Signed)
 Called CCMD to monitor
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
## 2023-10-01 ENCOUNTER — Observation Stay

## 2023-10-01 DIAGNOSIS — G459 Transient cerebral ischemic attack, unspecified: Secondary | ICD-10-CM | POA: Diagnosis not present

## 2023-10-01 DIAGNOSIS — I4892 Unspecified atrial flutter: Secondary | ICD-10-CM | POA: Diagnosis present

## 2023-10-01 LAB — LIPID PANEL
Cholesterol: 143 mg/dL (ref 0–200)
HDL: 34 mg/dL — ABNORMAL LOW (ref >40)
LDL Cholesterol: 79 mg/dL (ref 0–99)
Total CHOL/HDL Ratio: 4.2 ratio
Triglycerides: 148 mg/dL (ref <150)
VLDL: 30 mg/dL (ref 0–40)

## 2023-10-01 LAB — CBG MONITORING, ED
Glucose-Capillary: 80 mg/dL (ref 70–99)
Glucose-Capillary: 81 mg/dL (ref 70–99)
Glucose-Capillary: 82 mg/dL (ref 70–99)
Glucose-Capillary: 86 mg/dL (ref 70–99)
Glucose-Capillary: 87 mg/dL (ref 70–99)
Glucose-Capillary: 94 mg/dL (ref 70–99)

## 2023-10-01 LAB — HEMOGLOBIN A1C
Hgb A1c MFr Bld: 6.2 % — ABNORMAL HIGH (ref 4.8–5.6)
Mean Plasma Glucose: 131.24 mg/dL

## 2023-10-01 LAB — TROPONIN I (HIGH SENSITIVITY)
Troponin I (High Sensitivity): 6 ng/L (ref <18)
Troponin I (High Sensitivity): 5 ng/L (ref <18)

## 2023-10-01 LAB — GLUCOSE, CAPILLARY: Glucose-Capillary: 75 mg/dL (ref 70–99)

## 2023-10-01 MED ORDER — MIDAZOLAM HCL 2 MG/2ML IJ SOLN
1.0000 mg | Freq: Once | INTRAMUSCULAR | Status: AC | PRN
  Administered 2023-10-01: 1 mg via INTRAVENOUS
  Filled 2023-10-01: qty 2

## 2023-10-01 MED ORDER — POLYVINYL ALCOHOL 1.4 % OP SOLN: 1.0000 [drp] | OPHTHALMIC | Status: DC | PRN

## 2023-10-01 MED ORDER — SENNOSIDES-DOCUSATE SODIUM 8.6-50 MG PO TABS: 1.0000 | ORAL_TABLET | Freq: Every evening | ORAL | Status: DC | PRN

## 2023-10-01 MED ORDER — MIRTAZAPINE 15 MG PO TABS
7.5000 mg | ORAL_TABLET | Freq: Every day | ORAL | Status: DC
  Administered 2023-10-01 – 2023-10-03 (×4): 7.5 mg
  Filled 2023-10-01 (×4): qty 1

## 2023-10-01 MED ORDER — BUPROPION HCL 75 MG PO TABS
75.0000 mg | ORAL_TABLET | Freq: Every morning | ORAL | Status: DC
  Administered 2023-10-02 – 2023-10-03 (×2): 75 mg
  Filled 2023-10-01 (×3): qty 1

## 2023-10-01 MED ORDER — LIDOCAINE 5 % EX PTCH
2.0000 | MEDICATED_PATCH | CUTANEOUS | Status: DC
  Administered 2023-10-02 – 2023-10-06 (×5): 2 via TRANSDERMAL
  Filled 2023-10-01 (×6): qty 2

## 2023-10-01 MED ORDER — MEGESTROL ACETATE 400 MG/10ML PO SUSP
400.0000 mg | Freq: Every day | ORAL | Status: DC
  Administered 2023-10-02 – 2023-10-03 (×2): 400 mg
  Filled 2023-10-01 (×3): qty 10

## 2023-10-01 MED ORDER — POLYETHYLENE GLYCOL 3350 17 G PO PACK
17.0000 g | PACK | Freq: Every day | ORAL | Status: DC | PRN
  Administered 2023-10-07: 17 g via ORAL
  Filled 2023-10-01 (×2): qty 1

## 2023-10-01 MED ORDER — LOSARTAN POTASSIUM 50 MG PO TABS
100.0000 mg | ORAL_TABLET | Freq: Every day | ORAL | Status: DC
  Administered 2023-10-02 – 2023-10-03 (×2): 100 mg
  Filled 2023-10-01 (×2): qty 2

## 2023-10-01 MED ORDER — FAMOTIDINE 20 MG PO TABS
20.0000 mg | ORAL_TABLET | Freq: Every day | ORAL | Status: DC
  Administered 2023-10-02 – 2023-10-03 (×2): 20 mg
  Filled 2023-10-01 (×2): qty 1

## 2023-10-01 MED ORDER — LORAZEPAM 0.5 MG PO TABS
0.5000 mg | ORAL_TABLET | Freq: Once | ORAL | Status: AC
  Administered 2023-10-01: 0.5 mg via ORAL
  Filled 2023-10-01: qty 1

## 2023-10-01 MED ORDER — ENOXAPARIN SODIUM 30 MG/0.3ML IJ SOSY
30.0000 mg | PREFILLED_SYRINGE | INTRAMUSCULAR | Status: DC
  Administered 2023-10-01 – 2023-10-06 (×6): 30 mg via SUBCUTANEOUS
  Filled 2023-10-01 (×6): qty 0.3

## 2023-10-01 MED ORDER — ACETAMINOPHEN 325 MG PO TABS: 650.0000 mg | ORAL_TABLET | ORAL | Status: DC | PRN

## 2023-10-01 MED ORDER — STROKE: EARLY STAGES OF RECOVERY BOOK: Freq: Once | Status: DC

## 2023-10-01 MED ORDER — DOCUSATE SODIUM 50 MG/5ML PO LIQD
100.0000 mg | Freq: Two times a day (BID) | ORAL | Status: DC
  Administered 2023-10-02 – 2023-10-03 (×3): 100 mg
  Filled 2023-10-01 (×4): qty 10

## 2023-10-01 MED ORDER — INSULIN ASPART 100 UNIT/ML IJ SOLN
0.0000 [IU] | INTRAMUSCULAR | Status: DC
  Administered 2023-10-02 (×2): 1 [IU] via SUBCUTANEOUS
  Administered 2023-10-03: 2 [IU] via SUBCUTANEOUS
  Administered 2023-10-03: 1 [IU] via SUBCUTANEOUS
  Administered 2023-10-03: 2 [IU] via SUBCUTANEOUS
  Administered 2023-10-04: 1 [IU] via SUBCUTANEOUS
  Administered 2023-10-04 – 2023-10-05 (×3): 2 [IU] via SUBCUTANEOUS
  Administered 2023-10-05: 3 [IU] via SUBCUTANEOUS
  Administered 2023-10-05 – 2023-10-06 (×2): 1 [IU] via SUBCUTANEOUS
  Administered 2023-10-06: 2 [IU] via SUBCUTANEOUS
  Filled 2023-10-01 (×13): qty 1

## 2023-10-01 MED ORDER — CARVEDILOL 25 MG PO TABS
25.0000 mg | ORAL_TABLET | Freq: Two times a day (BID) | ORAL | Status: DC
  Administered 2023-10-02 – 2023-10-03 (×3): 25 mg
  Filled 2023-10-01 (×3): qty 1

## 2023-10-01 MED ORDER — INSULIN ASPART 100 UNIT/ML IJ SOLN: 0.0000 [IU] | INTRAMUSCULAR | Status: DC

## 2023-10-01 MED ORDER — PRAVASTATIN SODIUM 40 MG PO TABS
40.0000 mg | ORAL_TABLET | Freq: Every day | ORAL | Status: DC
  Administered 2023-10-02: 40 mg
  Filled 2023-10-01 (×2): qty 1

## 2023-10-01 MED ORDER — OLANZAPINE 5 MG PO TBDP
5.0000 mg | ORAL_TABLET | Freq: Every day | ORAL | Status: AC
  Administered 2023-10-01: 5 mg via ORAL
  Filled 2023-10-01: qty 1

## 2023-10-01 MED ORDER — MELATONIN 5 MG PO TABS
5.0000 mg | ORAL_TABLET | Freq: Every day | ORAL | Status: DC
  Administered 2023-10-01 – 2023-10-06 (×7): 5 mg via ORAL
  Filled 2023-10-01 (×7): qty 1

## 2023-10-01 MED ORDER — SODIUM CHLORIDE 0.9 % IV SOLN: INTRAVENOUS | Status: AC

## 2023-10-01 MED ORDER — VITAMIN B-12 1000 MCG PO TABS
1000.0000 ug | ORAL_TABLET | Freq: Every day | ORAL | Status: DC
  Administered 2023-10-02 – 2023-10-03 (×2): 1000 ug
  Filled 2023-10-01 (×2): qty 1

## 2023-10-01 MED ORDER — ASPIRIN 81 MG PO CHEW
81.0000 mg | CHEWABLE_TABLET | Freq: Every day | ORAL | Status: DC
  Administered 2023-10-02 – 2023-10-03 (×2): 81 mg
  Filled 2023-10-01 (×2): qty 1

## 2023-10-01 MED ORDER — METHOCARBAMOL 500 MG PO TABS: 500.0000 mg | ORAL_TABLET | Freq: Three times a day (TID) | ORAL | Status: DC | PRN

## 2023-10-01 MED ORDER — ACETAMINOPHEN 160 MG/5ML PO SOLN: 650.0000 mg | ORAL | Status: DC | PRN

## 2023-10-01 MED ORDER — LORAZEPAM 2 MG/ML IJ SOLN: 0.5000 mg | Freq: Once | INTRAMUSCULAR | Status: DC | PRN

## 2023-10-01 MED ORDER — ACETAMINOPHEN 650 MG RE SUPP: 650.0000 mg | RECTAL | Status: DC | PRN
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
## 2023-10-01 NOTE — Assessment & Plan Note (Signed)
 Appears stable in size on CT Check MRI

## 2023-10-01 NOTE — Progress Notes (Signed)
 Anticoagulation monitoring(Lovenox ):  83 yo female ordered Lovenox  40 mg Q24h    Filed Weights   10/01/23 0042  Weight: 47.9 kg (105 lb 8 oz)   BMI 19.3    Lab Results  Component Value Date   CREATININE 1.08 (H) 09/30/2023   CREATININE 0.91 09/08/2023   CREATININE 0.75 08/30/2023   Estimated Creatinine Clearance: 29.8 mL/min (A) (by C-G formula based on SCr of 1.08 mg/dL (H)). Hemoglobin & Hematocrit     Component Value Date/Time   HGB 11.5 (L) 09/30/2023 1805   HGB 10.1 (L) 03/07/2023 0909   HCT 35.0 (L) 09/30/2023 1805     Per Protocol for Patient with estCrcl < 30 ml/min and BMI < 30, will transition to Lovenox  30 mg Q24h.
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
## 2023-10-01 NOTE — Care Management Obs Status (Signed)
 MEDICARE OBSERVATION STATUS NOTIFICATION   Patient Details  Name: Rita Taylor MRN: [id number] Date of Birth: [birth year]   Medicare Observation Status Notification Given:  No (spoke with daughter, Rita Taylor)    Rojelio SHAUNNA Rattler 10/01/2023, 12:54 PM

## 2023-10-01 NOTE — Assessment & Plan Note (Signed)
 Telemetry Check MRI PT OT and speech Safety sitter Continue aspirin 

## 2023-10-01 NOTE — Assessment & Plan Note (Signed)
 Continue pravastatin 

## 2023-10-01 NOTE — Progress Notes (Addendum)
 PT Cancellation Note  Patient Details Name: KORALEE WEDEKING MRN: [id number] DOB: [birth year]   Cancelled Treatment:    Reason Eval/Treat Not Completed: Other (comment). PT orders received and pt chart reviewed. Pt asleep upon entry; difficult to arouse with lights on and verbal/tactile stimuli. Unable to follow directions/open eyes. Will follow up with PT eval when pt is able to actively and purposefully participate.  ADDENDUM 1240: Pt asleep upon entry with RN at bedside. Pt continues to be difficult to wake despite noxious stimuli. Will continue PT efforts when pt is able to actively and purposefully participate.   Camie CHARLENA Kluver, PT, DPT 1:50 PM,10/01/23 Physical Therapist - [place] [facility]

## 2023-10-01 NOTE — Assessment & Plan Note (Signed)
 Sliding scale insulin.
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
## 2023-10-01 NOTE — Assessment & Plan Note (Addendum)
 Found on EKG Ventricular rate controlled Check troponins Consider cardiology consultation.

## 2023-10-01 NOTE — Assessment & Plan Note (Signed)
 Stable imaging Check MRI of the brain

## 2023-10-01 NOTE — Progress Notes (Signed)
 pt attempted for THIRD time to scan MRI head with meds; pt calm until started to scan; then tried began crawling out of the scanner.

## 2023-10-01 NOTE — Progress Notes (Signed)
 pt unable to hold still for MRI scan; attempted x2; meds given multiple times
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
## 2023-10-01 NOTE — Assessment & Plan Note (Signed)
 Continue Wellbutrin  Continue Remeron 

## 2023-10-01 NOTE — Assessment & Plan Note (Signed)
 Continue Megace 
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
## 2023-10-01 NOTE — Assessment & Plan Note (Signed)
 Continue carvedilol , Cozaar 

## 2023-10-02 DIAGNOSIS — G459 Transient cerebral ischemic attack, unspecified: Secondary | ICD-10-CM | POA: Diagnosis not present

## 2023-10-02 LAB — CBC WITH DIFFERENTIAL/PLATELET
Abs Immature Granulocytes: 0.04 K/uL (ref 0.00–0.07)
Basophils Absolute: 0.1 K/uL (ref 0.0–0.1)
Basophils Relative: 1 %
Eosinophils Absolute: 0.1 K/uL (ref 0.0–0.5)
Eosinophils Relative: 1 %
HCT: 35 % — ABNORMAL LOW (ref 36.0–46.0)
Hemoglobin: 11.7 g/dL — ABNORMAL LOW (ref 12.0–15.0)
Immature Granulocytes: 0 %
Lymphocytes Relative: 23 %
Lymphs Abs: 2.6 K/uL (ref 0.7–4.0)
MCH: 29.3 pg (ref 26.0–34.0)
MCHC: 33.4 g/dL (ref 30.0–36.0)
MCV: 87.5 fL (ref 80.0–100.0)
Monocytes Absolute: 0.8 K/uL (ref 0.1–1.0)
Monocytes Relative: 7 %
Neutro Abs: 7.8 K/uL — ABNORMAL HIGH (ref 1.7–7.7)
Neutrophils Relative %: 68 %
Platelets: 287 K/uL (ref 150–400)
RBC: 4 MIL/uL (ref 3.87–5.11)
RDW: 15.3 % (ref 11.5–15.5)
WBC: 11.5 K/uL — ABNORMAL HIGH (ref 4.0–10.5)
nRBC: 0 % (ref 0.0–0.2)

## 2023-10-02 LAB — GLUCOSE, CAPILLARY
Glucose-Capillary: 114 mg/dL — ABNORMAL HIGH (ref 70–99)
Glucose-Capillary: 122 mg/dL — ABNORMAL HIGH (ref 70–99)
Glucose-Capillary: 149 mg/dL — ABNORMAL HIGH (ref 70–99)
Glucose-Capillary: 71 mg/dL (ref 70–99)
Glucose-Capillary: 80 mg/dL (ref 70–99)
Glucose-Capillary: 90 mg/dL (ref 70–99)

## 2023-10-02 LAB — BASIC METABOLIC PANEL WITH GFR
Anion gap: 13 (ref 5–15)
BUN: 17 mg/dL (ref 8–23)
CO2: 21 mmol/L — ABNORMAL LOW (ref 22–32)
Calcium: 10.7 mg/dL — ABNORMAL HIGH (ref 8.9–10.3)
Chloride: 107 mmol/L (ref 98–111)
Creatinine, Ser: 0.69 mg/dL (ref 0.44–1.00)
GFR, Estimated: >60 mL/min (ref >60)
Glucose, Bld: 79 mg/dL (ref 70–99)
Potassium: 3.5 mmol/L (ref 3.5–5.1)
Sodium: 141 mmol/L (ref 135–145)

## 2023-10-02 LAB — THYROID PANEL WITH TSH
Free Thyroxine Index: 2.4 (ref 1.2–4.9)
T3 Uptake Ratio: 33 % (ref 24–39)
T4, Total: 7.3 ug/dL (ref 4.5–12.0)
TSH: 1.08 u[IU]/mL (ref 0.450–4.500)
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
## 2023-10-02 NOTE — Plan of Care (Signed)
  Problem: Education: Goal: Knowledge of disease or condition will improve Outcome: Progressing Goal: Knowledge of secondary prevention will improve (MUST DOCUMENT ALL) Outcome: Progressing Goal: Knowledge of patient specific risk factors will improve (DELETE if not current risk factor) Outcome: Progressing   Problem: Ischemic Stroke/TIA Tissue Perfusion: Goal: Complications of ischemic stroke/TIA will be minimized Outcome: Progressing   Problem: Coping: Goal: Will verbalize positive feelings about self Outcome: Progressing Goal: Will identify appropriate support needs Outcome: Progressing   Problem: Self-Care: Goal: Ability to participate in self-care as condition permits will improve Outcome: Progressing Goal: Verbalization of feelings and concerns over difficulty with self-care will improve Outcome: Progressing Goal: Ability to communicate needs accurately will improve Outcome: Progressing   Problem: Fluid Volume: Goal: Ability to maintain a balanced intake and output will improve Outcome: Progressing   Problem: Metabolic: Goal: Ability to maintain appropriate glucose levels will improve Outcome: Progressing   Problem: Nutritional: Goal: Maintenance of adequate nutrition will improve Outcome: Progressing Goal: Progress toward achieving an optimal weight will improve Outcome: Progressing   Problem: Skin Integrity: Goal: Risk for impaired skin integrity will decrease Outcome: Progressing   Problem: Tissue Perfusion: Goal: Adequacy of tissue perfusion will improve Outcome: Progressing   Problem: Activity: Goal: Risk for activity intolerance will decrease Outcome: Progressing   Problem: Pain Managment: Goal: General experience of comfort will improve and/or be controlled Outcome: Progressing   Problem: Safety: Goal: Ability to remain free from injury will improve Outcome: Progressing   Problem: Skin Integrity: Goal: Risk for impaired skin integrity will  decrease Outcome: Progressing
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
## 2023-10-03 ENCOUNTER — Observation Stay

## 2023-10-03 DIAGNOSIS — G9341 Metabolic encephalopathy: Secondary | ICD-10-CM | POA: Diagnosis present

## 2023-10-03 DIAGNOSIS — Q8502 Neurofibromatosis, type 2: Secondary | ICD-10-CM | POA: Diagnosis not present

## 2023-10-03 DIAGNOSIS — G935 Compression of brain: Secondary | ICD-10-CM | POA: Diagnosis present

## 2023-10-03 DIAGNOSIS — I4892 Unspecified atrial flutter: Secondary | ICD-10-CM | POA: Diagnosis present

## 2023-10-03 DIAGNOSIS — Z66 Do not resuscitate: Secondary | ICD-10-CM | POA: Diagnosis present

## 2023-10-03 DIAGNOSIS — G936 Cerebral edema: Secondary | ICD-10-CM | POA: Diagnosis not present

## 2023-10-03 DIAGNOSIS — Z681 Body mass index (BMI) 19 or less, adult: Secondary | ICD-10-CM | POA: Diagnosis not present

## 2023-10-03 DIAGNOSIS — G919 Hydrocephalus, unspecified: Secondary | ICD-10-CM | POA: Diagnosis present

## 2023-10-03 DIAGNOSIS — R64 Cachexia: Secondary | ICD-10-CM | POA: Diagnosis present

## 2023-10-03 DIAGNOSIS — Z789 Other specified health status: Secondary | ICD-10-CM | POA: Diagnosis not present

## 2023-10-03 DIAGNOSIS — I10 Essential (primary) hypertension: Secondary | ICD-10-CM | POA: Diagnosis present

## 2023-10-03 DIAGNOSIS — Z794 Long term (current) use of insulin: Secondary | ICD-10-CM | POA: Diagnosis not present

## 2023-10-03 DIAGNOSIS — Z7189 Other specified counseling: Secondary | ICD-10-CM | POA: Diagnosis not present

## 2023-10-03 DIAGNOSIS — R1310 Dysphagia, unspecified: Secondary | ICD-10-CM | POA: Diagnosis present

## 2023-10-03 DIAGNOSIS — E43 Unspecified severe protein-calorie malnutrition: Secondary | ICD-10-CM | POA: Diagnosis present

## 2023-10-03 DIAGNOSIS — L8990 Pressure ulcer of unspecified site, unspecified stage: Secondary | ICD-10-CM | POA: Insufficient documentation

## 2023-10-03 DIAGNOSIS — E119 Type 2 diabetes mellitus without complications: Secondary | ICD-10-CM | POA: Diagnosis present

## 2023-10-03 DIAGNOSIS — Z515 Encounter for palliative care: Secondary | ICD-10-CM | POA: Diagnosis not present

## 2023-10-03 DIAGNOSIS — Z79899 Other long term (current) drug therapy: Secondary | ICD-10-CM | POA: Diagnosis not present

## 2023-10-03 DIAGNOSIS — L89151 Pressure ulcer of sacral region, stage 1: Secondary | ICD-10-CM | POA: Diagnosis present

## 2023-10-03 DIAGNOSIS — R4182 Altered mental status, unspecified: Secondary | ICD-10-CM | POA: Diagnosis not present

## 2023-10-03 DIAGNOSIS — Z7982 Long term (current) use of aspirin: Secondary | ICD-10-CM | POA: Diagnosis not present

## 2023-10-03 DIAGNOSIS — F32A Depression, unspecified: Secondary | ICD-10-CM | POA: Diagnosis present

## 2023-10-03 DIAGNOSIS — E785 Hyperlipidemia, unspecified: Secondary | ICD-10-CM | POA: Diagnosis present

## 2023-10-03 DIAGNOSIS — Z85820 Personal history of malignant melanoma of skin: Secondary | ICD-10-CM | POA: Diagnosis not present

## 2023-10-03 DIAGNOSIS — G459 Transient cerebral ischemic attack, unspecified: Secondary | ICD-10-CM | POA: Diagnosis not present

## 2023-10-03 DIAGNOSIS — F0393 Unspecified dementia, unspecified severity, with mood disturbance: Secondary | ICD-10-CM | POA: Diagnosis present

## 2023-10-03 DIAGNOSIS — G8194 Hemiplegia, unspecified affecting left nondominant side: Secondary | ICD-10-CM | POA: Diagnosis present

## 2023-10-03 DIAGNOSIS — Z8249 Family history of ischemic heart disease and other diseases of the circulatory system: Secondary | ICD-10-CM | POA: Diagnosis not present

## 2023-10-03 LAB — CBC WITH DIFFERENTIAL/PLATELET
Abs Immature Granulocytes: 0.02 K/uL (ref 0.00–0.07)
Basophils Absolute: 0.1 K/uL (ref 0.0–0.1)
Basophils Relative: 1 %
Eosinophils Absolute: 0.1 K/uL (ref 0.0–0.5)
Eosinophils Relative: 1 %
HCT: 34 % — ABNORMAL LOW (ref 36.0–46.0)
Hemoglobin: 11.5 g/dL — ABNORMAL LOW (ref 12.0–15.0)
Immature Granulocytes: 0 %
Lymphocytes Relative: 34 %
Lymphs Abs: 3.1 K/uL (ref 0.7–4.0)
MCH: 28.8 pg (ref 26.0–34.0)
MCHC: 33.8 g/dL (ref 30.0–36.0)
MCV: 85 fL (ref 80.0–100.0)
Monocytes Absolute: 0.7 K/uL (ref 0.1–1.0)
Monocytes Relative: 8 %
Neutro Abs: 5.1 K/uL (ref 1.7–7.7)
Neutrophils Relative %: 56 %
Platelets: 305 K/uL (ref 150–400)
RBC: 4 MIL/uL (ref 3.87–5.11)
RDW: 14.9 % (ref 11.5–15.5)
WBC: 9.1 K/uL (ref 4.0–10.5)
nRBC: 0 % (ref 0.0–0.2)

## 2023-10-03 LAB — BASIC METABOLIC PANEL WITH GFR
Anion gap: 11 (ref 5–15)
BUN: 18 mg/dL (ref 8–23)
CO2: 25 mmol/L (ref 22–32)
Calcium: 10.4 mg/dL — ABNORMAL HIGH (ref 8.9–10.3)
Chloride: 109 mmol/L (ref 98–111)
Creatinine, Ser: 0.8 mg/dL (ref 0.44–1.00)
GFR, Estimated: >60 mL/min (ref >60)
Glucose, Bld: 99 mg/dL (ref 70–99)
Potassium: 3.2 mmol/L — ABNORMAL LOW (ref 3.5–5.1)
Sodium: 145 mmol/L (ref 135–145)

## 2023-10-03 LAB — GLUCOSE, CAPILLARY
Glucose-Capillary: 110 mg/dL — ABNORMAL HIGH (ref 70–99)
Glucose-Capillary: 123 mg/dL — ABNORMAL HIGH (ref 70–99)
Glucose-Capillary: 139 mg/dL — ABNORMAL HIGH (ref 70–99)
Glucose-Capillary: 158 mg/dL — ABNORMAL HIGH (ref 70–99)
Glucose-Capillary: 164 mg/dL — ABNORMAL HIGH (ref 70–99)
Glucose-Capillary: 96 mg/dL (ref 70–99)

## 2023-10-03 MED ORDER — VITAMIN B-12 1000 MCG PO TABS
1000.0000 ug | ORAL_TABLET | Freq: Every day | ORAL | Status: DC
  Administered 2023-10-04 – 2023-10-06 (×3): 1000 ug via ORAL
  Filled 2023-10-03 (×3): qty 1

## 2023-10-03 MED ORDER — DOCUSATE SODIUM 100 MG PO CAPS
100.0000 mg | ORAL_CAPSULE | Freq: Two times a day (BID) | ORAL | Status: DC
  Administered 2023-10-03 – 2023-10-06 (×4): 100 mg via ORAL
  Filled 2023-10-03 (×6): qty 1

## 2023-10-03 MED ORDER — PRAVASTATIN SODIUM 40 MG PO TABS
40.0000 mg | ORAL_TABLET | Freq: Every day | ORAL | Status: DC
Start: 2023-10-03 — End: 2023-10-04
  Administered 2023-10-03: 40 mg via ORAL
  Filled 2023-10-03: qty 1

## 2023-10-03 MED ORDER — POTASSIUM CHLORIDE CRYS ER 20 MEQ PO TBCR
40.0000 meq | EXTENDED_RELEASE_TABLET | ORAL | Status: AC
  Administered 2023-10-03 (×2): 40 meq via ORAL
  Filled 2023-10-03 (×2): qty 2

## 2023-10-03 MED ORDER — ACETAMINOPHEN 650 MG RE SUPP: 650.0000 mg | RECTAL | Status: DC | PRN

## 2023-10-03 MED ORDER — MIRTAZAPINE 15 MG PO TABS
7.5000 mg | ORAL_TABLET | Freq: Every day | ORAL | Status: DC
Start: 2023-10-04 — End: 2023-10-07
  Administered 2023-10-04 – 2023-10-06 (×3): 7.5 mg via ORAL
  Filled 2023-10-03 (×3): qty 1

## 2023-10-03 MED ORDER — FAMOTIDINE 20 MG PO TABS
20.0000 mg | ORAL_TABLET | Freq: Every day | ORAL | Status: DC
  Administered 2023-10-04 – 2023-10-07 (×4): 20 mg via ORAL
  Filled 2023-10-03 (×4): qty 1

## 2023-10-03 MED ORDER — LOSARTAN POTASSIUM 50 MG PO TABS
100.0000 mg | ORAL_TABLET | Freq: Every day | ORAL | Status: DC
  Administered 2023-10-04 – 2023-10-07 (×4): 100 mg via ORAL
  Filled 2023-10-03 (×4): qty 2

## 2023-10-03 MED ORDER — ACETAMINOPHEN 160 MG/5ML PO SOLN: 650.0000 mg | ORAL | Status: DC | PRN

## 2023-10-03 MED ORDER — ASPIRIN 81 MG PO CHEW
81.0000 mg | CHEWABLE_TABLET | Freq: Every day | ORAL | Status: DC
  Administered 2023-10-04 – 2023-10-07 (×4): 81 mg via ORAL
  Filled 2023-10-03 (×4): qty 1

## 2023-10-03 MED ORDER — BUPROPION HCL 75 MG PO TABS
75.0000 mg | ORAL_TABLET | Freq: Every morning | ORAL | Status: DC
Start: 2023-10-04 — End: 2023-10-04
  Administered 2023-10-04: 75 mg via ORAL
  Filled 2023-10-03: qty 1

## 2023-10-03 MED ORDER — CARVEDILOL 25 MG PO TABS
25.0000 mg | ORAL_TABLET | Freq: Two times a day (BID) | ORAL | Status: DC
  Administered 2023-10-03 – 2023-10-07 (×8): 25 mg via ORAL
  Filled 2023-10-03 (×8): qty 1

## 2023-10-03 MED ORDER — METHOCARBAMOL 500 MG PO TABS: 500.0000 mg | ORAL_TABLET | Freq: Three times a day (TID) | ORAL | Status: DC | PRN

## 2023-10-03 MED ORDER — MEGESTROL ACETATE 400 MG/10ML PO SUSP
400.0000 mg | Freq: Every day | ORAL | Status: DC
  Administered 2023-10-04 – 2023-10-06 (×3): 400 mg via ORAL
  Filled 2023-10-03 (×4): qty 10

## 2023-10-03 MED ORDER — ACETAMINOPHEN 325 MG PO TABS
650.0000 mg | ORAL_TABLET | ORAL | Status: DC | PRN
  Administered 2023-10-07: 650 mg via ORAL
  Filled 2023-10-03 (×2): qty 2

## 2023-10-03 NOTE — Plan of Care (Signed)
  Problem: Education: Goal: Knowledge of disease or condition will improve 10/03/2023 1437 by Gwenn Ronal Niemann, RN Outcome: Progressing 10/03/2023 1437 by Gwenn Ronal Niemann, RN Outcome: Progressing Goal: Knowledge of secondary prevention will improve (MUST DOCUMENT ALL) 10/03/2023 1437 by Gwenn Ronal Niemann, RN Outcome: Progressing 10/03/2023 1437 by Gwenn Ronal Niemann, RN Outcome: Progressing Goal: Knowledge of patient specific risk factors will improve (DELETE if not current risk factor) 10/03/2023 1437 by Gwenn Ronal Niemann, RN Outcome: Progressing 10/03/2023 1437 by Gwenn Ronal Niemann, RN Outcome: Progressing   Problem: Ischemic Stroke/TIA Tissue Perfusion: Goal: Complications of ischemic stroke/TIA will be minimized 10/03/2023 1437 by Gwenn Ronal Niemann, RN Outcome: Progressing 10/03/2023 1437 by Gwenn Ronal Niemann, RN Outcome: Progressing   Problem: Coping: Goal: Will verbalize positive feelings about self Outcome: Progressing Goal: Will identify appropriate support needs Outcome: Progressing   Problem: Health Behavior/Discharge Planning: Goal: Ability to manage health-related needs will improve Outcome: Progressing Goal: Goals will be collaboratively established with patient/family Outcome: Progressing   Problem: Self-Care: Goal: Ability to participate in self-care as condition permits will improve Outcome: Progressing Goal: Verbalization of feelings and concerns over difficulty with self-care will improve Outcome: Progressing Goal: Ability to communicate needs accurately will improve Outcome: Progressing   Problem: Nutrition: Goal: Risk of aspiration will decrease Outcome: Progressing Goal: Dietary intake will improve Outcome: Progressing   Problem: Education: Goal: Ability to describe self-care measures that may prevent or decrease complications (Diabetes Survival Skills Education) will improve Outcome: Progressing Goal: Individualized  Educational Video(s) Outcome: Progressing   Problem: Coping: Goal: Ability to adjust to condition or change in health will improve Outcome: Progressing   Problem: Fluid Volume: Goal: Ability to maintain a balanced intake and output will improve Outcome: Progressing   Problem: Health Behavior/Discharge Planning: Goal: Ability to identify and utilize available resources and services will improve Outcome: Progressing Goal: Ability to manage health-related needs will improve Outcome: Progressing   Problem: Metabolic: Goal: Ability to maintain appropriate glucose levels will improve Outcome: Progressing   Problem: Nutritional: Goal: Maintenance of adequate nutrition will improve Outcome: Progressing Goal: Progress toward achieving an optimal weight will improve Outcome: Progressing   Problem: Skin Integrity: Goal: Risk for impaired skin integrity will decrease Outcome: Progressing   Problem: Tissue Perfusion: Goal: Adequacy of tissue perfusion will improve Outcome: Progressing   Problem: Education: Goal: Knowledge of General Education information will improve Description: Including pain rating scale, medication(s)/side effects and non-pharmacologic comfort measures Outcome: Progressing   Problem: Health Behavior/Discharge Planning: Goal: Ability to manage health-related needs will improve Outcome: Progressing   Problem: Clinical Measurements: Goal: Ability to maintain clinical measurements within normal limits will improve Outcome: Progressing Goal: Will remain free from infection Outcome: Progressing Goal: Diagnostic test results will improve Outcome: Progressing Goal: Respiratory complications will improve Outcome: Progressing Goal: Cardiovascular complication will be avoided Outcome: Progressing   Problem: Activity: Goal: Risk for activity intolerance will decrease Outcome: Progressing   Problem: Nutrition: Goal: Adequate nutrition will be  maintained Outcome: Progressing   Problem: Coping: Goal: Level of anxiety will decrease Outcome: Progressing   Problem: Elimination: Goal: Will not experience complications related to bowel motility Outcome: Progressing Goal: Will not experience complications related to urinary retention Outcome: Progressing   Problem: Pain Managment: Goal: General experience of comfort will improve and/or be controlled Outcome: Progressing   Problem: Safety: Goal: Ability to remain free from injury will improve Outcome: Progressing   Problem: Skin Integrity: Goal: Risk for impaired skin integrity will decrease Outcome: Progressing
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
## 2023-10-03 NOTE — Plan of Care (Signed)
  Problem: Education: Goal: Knowledge of disease or condition will improve Outcome: Progressing Goal: Knowledge of secondary prevention will improve (MUST DOCUMENT ALL) Outcome: Progressing Goal: Knowledge of patient specific risk factors will improve (DELETE if not current risk factor) Outcome: Progressing   Problem: Ischemic Stroke/TIA Tissue Perfusion: Goal: Complications of ischemic stroke/TIA will be minimized Outcome: Progressing   Problem: Self-Care: Goal: Ability to participate in self-care as condition permits will improve Outcome: Progressing Goal: Verbalization of feelings and concerns over difficulty with self-care will improve Outcome: Progressing Goal: Ability to communicate needs accurately will improve Outcome: Progressing   Problem: Metabolic: Goal: Ability to maintain appropriate glucose levels will improve Outcome: Progressing   Problem: Nutritional: Goal: Maintenance of adequate nutrition will improve Outcome: Progressing Goal: Progress toward achieving an optimal weight will improve Outcome: Progressing   Problem: Skin Integrity: Goal: Risk for impaired skin integrity will decrease Outcome: Progressing   Problem: Tissue Perfusion: Goal: Adequacy of tissue perfusion will improve Outcome: Progressing   Problem: Clinical Measurements: Goal: Ability to maintain clinical measurements within normal limits will improve Outcome: Progressing Goal: Will remain free from infection Outcome: Progressing Goal: Diagnostic test results will improve Outcome: Progressing Goal: Respiratory complications will improve Outcome: Progressing Goal: Cardiovascular complication will be avoided Outcome: Progressing   Problem: Activity: Goal: Risk for activity intolerance will decrease Outcome: Progressing   Problem: Coping: Goal: Level of anxiety will decrease Outcome: Progressing   Problem: Pain Managment: Goal: General experience of comfort will improve and/or be  controlled Outcome: Progressing   Problem: Safety: Goal: Ability to remain free from injury will improve Outcome: Progressing   Problem: Skin Integrity: Goal: Risk for impaired skin integrity will decrease Outcome: Progressing

## 2023-10-04 DIAGNOSIS — G459 Transient cerebral ischemic attack, unspecified: Secondary | ICD-10-CM | POA: Diagnosis not present

## 2023-10-04 DIAGNOSIS — Z7189 Other specified counseling: Secondary | ICD-10-CM

## 2023-10-04 LAB — CBC WITH DIFFERENTIAL/PLATELET
Abs Immature Granulocytes: 0.06 K/uL (ref 0.00–0.07)
Basophils Absolute: 0.1 K/uL (ref 0.0–0.1)
Basophils Relative: 1 %
Eosinophils Absolute: 0 K/uL (ref 0.0–0.5)
Eosinophils Relative: 0 %
HCT: 34.1 % — ABNORMAL LOW (ref 36.0–46.0)
Hemoglobin: 11.2 g/dL — ABNORMAL LOW (ref 12.0–15.0)
Immature Granulocytes: 0 %
Lymphocytes Relative: 23 %
Lymphs Abs: 3.1 K/uL (ref 0.7–4.0)
MCH: 28.6 pg (ref 26.0–34.0)
MCHC: 32.8 g/dL (ref 30.0–36.0)
MCV: 87 fL (ref 80.0–100.0)
Monocytes Absolute: 0.9 K/uL (ref 0.1–1.0)
Monocytes Relative: 7 %
Neutro Abs: 9.4 K/uL — ABNORMAL HIGH (ref 1.7–7.7)
Neutrophils Relative %: 69 %
Platelets: 314 K/uL (ref 150–400)
RBC: 3.92 MIL/uL (ref 3.87–5.11)
RDW: 15.3 % (ref 11.5–15.5)
WBC: 13.6 K/uL — ABNORMAL HIGH (ref 4.0–10.5)
nRBC: 0 % (ref 0.0–0.2)

## 2023-10-04 LAB — BASIC METABOLIC PANEL WITH GFR
Anion gap: 11 (ref 5–15)
BUN: 19 mg/dL (ref 8–23)
CO2: 23 mmol/L (ref 22–32)
Calcium: 10.7 mg/dL — ABNORMAL HIGH (ref 8.9–10.3)
Chloride: 109 mmol/L (ref 98–111)
Creatinine, Ser: 0.8 mg/dL (ref 0.44–1.00)
GFR, Estimated: >60 mL/min (ref >60)
Glucose, Bld: 117 mg/dL — ABNORMAL HIGH (ref 70–99)
Potassium: 3.7 mmol/L (ref 3.5–5.1)
Sodium: 143 mmol/L (ref 135–145)

## 2023-10-04 LAB — GLUCOSE, CAPILLARY
Glucose-Capillary: 100 mg/dL — ABNORMAL HIGH (ref 70–99)
Glucose-Capillary: 106 mg/dL — ABNORMAL HIGH (ref 70–99)
Glucose-Capillary: 113 mg/dL — ABNORMAL HIGH (ref 70–99)
Glucose-Capillary: 117 mg/dL — ABNORMAL HIGH (ref 70–99)
Glucose-Capillary: 121 mg/dL — ABNORMAL HIGH (ref 70–99)
Glucose-Capillary: 180 mg/dL — ABNORMAL HIGH (ref 70–99)

## 2023-10-04 MED ORDER — POLYVINYL ALCOHOL 1.4 % OP SOLN
2.0000 [drp] | Freq: Four times a day (QID) | OPHTHALMIC | Status: DC | PRN
  Filled 2023-10-04: qty 15

## 2023-10-04 MED ORDER — ROSUVASTATIN CALCIUM 10 MG PO TABS
20.0000 mg | ORAL_TABLET | Freq: Every day | ORAL | Status: DC
  Administered 2023-10-04 – 2023-10-06 (×3): 20 mg via ORAL
  Filled 2023-10-04 (×3): qty 2

## 2023-10-04 MED ORDER — DEXTROSE-SODIUM CHLORIDE 5-0.9 % IV SOLN: INTRAVENOUS | Status: AC

## 2023-10-04 MED ORDER — POLYVINYL ALCOHOL 1.4 % OP SOLN: 2.0000 [drp] | OPHTHALMIC | Status: DC | PRN
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
## 2023-10-04 NOTE — Plan of Care (Signed)
  Problem: Education: Goal: Knowledge of disease or condition will improve Outcome: Progressing Goal: Knowledge of secondary prevention will improve (MUST DOCUMENT ALL) Outcome: Progressing Goal: Knowledge of patient specific risk factors will improve (DELETE if not current risk factor) Outcome: Progressing   Problem: Ischemic Stroke/TIA Tissue Perfusion: Goal: Complications of ischemic stroke/TIA will be minimized Outcome: Progressing   Problem: Health Behavior/Discharge Planning: Goal: Ability to manage health-related needs will improve Outcome: Progressing Goal: Goals will be collaboratively established with patient/family Outcome: Progressing   Problem: Self-Care: Goal: Ability to participate in self-care as condition permits will improve Outcome: Progressing Goal: Verbalization of feelings and concerns over difficulty with self-care will improve Outcome: Progressing Goal: Ability to communicate needs accurately will improve Outcome: Progressing   Problem: Nutrition: Goal: Risk of aspiration will decrease Outcome: Progressing Goal: Dietary intake will improve Outcome: Progressing   Problem: Education: Goal: Ability to describe self-care measures that may prevent or decrease complications (Diabetes Survival Skills Education) will improve Outcome: Progressing Goal: Individualized Educational Video(s) Outcome: Progressing   Problem: Fluid Volume: Goal: Ability to maintain a balanced intake and output will improve Outcome: Progressing   Problem: Metabolic: Goal: Ability to maintain appropriate glucose levels will improve Outcome: Progressing   Problem: Skin Integrity: Goal: Risk for impaired skin integrity will decrease Outcome: Progressing   Problem: Tissue Perfusion: Goal: Adequacy of tissue perfusion will improve Outcome: Progressing   Problem: Pain Managment: Goal: General experience of comfort will improve and/or be controlled Outcome: Progressing    Problem: Safety: Goal: Ability to remain free from injury will improve Outcome: Progressing   Problem: Skin Integrity: Goal: Risk for impaired skin integrity will decrease Outcome: Progressing

## 2023-10-04 NOTE — Plan of Care (Signed)
  Problem: Education: Goal: Knowledge of disease or condition will improve Outcome: Progressing Goal: Knowledge of secondary prevention will improve (MUST DOCUMENT ALL) Outcome: Progressing Goal: Knowledge of patient specific risk factors will improve (DELETE if not current risk factor) Outcome: Progressing   Problem: Ischemic Stroke/TIA Tissue Perfusion: Goal: Complications of ischemic stroke/TIA will be minimized Outcome: Progressing   Problem: Coping: Goal: Will verbalize positive feelings about self Outcome: Progressing Goal: Will identify appropriate support needs Outcome: Progressing   Problem: Health Behavior/Discharge Planning: Goal: Ability to manage health-related needs will improve Outcome: Progressing Goal: Goals will be collaboratively established with patient/family Outcome: Progressing   Problem: Self-Care: Goal: Ability to participate in self-care as condition permits will improve Outcome: Progressing Goal: Verbalization of feelings and concerns over difficulty with self-care will improve Outcome: Progressing Goal: Ability to communicate needs accurately will improve Outcome: Progressing   Problem: Nutrition: Goal: Risk of aspiration will decrease Outcome: Progressing   Problem: Education: Goal: Ability to describe self-care measures that may prevent or decrease complications (Diabetes Survival Skills Education) will improve Outcome: Progressing Goal: Individualized Educational Video(s) Outcome: Progressing   Problem: Coping: Goal: Ability to adjust to condition or change in health will improve Outcome: Progressing   Problem: Health Behavior/Discharge Planning: Goal: Ability to identify and utilize available resources and services will improve Outcome: Progressing Goal: Ability to manage health-related needs will improve Outcome: Progressing   Problem: Metabolic: Goal: Ability to maintain appropriate glucose levels will improve Outcome:  Progressing   Problem: Skin Integrity: Goal: Risk for impaired skin integrity will decrease Outcome: Progressing   Problem: Tissue Perfusion: Goal: Adequacy of tissue perfusion will improve Outcome: Progressing   Problem: Education: Goal: Knowledge of General Education information will improve Description: Including pain rating scale, medication(s)/side effects and non-pharmacologic comfort measures Outcome: Progressing   Problem: Health Behavior/Discharge Planning: Goal: Ability to manage health-related needs will improve Outcome: Progressing   Problem: Clinical Measurements: Goal: Ability to maintain clinical measurements within normal limits will improve Outcome: Progressing Goal: Will remain free from infection Outcome: Progressing Goal: Diagnostic test results will improve Outcome: Progressing Goal: Respiratory complications will improve Outcome: Progressing Goal: Cardiovascular complication will be avoided Outcome: Progressing   Problem: Activity: Goal: Risk for activity intolerance will decrease Outcome: Progressing   Problem: Coping: Goal: Level of anxiety will decrease Outcome: Progressing   Problem: Elimination: Goal: Will not experience complications related to bowel motility Outcome: Progressing Goal: Will not experience complications related to urinary retention Outcome: Progressing   Problem: Pain Managment: Goal: General experience of comfort will improve and/or be controlled Outcome: Progressing   Problem: Safety: Goal: Ability to remain free from injury will improve Outcome: Progressing   Problem: Skin Integrity: Goal: Risk for impaired skin integrity will decrease Outcome: Progressing   Problem: Nutrition: Goal: Dietary intake will improve Outcome: Not Progressing   Problem: Fluid Volume: Goal: Ability to maintain a balanced intake and output will improve Outcome: Not Progressing   Problem: Nutritional: Goal: Maintenance of adequate  nutrition will improve Outcome: Not Progressing Goal: Progress toward achieving an optimal weight will improve Outcome: Not Progressing   Problem: Nutrition: Goal: Adequate nutrition will be maintained Outcome: Not Progressing
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
## 2023-10-05 DIAGNOSIS — Z7189 Other specified counseling: Secondary | ICD-10-CM | POA: Diagnosis not present

## 2023-10-05 DIAGNOSIS — G459 Transient cerebral ischemic attack, unspecified: Secondary | ICD-10-CM | POA: Diagnosis not present

## 2023-10-05 LAB — BASIC METABOLIC PANEL WITH GFR
Anion gap: 10 (ref 5–15)
BUN: 20 mg/dL (ref 8–23)
CO2: 24 mmol/L (ref 22–32)
Calcium: 10.3 mg/dL (ref 8.9–10.3)
Chloride: 112 mmol/L — ABNORMAL HIGH (ref 98–111)
Creatinine, Ser: 0.9 mg/dL (ref 0.44–1.00)
GFR, Estimated: >60 mL/min (ref >60)
Glucose, Bld: 117 mg/dL — ABNORMAL HIGH (ref 70–99)
Potassium: 3.2 mmol/L — ABNORMAL LOW (ref 3.5–5.1)
Sodium: 146 mmol/L — ABNORMAL HIGH (ref 135–145)

## 2023-10-05 LAB — CBC WITH DIFFERENTIAL/PLATELET
Abs Immature Granulocytes: 0.03 K/uL (ref 0.00–0.07)
Basophils Absolute: 0.1 K/uL (ref 0.0–0.1)
Basophils Relative: 1 %
Eosinophils Absolute: 0 K/uL (ref 0.0–0.5)
Eosinophils Relative: 0 %
HCT: 34.9 % — ABNORMAL LOW (ref 36.0–46.0)
Hemoglobin: 11.4 g/dL — ABNORMAL LOW (ref 12.0–15.0)
Immature Granulocytes: 0 %
Lymphocytes Relative: 23 %
Lymphs Abs: 2.2 K/uL (ref 0.7–4.0)
MCH: 28.6 pg (ref 26.0–34.0)
MCHC: 32.7 g/dL (ref 30.0–36.0)
MCV: 87.5 fL (ref 80.0–100.0)
Monocytes Absolute: 0.7 K/uL (ref 0.1–1.0)
Monocytes Relative: 7 %
Neutro Abs: 6.5 K/uL (ref 1.7–7.7)
Neutrophils Relative %: 69 %
Platelets: 304 K/uL (ref 150–400)
RBC: 3.99 MIL/uL (ref 3.87–5.11)
RDW: 15.2 % (ref 11.5–15.5)
WBC: 9.5 K/uL (ref 4.0–10.5)
nRBC: 0 % (ref 0.0–0.2)

## 2023-10-05 LAB — GLUCOSE, CAPILLARY
Glucose-Capillary: 109 mg/dL — ABNORMAL HIGH (ref 70–99)
Glucose-Capillary: 116 mg/dL — ABNORMAL HIGH (ref 70–99)
Glucose-Capillary: 128 mg/dL — ABNORMAL HIGH (ref 70–99)
Glucose-Capillary: 134 mg/dL — ABNORMAL HIGH (ref 70–99)
Glucose-Capillary: 152 mg/dL — ABNORMAL HIGH (ref 70–99)
Glucose-Capillary: 156 mg/dL — ABNORMAL HIGH (ref 70–99)
Glucose-Capillary: 215 mg/dL — ABNORMAL HIGH (ref 70–99)
Glucose-Capillary: 95 mg/dL (ref 70–99)
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
## 2023-10-05 NOTE — Plan of Care (Signed)
  Problem: Education: Goal: Knowledge of disease or condition will improve Outcome: Not Progressing Goal: Knowledge of secondary prevention will improve (MUST DOCUMENT ALL) Outcome: Not Progressing   Problem: Ischemic Stroke/TIA Tissue Perfusion: Goal: Complications of ischemic stroke/TIA will be minimized Outcome: Not Progressing   Problem: Coping: Goal: Will verbalize positive feelings about self Outcome: Not Progressing

## 2023-10-05 NOTE — Care Management Important Message (Signed)
 Important Message  Patient Details  Name: Rita Taylor MRN: [id number] Date of Birth: 28-May-1940   Important Message Given:  Yes - Medicare IM     Rojelio SHAUNNA Rattler 10/05/2023, 12:36 PM
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
## 2023-10-05 NOTE — Plan of Care (Signed)
  Problem: Education: Goal: Knowledge of disease or condition will improve Outcome: Progressing Goal: Knowledge of secondary prevention will improve (MUST DOCUMENT ALL) Outcome: Progressing Goal: Knowledge of patient specific risk factors will improve (DELETE if not current risk factor) Outcome: Progressing   Problem: Ischemic Stroke/TIA Tissue Perfusion: Goal: Complications of ischemic stroke/TIA will be minimized Outcome: Progressing   Problem: Coping: Goal: Will verbalize positive feelings about self Outcome: Progressing Goal: Will identify appropriate support needs Outcome: Progressing   Problem: Health Behavior/Discharge Planning: Goal: Ability to manage health-related needs will improve Outcome: Progressing Goal: Goals will be collaboratively established with patient/family Outcome: Progressing   Problem: Self-Care: Goal: Ability to participate in self-care as condition permits will improve Outcome: Progressing Goal: Verbalization of feelings and concerns over difficulty with self-care will improve Outcome: Progressing Goal: Ability to communicate needs accurately will improve Outcome: Progressing   Problem: Nutrition: Goal: Risk of aspiration will decrease Outcome: Progressing Goal: Dietary intake will improve Outcome: Progressing   Problem: Education: Goal: Ability to describe self-care measures that may prevent or decrease complications (Diabetes Survival Skills Education) will improve Outcome: Progressing Goal: Individualized Educational Video(s) Outcome: Progressing   Problem: Coping: Goal: Ability to adjust to condition or change in health will improve Outcome: Progressing   Problem: Metabolic: Goal: Ability to maintain appropriate glucose levels will improve Outcome: Progressing   Problem: Skin Integrity: Goal: Risk for impaired skin integrity will decrease Outcome: Progressing   Problem: Tissue Perfusion: Goal: Adequacy of tissue perfusion will  improve Outcome: Progressing   Problem: Education: Goal: Knowledge of General Education information will improve Description: Including pain rating scale, medication(s)/side effects and non-pharmacologic comfort measures Outcome: Progressing   Problem: Activity: Goal: Risk for activity intolerance will decrease Outcome: Progressing   Problem: Pain Managment: Goal: General experience of comfort will improve and/or be controlled Outcome: Progressing   Problem: Safety: Goal: Ability to remain free from injury will improve Outcome: Progressing   Problem: Skin Integrity: Goal: Risk for impaired skin integrity will decrease Outcome: Progressing
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
## 2023-10-06 DIAGNOSIS — Z7189 Other specified counseling: Secondary | ICD-10-CM | POA: Diagnosis not present

## 2023-10-06 DIAGNOSIS — G459 Transient cerebral ischemic attack, unspecified: Secondary | ICD-10-CM | POA: Diagnosis not present

## 2023-10-06 LAB — GLUCOSE, CAPILLARY
Glucose-Capillary: 113 mg/dL — ABNORMAL HIGH (ref 70–99)
Glucose-Capillary: 120 mg/dL — ABNORMAL HIGH (ref 70–99)
Glucose-Capillary: 137 mg/dL — ABNORMAL HIGH (ref 70–99)
Glucose-Capillary: 143 mg/dL — ABNORMAL HIGH (ref 70–99)
Glucose-Capillary: 158 mg/dL — ABNORMAL HIGH (ref 70–99)
Glucose-Capillary: 92 mg/dL (ref 70–99)
Glucose-Capillary: 99 mg/dL (ref 70–99)
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
## 2023-10-06 NOTE — Plan of Care (Signed)
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
## 2023-10-07 DIAGNOSIS — Z789 Other specified health status: Secondary | ICD-10-CM | POA: Diagnosis not present

## 2023-10-07 DIAGNOSIS — Z7189 Other specified counseling: Secondary | ICD-10-CM | POA: Diagnosis not present

## 2023-10-07 DIAGNOSIS — G459 Transient cerebral ischemic attack, unspecified: Secondary | ICD-10-CM | POA: Diagnosis not present

## 2023-10-07 LAB — GLUCOSE, CAPILLARY
Glucose-Capillary: 95 mg/dL (ref 70–99)
Glucose-Capillary: 122 mg/dL — ABNORMAL HIGH (ref 70–99)
Glucose-Capillary: 100 mg/dL — ABNORMAL HIGH (ref 70–99)

## 2023-10-07 MED ORDER — LORAZEPAM 2 MG/ML PO CONC: 1.0000 mg | ORAL | Status: DC | PRN

## 2023-10-07 MED ORDER — HALOPERIDOL 0.5 MG PO TABS: 0.5000 mg | ORAL_TABLET | ORAL | Status: DC | PRN

## 2023-10-07 MED ORDER — POLYVINYL ALCOHOL 1.4 % OP SOLN: 1.0000 [drp] | Freq: Four times a day (QID) | OPHTHALMIC | Status: DC | PRN

## 2023-10-07 MED ORDER — MIRTAZAPINE 7.5 MG PO TABS: 7.5000 mg | ORAL_TABLET | Freq: Every day | ORAL | Status: DC

## 2023-10-07 MED ORDER — POLYETHYLENE GLYCOL 3350 17 G PO PACK: 17.0000 g | PACK | Freq: Every day | ORAL | Status: DC | PRN

## 2023-10-07 MED ORDER — LORAZEPAM 1 MG PO TABS: 1.0000 mg | ORAL_TABLET | ORAL | Status: DC | PRN

## 2023-10-07 MED ORDER — FAMOTIDINE 20 MG PO TABS: 20.0000 mg | ORAL_TABLET | Freq: Every day | ORAL | Status: DC

## 2023-10-07 MED ORDER — BIOTENE DRY MOUTH MT LIQD: 15.0000 mL | OROMUCOSAL | Status: DC | PRN

## 2023-10-07 MED ORDER — HALOPERIDOL LACTATE 2 MG/ML PO CONC: 0.5000 mg | ORAL | Status: DC | PRN

## 2023-10-07 MED ORDER — MORPHINE SULFATE (CONCENTRATE) 10 MG /0.5 ML PO SOLN: 5.0000 mg | ORAL | Status: DC | PRN

## 2023-10-07 MED ORDER — GLYCOPYRROLATE 1 MG PO TABS: 1.0000 mg | ORAL_TABLET | ORAL | Status: DC | PRN

## 2023-10-07 MED ORDER — ONDANSETRON 4 MG PO TBDP: 4.0000 mg | ORAL_TABLET | Freq: Four times a day (QID) | ORAL | Status: DC | PRN

## 2023-10-07 MED ORDER — ENSURE ENLIVE PO LIQD: 237.0000 mL | Freq: Two times a day (BID) | ORAL | Status: DC

## 2023-10-07 MED ORDER — ONDANSETRON HCL 4 MG/2ML IJ SOLN: 4.0000 mg | Freq: Four times a day (QID) | INTRAMUSCULAR | Status: DC | PRN

## 2023-10-07 MED ORDER — ACETAMINOPHEN 325 MG PO TABS: 650.0000 mg | ORAL_TABLET | ORAL | Status: DC | PRN

## 2023-10-07 MED ORDER — MORPHINE SULFATE (CONCENTRATE) 10 MG /0.5 ML PO SOLN: 5.0000 mg | ORAL | 0 refills | Status: DC | PRN

## 2023-10-07 MED ORDER — SENNOSIDES-DOCUSATE SODIUM 8.6-50 MG PO TABS: 1.0000 | ORAL_TABLET | Freq: Every evening | ORAL | Status: DC | PRN

## 2023-10-07 MED ORDER — HALOPERIDOL LACTATE 5 MG/ML IJ SOLN: 0.5000 mg | INTRAMUSCULAR | Status: DC | PRN

## 2023-10-07 MED ORDER — LORAZEPAM 1 MG PO TABS: 1.0000 mg | ORAL_TABLET | ORAL | 0 refills | Status: DC | PRN

## 2023-10-07 MED ORDER — LIDOCAINE 5 % EX PTCH: 2.0000 | MEDICATED_PATCH | CUTANEOUS | Status: DC

## 2023-10-07 MED ORDER — DOCUSATE SODIUM 100 MG PO CAPS: 100.0000 mg | ORAL_CAPSULE | Freq: Two times a day (BID) | ORAL | Status: DC

## 2023-10-07 MED ORDER — CARVEDILOL 25 MG PO TABS: 25.0000 mg | ORAL_TABLET | Freq: Two times a day (BID) | ORAL | Status: DC

## 2023-10-07 MED ORDER — METHOCARBAMOL 500 MG PO TABS: 500.0000 mg | ORAL_TABLET | Freq: Three times a day (TID) | ORAL | Status: DC | PRN

## 2023-10-07 MED ORDER — MELATONIN 5 MG PO TABS: 5.0000 mg | ORAL_TABLET | Freq: Every day | ORAL | Status: DC
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
## 2023-10-07 NOTE — TOC Progression Note (Signed)
 Transition of Care [facility]) - Progression Note    Patient Details  Name: Rita Taylor MRN: [id number] Date of Birth: 1940/06/22  Transition [facility]) CM/SW Contact  Racheal LITTIE Schimke, RN Phone Number: 10/07/2023, 9:24 AM  Clinical Narrative: Spoke with Ed at [facility], he consents to patient returning with Hospice. Spoke with Son, Georgette who receptive to [facility]. [facility] representative [place] notified.    Expected Discharge Plan: Assisted Living (with home health) Barriers to Discharge: Continued Medical Work up               Expected Discharge Plan and Services       Living arrangements for the past 2 months: Assisted Living Facility                                       Social Drivers of Health (SDOH) Interventions SDOH Screenings   Food Insecurity: No Food Insecurity (04/27/2023)  Housing: Unknown (09/08/2023)   Received from [facility]  Transportation Needs: No Transportation Needs (04/27/2023)  Utilities: Not At Risk (04/27/2023)  Depression (PHQ2-9): Low Risk  (11/29/2022)  Financial Resource Strain: Low Risk  (06/03/2022)  Physical Activity: Unknown (06/03/2022)  Social Connections: Moderately Isolated (04/27/2023)  Stress: No Stress Concern Present (06/03/2022)  Tobacco Use: Low Risk  (09/30/2023)    Readmission Risk Interventions    10/05/2023    9:59 AM  Readmission Risk Prevention Plan  PCP or Specialist appointment within 3-5 days of discharge Complete  HRI or Home Care Consult Complete  SW Recovery Care/Counseling Consult Complete  Palliative Care Screening Complete  Skilled Nursing Facility Not Applicable

## 2023-10-07 NOTE — Plan of Care (Signed)

## 2023-10-07 NOTE — Progress Notes (Addendum)
 Report called to RN at [facility] Full report provided with all questions and concerns answered at this time. This RN relayed that pt would be transported via [facility], tentatively for this afternoon. Understanding verbalized.  Pt discharged to [facility] with Medical Transport team. Report provided to transport team along with AVS documentation. All pt belongings taken with pt. Telemetry disconnected and notified.

## 2023-10-07 NOTE — Plan of Care (Signed)
 Problem: Education: Goal: Knowledge of disease or condition will improve Outcome: Adequate for Discharge Goal: Knowledge of secondary prevention will improve (MUST DOCUMENT ALL) Outcome: Adequate for Discharge Goal: Knowledge of patient specific risk factors will improve (DELETE if not current risk factor) Outcome: Adequate for Discharge   Problem: Ischemic Stroke/TIA Tissue Perfusion: Goal: Complications of ischemic stroke/TIA will be minimized Outcome: Adequate for Discharge   Problem: Coping: Goal: Will verbalize positive feelings about self Outcome: Adequate for Discharge Goal: Will identify appropriate support needs Outcome: Adequate for Discharge   Problem: Health Behavior/Discharge Planning: Goal: Ability to manage health-related needs will improve Outcome: Adequate for Discharge Goal: Goals will be collaboratively established with patient/family Outcome: Adequate for Discharge   Problem: Self-Care: Goal: Ability to participate in self-care as condition permits will improve Outcome: Adequate for Discharge Goal: Verbalization of feelings and concerns over difficulty with self-care will improve Outcome: Adequate for Discharge Goal: Ability to communicate needs accurately will improve Outcome: Adequate for Discharge   Problem: Nutrition: Goal: Risk of aspiration will decrease Outcome: Adequate for Discharge Goal: Dietary intake will improve Outcome: Adequate for Discharge   Problem: Education: Goal: Ability to describe self-care measures that may prevent or decrease complications (Diabetes Survival Skills Education) will improve Outcome: Adequate for Discharge Goal: Individualized Educational Video(s) Outcome: Adequate for Discharge   Problem: Coping: Goal: Ability to adjust to condition or change in health will improve Outcome: Adequate for Discharge   Problem: Fluid Volume: Goal: Ability to maintain a balanced intake and output will improve Outcome: Adequate  for Discharge   Problem: Health Behavior/Discharge Planning: Goal: Ability to identify and utilize available resources and services will improve Outcome: Adequate for Discharge Goal: Ability to manage health-related needs will improve Outcome: Adequate for Discharge   Problem: Metabolic: Goal: Ability to maintain appropriate glucose levels will improve Outcome: Adequate for Discharge   Problem: Nutritional: Goal: Maintenance of adequate nutrition will improve Outcome: Adequate for Discharge Goal: Progress toward achieving an optimal weight will improve Outcome: Adequate for Discharge   Problem: Skin Integrity: Goal: Risk for impaired skin integrity will decrease Outcome: Adequate for Discharge   Problem: Tissue Perfusion: Goal: Adequacy of tissue perfusion will improve Outcome: Adequate for Discharge   Problem: SLP Dysphagia Goals Goal: Patient will demonstrate readiness for PO's Description: Patient will demonstrate readiness for PO's and/or instrumental swallow study as evidenced by: Outcome: Adequate for Discharge   Problem: Acute Rehab OT Goals (only OT should resolve) Goal: Pt. Will Perform Eating Outcome: Adequate for Discharge Goal: Pt. Will Perform Grooming Outcome: Adequate for Discharge   Problem: Education: Goal: Knowledge of General Education information will improve Description: Including pain rating scale, medication(s)/side effects and non-pharmacologic comfort measures Outcome: Adequate for Discharge   Problem: Health Behavior/Discharge Planning: Goal: Ability to manage health-related needs will improve Outcome: Adequate for Discharge   Problem: Clinical Measurements: Goal: Ability to maintain clinical measurements within normal limits will improve Outcome: Adequate for Discharge Goal: Will remain free from infection Outcome: Adequate for Discharge Goal: Diagnostic test results will improve Outcome: Adequate for Discharge Goal: Respiratory  complications will improve Outcome: Adequate for Discharge Goal: Cardiovascular complication will be avoided Outcome: Adequate for Discharge   Problem: Activity: Goal: Risk for activity intolerance will decrease Outcome: Adequate for Discharge   Problem: Nutrition: Goal: Adequate nutrition will be maintained Outcome: Adequate for Discharge   Problem: Coping: Goal: Level of anxiety will decrease Outcome: Adequate for Discharge   Problem: Elimination: Goal: Will not experience complications related to bowel motility Outcome:  Adequate for Discharge Goal: Will not experience complications related to urinary retention Outcome: Adequate for Discharge   Problem: Pain Managment: Goal: General experience of comfort will improve and/or be controlled Outcome: Adequate for Discharge   Problem: Safety: Goal: Ability to remain free from injury will improve Outcome: Adequate for Discharge   Problem: Skin Integrity: Goal: Risk for impaired skin integrity will decrease Outcome: Adequate for Discharge   Problem: Acute Rehab PT Goals(only PT should resolve) Goal: Pt Will Ambulate Outcome: Adequate for Discharge Goal: Pt/caregiver will Perform Home Exercise Program Outcome: Adequate for Discharge   Problem: Education: Goal: Knowledge of the prescribed therapeutic regimen will improve Outcome: Adequate for Discharge   Problem: Coping: Goal: Ability to identify and develop effective coping behavior will improve Outcome: Adequate for Discharge   Problem: Clinical Measurements: Goal: Quality of life will improve Outcome: Adequate for Discharge   Problem: Respiratory: Goal: Verbalizations of increased ease of respirations will increase Outcome: Adequate for Discharge   Problem: Role Relationship: Goal: Family's ability to cope with current situation will improve Outcome: Adequate for Discharge Goal: Ability to verbalize concerns, feelings, and thoughts to partner or family member  will improve Outcome: Adequate for Discharge   Problem: Pain Management: Goal: Satisfaction with pain management regimen will improve Outcome: Adequate for Discharge

## 2023-10-07 NOTE — Progress Notes (Signed)
 Nutrition Brief Note  Chart reviewed. Pt now transitioning to comfort care.  No further nutrition interventions planned at this time.  Please re-consult as needed.   Margery ORN, RD, LDN, CDCES Registered Dietitian III Certified Diabetes Care and Education Specialist If unable to reach this RD, please use RD Inpatient group chat on secure chat between hours of 8am-4 pm daily

## 2023-10-07 NOTE — TOC Transition Note (Signed)
 Transition of Care [facility]) - Discharge Note   Patient Details  Name: Rita Taylor MRN: [id number] Date of Birth: 1940-05-21  Transition of Care [facility]) CM/SW Contact:  Racheal LITTIE Schimke, RN Phone Number: 10/07/2023, 12:44 PM   Clinical Narrative: Discharge via [facility]. Son, [place] notified. [facility] to follow at ALF, [facility].     Final next level of care: Assisted Living (ALF with [facility]) Barriers to Discharge: Barriers Resolved   Patient Goals and CMS Choice     Choice offered to / list presented to : Adult Children      Discharge Placement                  Name of family member notified: Georgette, Son Patient and family notified of of transfer: 10/07/23  Discharge Plan and Services Additional resources added to the After Visit Summary for                  DME Arranged: Hospice Equipment Package B DME Agency: [facility] (Physicist, medical)                  Social Drivers of Health (SDOH) Interventions SDOH Screenings   Food Insecurity: No Food Insecurity (04/27/2023)  Housing: Unknown (09/08/2023)   Received from [facility]  Transportation Needs: No Transportation Needs (04/27/2023)  Utilities: Not At Risk (04/27/2023)  Depression (PHQ2-9): Low Risk  (11/29/2022)  Financial Resource Strain: Low Risk  (06/03/2022)  Physical Activity: Unknown (06/03/2022)  Social Connections: Moderately Isolated (04/27/2023)  Stress: No Stress Concern Present (06/03/2022)  Tobacco Use: Low Risk  (09/30/2023)     Readmission Risk Interventions    10/05/2023    9:59 AM  Readmission Risk Prevention Plan  PCP or Specialist appointment within 3-5 days of discharge Complete  HRI or Home Care Consult Complete  SW Recovery Care/Counseling Consult Complete  Palliative Care Screening Complete  Skilled Nursing Facility Not Applicable
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
## 2023-10-07 NOTE — Progress Notes (Signed)
 [facility] LIAISON NOTE   Received request from [name], SW Transitions of Care (TOC) at [facility], for hospice services at [facility]F at discharge from [facility].  Spoke with [name], son, to initiate education related to hospice philosophy, services, and team approach to care. Patient/family verbalized understanding of information given.   DME needs discussed.  Patient has the following equipment in the home:  wheelchair  Patient/family requests the following equipment for delivery:   Hospice admission visit nurse will assess.  No urgent DME needs that will hold up hospital discharge.            The address has been verified and is correct in the chart.  [name], patient's son, the family contact to arrange time of equipment delivery.   Please send signed and completed DNR home with patient/family if applicable.   Please provide prescriptions at discharge as needed to ensure ongoing symptom management.   [facility] information and contact numbers given to [place].  Above information shared with [name] and hospital medical care team.   Please call with any hospice related questions or concerns.  Thank you for the opportunity to participate in this patient's care.   [name], MA, BSN, RN, FNE Nurse Liaison [phone]

## 2023-10-11 DIAGNOSIS — R634 Abnormal weight loss: Secondary | ICD-10-CM | POA: Diagnosis not present

## 2023-10-11 DIAGNOSIS — I4892 Unspecified atrial flutter: Secondary | ICD-10-CM | POA: Diagnosis not present

## 2023-10-11 DIAGNOSIS — I679 Cerebrovascular disease, unspecified: Secondary | ICD-10-CM | POA: Diagnosis not present

## 2023-10-11 DIAGNOSIS — Q8502 Neurofibromatosis, type 2: Secondary | ICD-10-CM | POA: Diagnosis not present

## 2023-10-11 DIAGNOSIS — Z8673 Personal history of transient ischemic attack (TIA), and cerebral infarction without residual deficits: Secondary | ICD-10-CM | POA: Diagnosis not present

## 2023-10-11 DIAGNOSIS — F0150 Vascular dementia without behavioral disturbance: Secondary | ICD-10-CM | POA: Diagnosis not present

## 2023-10-11 DIAGNOSIS — I10 Essential (primary) hypertension: Secondary | ICD-10-CM | POA: Diagnosis not present

## 2023-10-11 DIAGNOSIS — G913 Post-traumatic hydrocephalus, unspecified: Secondary | ICD-10-CM | POA: Diagnosis not present

## 2023-10-11 DIAGNOSIS — F32A Depression, unspecified: Secondary | ICD-10-CM | POA: Diagnosis not present

## 2023-10-11 DIAGNOSIS — E43 Unspecified severe protein-calorie malnutrition: Secondary | ICD-10-CM | POA: Diagnosis not present

## 2023-10-11 DIAGNOSIS — E118 Type 2 diabetes mellitus with unspecified complications: Secondary | ICD-10-CM | POA: Diagnosis not present

## 2023-10-11 DIAGNOSIS — E785 Hyperlipidemia, unspecified: Secondary | ICD-10-CM | POA: Diagnosis not present

## 2023-10-12 DIAGNOSIS — E118 Type 2 diabetes mellitus with unspecified complications: Secondary | ICD-10-CM | POA: Diagnosis not present

## 2023-10-12 DIAGNOSIS — F0150 Vascular dementia without behavioral disturbance: Secondary | ICD-10-CM | POA: Diagnosis not present

## 2023-10-12 DIAGNOSIS — Z8673 Personal history of transient ischemic attack (TIA), and cerebral infarction without residual deficits: Secondary | ICD-10-CM | POA: Diagnosis not present

## 2023-10-12 DIAGNOSIS — I10 Essential (primary) hypertension: Secondary | ICD-10-CM | POA: Diagnosis not present

## 2023-10-12 DIAGNOSIS — I679 Cerebrovascular disease, unspecified: Secondary | ICD-10-CM | POA: Diagnosis not present

## 2023-10-12 DIAGNOSIS — E785 Hyperlipidemia, unspecified: Secondary | ICD-10-CM | POA: Diagnosis not present

## 2023-10-13 DIAGNOSIS — F0150 Vascular dementia without behavioral disturbance: Secondary | ICD-10-CM | POA: Diagnosis not present

## 2023-10-13 DIAGNOSIS — Z8673 Personal history of transient ischemic attack (TIA), and cerebral infarction without residual deficits: Secondary | ICD-10-CM | POA: Diagnosis not present

## 2023-10-13 DIAGNOSIS — R627 Adult failure to thrive: Secondary | ICD-10-CM | POA: Diagnosis not present

## 2023-10-13 DIAGNOSIS — I2510 Atherosclerotic heart disease of native coronary artery without angina pectoris: Secondary | ICD-10-CM | POA: Diagnosis not present

## 2023-10-13 DIAGNOSIS — E785 Hyperlipidemia, unspecified: Secondary | ICD-10-CM | POA: Diagnosis not present

## 2023-10-13 DIAGNOSIS — G9340 Encephalopathy, unspecified: Secondary | ICD-10-CM | POA: Diagnosis not present

## 2023-10-13 DIAGNOSIS — I10 Essential (primary) hypertension: Secondary | ICD-10-CM | POA: Diagnosis not present

## 2023-10-13 DIAGNOSIS — E118 Type 2 diabetes mellitus with unspecified complications: Secondary | ICD-10-CM | POA: Diagnosis not present

## 2023-10-13 DIAGNOSIS — I679 Cerebrovascular disease, unspecified: Secondary | ICD-10-CM | POA: Diagnosis not present

## 2023-10-13 DIAGNOSIS — G8929 Other chronic pain: Secondary | ICD-10-CM | POA: Diagnosis not present

## 2023-10-14 DIAGNOSIS — F0150 Vascular dementia without behavioral disturbance: Secondary | ICD-10-CM | POA: Diagnosis not present

## 2023-10-14 DIAGNOSIS — Z8673 Personal history of transient ischemic attack (TIA), and cerebral infarction without residual deficits: Secondary | ICD-10-CM | POA: Diagnosis not present

## 2023-10-14 DIAGNOSIS — I679 Cerebrovascular disease, unspecified: Secondary | ICD-10-CM | POA: Diagnosis not present

## 2023-10-14 DIAGNOSIS — E118 Type 2 diabetes mellitus with unspecified complications: Secondary | ICD-10-CM | POA: Diagnosis not present

## 2023-10-14 DIAGNOSIS — E785 Hyperlipidemia, unspecified: Secondary | ICD-10-CM | POA: Diagnosis not present

## 2023-10-14 DIAGNOSIS — I10 Essential (primary) hypertension: Secondary | ICD-10-CM | POA: Diagnosis not present

## 2023-10-17 DIAGNOSIS — Z8673 Personal history of transient ischemic attack (TIA), and cerebral infarction without residual deficits: Secondary | ICD-10-CM | POA: Diagnosis not present

## 2023-10-17 DIAGNOSIS — E118 Type 2 diabetes mellitus with unspecified complications: Secondary | ICD-10-CM | POA: Diagnosis not present

## 2023-10-17 DIAGNOSIS — E43 Unspecified severe protein-calorie malnutrition: Secondary | ICD-10-CM | POA: Diagnosis not present

## 2023-10-17 DIAGNOSIS — I679 Cerebrovascular disease, unspecified: Secondary | ICD-10-CM | POA: Diagnosis not present

## 2023-10-17 DIAGNOSIS — G913 Post-traumatic hydrocephalus, unspecified: Secondary | ICD-10-CM | POA: Diagnosis not present

## 2023-10-17 DIAGNOSIS — F0150 Vascular dementia without behavioral disturbance: Secondary | ICD-10-CM | POA: Diagnosis not present

## 2023-10-17 DIAGNOSIS — E785 Hyperlipidemia, unspecified: Secondary | ICD-10-CM | POA: Diagnosis not present

## 2023-10-17 DIAGNOSIS — Q8502 Neurofibromatosis, type 2: Secondary | ICD-10-CM | POA: Diagnosis not present

## 2023-10-17 DIAGNOSIS — I10 Essential (primary) hypertension: Secondary | ICD-10-CM | POA: Diagnosis not present

## 2023-10-17 DIAGNOSIS — I4892 Unspecified atrial flutter: Secondary | ICD-10-CM | POA: Diagnosis not present

## 2023-10-17 DIAGNOSIS — F32A Depression, unspecified: Secondary | ICD-10-CM | POA: Diagnosis not present

## 2023-10-17 DIAGNOSIS — R634 Abnormal weight loss: Secondary | ICD-10-CM | POA: Diagnosis not present

## 2023-10-18 DIAGNOSIS — E785 Hyperlipidemia, unspecified: Secondary | ICD-10-CM | POA: Diagnosis not present

## 2023-10-18 DIAGNOSIS — I10 Essential (primary) hypertension: Secondary | ICD-10-CM | POA: Diagnosis not present

## 2023-10-18 DIAGNOSIS — Z8673 Personal history of transient ischemic attack (TIA), and cerebral infarction without residual deficits: Secondary | ICD-10-CM | POA: Diagnosis not present

## 2023-10-18 DIAGNOSIS — F0150 Vascular dementia without behavioral disturbance: Secondary | ICD-10-CM | POA: Diagnosis not present

## 2023-10-18 DIAGNOSIS — E118 Type 2 diabetes mellitus with unspecified complications: Secondary | ICD-10-CM | POA: Diagnosis not present

## 2023-10-18 DIAGNOSIS — I679 Cerebrovascular disease, unspecified: Secondary | ICD-10-CM | POA: Diagnosis not present

## 2023-10-19 DIAGNOSIS — Z8673 Personal history of transient ischemic attack (TIA), and cerebral infarction without residual deficits: Secondary | ICD-10-CM | POA: Diagnosis not present

## 2023-10-19 DIAGNOSIS — I679 Cerebrovascular disease, unspecified: Secondary | ICD-10-CM | POA: Diagnosis not present

## 2023-10-19 DIAGNOSIS — I10 Essential (primary) hypertension: Secondary | ICD-10-CM | POA: Diagnosis not present

## 2023-10-19 DIAGNOSIS — F0150 Vascular dementia without behavioral disturbance: Secondary | ICD-10-CM | POA: Diagnosis not present

## 2023-10-19 DIAGNOSIS — E118 Type 2 diabetes mellitus with unspecified complications: Secondary | ICD-10-CM | POA: Diagnosis not present

## 2023-10-19 DIAGNOSIS — E785 Hyperlipidemia, unspecified: Secondary | ICD-10-CM | POA: Diagnosis not present

## 2023-10-20 DIAGNOSIS — E785 Hyperlipidemia, unspecified: Secondary | ICD-10-CM | POA: Diagnosis not present

## 2023-10-20 DIAGNOSIS — E118 Type 2 diabetes mellitus with unspecified complications: Secondary | ICD-10-CM | POA: Diagnosis not present

## 2023-10-20 DIAGNOSIS — F0150 Vascular dementia without behavioral disturbance: Secondary | ICD-10-CM | POA: Diagnosis not present

## 2023-10-20 DIAGNOSIS — Z8673 Personal history of transient ischemic attack (TIA), and cerebral infarction without residual deficits: Secondary | ICD-10-CM | POA: Diagnosis not present

## 2023-10-20 DIAGNOSIS — D519 Vitamin B12 deficiency anemia, unspecified: Secondary | ICD-10-CM | POA: Diagnosis not present

## 2023-10-20 DIAGNOSIS — I679 Cerebrovascular disease, unspecified: Secondary | ICD-10-CM | POA: Diagnosis not present

## 2023-10-20 DIAGNOSIS — E782 Mixed hyperlipidemia: Secondary | ICD-10-CM | POA: Diagnosis not present

## 2023-10-20 DIAGNOSIS — I2510 Atherosclerotic heart disease of native coronary artery without angina pectoris: Secondary | ICD-10-CM | POA: Diagnosis not present

## 2023-10-20 DIAGNOSIS — G8929 Other chronic pain: Secondary | ICD-10-CM | POA: Diagnosis not present

## 2023-10-20 DIAGNOSIS — E038 Other specified hypothyroidism: Secondary | ICD-10-CM | POA: Diagnosis not present

## 2023-10-20 DIAGNOSIS — E559 Vitamin D deficiency, unspecified: Secondary | ICD-10-CM | POA: Diagnosis not present

## 2023-10-20 DIAGNOSIS — I10 Essential (primary) hypertension: Secondary | ICD-10-CM | POA: Diagnosis not present

## 2023-10-20 DIAGNOSIS — G9340 Encephalopathy, unspecified: Secondary | ICD-10-CM | POA: Diagnosis not present

## 2023-10-20 DIAGNOSIS — R627 Adult failure to thrive: Secondary | ICD-10-CM | POA: Diagnosis not present

## 2023-10-21 DIAGNOSIS — I679 Cerebrovascular disease, unspecified: Secondary | ICD-10-CM | POA: Diagnosis not present

## 2023-10-21 DIAGNOSIS — F0150 Vascular dementia without behavioral disturbance: Secondary | ICD-10-CM | POA: Diagnosis not present

## 2023-10-21 DIAGNOSIS — E785 Hyperlipidemia, unspecified: Secondary | ICD-10-CM | POA: Diagnosis not present

## 2023-10-21 DIAGNOSIS — Z8673 Personal history of transient ischemic attack (TIA), and cerebral infarction without residual deficits: Secondary | ICD-10-CM | POA: Diagnosis not present

## 2023-10-21 DIAGNOSIS — E118 Type 2 diabetes mellitus with unspecified complications: Secondary | ICD-10-CM | POA: Diagnosis not present

## 2023-10-21 DIAGNOSIS — I10 Essential (primary) hypertension: Secondary | ICD-10-CM | POA: Diagnosis not present

## 2023-10-22 DIAGNOSIS — I679 Cerebrovascular disease, unspecified: Secondary | ICD-10-CM | POA: Diagnosis not present

## 2023-10-22 DIAGNOSIS — F0150 Vascular dementia without behavioral disturbance: Secondary | ICD-10-CM | POA: Diagnosis not present

## 2023-10-22 DIAGNOSIS — Z8673 Personal history of transient ischemic attack (TIA), and cerebral infarction without residual deficits: Secondary | ICD-10-CM | POA: Diagnosis not present

## 2023-10-22 DIAGNOSIS — E785 Hyperlipidemia, unspecified: Secondary | ICD-10-CM | POA: Diagnosis not present

## 2023-10-22 DIAGNOSIS — E118 Type 2 diabetes mellitus with unspecified complications: Secondary | ICD-10-CM | POA: Diagnosis not present

## 2023-10-22 DIAGNOSIS — I10 Essential (primary) hypertension: Secondary | ICD-10-CM | POA: Diagnosis not present

## 2023-10-23 DIAGNOSIS — F0150 Vascular dementia without behavioral disturbance: Secondary | ICD-10-CM | POA: Diagnosis not present

## 2023-10-23 DIAGNOSIS — I10 Essential (primary) hypertension: Secondary | ICD-10-CM | POA: Diagnosis not present

## 2023-10-23 DIAGNOSIS — I679 Cerebrovascular disease, unspecified: Secondary | ICD-10-CM | POA: Diagnosis not present

## 2023-10-23 DIAGNOSIS — Z8673 Personal history of transient ischemic attack (TIA), and cerebral infarction without residual deficits: Secondary | ICD-10-CM | POA: Diagnosis not present

## 2023-10-23 DIAGNOSIS — E785 Hyperlipidemia, unspecified: Secondary | ICD-10-CM | POA: Diagnosis not present

## 2023-10-23 DIAGNOSIS — E118 Type 2 diabetes mellitus with unspecified complications: Secondary | ICD-10-CM | POA: Diagnosis not present

## 2023-10-28 NOTE — Telephone Encounter (Signed)
 Please confirm if okay to archive Prolia  now. I have left messages & sent mycharts with no response.  See most recent discharge summary:  Expand All Collapse All            Physician Discharge Summary    Patient: Rita Taylor MRN: [id number]  DOB: 28-Oct-1940    Admit:     Date of Admission: 09/30/2023 Admitted from: home    Discharge: Date of discharge: 10/07/23 Disposition: Skilled nursing facility Condition at discharge: poor   CODE STATUS: DNR       Discharge Physician: Laneta Blunt, DO [facility]       PCP: Marylynn Verneita CROME, MD   Recommendations for Outpatient Follow-up:  Follow up with PCP / provider at facility / hospice

## 2023-10-31 ENCOUNTER — Encounter: Payer: Self-pay | Admitting: Internal Medicine

## 2023-10-31 DIAGNOSIS — Z66 Do not resuscitate: Secondary | ICD-10-CM | POA: Insufficient documentation

## 2023-10-31 NOTE — Telephone Encounter (Signed)
 Noted. Prolia  has been archived

## 2023-11-16 DEATH — deceased

## 2023-12-12 ENCOUNTER — Encounter: Payer: Self-pay | Admitting: Internal Medicine

## 2023-12-12 NOTE — Telephone Encounter (Signed)
 Chart has been updated.
